# Patient Record
Sex: Female | Born: 1940
Health system: Southern US, Community
[De-identification: ages and names within clinical notes are randomized; demographics above are authoritative.]

## PROBLEM LIST (undated history)

## (undated) DIAGNOSIS — I10 Essential (primary) hypertension: Secondary | ICD-10-CM

## (undated) DIAGNOSIS — Z9289 Personal history of other medical treatment: Secondary | ICD-10-CM

## (undated) DIAGNOSIS — G459 Transient cerebral ischemic attack, unspecified: Secondary | ICD-10-CM

## (undated) DIAGNOSIS — I480 Paroxysmal atrial fibrillation: Principal | ICD-10-CM

## (undated) DIAGNOSIS — M810 Age-related osteoporosis without current pathological fracture: Secondary | ICD-10-CM

## (undated) DIAGNOSIS — I340 Nonrheumatic mitral (valve) insufficiency: Secondary | ICD-10-CM

## (undated) DIAGNOSIS — G4730 Sleep apnea, unspecified: Secondary | ICD-10-CM

## (undated) DIAGNOSIS — R42 Dizziness and giddiness: Secondary | ICD-10-CM

## (undated) DIAGNOSIS — G4700 Insomnia, unspecified: Secondary | ICD-10-CM

## (undated) DIAGNOSIS — E7800 Pure hypercholesterolemia, unspecified: Secondary | ICD-10-CM

## (undated) DIAGNOSIS — J45909 Unspecified asthma, uncomplicated: Secondary | ICD-10-CM

## (undated) HISTORY — DX: Insomnia, unspecified: G47.00

## (undated) HISTORY — DX: Nonrheumatic mitral (valve) insufficiency: I34.0

## (undated) HISTORY — DX: Unspecified asthma, uncomplicated: J45.909

## (undated) HISTORY — DX: Age-related osteoporosis without current pathological fracture: M81.0

## (undated) HISTORY — DX: Dizziness and giddiness: R42

## (undated) HISTORY — PX: ABDOMINAL HYSTERECTOMY: SHX81

## (undated) HISTORY — DX: Transient cerebral ischemic attack, unspecified: G45.9

## (undated) HISTORY — DX: Pure hypercholesterolemia, unspecified: E78.00

## (undated) HISTORY — DX: Essential (primary) hypertension: I10

## (undated) HISTORY — DX: Personal history of other medical treatment: Z92.89

## (undated) HISTORY — PX: DIAGNOSTIC LAPAROSCOPY: SUR761

## (undated) HISTORY — DX: Paroxysmal atrial fibrillation: I48.0

---

## 2002-12-28 ENCOUNTER — Encounter: Payer: Self-pay | Admitting: Emergency Medicine

## 2002-12-28 ENCOUNTER — Emergency Department (HOSPITAL_COMMUNITY): Admission: EM | Admit: 2002-12-28 | Discharge: 2002-12-28 | Payer: Self-pay | Admitting: Emergency Medicine

## 2004-08-31 ENCOUNTER — Emergency Department: Payer: Self-pay | Admitting: Emergency Medicine

## 2004-09-23 ENCOUNTER — Ambulatory Visit: Payer: Self-pay | Admitting: Family Medicine

## 2005-04-06 ENCOUNTER — Ambulatory Visit: Payer: Self-pay | Admitting: Gastroenterology

## 2005-05-11 ENCOUNTER — Emergency Department: Payer: Self-pay | Admitting: Emergency Medicine

## 2005-05-26 ENCOUNTER — Encounter: Payer: Self-pay | Admitting: Family Medicine

## 2005-05-28 ENCOUNTER — Encounter: Payer: Self-pay | Admitting: Family Medicine

## 2005-06-28 ENCOUNTER — Encounter: Payer: Self-pay | Admitting: Family Medicine

## 2005-07-29 ENCOUNTER — Encounter: Payer: Self-pay | Admitting: Family Medicine

## 2005-08-09 ENCOUNTER — Emergency Department: Payer: Self-pay | Admitting: Unknown Physician Specialty

## 2005-11-17 ENCOUNTER — Emergency Department: Payer: Self-pay | Admitting: Emergency Medicine

## 2005-11-17 ENCOUNTER — Other Ambulatory Visit: Payer: Self-pay

## 2005-12-21 ENCOUNTER — Ambulatory Visit: Payer: Self-pay | Admitting: Family Medicine

## 2005-12-28 ENCOUNTER — Ambulatory Visit: Payer: Self-pay | Admitting: Family Medicine

## 2006-08-23 ENCOUNTER — Ambulatory Visit: Payer: Self-pay | Admitting: Family Medicine

## 2007-04-14 ENCOUNTER — Ambulatory Visit: Payer: Self-pay | Admitting: Family Medicine

## 2008-01-12 ENCOUNTER — Observation Stay (HOSPITAL_COMMUNITY): Admission: EM | Admit: 2008-01-12 | Discharge: 2008-01-13 | Payer: Self-pay | Admitting: Emergency Medicine

## 2008-03-14 ENCOUNTER — Ambulatory Visit: Payer: Self-pay | Admitting: Family Medicine

## 2008-11-26 ENCOUNTER — Ambulatory Visit: Payer: Self-pay | Admitting: Family Medicine

## 2009-02-26 ENCOUNTER — Ambulatory Visit: Payer: Self-pay | Admitting: Gastroenterology

## 2009-04-10 ENCOUNTER — Emergency Department (HOSPITAL_COMMUNITY): Admission: EM | Admit: 2009-04-10 | Discharge: 2009-04-10 | Payer: Self-pay | Admitting: Emergency Medicine

## 2009-05-29 ENCOUNTER — Ambulatory Visit: Payer: Self-pay | Admitting: Family Medicine

## 2010-06-03 ENCOUNTER — Ambulatory Visit: Payer: Self-pay | Admitting: Family Medicine

## 2010-07-31 ENCOUNTER — Ambulatory Visit: Payer: Self-pay | Admitting: Family Medicine

## 2010-08-10 ENCOUNTER — Ambulatory Visit: Payer: Self-pay | Admitting: Family Medicine

## 2010-10-01 LAB — CBC
HCT: 41 % (ref 36.0–46.0)
Hemoglobin: 13.9 g/dL (ref 12.0–15.0)
MCHC: 33.8 g/dL (ref 30.0–36.0)
MCV: 92.8 fL (ref 78.0–100.0)
Platelets: 300 10*3/uL (ref 150–400)
RBC: 4.42 MIL/uL (ref 3.87–5.11)
RDW: 12.9 % (ref 11.5–15.5)
WBC: 7.9 10*3/uL (ref 4.0–10.5)

## 2010-10-01 LAB — POCT I-STAT, CHEM 8
BUN: 19 mg/dL (ref 6–23)
Calcium, Ion: 1.21 mmol/L (ref 1.12–1.32)
Chloride: 106 meq/L (ref 96–112)
Creatinine, Ser: 1 mg/dL (ref 0.4–1.2)
Glucose, Bld: 116 mg/dL — ABNORMAL HIGH (ref 70–99)
HCT: 43 % (ref 36.0–46.0)
Hemoglobin: 14.6 g/dL (ref 12.0–15.0)
Potassium: 4.3 meq/L (ref 3.5–5.1)
Sodium: 142 meq/L (ref 135–145)
TCO2: 27 mmol/L (ref 0–100)

## 2010-10-01 LAB — DIFFERENTIAL
Basophils Absolute: 0 10*3/uL (ref 0.0–0.1)
Basophils Relative: 0 % (ref 0–1)
Eosinophils Absolute: 0.2 10*3/uL (ref 0.0–0.7)
Eosinophils Relative: 3 % (ref 0–5)
Lymphocytes Relative: 27 % (ref 12–46)
Lymphs Abs: 2.1 10*3/uL (ref 0.7–4.0)
Monocytes Absolute: 0.6 10*3/uL (ref 0.1–1.0)
Monocytes Relative: 8 % (ref 3–12)
Neutro Abs: 4.9 10*3/uL (ref 1.7–7.7)
Neutrophils Relative %: 63 % (ref 43–77)

## 2010-10-01 LAB — POCT CARDIAC MARKERS
CKMB, poc: 1.5 ng/mL (ref 1.0–8.0)
Myoglobin, poc: 137 ng/mL (ref 12–200)
Troponin i, poc: <0.05 ng/mL (ref 0.00–0.09)

## 2010-11-10 NOTE — H&P (Signed)
NAMEGIANNY, [name] NO.:  [id number]   MEDICAL RECORD NO.:  [id number]          PATIENT TYPE:  INP   LOCATION:  1850                         FACILITY:  [facility]   PHYSICIAN:  Corky Crafts, MDDATE OF BIRTH:  1940-12-08   DATE OF ADMISSION:  01/12/2008  DATE OF DISCHARGE:                              HISTORY & PHYSICAL   REASON FOR ADMISSION:  Chest pain, high blood pressure, and high  cholesterol.   HISTORY OF PRESENT ILLNESS:  The patient is a 70 year old woman with  risk factor for heart disease who was sitting down earlier today and had  episode of substernal chest pressure with shortness of breath.  She then  developed dizziness.  Her symptoms lasted about 5 minutes.  She had been  feeling well other than getting fatigued when she vacuumed.  She does  not do any other strenuous exercise.  Today, she did eat a spicy chicken  nugget just before this episode.  She said she had a stress test several  years ago, which is negative.  She does not have any symptoms recently.  Currently, she is chest pain free.   PAST MEDICAL HISTORY:  1. Hypertension.  2. High cholesterol.  3. Osteoporosis.  4. Hiatal hernia.   PAST SURGICAL HISTORY:  Hysterectomy, bladder and bowel obstruction.   ALLERGIES:  PENICILLIN.   MEDICATIONS:  1. Accupril 40 mg a day.  2. Norvasc.  3. HCTZ, unknown dosage.  4. Aspirin 81 mg a day.  5. Symbicort, unknown dose.   SOCIAL HISTORY:  The patient does not smoke, drink, or use caffeine.  She is retired.  She lives alone.   FAMILY HISTORY:  No early coronary artery disease.   REVIEW OF SYSTEMS:  Significant for left knee pain, which she has been  told that is related to arthritis.  No swelling.  No exertional chest  pain.  No fevers, chills, weight loss.  No focal weakness.  No edema.  All systems are negative.   PHYSICAL EXAM:  GENERAL:  She is awake and alert, in no apparent  distress.  VITAL SIGNS:  Blood pressure 140/70  and pulse 66.  NECK:  No JVD.  No carotid bruits.  CARDIOVASCULAR:  Regular rate and rhythm.  S1 and S2.  LUNGS:  Clear to auscultation bilaterally.  ABDOMEN:  Soft, nontender, and nondistended.  EXTREMITIES:  Shows no edema.  NEURO:  No focal deficits.  SKIN:  No rash.   LAB WORK:  Shows point-of-care troponins are negative x3.  EKG shows  normal sinus rhythm with no significant ST-T wave changes.  Creatinine  1.2.   ASSESSMENT AND PLAN:  A 70 year old with risk factors who had chest  pain.  1. Will rule out for myocardial infarction with enzymesand watch her      on telemetry. Continue aspirin.  2. Since she is pain free, we will not start any IV nitroglycerin at      this time.  We will start if pain persists.  3. Heparin only if her enzymes become positive.  4. She rules out plan for a stress test, possibly  as an outpatient      depending on her symptoms.      Corky Crafts, MD  Electronically Signed     JSV/MEDQ  D:  01/12/2008  T:  01/13/2008  Job:  [id number]

## 2011-03-26 LAB — TROPONIN I

## 2011-03-26 LAB — CBC
HCT: 43.2
Hemoglobin: 14.7
MCHC: 34
MCV: 91.9
Platelets: 309
RBC: 4.7
RDW: 13.1
WBC: 7.9

## 2011-03-26 LAB — DIFFERENTIAL
Basophils Absolute: 0
Basophils Relative: 1
Eosinophils Absolute: 0.2
Eosinophils Relative: 2
Lymphocytes Relative: 29
Lymphs Abs: 2.3
Monocytes Absolute: 0.9
Monocytes Relative: 11
Neutro Abs: 4.5
Neutrophils Relative %: 57

## 2011-03-26 LAB — POCT CARDIAC MARKERS
CKMB, poc: 2.3
CKMB, poc: 2.5
Myoglobin, poc: 139
Myoglobin, poc: 146
Operator id: 234501
Operator id: 272551
Troponin i, poc: <0.05
Troponin i, poc: <0.05

## 2011-03-26 LAB — POCT I-STAT, CHEM 8
BUN: 18
Calcium, Ion: 1.22
Chloride: 105
Creatinine, Ser: 1.1
Glucose, Bld: 84
HCT: 45
Hemoglobin: 15.3 — ABNORMAL HIGH
Potassium: 3.7
Sodium: 140
TCO2: 28

## 2011-03-26 LAB — CK TOTAL AND CKMB (NOT AT ARMC)
CK, MB: 1.7
CK, MB: 2.2
Relative Index: 1.2
Relative Index: 1.3
Total CK: 143
Total CK: 172

## 2011-09-09 ENCOUNTER — Ambulatory Visit: Payer: Self-pay | Admitting: Family Medicine

## 2012-11-14 ENCOUNTER — Ambulatory Visit: Payer: Self-pay | Admitting: Family Medicine

## 2012-12-25 ENCOUNTER — Observation Stay: Payer: Self-pay | Admitting: Internal Medicine

## 2012-12-25 LAB — CBC
HCT: 45.7 %
HGB: 15.8 g/dL
MCH: 30.8 pg
MCHC: 34.6 g/dL
MCV: 89 fL
Platelet: 325 10*3/uL
RBC: 5.13 X10 6/mm 3
RDW: 13.3 %
WBC: 8.9 10*3/uL

## 2012-12-25 LAB — BASIC METABOLIC PANEL WITH GFR
Anion Gap: 6 — ABNORMAL LOW
BUN: 30 mg/dL — ABNORMAL HIGH
Calcium, Total: 9.8 mg/dL
Chloride: 104 mmol/L
Co2: 27 mmol/L
Creatinine: 1.1 mg/dL
EGFR (African American): 59 — ABNORMAL LOW
EGFR (Non-African Amer.): 51 — ABNORMAL LOW
Glucose: 98 mg/dL
Osmolality: 280
Potassium: 3.7 mmol/L
Sodium: 137 mmol/L

## 2013-02-15 ENCOUNTER — Encounter: Payer: Self-pay | Admitting: *Deleted

## 2013-02-23 ENCOUNTER — Ambulatory Visit (INDEPENDENT_AMBULATORY_CARE_PROVIDER_SITE_OTHER): Payer: Medicare Other | Admitting: Cardiovascular Disease

## 2013-02-23 ENCOUNTER — Encounter: Payer: Self-pay | Admitting: Cardiovascular Disease

## 2013-02-23 VITALS — BP 110/80 | HR 52 | Ht 62.0 in | Wt 178.0 lb

## 2013-02-23 DIAGNOSIS — R0609 Other forms of dyspnea: Secondary | ICD-10-CM

## 2013-02-23 DIAGNOSIS — R0600 Dyspnea, unspecified: Secondary | ICD-10-CM

## 2013-02-23 DIAGNOSIS — R001 Bradycardia, unspecified: Secondary | ICD-10-CM

## 2013-02-23 DIAGNOSIS — R9431 Abnormal electrocardiogram [ECG] [EKG]: Secondary | ICD-10-CM | POA: Insufficient documentation

## 2013-02-23 DIAGNOSIS — I498 Other specified cardiac arrhythmias: Secondary | ICD-10-CM

## 2013-02-23 NOTE — Assessment & Plan Note (Signed)
Other than fatigue, she does not appear to be particularly symptomatic from this. I reviewed her recent labs which were unremarkable including normal thyroid function and BNP. I recommend avoiding medications that can cause bradycardia. No indication to treat this unless symptomatic. Rule out underlying ischemic cause. Heart rate response to exercise will be evaluated as well during stress test.

## 2013-02-23 NOTE — Progress Notes (Signed)
HPI  This is a pleasant 72 year old female who was referred by Dr. Thana Ates for evaluation of an abnormal EKG and bradycardia. She has no previous cardiac history. She has prolonged history of hypertension well controlled on medications. She also has history of mild hyperlipidemia. There is no history of tobacco use and a family history of premature coronary artery disease. She was hospitalized in June of this year due to angioedema thought to be related to ACE inhibitor. She was taken off the medication and continued on hydrochlorothiazide and amlodipine. Upon followup, she was noted to be bradycardic with a heart rate of 46 beats per minute. The patient is not on any medication that can cause bradycardia. She denies any chest discomfort. However, she has noticed progressive exertional fatigue with mild dyspnea but no chest discomfort. She denies any dizziness, syncope or presyncope.  Allergies  Allergen Reactions  . Ace Inhibitors   . Penicillins      Current Outpatient Prescriptions on File Prior to Visit  Medication Sig Dispense Refill  . albuterol (PROAIR HFA) 108 (90 BASE) MCG/ACT inhaler Inhale 2 puffs into the lungs every 6 (six) hours as needed for wheezing.      [name] amLODipine (NORVASC) 10 MG tablet Take 10 mg by mouth daily.      [name] aspirin 81 MG tablet Take 81 mg by mouth daily.      . budesonide-formoterol (SYMBICORT) 160-4.5 MCG/ACT inhaler Inhale 2 puffs into the lungs 2 (two) times daily.      . hydrochlorothiazide (HYDRODIURIL) 25 MG tablet Take 25 mg by mouth daily.      . meclizine (ANTIVERT) 25 MG tablet Take 25 mg by mouth every 8 (eight) hours as needed.       No current facility-administered medications on file prior to visit.     Past Medical History  Diagnosis Date  . Hypertension   . Unspecified transient cerebral ischemia   . Acute bronchitis   . Pure hypercholesterolemia   . Osteoporosis, unspecified   . Unspecified asthma(493.90)   . Insomnia,  unspecified   . Chronic kidney disease (CKD), stage III (moderate)      Past Surgical History  Procedure Laterality Date  . Abdominal hysterectomy       Family History  Problem Relation Age of Onset  . COPD Father   . Hypertension Mother   . Hypertension Brother      History   Social History  . Marital Status: Single    Spouse Name: N/A    Number of Children: N/A  . Years of Education: N/A   Occupational History  . Not on file.   Social History Main Topics  . Smoking status: Never Smoker   . Smokeless tobacco: Not on file  . Alcohol Use: No  . Drug Use: No  . Sexual Activity: Not on file   Other Topics Concern  . Not on file   Social History Narrative  . No narrative on file     ROS Constitutional: Negative for fever, chills, diaphoresis, activity change, appetite change . HENT: Negative for hearing loss, nosebleeds, congestion, sore throat, facial swelling, drooling, trouble swallowing, neck pain, voice change, sinus pressure and tinnitus.  Eyes: Negative for photophobia, pain, discharge and visual disturbance.  Respiratory: Negative for apnea, cough, chest tightness and wheezing.  Cardiovascular: Negative for chest pain, palpitations and leg swelling.  Gastrointestinal: Negative for nausea, vomiting, abdominal pain, diarrhea, constipation, blood in stool and abdominal distention.  Genitourinary: Negative for dysuria,  urgency, frequency, hematuria and decreased urine volume.  Musculoskeletal: Negative for myalgias, back pain, joint swelling, arthralgias and gait problem.  Skin: Negative for color change, pallor, rash and wound.  Neurological: Negative for dizziness, tremors, seizures, syncope, speech difficulty, weakness, light-headedness, numbness and headaches.  Psychiatric/Behavioral: Negative for suicidal ideas, hallucinations, behavioral problems and agitation. The patient is not nervous/anxious.   '  PHYSICAL EXAM   BP 110/80  Pulse 52  Ht 5\' 2"   (1.575 m)  Wt 178 lb (80.74 kg)  BMI 32.55 kg/m2 Constitutional: She is oriented to person, place, and time. She appears well-developed and well-nourished. No distress.  HENT: No nasal discharge.  Head: Normocephalic and atraumatic.  Eyes: Pupils are equal and round. Right eye exhibits no discharge. Left eye exhibits no discharge.  Neck: Normal range of motion. Neck supple. No JVD present. No thyromegaly present.  Cardiovascular: Normal rate, regular rhythm, normal heart sounds. Exam reveals no gallop and no friction rub. No murmur heard.  Pulmonary/Chest: Effort normal and breath sounds normal. No stridor. No respiratory distress. She has no wheezes. She has no rales. She exhibits no tenderness.  Abdominal: Soft. Bowel sounds are normal. She exhibits no distension. There is no tenderness. There is no rebound and no guarding.  Musculoskeletal: Normal range of motion. She exhibits no edema and no tenderness.  Neurological: She is alert and oriented to person, place, and time. Coordination normal.  Skin: Skin is warm and dry. No rash noted. She is not diaphoretic. No erythema. No pallor.  Psychiatric: She has a normal mood and affect. Her behavior is normal. Judgment and thought content normal.     GNF:AOZHY  Bradycardia  -Anteroseptal infarct -age undetermined.   ABNORMAL    ASSESSMENT AND PLAN

## 2013-02-23 NOTE — Patient Instructions (Addendum)
Your physician has requested that you have en exercise stress myoview. For further information please visit [email or web address] Please follow instruction sheet, as given.  Follow up as needed.   [facility] MYOVIEW  Your caregiver has ordered a Stress Test with nuclear imaging. The purpose of this test is to evaluate the blood supply to your heart muscle. This procedure is referred to as a "Non-Invasive Stress Test." This is because other than having an IV started in your vein, nothing is inserted or "invades" your body. Cardiac stress tests are done to find areas of poor blood flow to the heart by determining the extent of coronary artery disease (CAD). Some patients exercise on a treadmill, which naturally increases the blood flow to your heart, while others who are  unable to walk on a treadmill due to physical limitations have a pharmacologic/chemical stress agent called Lexiscan . This medicine will mimic walking on a treadmill by temporarily increasing your coronary blood flow.   Please note: these test may take anywhere between 2-4 hours to complete  PLEASE REPORT TO [facility] MEDICAL MALL ENTRANCE  THE VOLUNTEERS AT THE FIRST DESK WILL DIRECT YOU WHERE TO GO  Date of Procedure:__________Wednesday, September 3___________________________  Arrival Time for Procedure:__________7:15____________________  Instructions regarding medication:   PLEASE NOTIFY THE OFFICE AT LEAST 24 HOURS IN ADVANCE IF YOU ARE UNABLE TO KEEP YOUR APPOINTMENT.  [phone] AND  PLEASE NOTIFY NUCLEAR MEDICINE AT [facility] AT LEAST 24 HOURS IN ADVANCE IF YOU ARE UNABLE TO KEEP YOUR APPOINTMENT. [phone]  How to prepare for your Myoview test:  1. Do not eat or drink after midnight 2. No caffeine for 24 hours prior to test 3. No smoking 24 hours prior to test. 4. Your medication may be taken with water.  If your doctor stopped a medication because of this test, do not take that medication. 5. Ladies, please do not wear  dresses.  Skirts or pants are appropriate. Please wear a short sleeve shirt. 6. No perfume, cologne or lotion. 7. Wear comfortable walking shoes. No heels!

## 2013-02-23 NOTE — Assessment & Plan Note (Signed)
The patient's EKG is suggestive of prior anteroseptal infarct with prominent Q waves in V1 and V2 and nonspecific ST changes. Her symptoms include exertional fatigue and mild dyspnea without chest pain. I recommend further evaluation with a treadmill nuclear stress test. Given her abnormal resting EKG, I don't think a treadmill stress test done on would be sufficient.

## 2013-02-28 ENCOUNTER — Ambulatory Visit: Payer: Self-pay | Admitting: Cardiovascular Disease

## 2013-03-01 ENCOUNTER — Other Ambulatory Visit: Payer: Self-pay

## 2013-03-01 DIAGNOSIS — R0600 Dyspnea, unspecified: Secondary | ICD-10-CM

## 2013-03-01 DIAGNOSIS — R9431 Abnormal electrocardiogram [ECG] [EKG]: Secondary | ICD-10-CM

## 2013-03-02 ENCOUNTER — Telehealth: Payer: Self-pay

## 2013-03-02 NOTE — Telephone Encounter (Signed)
Spoke w/ pt.  She is aware of results and will continue exercising.

## 2013-03-02 NOTE — Telephone Encounter (Signed)
Message copied by Marilynne Halsted on Fri Mar 02, 2013 11:32 AM ------      Message from: Lorine Bears A      Created: Fri Mar 02, 2013  9:48 AM       Inform patient that  stress test was normal.       Heart rate response to exercise was normal.       Send a copy to Dr. Thana Ates. ------

## 2013-03-02 NOTE — Telephone Encounter (Signed)
Message copied by Marilynne Halsted on Fri Mar 02, 2013  4:38 PM ------      Message from: Lorine Bears A      Created: Fri Mar 02, 2013  9:48 AM       Inform patient that  stress test was normal.       Heart rate response to exercise was normal.       Send a copy to Dr. Thana Ates. ------

## 2013-05-16 ENCOUNTER — Ambulatory Visit: Payer: Self-pay | Admitting: Family Medicine

## 2013-05-21 ENCOUNTER — Ambulatory Visit: Payer: Self-pay | Admitting: Family Medicine

## 2013-12-18 ENCOUNTER — Ambulatory Visit: Payer: Self-pay | Admitting: Family Medicine

## 2014-08-22 DIAGNOSIS — Z23 Encounter for immunization: Secondary | ICD-10-CM | POA: Diagnosis not present

## 2014-10-11 ENCOUNTER — Other Ambulatory Visit: Payer: Self-pay | Admitting: Family Medicine

## 2014-10-11 DIAGNOSIS — Z1231 Encounter for screening mammogram for malignant neoplasm of breast: Secondary | ICD-10-CM

## 2014-10-18 NOTE — Discharge Summary (Signed)
PATIENT NAME:  [name], DORIAN MR#:  [id number] DATE OF BIRTH:  1941/03/02  DATE OF ADMISSION:  12/25/2012 DATE OF DISCHARGE:  12/26/2012  ADMITTING PHYSICIAN: Gladstone Lighter, MD.  DISCHARGING PHYSICIAN: Dr. Gladstone Lighter, M.D.   PRIMARY CARE PHYSICIAN: Dr. Ashok Norris.  Mead: None.   DISCHARGE DIAGNOSES:  1.  Angioedema likely secondary to quinapril.  2.  Hypertension.   DISCHARGE HOME MEDICATIONS:  1.  Hydrochlorothiazide 25 mg p.o. daily.  2.  Norvasc 10 mg p.o. daily.  3.  Benadryl 25 mg p.o. q.6 hours p.r.n. for itching and allergic reaction.  4.  Prednisone taper.   DISCHARGE DIET: Low-sodium diet.   DISCHARGE ACTIVITY: As tolerated.    FOLLOWUP INSTRUCTIONS: Advised to stop taking quinapril or other ACE inhibitors and advised to follow up with PCP and 5 to 6 days.   LABS AND IMAGING STUDIES PRIOR TO DISCHARGE: WBC 8.9, hemoglobin is 15.8, hematocrit 45.7, platelet count 325. Sodium 137, potassium 3.7, chloride 104, bicarbonate 27, BUN 30, creatinine 1.1, glucose 98 and calcium of 9.8.  BRIEF HOSPITAL COURSE: Ms. [name] is a 74 year old African American female with past medical history significant for hypertension, who had been on quinapril for more than 10 years now. She woke up with severe tongue swelling, which got worse to the point that she could not breathe or  could not swallow and came to the ER.  1.  Angioedema likely secondary to quinapril. She was given Solu-Medrol. Her symptoms started improving. She was monitored to the in CCU. She refused any fresh frozen plasma and since her symptoms were improving it was not give either way. She was on Pepcid, Benadryl p.r.n. and also Solu-Medrol while in the hospital. Her quinapril was stopped and she was advised not to take any ACE inhibitors. Her blood pressure was better controlled on Norvasc and hydrochlorothiazide, so she is being discharged on the 2 and will follow up with her PCP in  the next week Her course has been otherwise uneventful in the hospital. Her swelling improved. She was initially only on a liquid diet and she is able to tolerate a regular diet and ambulate. No stridor. No dysphagia and is being discharged home.   DISCHARGE CONDITION: Stable.   DISCHARGE DISPOSITION: Home.   TIME SPENT ON DISCHARGE: 40 minutes.   ____________________________ Gladstone Lighter, MD rk:aw D: 12/26/2012 15:09:15 ET T: 12/26/2012 15:33:17 ET JOB#: [id number]  cc: Gladstone Lighter, MD, <Dictator> Ashok Norris, MD Gladstone Lighter MD ELECTRONICALLY SIGNED 01/22/2013 13:16

## 2014-10-18 NOTE — H&P (Signed)
PATIENT NAME:  [name], [name] MR#:  [id number] DATE OF BIRTH:  05/25/1941  DATE OF ADMISSION:  12/25/2012  ADMITTING PHYSICIAN:  Gladstone Lighter,  MD   PRIMARY CARE PHYSICIAN:  Ashok Norris, MD  CHIEF COMPLAINT:  Swelling of her throat and tongue.   HISTORY OF PRESENT ILLNESS:  [name] is a 74 year old African American female with a past medical history significant for hypertension who has been taking Norvasc, hydrochlorothiazide and quinapril for several years now comes to the hospital secondary to noticing swelling of her throat and tongue this morning. The patient denies any recent travel, eating any seafood or any outside food yesterday. She felt fine all day yesterday and went to bed. This morning, she woke up and felt her mouth was odd and her tongue was swollen. She went for her 1 mile walk. She came back, tried to drink something and she felt that she could not swallow due to her tongue being extremely swollen. Her lips were very mildly swollen. At the time, she was concerned and comes to the ER. Blood work looks good. Likely she has angioedema related to her ACE inhibitor that she has been on and she is being admitted under observation for the same.   PAST MEDICAL HISTORY:  Hypertension.   PAST SURGICAL HISTORY:  Hysterectomy.   ALLERGIES TO MEDICATIONS:   1.  PENICILLIN.  2.  ACE INHIBITORS FROM TODAY.  CURRENT HOME MEDICATIONS:   1.  Norvasc 10 mg p.o. daily.  2.  HCTZ 25 mg p.o. daily.  3.  Quinapril 40 mg p.o. daily.   SOCIAL HISTORY:  Lives at home by herself. Active at baseline. No history of any smoking or alcohol use.   FAMILY HISTORY:  Dad died from COPD complications and respiratory failure. Unable to be weaned off vent after surgery for his gallbladder and family history of stroke.   REVIEW OF SYSTEMS:  CONSTITUTIONAL: No fatigue, fever or weakness.  EYES: No blurred vision, double vision, glaucoma or cataracts. Uses reading glasses.  ENT: No tinnitus,  ear pain, hearing loss, epistaxis or discharge. Mild dysphagia this morning. No stridor noted.  RESPIRATORY: No cough, wheeze, hemoptysis or COPD.  CARDIOVASCULAR: No chest pain, orthopnea, edema, arrhythmia, palpitations or syncope.  GASTROINTESTINAL: No nausea, vomiting, diarrhea, abdominal pain, hematemesis or melena.  GENITOURINARY: No dysuria, hematuria, renal calculus, frequency or incontinence.  ENDOCRINE: No polyuria, nocturia, thyroid problems, heat or cold intolerance.  HEMATOLOGY: No anemia, easy bruising or bleeding.  SKIN: No acne, rash or lesions.  MUSCULOSKELETAL: No neck, back or shoulder pain, no arthritis or gout.  NEUROLOGIC: No numbness, weakness, CVA, TIA or seizures.  PSYCHOLOGICAL: No anxiety, insomnia, depression.   PHYSICAL EXAMINATION: VITAL SIGNS: Temperature 97.9 degrees Fahrenheit, pulse 92, respirations 18, blood pressure 136/71, pulse oximetry 97% on room air.  GENERAL: Well-built, well-nourished female sitting in bed, not in any acute distress.  HEENT: Normocephalic, atraumatic. Pupils equal, round, reacting to light. Anicteric sclerae. Extraocular movements intact. Oropharynx with still a large tongue, but posterior pharyngeal wall, the soft palate and uvula do not look edematous at this time. Lips also do not look edematous at this time. No erythema, mass or exudates.  NECK: Supple, no thyromegaly, JVD or carotid bruits. No lymphadenopathy. No stridor on exam.  LUNGS: Moving air bilaterally. No wheeze or crackles. No use of accessory muscles for breathing. Clear breath sounds bilaterally.  CARDIOVASCULAR: S1, S2 regular rate and rhythm. No murmurs, rubs or gallops.  ABDOMEN: Soft, nontender, nondistended. No hepatosplenomegaly.  Normal bowel sounds.  EXTREMITIES: No pedal edema. No clubbing or cyanosis, 2+ dorsalis pedis pulses palpable bilaterally.  SKIN: No acne, rash or lesions.  MUSCULOSKELETAL: No joint tenderness or effusion.  NEUROLOGIC: Cranial nerves  intact, 2+ deep tendon reflexes symmetrically both upper and lower extremities present. Strength is 5 out of 5 in all 4 extremities and sensation is intact.  PSYCHOLOGICAL: The patient is awake, alert, oriented x 3.   DIAGNOSTIC DATA:   1.  WBC 8.9, hemoglobin 15.8, hematocrit 45.7, platelet count 325.  2.  Sodium 139, potassium 3.7, chloride 104, bicarbonate 27, BUN 30, creatinine 1.1, glucose 98 and calcium of 9.8.  3.  EKG normal sinus rhythm.   ASSESSMENT AND PLAN:  A 74 year old female with a history of hypertension admitted for angioedema.  1.  Angioedema likely from her Quinapril, although she has been taking it for several years now. start on intravenous Solu-Medrol, Benadryl and Pepcid and observe for 24 hours. This is for histamine-related angioedema. If it is purely ACEI related edema, the above medications might not help and fresh frozen plasma will help because of the ACE enzyme it carries and decreases the bradykinins. However, since her swelling is not progressing and the patient does not want to get any plasma products at this time, we will monitor for now and hold off on transfusing fresh frozen plasma. She is on a clear-liquid diet and advanced to full liquid later today and if her swelling improves, we will change to soft diet tomorrow.  2.  Hypertension. Hold her quinapril. Continue Norvasc and hydrochlorothiazide.  3.  Gastrointestinal and deep vein thrombosis prophylaxis on Protonix and Lovenox.   CODE STATUS:  Full code.   TIME SPENT ON ADMISSION:  50 minutes.    ____________________________ Gladstone Lighter, MD rk:si D: 12/25/2012 15:51:00 ET T: 12/25/2012 16:32:46 ET JOB#: [id number]  cc: Gladstone Lighter, MD, <Dictator> Ashok Norris, MD Gladstone Lighter MD ELECTRONICALLY SIGNED 01/22/2013 13:16

## 2014-10-29 DIAGNOSIS — Z87448 Personal history of other diseases of urinary system: Secondary | ICD-10-CM | POA: Diagnosis not present

## 2014-10-29 DIAGNOSIS — R7309 Other abnormal glucose: Secondary | ICD-10-CM | POA: Diagnosis not present

## 2014-10-29 DIAGNOSIS — E780 Pure hypercholesterolemia: Secondary | ICD-10-CM | POA: Diagnosis not present

## 2014-10-29 DIAGNOSIS — I10 Essential (primary) hypertension: Secondary | ICD-10-CM | POA: Diagnosis not present

## 2014-12-20 ENCOUNTER — Other Ambulatory Visit: Payer: Self-pay | Admitting: Family Medicine

## 2014-12-20 ENCOUNTER — Ambulatory Visit
Admission: RE | Admit: 2014-12-20 | Discharge: 2014-12-20 | Disposition: A | Discharge status: Home / Self Care | Payer: Medicare Other | Source: Ambulatory Visit | Attending: Family Medicine | Admitting: Family Medicine

## 2014-12-20 DIAGNOSIS — Z1231 Encounter for screening mammogram for malignant neoplasm of breast: Secondary | ICD-10-CM

## 2014-12-27 ENCOUNTER — Telehealth: Payer: Self-pay | Admitting: Family Medicine

## 2014-12-27 NOTE — Telephone Encounter (Signed)
Pt called to schedule an appt to discuss her dense breast tissue, however she began as a [name] pt, but has seen Nadine Counts a few times per pt and has a follow up scheduled in September with Sundaram. Has Dr. Nadine Counts accepted [name] as her pt and who should I schedule the exam for the breast tissue with. Please advise. Pt stated that she was fine either way.

## 2014-12-27 NOTE — Telephone Encounter (Signed)
If Mrs [name] would like to switch her PCP from Dr. Rutherford Nail to me please go ahead and schedule her with me and list the PCP on file to Dr. Nadine Counts. Thank you.

## 2014-12-31 NOTE — Telephone Encounter (Signed)
Please call pt. Thank you.

## 2015-01-02 ENCOUNTER — Encounter: Payer: Self-pay | Admitting: Family Medicine

## 2015-01-02 ENCOUNTER — Ambulatory Visit (INDEPENDENT_AMBULATORY_CARE_PROVIDER_SITE_OTHER): Payer: Medicare Other | Admitting: Family Medicine

## 2015-01-02 VITALS — BP 130/70 | HR 96 | Temp 97.8°F | Resp 16 | Ht 61.0 in | Wt 180.4 lb

## 2015-01-02 DIAGNOSIS — R9230 Dense breasts, unspecified: Secondary | ICD-10-CM | POA: Insufficient documentation

## 2015-01-02 DIAGNOSIS — R922 Inconclusive mammogram: Secondary | ICD-10-CM | POA: Diagnosis not present

## 2015-01-02 DIAGNOSIS — K635 Polyp of colon: Secondary | ICD-10-CM | POA: Diagnosis not present

## 2015-01-02 NOTE — Progress Notes (Signed)
Name: [name]   MRN: [id number]    DOB: March 12, 1941   Date:01/02/2015       Progress Note  Subjective  Chief Complaint  Chief Complaint  Patient presents with  . Breast Problem    discuss mammogram findings    HPI  Dense breast  Patient has a history of a mammograms from breast densities. The radiologist states that this may height some underlying breast disease. She has a bit of a concern about possibility of being some underlying wrist lesion. There is a family history of bruits densities but no breast cancer. Approximately 15-20 minutes was spent discussing this with patient.  Colonic polyps  Patient has a history of colonoscopy in 2000 and the right showing poor colonic polyps as well as diverticulosis. She currently has no abdominal pain no weight loss no melena or hematochezia.  Past Medical History  Diagnosis Date  . Hypertension   . Unspecified transient cerebral ischemia   . Acute bronchitis   . Pure hypercholesterolemia   . Osteoporosis, unspecified   . Unspecified asthma(493.90)   . Insomnia, unspecified   . Chronic kidney disease (CKD), stage III (moderate)     History  Substance Use Topics  . Smoking status: Never Smoker   . Smokeless tobacco: Never Used  . Alcohol Use: No     Current outpatient prescriptions:  .  amLODipine (NORVASC) 10 MG tablet, Take 10 mg by mouth daily., Disp: , Rfl:  .  aspirin 81 MG tablet, Take 81 mg by mouth daily., Disp: , Rfl:  .  hydrochlorothiazide (HYDRODIURIL) 25 MG tablet, Take 25 mg by mouth daily., Disp: , Rfl:  .  meclizine (ANTIVERT) 25 MG tablet, Take 25 mg by mouth every 8 (eight) hours as needed., Disp: , Rfl:  .  albuterol (PROAIR HFA) 108 (90 BASE) MCG/ACT inhaler, Inhale 2 puffs into the lungs every 6 (six) hours as needed for wheezing., Disp: , Rfl:  .  budesonide-formoterol (SYMBICORT) 160-4.5 MCG/ACT inhaler, Inhale 2 puffs into the lungs 2 (two) times daily., Disp: , Rfl:   Allergies  Allergen  Reactions  . Ace Inhibitors   . Penicillins Anxiety    Review of Systems  Constitutional: Negative for fever, chills and weight loss.  HENT: Negative for congestion, hearing loss, sore throat and tinnitus.   Eyes: Negative for blurred vision, double vision and redness.  Respiratory: Negative for cough, hemoptysis and shortness of breath.   Cardiovascular: Positive for chest pain (patient complains ofsome occasional sharp discomfort in the breast. There is a history of density of the mammogram. There is no family history of breast cancer.). Negative for palpitations, orthopnea, claudication and leg swelling.  Gastrointestinal: Negative for heartburn, nausea, vomiting, diarrhea, constipation and blood in stool.       History of colonic polyp  Genitourinary: Negative for dysuria, urgency, frequency and hematuria.  Musculoskeletal: Negative for myalgias, back pain, joint pain, falls and neck pain.  Skin: Negative for itching.  Neurological: Negative for dizziness, tingling, tremors, focal weakness, seizures, loss of consciousness, weakness and headaches.  Endo/Heme/Allergies: Does not bruise/bleed easily.  Psychiatric/Behavioral: Negative for depression and substance abuse. The patient is not nervous/anxious and does not have insomnia.      Objective  Filed Vitals:   01/02/15 1501  BP: 130/70  Pulse: 96  Temp: 97.8 F (36.6 C)  TempSrc: Oral  Resp: 16  Height: 5\' 1"  (1.549 m)  Weight: 180 lb 6.4 oz (81.829 kg)  SpO2: 96%  Physical Exam  Constitutional: She is well-developed, well-nourished, and in no distress.  HENT:  Head: Normocephalic.  Eyes: Pupils are equal, round, and reactive to light.  Psychiatric: Mood, memory, affect and judgment normal.      Assessment & Plan  1. Inconclusive mammogram due to dense breasts Approximately 20-30 minutes spent in discussion regarding the mammogram and colonic polyp history  2. Dense breast Reassurance of benign nature  3.  Colonic polyp Scheduled for repeat colonoscopy

## 2015-01-02 NOTE — Patient Instructions (Signed)
*  Healthy as very. We will: R days before the 11th He is In log book to female was seen couple weeks is instructed Obesity Obesity is defined as having too much total body fat and a body mass index (BMI) of 30 or more. BMI is an estimate of body fat and is calculated from your height and weight. Obesity happens when you consume more calories than you can burn by exercising or performing daily physical tasks. Prolonged obesity can cause major illnesses or emergencies, such as:   Stroke.  Heart disease.  Diabetes.  Cancer.  Arthritis.  High blood pressure (hypertension).  High cholesterol.  Sleep apnea.  Erectile dysfunction.  Infertility problems. CAUSES   Regularly eating unhealthy foods.  Physical inactivity.  Certain disorders, such as an underactive thyroid (hypothyroidism), Cushing's syndrome, and polycystic ovarian syndrome.  Certain medicines, such as steroids, some depression medicines, and antipsychotics.  Genetics.  Lack of sleep. DIAGNOSIS  A health care provider can diagnose obesity after calculating your BMI. Obesity will be diagnosed if your BMI is 30 or higher.  There are other methods of measuring obesity levels. Some other methods include measuring your skinfold thickness, your waist circumference, and comparing your hip circumference to your waist circumference. TREATMENT  A healthy treatment program includes some or all of the following:  Long-term dietary changes.  Exercise and physical activity.  Behavioral and lifestyle changes.  Medicine only under the supervision of your health care provider. Medicines may help, but only if they are used with diet and exercise programs. An unhealthy treatment program includes:  Fasting.  Fad diets.  Supplements and drugs. These choices do not succeed in long-term weight control.  HOME CARE INSTRUCTIONS   Exercise and perform physical activity as directed by your health care provider. To increase physical  activity, try the following:  Use stairs instead of elevators.  Park farther away from store entrances.  Garden, bike, or walk instead of watching television or using the computer.  Eat healthy, low-calorie foods and drinks on a regular basis. Eat more fruits and vegetables. Use low-calorie cookbooks or take healthy cooking classes.  Limit fast food, sweets, and processed snack foods.  Eat smaller portions.  Keep a daily journal of everything you eat. There are many free websites to help you with this. It may be helpful to measure your foods so you can determine if you are eating the correct portion sizes.  Avoid drinking alcohol. Drink more water and drinks without calories.  Take vitamins and supplements only as recommended by your health care provider.  Weight-loss support groups, Tax adviser, counselors, and stress reduction education can also be very helpful. SEEK IMMEDIATE MEDICAL CARE IF:  You have chest pain or tightness.  You have trouble breathing or feel short of breath.  You have weakness or leg numbness.  You feel confused or have trouble talking.  You have sudden changes in your vision. MAKE SURE YOU:  Understand these instructions.  Will watch your condition.  Will get help right away if you are not doing well or get worse. Document Released: 07/22/2004 Document Revised: 10/29/2013 Document Reviewed: 07/21/2011 [facility] Patient Information 2015 [place], [place]. This information is not intended to replace advice given to you by your health care provider. Make sure you discuss any questions you have with your health care provider.

## 2015-01-03 NOTE — Telephone Encounter (Signed)
Spoke with the patient and she will let me know which provider she will stay with.

## 2015-01-09 ENCOUNTER — Telehealth: Payer: Self-pay | Admitting: Gastroenterology

## 2015-01-09 ENCOUNTER — Other Ambulatory Visit: Payer: Self-pay

## 2015-01-09 NOTE — Telephone Encounter (Signed)
Gastroenterology Pre-Procedure Review  Request Date: 01-27-2015 Requesting Physician: Dr. Allen Norris  PATIENT REVIEW QUESTIONS: The patient responded to the following health history questions as indicated:    1. Are you having any GI issues? no 2. Do you have a personal history of Polyps? yes (Polyps) 3. Do you have a family history of Colon Cancer or Polyps? yes (DAD) 4. Diabetes Mellitus? no 5. Joint replacements in the past 12 months?no 6. Major health problems in the past 3 months?no 7. Any artificial heart valves, MVP, or defibrillator?no    MEDICATIONS & ALLERGIES:    Patient reports the following regarding taking any anticoagulation/antiplatelet therapy:   Plavix, Coumadin, Eliquis, Xarelto, Lovenox, Pradaxa, Brilinta, or Effient? no Aspirin? yes (Daily 81mg )  Patient confirms/reports the following medications:  Current Outpatient Prescriptions  Medication Sig Dispense Refill   albuterol (PROAIR HFA) 108 (90 BASE) MCG/ACT inhaler Inhale 2 puffs into the lungs every 6 (six) hours as needed for wheezing.     amLODipine (NORVASC) 10 MG tablet Take 10 mg by mouth daily.     aspirin 81 MG tablet Take 81 mg by mouth daily.     budesonide-formoterol (SYMBICORT) 160-4.5 MCG/ACT inhaler Inhale 2 puffs into the lungs 2 (two) times daily.     hydrochlorothiazide (HYDRODIURIL) 25 MG tablet Take 25 mg by mouth daily.     meclizine (ANTIVERT) 25 MG tablet Take 25 mg by mouth every 8 (eight) hours as needed.     No current facility-administered medications for this visit.    Patient confirms/reports the following allergies:  Allergies  Allergen Reactions   Ace Inhibitors    Penicillins Anxiety    No orders of the defined types were placed in this encounter.    AUTHORIZATION INFORMATION Primary Insurance: 1D#: Group #:  Secondary Insurance: 1D#: Group #:  SCHEDULE INFORMATION: Date: 01-27-2015 Time: Location:[facility]

## 2015-01-22 NOTE — Discharge Instructions (Signed)

## 2015-01-23 ENCOUNTER — Other Ambulatory Visit: Payer: Self-pay

## 2015-01-27 ENCOUNTER — Ambulatory Visit: Admission: RE | Admit: 2015-01-27 | Payer: Medicare Other | Source: Ambulatory Visit | Admitting: Gastroenterology

## 2015-01-27 ENCOUNTER — Encounter: Admission: RE | Payer: Self-pay | Source: Ambulatory Visit

## 2015-01-27 SURGERY — COLONOSCOPY WITH PROPOFOL
Anesthesia: Choice

## 2015-02-20 ENCOUNTER — Ambulatory Visit (INDEPENDENT_AMBULATORY_CARE_PROVIDER_SITE_OTHER): Payer: Medicare Other | Admitting: Family Medicine

## 2015-02-20 ENCOUNTER — Encounter: Payer: Self-pay | Admitting: Family Medicine

## 2015-02-20 VITALS — BP 140/86 | HR 71 | Temp 97.9°F | Resp 16 | Ht 61.0 in | Wt 184.3 lb

## 2015-02-20 DIAGNOSIS — I10 Essential (primary) hypertension: Secondary | ICD-10-CM | POA: Diagnosis not present

## 2015-02-20 DIAGNOSIS — R739 Hyperglycemia, unspecified: Secondary | ICD-10-CM

## 2015-02-20 DIAGNOSIS — Z23 Encounter for immunization: Secondary | ICD-10-CM

## 2015-02-20 DIAGNOSIS — J4520 Mild intermittent asthma, uncomplicated: Secondary | ICD-10-CM | POA: Diagnosis not present

## 2015-02-20 DIAGNOSIS — R42 Dizziness and giddiness: Secondary | ICD-10-CM

## 2015-02-20 LAB — POCT GLYCOSYLATED HEMOGLOBIN (HGB A1C): Hemoglobin A1C: 6

## 2015-02-20 LAB — GLUCOSE, POCT (MANUAL RESULT ENTRY): POC Glucose: 102 mg/dL — AB (ref 70–99)

## 2015-02-21 DIAGNOSIS — Z23 Encounter for immunization: Secondary | ICD-10-CM | POA: Insufficient documentation

## 2015-02-21 DIAGNOSIS — J4520 Mild intermittent asthma, uncomplicated: Secondary | ICD-10-CM | POA: Insufficient documentation

## 2015-02-21 DIAGNOSIS — R739 Hyperglycemia, unspecified: Secondary | ICD-10-CM | POA: Insufficient documentation

## 2015-02-21 NOTE — Progress Notes (Signed)
Name: [name]   MRN: [id number]    DOB: 12-Jan-1941   Date:02/21/2015       Progress Note  Subjective  Chief Complaint  Chief Complaint  Patient presents with  . Hypertension  . Hyperlipidemia  . Insomnia    HPI  Hypertension   Patient presents for follow-up of hypertension. It has been present for over 5 years.  Patient states that there is compliance with medical regimen which consists of hydrochlorothiazide 25 mg daily and amlodipine 10 mg daily . There is no end organ disease. Cardiac risk factors include hypertension hyperlipidemia and diabetes.  Exercise regimen consist of occasional walking.  Diet consist of low-fat low-sodium diet in general but not with regularity .  Hyperglycemia  Patient has a history of elevated glucose in the past. Her glucoses been as high as 102 and hemoglobin A1c was 5.8 on 05/28/2014. She has not followed a low carb diet with regularity nor has she exercised with regularity and in fact has gained 4-1/2 pounds over the last 3-4 weeks. Currently she does not complain of any polyuria polydipsia polyphagia. She's never been diagnosed with diabetes.  Asthma  Patient has currently been off of her inhalers even her steroid inhaler. She is not currently having any respiratory difficulty. Specifically there is no cough or wheezing or sputum production dyspnea with exertion.   Obesity  Patient has a history of obesity for many years.  Attempts at weight loss have included diet and exercise and several plans .  Results of this regimen  have been less than consistent.  Patient now voices and interest in weight loss by  diet and exercise as well as nutritional consultation .  Vertigo  Patient has a long-standing history of intermittent vertigo which has been severe enough to have a workup at [facility] ([place] She has been in the ER and had to have IV fluids and be at bedrest for several days on occasion with medications for relief. Currently she has a  rare occasion but is careful about salt intake and takes a diuretic and a large amount of water on a regular basis usually.  Past Medical History  Diagnosis Date  . Hypertension   . Unspecified transient cerebral ischemia   . Acute bronchitis   . Pure hypercholesterolemia   . Osteoporosis, unspecified   . Unspecified asthma(493.90)   . Insomnia, unspecified   . Chronic kidney disease (CKD), stage III (moderate)     Social History  Substance Use Topics  . Smoking status: Never Smoker   . Smokeless tobacco: Never Used  . Alcohol Use: No     Current outpatient prescriptions:  .  albuterol (PROAIR HFA) 108 (90 BASE) MCG/ACT inhaler, Inhale 2 puffs into the lungs every 6 (six) hours as needed for wheezing., Disp: , Rfl:  .  amLODipine (NORVASC) 10 MG tablet, Take 10 mg by mouth daily., Disp: , Rfl:  .  aspirin 81 MG tablet, Take 81 mg by mouth daily., Disp: , Rfl:  .  budesonide-formoterol (SYMBICORT) 160-4.5 MCG/ACT inhaler, Inhale 2 puffs into the lungs 2 (two) times daily., Disp: , Rfl:  .  hydrochlorothiazide (HYDRODIURIL) 25 MG tablet, Take 25 mg by mouth daily., Disp: , Rfl:  .  meclizine (ANTIVERT) 25 MG tablet, Take 25 mg by mouth every 8 (eight) hours as needed., Disp: , Rfl:   Allergies  Allergen Reactions  . Ace Inhibitors   . Penicillins Anxiety    Review of Systems  Constitutional: Negative for fever,  chills and weight loss.  HENT: Negative for congestion, hearing loss, sore throat and tinnitus.   Eyes: Negative for blurred vision, double vision and redness.  Respiratory: Negative for cough, hemoptysis and shortness of breath.   Cardiovascular: Negative for chest pain, palpitations, orthopnea, claudication and leg swelling.  Gastrointestinal: Negative for heartburn, nausea, vomiting, diarrhea, constipation and blood in stool.  Genitourinary: Negative for dysuria, urgency, frequency and hematuria.  Musculoskeletal: Positive for joint pain. Negative for myalgias,  back pain, falls and neck pain.  Skin: Negative for itching.  Neurological: Positive for dizziness. Negative for tingling, tremors, focal weakness, seizures, loss of consciousness, weakness and headaches.  Endo/Heme/Allergies: Does not bruise/bleed easily.  Psychiatric/Behavioral: Negative for depression and substance abuse. The patient is not nervous/anxious and does not have insomnia.      Objective  Filed Vitals:   02/20/15 1220  BP: 140/86  Pulse: 71  Temp: 97.9 F (36.6 C)  Resp: 16  Height: 5\' 1"  (1.549 m)  Weight: 184 lb 5 oz (83.604 kg)  SpO2: 94%     Physical Exam    Assessment & Plan  1. Essential hypertension At goal - Comprehensive Metabolic Panel (CMET) - Cholesterol, Total - TSH  2. Asthma, mild intermittent, uncomplicated Well-controlled  3. Hyperglycemia Referral to nutritionist - POCT Glucose (CBG) - POCT HgB A1C - Ambulatory referral to diabetic education  4. Encounter for immunization Flu vaccine today  5. Need for influenza vaccination  - Flu vaccine HIGH DOSE PF (Fluzone High dose)  6. Dizziness Continue meclizine when necessary

## 2015-02-24 ENCOUNTER — Telehealth: Payer: Self-pay | Admitting: Family Medicine

## 2015-02-24 NOTE — Telephone Encounter (Signed)
Requesting a return call. Would like for someone to check with her insurance to see if it will cover her referral to the nutritionist.

## 2015-02-25 NOTE — Telephone Encounter (Signed)
Spoke with pt and encouraged her to call her ins company back to get more clarification on her coverage.

## 2015-02-28 DIAGNOSIS — I10 Essential (primary) hypertension: Secondary | ICD-10-CM | POA: Diagnosis not present

## 2015-03-01 LAB — COMPREHENSIVE METABOLIC PANEL WITH GFR
ALT: 20 [IU]/L (ref 0–32)
AST: 20 [IU]/L (ref 0–40)
Albumin/Globulin Ratio: 1.2 (ref 1.1–2.5)
Albumin: 4 g/dL (ref 3.5–4.8)
Alkaline Phosphatase: 71 [IU]/L (ref 39–117)
BUN/Creatinine Ratio: 19 (ref 11–26)
BUN: 16 mg/dL (ref 8–27)
Bilirubin Total: 0.4 mg/dL (ref 0.0–1.2)
CO2: 22 mmol/L (ref 18–29)
Calcium: 9.7 mg/dL (ref 8.7–10.3)
Chloride: 104 mmol/L (ref 97–108)
Creatinine, Ser: 0.86 mg/dL (ref 0.57–1.00)
GFR calc Af Amer: 78 mL/min/{1.73_m2}
GFR calc non Af Amer: 68 mL/min/{1.73_m2}
Globulin, Total: 3.3 g/dL (ref 1.5–4.5)
Glucose: 92 mg/dL (ref 65–99)
Potassium: 4.5 mmol/L (ref 3.5–5.2)
Sodium: 140 mmol/L (ref 134–144)
Total Protein: 7.3 g/dL (ref 6.0–8.5)

## 2015-03-01 LAB — TSH: TSH: 1.91 u[IU]/mL (ref 0.450–4.500)

## 2015-03-01 LAB — CHOLESTEROL, TOTAL: Cholesterol, Total: 172 mg/dL (ref 100–199)

## 2015-03-04 ENCOUNTER — Ambulatory Visit: Payer: Medicare Other | Admitting: Anesthesiology

## 2015-03-04 ENCOUNTER — Encounter: Payer: Self-pay | Admitting: *Deleted

## 2015-03-04 ENCOUNTER — Ambulatory Visit
Admission: RE | Admit: 2015-03-04 | Discharge: 2015-03-04 | Disposition: A | Discharge status: Home / Self Care | Payer: Medicare Other | Source: Ambulatory Visit | Attending: Gastroenterology | Admitting: Gastroenterology

## 2015-03-04 ENCOUNTER — Ambulatory Visit: Payer: Self-pay | Admitting: Family Medicine

## 2015-03-04 ENCOUNTER — Encounter
Admission: RE | Disposition: A | Discharge status: Home / Self Care | Payer: Self-pay | Source: Ambulatory Visit | Attending: Gastroenterology

## 2015-03-04 DIAGNOSIS — K5730 Diverticulosis of large intestine without perforation or abscess without bleeding: Secondary | ICD-10-CM | POA: Insufficient documentation

## 2015-03-04 DIAGNOSIS — K641 Second degree hemorrhoids: Secondary | ICD-10-CM | POA: Insufficient documentation

## 2015-03-04 DIAGNOSIS — K635 Polyp of colon: Secondary | ICD-10-CM | POA: Diagnosis not present

## 2015-03-04 DIAGNOSIS — Z880 Allergy status to penicillin: Secondary | ICD-10-CM | POA: Insufficient documentation

## 2015-03-04 DIAGNOSIS — Z888 Allergy status to other drugs, medicaments and biological substances status: Secondary | ICD-10-CM | POA: Diagnosis not present

## 2015-03-04 DIAGNOSIS — Z90710 Acquired absence of both cervix and uterus: Secondary | ICD-10-CM | POA: Diagnosis not present

## 2015-03-04 DIAGNOSIS — J45909 Unspecified asthma, uncomplicated: Secondary | ICD-10-CM | POA: Diagnosis not present

## 2015-03-04 DIAGNOSIS — Z860100 Personal history of colon polyps, unspecified: Secondary | ICD-10-CM | POA: Insufficient documentation

## 2015-03-04 DIAGNOSIS — Z8673 Personal history of transient ischemic attack (TIA), and cerebral infarction without residual deficits: Secondary | ICD-10-CM | POA: Diagnosis not present

## 2015-03-04 DIAGNOSIS — M810 Age-related osteoporosis without current pathological fracture: Secondary | ICD-10-CM | POA: Diagnosis not present

## 2015-03-04 DIAGNOSIS — E780 Pure hypercholesterolemia: Secondary | ICD-10-CM | POA: Diagnosis not present

## 2015-03-04 DIAGNOSIS — N183 Chronic kidney disease, stage 3 (moderate): Secondary | ICD-10-CM | POA: Diagnosis not present

## 2015-03-04 DIAGNOSIS — Z8249 Family history of ischemic heart disease and other diseases of the circulatory system: Secondary | ICD-10-CM | POA: Insufficient documentation

## 2015-03-04 DIAGNOSIS — I129 Hypertensive chronic kidney disease with stage 1 through stage 4 chronic kidney disease, or unspecified chronic kidney disease: Secondary | ICD-10-CM | POA: Diagnosis not present

## 2015-03-04 DIAGNOSIS — D125 Benign neoplasm of sigmoid colon: Secondary | ICD-10-CM | POA: Insufficient documentation

## 2015-03-04 DIAGNOSIS — Z825 Family history of asthma and other chronic lower respiratory diseases: Secondary | ICD-10-CM | POA: Diagnosis not present

## 2015-03-04 DIAGNOSIS — Z79899 Other long term (current) drug therapy: Secondary | ICD-10-CM | POA: Insufficient documentation

## 2015-03-04 DIAGNOSIS — K5790 Diverticulosis of intestine, part unspecified, without perforation or abscess without bleeding: Secondary | ICD-10-CM | POA: Diagnosis not present

## 2015-03-04 DIAGNOSIS — Z7982 Long term (current) use of aspirin: Secondary | ICD-10-CM | POA: Insufficient documentation

## 2015-03-04 HISTORY — PX: COLONOSCOPY WITH PROPOFOL: SHX5780

## 2015-03-04 SURGERY — COLONOSCOPY WITH PROPOFOL
Anesthesia: General

## 2015-03-04 MED ORDER — SODIUM CHLORIDE 0.9 % IV SOLN
INTRAVENOUS | Status: DC
  Administered 2015-03-04: 10:00:00 via INTRAVENOUS

## 2015-03-04 MED ORDER — PROPOFOL INFUSION 10 MG/ML OPTIME
INTRAVENOUS | Status: DC | PRN
  Administered 2015-03-04: 120 ug/kg/min via INTRAVENOUS

## 2015-03-04 MED ORDER — MIDAZOLAM HCL 2 MG/2ML IJ SOLN
INTRAMUSCULAR | Status: DC | PRN
  Administered 2015-03-04: 1 mg via INTRAVENOUS

## 2015-03-04 MED ORDER — GLYCOPYRROLATE 0.2 MG/ML IJ SOLN
INTRAMUSCULAR | Status: DC | PRN
  Administered 2015-03-04: 0.1 mg via INTRAVENOUS

## 2015-03-04 MED ORDER — FENTANYL CITRATE (PF) 100 MCG/2ML IJ SOLN
INTRAMUSCULAR | Status: DC | PRN
  Administered 2015-03-04: 50 ug via INTRAVENOUS

## 2015-03-04 NOTE — Transfer of Care (Signed)
Immediate Anesthesia Transfer of Care Note  Patient: [name]  Procedure(s) Performed: Procedure(s): COLONOSCOPY WITH PROPOFOL (N/A)  Patient Location: PACU  Anesthesia Type:General  Level of Consciousness: awake, alert  and sedated  Airway & Oxygen Therapy: Patient Spontanous Breathing and Patient connected to nasal cannula oxygen  Post-op Assessment: Report given to RN and Post -op Vital signs reviewed and stable  Post vital signs: Reviewed and stable  Last Vitals:  Filed Vitals:   03/04/15 0922  BP: 155/81  Pulse: 50  Temp: 36.6 C  Resp: 12    Complications: No apparent anesthesia complications

## 2015-03-04 NOTE — Anesthesia Preprocedure Evaluation (Addendum)
Anesthesia Evaluation  Patient identified by MRN, date of birth, ID band Patient awake    Reviewed: Allergy & Precautions, H&P , NPO status , Patient's Chart, lab work & pertinent test results  Airway Mallampati: III  TM Distance: >3 FB Neck ROM: limited    Dental  (+) Poor Dentition, Implants, Chipped, Caps   Pulmonary asthma ,  breath sounds clear to auscultation  Pulmonary exam normal       Cardiovascular hypertension, Normal cardiovascular examRhythm:regular Rate:Normal     Neuro/Psych negative neurological ROS  negative psych ROS   GI/Hepatic negative GI ROS, Neg liver ROS,   Endo/Other  negative endocrine ROS  Renal/GU CRFRenal disease  negative genitourinary   Musculoskeletal   Abdominal   Peds  Hematology negative hematology ROS (+)   Anesthesia Other Findings Past Medical History:   Hypertension                                                 Unspecified transient cerebral ischemia                      Acute bronchitis                                             Pure hypercholesterolemia                                    Osteoporosis, unspecified                                    Unspecified asthma(493.90)                                   Insomnia, unspecified                                        Chronic kidney disease (CKD), stage III (moder*              Reproductive/Obstetrics negative OB ROS                            Anesthesia Physical Anesthesia Plan  ASA: III  Anesthesia Plan: General   Post-op Pain Management:    Induction:   Airway Management Planned:   Additional Equipment:   Intra-op Plan:   Post-operative Plan:   Informed Consent: I have reviewed the patients History and Physical, chart, labs and discussed the procedure including the risks, benefits and alternatives for the proposed anesthesia with the patient or authorized representative who has  indicated his/her understanding and acceptance.   Dental Advisory Given  Plan Discussed with: Anesthesiologist, CRNA and Surgeon  Anesthesia Plan Comments:         Anesthesia Quick Evaluation

## 2015-03-04 NOTE — Op Note (Signed)
[facility] Gastroenterology Patient Name: [name] Procedure Date: 03/04/2015 10:32 AM MRN: [id number] Account #: [id number] Date of Birth: 08-29-1940 Admit Type: Outpatient Age: 74 Room: [facility] ENDO ROOM 4 Gender: Female Note Status: Finalized Procedure:         Colonoscopy Indications:       High risk colon cancer surveillance: Personal history of                     colonic polyps Providers:         Lucilla Lame, MD Referring MD:      Ashok Norris, MD (Referring MD) Medicines:         Propofol per Anesthesia Complications:     No immediate complications. Procedure:         Pre-Anesthesia Assessment:                    - Prior to the procedure, a History and Physical was                     performed, and patient medications and allergies were                     reviewed. The patient's tolerance of previous anesthesia                     was also reviewed. The risks and benefits of the procedure                     and the sedation options and risks were discussed with the                     patient. All questions were answered, and informed consent                     was obtained. Prior Anticoagulants: The patient has taken                     no previous anticoagulant or antiplatelet agents. ASA                     Grade Assessment: II - A patient with mild systemic                     disease. After reviewing the risks and benefits, the                     patient was deemed in satisfactory condition to undergo                     the procedure.                    After obtaining informed consent, the colonoscope was                     passed under direct vision. Throughout the procedure, the                     patient's blood pressure, pulse, and oxygen saturations                     were monitored continuously. The Colonoscope was  introduced through the anus and advanced to the the cecum,                     identified by  appendiceal orifice and ileocecal valve. The                     colonoscopy was performed without difficulty. The patient                     tolerated the procedure well. The quality of the bowel                     preparation was excellent. Findings:      The perianal and digital rectal examinations were normal.      A 4 mm polyp was found in the sigmoid colon. The polyp was sessile. The       polyp was removed with a cold biopsy forceps. Resection and retrieval       were complete.      Multiple small-mouthed diverticula were found in the sigmoid colon.      Non-bleeding internal hemorrhoids were found during retroflexion. The       hemorrhoids were Grade II (internal hemorrhoids that prolapse but reduce       spontaneously). Impression:        - One 4 mm polyp in the sigmoid colon. Resected and                     retrieved.                    - Diverticulosis in the sigmoid colon.                    - Non-bleeding internal hemorrhoids. Recommendation:    - Await pathology results.                    - Repeat colonoscopy in 5 years for surveillance. Procedure Code(s): --- Professional ---                    [phone], Colonoscopy, flexible; with biopsy, single or                     multiple Diagnosis Code(s): --- Professional ---                    Z86.010, Personal history of colonic polyps                    D12.5, Benign neoplasm of sigmoid colon CPT copyright 2014 [employer]. All rights reserved. The codes documented in this report are preliminary and upon coder review may  be revised to meet current compliance requirements. Lucilla Lame, MD 03/04/2015 11:00:42 AM This report has been signed electronically. Number of Addenda: 0 Note Initiated On: 03/04/2015 10:32 AM Scope Withdrawal Time: 0 hours 7 minutes 11 seconds  Total Procedure Duration: 0 hours 11 minutes 42 seconds       [facility]

## 2015-03-04 NOTE — Anesthesia Postprocedure Evaluation (Signed)
  Anesthesia Post-op Note  Patient: [name]  Procedure(s) Performed: Procedure(s): COLONOSCOPY WITH PROPOFOL (N/A)  Anesthesia type:General  Patient location: PACU  Post pain: Pain level controlled  Post assessment: Post-op Vital signs reviewed, Patient's Cardiovascular Status Stable, Respiratory Function Stable, Patent Airway and No signs of Nausea or vomiting  Post vital signs: Reviewed and stable  Last Vitals:  Filed Vitals:   03/04/15 1140  BP: 137/77  Pulse: 63  Temp:   Resp: 15    Level of consciousness: awake, alert  and patient cooperative  Complications: No apparent anesthesia complications

## 2015-03-04 NOTE — H&P (Signed)
  [facility]  [address], [place], [place] [address] Phone: [phone] Fax : [phone]  Primary Care Physician:  Ashok Norris, MD Primary Gastroenterologist:  Dr. Allen Norris  Pre-Procedure History & Physical: HPI:  [name] is a 74 y.o. female is here for an colonoscopy.   Past Medical History  Diagnosis Date  . Hypertension   . Unspecified transient cerebral ischemia   . Acute bronchitis   . Pure hypercholesterolemia   . Osteoporosis, unspecified   . Unspecified asthma(493.90)   . Insomnia, unspecified   . Chronic kidney disease (CKD), stage III (moderate)     Past Surgical History  Procedure Laterality Date  . Abdominal hysterectomy      Prior to Admission medications   Medication Sig Start Date End Date Taking? Authorizing Provider  albuterol (PROAIR HFA) 108 (90 BASE) MCG/ACT inhaler Inhale 2 puffs into the lungs every 6 (six) hours as needed for wheezing.   Yes Historical Provider, MD  amLODipine (NORVASC) 10 MG tablet Take 10 mg by mouth daily.   Yes Historical Provider, MD  aspirin 81 MG tablet Take 81 mg by mouth daily.   Yes Historical Provider, MD  budesonide-formoterol (SYMBICORT) 160-4.5 MCG/ACT inhaler Inhale 2 puffs into the lungs 2 (two) times daily.   Yes Historical Provider, MD  hydrochlorothiazide (HYDRODIURIL) 25 MG tablet Take 25 mg by mouth daily.   Yes Historical Provider, MD  meclizine (ANTIVERT) 25 MG tablet Take 25 mg by mouth every 8 (eight) hours as needed.    Historical Provider, MD    Allergies as of 01/23/2015 - Review Complete 01/02/2015  Allergen Reaction Noted  . Ace inhibitors  02/15/2013  . Penicillins Anxiety 02/15/2013    Family History  Problem Relation Age of Onset  . COPD Father   . Hypertension Mother   . Hypertension Brother     Social History   Social History  . Marital Status: Single    Spouse Name: N/A  . Number of Children: N/A  . Years of Education: N/A   Occupational History    . Not on file.   Social History Main Topics  . Smoking status: Never Smoker   . Smokeless tobacco: Never Used  . Alcohol Use: No  . Drug Use: No  . Sexual Activity: Not Currently   Other Topics Concern  . Not on file   Social History Narrative    Review of Systems: See HPI, otherwise negative ROS  Physical Exam: BP 155/81 mmHg  Pulse 50  Temp(Src) 97.9 F (36.6 C) (Tympanic)  Resp 12  SpO2 100% General:   Alert,  pleasant and cooperative in NAD Head:  Normocephalic and atraumatic. Neck:  Supple; no masses or thyromegaly. Lungs:  Clear throughout to auscultation.    Heart:  Regular rate and rhythm. Abdomen:  Soft, nontender and nondistended. Normal bowel sounds, without guarding, and without rebound.   Neurologic:  Alert and  oriented x4;  grossly normal neurologically.  Impression/Plan: [name] is here for an colonoscopy to be performed for history of colon polyps  Risks, benefits, limitations, and alternatives regarding  colonoscopy have been reviewed with the patient.  Questions have been answered.  All parties agreeable.   Ollen Bowl, MD  03/04/2015, 10:31 AM

## 2015-03-05 LAB — SURGICAL PATHOLOGY

## 2015-03-06 ENCOUNTER — Encounter: Payer: Self-pay | Admitting: Gastroenterology

## 2015-06-24 ENCOUNTER — Encounter: Payer: Self-pay | Admitting: Family Medicine

## 2015-06-24 ENCOUNTER — Ambulatory Visit (INDEPENDENT_AMBULATORY_CARE_PROVIDER_SITE_OTHER): Payer: Medicare Other | Admitting: Family Medicine

## 2015-06-24 VITALS — BP 128/80 | HR 67 | Temp 98.2°F | Resp 18 | Ht 61.0 in | Wt 183.4 lb

## 2015-06-24 DIAGNOSIS — I10 Essential (primary) hypertension: Secondary | ICD-10-CM

## 2015-06-24 DIAGNOSIS — R001 Bradycardia, unspecified: Secondary | ICD-10-CM

## 2015-06-24 DIAGNOSIS — R739 Hyperglycemia, unspecified: Secondary | ICD-10-CM

## 2015-06-24 DIAGNOSIS — J4520 Mild intermittent asthma, uncomplicated: Secondary | ICD-10-CM | POA: Diagnosis not present

## 2015-06-24 LAB — POCT GLYCOSYLATED HEMOGLOBIN (HGB A1C): Hemoglobin A1C: 5.9

## 2015-06-24 LAB — GLUCOSE, POCT (MANUAL RESULT ENTRY): POC Glucose: 99 mg/dL (ref 70–99)

## 2015-06-24 NOTE — Progress Notes (Signed)
Name: [name]   MRN: [id number]    DOB: 01/05/1941   Date:06/24/2015       Progress Note  Subjective  Chief Complaint  Chief Complaint  Patient presents with  . Hypertension    pt here for 4 month follow up  . Hyperglycemia    HPI  Hypertension   Patient presents for follow-up of hypertension. It has been present for over 5 years.  Patient states that there is compliance with medical regimen which consists of amlodipine 10 mg daily and Hydrea diarrheal 25 mg daily . There is no end organ disease. Cardiac risk factors include hypertension hyperlipidemia and diabetes.  Exercise regimen consist of some walking .  Diet consist of salt restriction and decreasing her intake of carbs  Hyperglycemia  Over the past 6 months the patient's had a history of elevated hemoglobin A1c of 6.0. She denies any polyuria polydipsia polyphagia. There is a family history of diabetes.   .Hyperlipidemia  Patient has a history of hyperlipidemia for 5 years.  Current medical regimen consist of diet and exercise .  Compliance is fairly given .  Diet and exercise are currently followed pretty regularly .  Risk factors for cardiovascular disease include hyperlipidemia hypertension .   There have been no side effects from the medication.    Asthma  Patient has a history of asthma over 5 years. Is mild and intermittent. She had a significant flare during RECONSTRUCTION in her home few years ago. Currently she is not using her Symbicort or rescue inhaler with any regularity.   Past Medical History  Diagnosis Date  . Hypertension   . Unspecified transient cerebral ischemia   . Acute bronchitis   . Pure hypercholesterolemia   . Osteoporosis, unspecified   . Unspecified asthma(493.90)   . Insomnia, unspecified   . Chronic kidney disease (CKD), stage III (moderate)     Social History  Substance Use Topics  . Smoking status: Never Smoker   . Smokeless tobacco: Never Used  . Alcohol Use: No      Current outpatient prescriptions:  .  amLODipine (NORVASC) 10 MG tablet, Take 10 mg by mouth daily., Disp: , Rfl:  .  aspirin 81 MG tablet, Take 81 mg by mouth daily., Disp: , Rfl:  .  hydrochlorothiazide (HYDRODIURIL) 25 MG tablet, Take 25 mg by mouth daily., Disp: , Rfl:  .  meclizine (ANTIVERT) 25 MG tablet, Take 25 mg by mouth every 8 (eight) hours as needed., Disp: , Rfl:   Allergies  Allergen Reactions  . Ace Inhibitors   . Penicillins Anxiety    Review of Systems  Constitutional: Negative for fever, chills and weight loss.  HENT: Negative for congestion, hearing loss, sore throat and tinnitus.   Eyes: Negative for blurred vision, double vision and redness.  Respiratory: Positive for cough and wheezing. Negative for hemoptysis and shortness of breath.   Cardiovascular: Negative for chest pain, palpitations, orthopnea, claudication and leg swelling.  Gastrointestinal: Negative for heartburn, nausea, vomiting, diarrhea, constipation and blood in stool.  Genitourinary: Negative for dysuria, urgency, frequency and hematuria.  Musculoskeletal: Positive for joint pain. Negative for myalgias, back pain, falls and neck pain.  Skin: Negative for itching.  Neurological: Negative for dizziness, tingling, tremors, focal weakness, seizures, loss of consciousness, weakness and headaches.  Endo/Heme/Allergies: Does not bruise/bleed easily.  Psychiatric/Behavioral: Negative for depression and substance abuse. The patient is not nervous/anxious and does not have insomnia.      Objective  Filed Vitals:  06/24/15 0848  BP: 128/80  Pulse: 67  Temp: 98.2 F (36.8 C)  Resp: 18  Height: 5\' 1"  (1.549 m)  Weight: 183 lb 7 oz (83.207 kg)  SpO2: 95%     Physical Exam  Constitutional: She is oriented to person, place, and time and well-developed, well-nourished, and in no distress.  HENT:  Head: Normocephalic.  Eyes: EOM are normal. Pupils are equal, round, and reactive to light.   Neck: Normal range of motion. No thyromegaly present.  Cardiovascular: Normal rate, regular rhythm and normal heart sounds.   No murmur heard. Pulmonary/Chest: Effort normal and breath sounds normal.  Abdominal: Soft. Bowel sounds are normal.  Musculoskeletal: Normal range of motion. She exhibits no edema.  Neurological: She is alert and oriented to person, place, and time. No cranial nerve deficit. Gait normal.  Skin: Skin is warm and dry. No rash noted.  Psychiatric: Memory and affect normal.      Assessment & Plan   1. Essential hypertension Well-controlled  2. Sinus bradycardia Resolved  3. Asthma, mild intermittent, uncomplicated Unable to get 3 consistent trials but FEV1 was to [facility] was 97% which is good- Spirometry: Pre & Post Eval  4. Hyperglycemia Repeat hemoglobin A1c and encouraged continuation of diet and exercise - POCT HgB A1C - POCT Glucose (CBG)

## 2015-07-10 ENCOUNTER — Telehealth: Payer: Self-pay

## 2015-07-10 NOTE — Telephone Encounter (Signed)
Returned pt's call regarding a bill she received in regards to her colonoscopy from Sept. Left message for pt to return my call to discuss reason for coding.

## 2015-07-11 NOTE — Telephone Encounter (Signed)
Obtained colonoscopy report from [place]. Will give to Dr. Allen Norris for review and decide if he needs to amend the previous charge.

## 2015-08-11 ENCOUNTER — Telehealth: Payer: Self-pay | Admitting: Gastroenterology

## 2015-08-11 NOTE — Telephone Encounter (Signed)
She has a question about her colonoscopy. They put it as a surgery

## 2015-10-28 ENCOUNTER — Ambulatory Visit: Payer: Medicare Other | Admitting: Family Medicine

## 2015-12-02 ENCOUNTER — Other Ambulatory Visit: Payer: Self-pay | Admitting: Family Medicine

## 2015-12-02 DIAGNOSIS — Z1231 Encounter for screening mammogram for malignant neoplasm of breast: Secondary | ICD-10-CM

## 2015-12-25 ENCOUNTER — Ambulatory Visit
Admission: RE | Admit: 2015-12-25 | Discharge: 2015-12-25 | Disposition: A | Discharge status: Home / Self Care | Payer: Medicare HMO | Source: Ambulatory Visit | Attending: Family Medicine | Admitting: Family Medicine

## 2015-12-25 ENCOUNTER — Other Ambulatory Visit: Payer: Self-pay | Admitting: Family Medicine

## 2015-12-25 DIAGNOSIS — Z1231 Encounter for screening mammogram for malignant neoplasm of breast: Secondary | ICD-10-CM | POA: Diagnosis not present

## 2016-01-06 ENCOUNTER — Other Ambulatory Visit: Payer: Self-pay

## 2016-01-06 MED ORDER — HYDROCHLOROTHIAZIDE 25 MG PO TABS: 25.0000 mg | ORAL_TABLET | Freq: Every day | ORAL | Status: DC

## 2016-01-06 MED ORDER — AMLODIPINE BESYLATE 10 MG PO TABS: 10.0000 mg | ORAL_TABLET | Freq: Every day | ORAL | Status: DC

## 2016-01-06 NOTE — Telephone Encounter (Signed)
Pt has an appt for 01/26/2016. First available. Pt needs refill

## 2016-01-06 NOTE — Telephone Encounter (Signed)
Pt needs appt and then can send to Dr. lada for a refill

## 2016-01-06 NOTE — Telephone Encounter (Signed)
Labs Sept 2016 reviewed (creatinine, K+); Rx approved

## 2016-01-26 ENCOUNTER — Ambulatory Visit (INDEPENDENT_AMBULATORY_CARE_PROVIDER_SITE_OTHER): Payer: Medicare HMO | Admitting: Family Medicine

## 2016-01-26 ENCOUNTER — Encounter: Payer: Self-pay | Admitting: Family Medicine

## 2016-01-26 ENCOUNTER — Telehealth: Payer: Self-pay | Admitting: Family Medicine

## 2016-01-26 VITALS — BP 136/82 | HR 59 | Temp 97.9°F | Resp 14 | Wt 184.0 lb

## 2016-01-26 DIAGNOSIS — I10 Essential (primary) hypertension: Secondary | ICD-10-CM | POA: Diagnosis not present

## 2016-01-26 DIAGNOSIS — Z5181 Encounter for therapeutic drug level monitoring: Secondary | ICD-10-CM | POA: Insufficient documentation

## 2016-01-26 DIAGNOSIS — J4520 Mild intermittent asthma, uncomplicated: Secondary | ICD-10-CM

## 2016-01-26 DIAGNOSIS — R739 Hyperglycemia, unspecified: Secondary | ICD-10-CM | POA: Diagnosis not present

## 2016-01-26 DIAGNOSIS — R001 Bradycardia, unspecified: Secondary | ICD-10-CM

## 2016-01-26 LAB — COMPLETE METABOLIC PANEL WITHOUT GFR
ALT: 21 U/L (ref 6–29)
AST: 17 U/L (ref 10–35)
Albumin: 3.9 g/dL (ref 3.6–5.1)
Alkaline Phosphatase: 59 U/L (ref 33–130)
BUN: 17 mg/dL (ref 7–25)
CO2: 26 mmol/L (ref 20–31)
Calcium: 9.1 mg/dL (ref 8.6–10.4)
Chloride: 106 mmol/L (ref 98–110)
Creat: 0.92 mg/dL (ref 0.60–0.93)
GFR, Est African American: 72 mL/min
GFR, Est Non African American: 62 mL/min
Glucose, Bld: 101 mg/dL — ABNORMAL HIGH (ref 65–99)
Potassium: 4.9 mmol/L (ref 3.5–5.3)
Sodium: 139 mmol/L (ref 135–146)
Total Bilirubin: 0.4 mg/dL (ref 0.2–1.2)
Total Protein: 6.9 g/dL (ref 6.1–8.1)

## 2016-01-26 LAB — LIPID PANEL
Cholesterol: 159 mg/dL (ref 125–200)
HDL: 34 mg/dL — ABNORMAL LOW
LDL Cholesterol: 103 mg/dL
Total CHOL/HDL Ratio: 4.7 ratio
Triglycerides: 108 mg/dL
VLDL: 22 mg/dL

## 2016-01-26 LAB — TSH: TSH: 1.72 m[IU]/L

## 2016-01-26 LAB — HEMOGLOBIN A1C
Hgb A1c MFr Bld: 6.3 % — ABNORMAL HIGH
Mean Plasma Glucose: 134 mg/dL

## 2016-01-26 MED ORDER — MECLIZINE HCL 25 MG PO TABS: 25.0000 mg | ORAL_TABLET | Freq: Three times a day (TID) | ORAL | 0 refills | Status: DC | PRN

## 2016-01-26 MED ORDER — ALBUTEROL SULFATE HFA 108 (90 BASE) MCG/ACT IN AERS: 2.0000 | INHALATION_SPRAY | RESPIRATORY_TRACT | 0 refills | Status: DC | PRN

## 2016-01-26 NOTE — Assessment & Plan Note (Signed)
Check glucose and A1c; avoid whites 

## 2016-01-26 NOTE — Assessment & Plan Note (Signed)
Try DASH guidelines; continue medicines; check Cr and electrolytes on meds

## 2016-01-26 NOTE — Telephone Encounter (Signed)
rx sent

## 2016-01-26 NOTE — Patient Instructions (Addendum)
Your goal blood pressure is less than 140 mmHg on top. Try to follow the DASH guidelines (DASH stands for Dietary Approaches to Stop Hypertension) Try to limit the sodium in your diet.  Ideally, consume less than 1.5 grams (less than 1,500mg ) per day. Do not add salt when cooking or at the table.  Check the sodium amount on labels when shopping, and choose items lower in sodium when given a choice. Avoid or limit foods that already contain a lot of sodium. Eat a diet rich in fruits and vegetables and whole grains.  Try to limit saturated fats in your diet (bologna, hot dogs, barbeque, cheeseburgers, hamburgers, steak, bacon, sausage, cheese, etc.) and get more fresh fruits, vegetables, and whole grains  We'll get labs today If you have not heard anything from my staff in a week about any orders/referrals/studies from today, please contact us here to follow-up [phone]  DASH Eating Plan DASH stands for "Dietary Approaches to Stop Hypertension." The DASH eating plan is a healthy eating plan that has been shown to reduce high blood pressure (hypertension). Additional health benefits may include reducing the risk of type 2 diabetes mellitus, heart disease, and stroke. The DASH eating plan may also help with weight loss. WHAT DO I NEED TO KNOW ABOUT THE DASH EATING PLAN? For the DASH eating plan, you will follow these general guidelines:  Choose foods with a percent daily value for sodium of less than 5% (as listed on the food label).  Use salt-free seasonings or herbs instead of table salt or sea salt.  Check with your health care provider or pharmacist before using salt substitutes.  Eat lower-sodium products, often labeled as "lower sodium" or "no salt added."  Eat fresh foods.  Eat more vegetables, fruits, and low-fat dairy products.  Choose whole grains. Look for the word "whole" as the first word in the ingredient list.  Choose fish and skinless chicken or [place] more often than  red meat. Limit fish, poultry, and meat to 6 oz (170 g) each day.  Limit sweets, desserts, sugars, and sugary drinks.  Choose heart-healthy fats.  Limit cheese to 1 oz (28 g) per day.  Eat more home-cooked food and less restaurant, buffet, and fast food.  Limit fried foods.  Cook foods using methods other than frying.  Limit canned vegetables. If you do use them, rinse them well to decrease the sodium.  When eating at a restaurant, ask that your food be prepared with less salt, or no salt if possible. WHAT FOODS CAN I EAT? Seek help from a dietitian for individual calorie needs. Grains Whole grain or whole wheat bread. Brown rice. Whole grain or whole wheat pasta. Quinoa, bulgur, and whole grain cereals. Low-sodium cereals. Corn or whole wheat flour tortillas. Whole grain cornbread. Whole grain crackers. Low-sodium crackers. Vegetables Fresh or frozen vegetables (raw, steamed, roasted, or grilled). Low-sodium or reduced-sodium tomato and vegetable juices. Low-sodium or reduced-sodium tomato sauce and paste. Low-sodium or reduced-sodium canned vegetables.  Fruits All fresh, canned (in natural juice), or frozen fruits. Meat and Other Protein Products Ground beef (85% or leaner), grass-fed beef, or beef trimmed of fat. Skinless chicken or [place]. Ground chicken or [place]. Pork trimmed of fat. All fish and seafood. Eggs. Dried beans, peas, or lentils. Unsalted nuts and seeds. Unsalted canned beans. Dairy Low-fat dairy products, such as skim or 1% milk, 2% or reduced-fat cheeses, low-fat ricotta or cottage cheese, or plain low-fat yogurt. Low-sodium or reduced-sodium cheeses. Fats and Oils Tub  margarines without trans fats. Light or reduced-fat mayonnaise and salad dressings (reduced sodium). Avocado. Safflower, olive, or canola oils. Natural peanut or almond butter. Other Unsalted popcorn and pretzels. The items listed above may not be a complete list of recommended foods or beverages.  Contact your dietitian for more options. WHAT FOODS ARE NOT RECOMMENDED? Grains White bread. White pasta. White rice. Refined cornbread. Bagels and croissants. Crackers that contain trans fat. Vegetables Creamed or fried vegetables. Vegetables in a cheese sauce. Regular canned vegetables. Regular canned tomato sauce and paste. Regular tomato and vegetable juices. Fruits Dried fruits. Canned fruit in light or heavy syrup. Fruit juice. Meat and Other Protein Products Fatty cuts of meat. Ribs, chicken wings, bacon, sausage, bologna, salami, chitterlings, fatback, hot dogs, bratwurst, and packaged luncheon meats. Salted nuts and seeds. Canned beans with salt. Dairy Whole or 2% milk, cream, half-and-half, and cream cheese. Whole-fat or sweetened yogurt. Full-fat cheeses or blue cheese. Nondairy creamers and whipped toppings. Processed cheese, cheese spreads, or cheese curds. Condiments Onion and garlic salt, seasoned salt, table salt, and sea salt. Canned and packaged gravies. Worcestershire sauce. Tartar sauce. Barbecue sauce. Teriyaki sauce. Soy sauce, including reduced sodium. Steak sauce. Fish sauce. Oyster sauce. Cocktail sauce. Horseradish. Ketchup and mustard. Meat flavorings and tenderizers. Bouillon cubes. Hot sauce. Tabasco sauce. Marinades. Taco seasonings. Relishes. Fats and Oils Butter, stick margarine, lard, shortening, ghee, and bacon fat. Coconut, palm kernel, or palm oils. Regular salad dressings. Other Pickles and olives. Salted popcorn and pretzels. The items listed above may not be a complete list of foods and beverages to avoid. Contact your dietitian for more information. WHERE CAN I FIND MORE INFORMATION? National Heart, Lung, and Blood Institute: travelstabloid.com   This information is not intended to replace advice given to you by your health care provider. Make sure you discuss any questions you have with your health care provider.    Document Released: 06/03/2011 Document Revised: 07/05/2014 Document Reviewed: 04/18/2013 Elsevier Interactive Patient Education [employer].

## 2016-01-26 NOTE — Assessment & Plan Note (Signed)
Check TSH 

## 2016-01-26 NOTE — Assessment & Plan Note (Signed)
Not bothering patient at all; rescue inhaler to have just if needed

## 2016-01-26 NOTE — Progress Notes (Signed)
BP 136/82   Pulse (!) 59   Temp 97.9 F (36.6 C) (Oral)   Resp 14   Wt 184 lb (83.5 kg)   SpO2 96%   BMI 34.77 kg/m    Subjective:    [name], female    DOB: [birth year], 75 y.o.   MRN: [id number]  HPI: [name] is a 75 y.o. female  Chief Complaint  Patient presents with  . Medication Refill   Hypertension; she takes two medicines for BP; had angioedema with ACE, no more -prils she was told; she has cut back on salt; explained no increased risk for other health problems as long as we (the medical community) don't give her ACE-inhibitors; no decongestants  She has "prediabetes"; last A1c reviewed Lab Results  Component Value Date   HGBA1C 5.9 06/24/2015   She has a dx of asthma, but says she isn't bothered by it and has not used inhaler for a long time  Depression screen [facility] 2/9 01/26/2016 06/24/2015 02/20/2015  Decreased Interest 0 0 0  Down, Depressed, Hopeless 0 0 0  PHQ - 2 Score 0 0 0    No flowsheet data found.  Relevant past medical, surgical, family and social history reviewed Past Medical History:  Diagnosis Date  . Acute bronchitis   . Chronic kidney disease (CKD), stage III (moderate)   . Hypertension   . Insomnia, unspecified   . Osteoporosis, unspecified   . Pure hypercholesterolemia   . Unspecified asthma(493.90)   . Unspecified transient cerebral ischemia    Past Surgical History:  Procedure Laterality Date  . ABDOMINAL HYSTERECTOMY    . COLONOSCOPY WITH PROPOFOL N/A 03/04/2015   Procedure: COLONOSCOPY WITH PROPOFOL;  Surgeon: Lucilla Lame, MD;  Location: [facility] ENDOSCOPY;  Service: Endoscopy;  Laterality: N/A;   Family History  Problem Relation Age of Onset  . COPD Father   . Hypertension Mother   . Hypertension Brother   . Cancer Brother   MD notes: brother had cancer on the outside of the pancreas, deceased  Social History  Substance Use Topics  . Smoking status: Never Smoker  . Smokeless tobacco: Never Used    . Alcohol use No   Interim medical history since last visit reviewed. Allergies and medications reviewed  Review of Systems  HENT: Negative for mouth sores.   Gastrointestinal: Negative for blood in stool.  Genitourinary: Negative for hematuria.   Per HPI unless specifically indicated above     Objective:    BP 136/82   Pulse (!) 59   Temp 97.9 F (36.6 C) (Oral)   Resp 14   Wt 184 lb (83.5 kg)   SpO2 96%   BMI 34.77 kg/m   Wt Readings from Last 3 Encounters:  01/26/16 184 lb (83.5 kg)  06/24/15 183 lb 7 oz (83.2 kg)  02/20/15 184 lb 5 oz (83.6 kg)    Physical Exam  Constitutional: She appears well-developed and well-nourished. No distress.  HENT:  Head: Normocephalic and atraumatic.  Eyes: EOM are normal. No scleral icterus.  Neck: No thyromegaly present.  Cardiovascular: Regular rhythm.  Bradycardia present.   No murmur heard. Pulmonary/Chest: Effort normal and breath sounds normal. No respiratory distress. She has no wheezes.  Abdominal: Soft. Bowel sounds are normal. She exhibits no distension.  Musculoskeletal: Normal range of motion. She exhibits no edema.  Neurological: She is alert. She exhibits normal muscle tone.  Skin: Skin is warm and dry. She is not diaphoretic. No pallor.  Psychiatric: She has a normal mood and affect. Her behavior is normal. Judgment and thought content normal.   Results for orders placed or performed in visit on 06/24/15  POCT HgB A1C  Result Value Ref Range   Hemoglobin A1C 5.9   POCT Glucose (CBG)  Result Value Ref Range   POC Glucose 99 70 - 99 mg/dl      Assessment & Plan:   Problem List Items Addressed This Visit      Cardiovascular and Mediastinum   Sinus bradycardia    Check TSH      Relevant Orders   TSH   Essential hypertension - Primary    Try DASH guidelines; continue medicines; check Cr and electrolytes on meds        Respiratory   Asthma, mild intermittent    Not bothering patient at all; rescue  inhaler to have just if needed        Other   Medication monitoring encounter   Relevant Orders   COMPLETE METABOLIC PANEL WITH GFR   Hyperglycemia    Check glucose and A1c; avoid "whites"      Relevant Orders   Hemoglobin A1c   Lipid panel    Other Visit Diagnoses   None.     Follow up plan: Return in about 6 months (around 07/28/2016) for fasting labs and visit with Dr. Rutherford Nail if back, me if not.  An after-visit summary was printed and given to the patient at [place].  Please see the patient instructions which may contain other information and recommendations beyond what is mentioned above in the assessment and plan.  No orders of the defined types were placed in this encounter.   Orders Placed This Encounter  Procedures  . COMPLETE METABOLIC PANEL WITH GFR  . Hemoglobin A1c  . Lipid panel  . TSH

## 2016-01-27 DIAGNOSIS — I480 Paroxysmal atrial fibrillation: Secondary | ICD-10-CM

## 2016-01-27 HISTORY — DX: Paroxysmal atrial fibrillation: I48.0

## 2016-02-15 ENCOUNTER — Emergency Department: Payer: Medicare HMO

## 2016-02-15 ENCOUNTER — Inpatient Hospital Stay
Admission: EM | Admit: 2016-02-15 | Discharge: 2016-02-16 | DRG: 310 | Disposition: A | Discharge status: Home / Self Care | Payer: Medicare HMO | Attending: Internal Medicine | Admitting: Internal Medicine

## 2016-02-15 DIAGNOSIS — R001 Bradycardia, unspecified: Secondary | ICD-10-CM

## 2016-02-15 DIAGNOSIS — Z79899 Other long term (current) drug therapy: Secondary | ICD-10-CM

## 2016-02-15 DIAGNOSIS — I4891 Unspecified atrial fibrillation: Principal | ICD-10-CM | POA: Diagnosis present

## 2016-02-15 DIAGNOSIS — Z7982 Long term (current) use of aspirin: Secondary | ICD-10-CM

## 2016-02-15 DIAGNOSIS — Z8673 Personal history of transient ischemic attack (TIA), and cerebral infarction without residual deficits: Secondary | ICD-10-CM | POA: Diagnosis not present

## 2016-02-15 DIAGNOSIS — I10 Essential (primary) hypertension: Secondary | ICD-10-CM | POA: Diagnosis present

## 2016-02-15 DIAGNOSIS — Z8249 Family history of ischemic heart disease and other diseases of the circulatory system: Secondary | ICD-10-CM

## 2016-02-15 DIAGNOSIS — I129 Hypertensive chronic kidney disease with stage 1 through stage 4 chronic kidney disease, or unspecified chronic kidney disease: Secondary | ICD-10-CM | POA: Diagnosis present

## 2016-02-15 DIAGNOSIS — Z7901 Long term (current) use of anticoagulants: Secondary | ICD-10-CM

## 2016-02-15 DIAGNOSIS — G4700 Insomnia, unspecified: Secondary | ICD-10-CM | POA: Diagnosis present

## 2016-02-15 DIAGNOSIS — Z825 Family history of asthma and other chronic lower respiratory diseases: Secondary | ICD-10-CM | POA: Diagnosis not present

## 2016-02-15 DIAGNOSIS — J45909 Unspecified asthma, uncomplicated: Secondary | ICD-10-CM | POA: Diagnosis present

## 2016-02-15 DIAGNOSIS — M810 Age-related osteoporosis without current pathological fracture: Secondary | ICD-10-CM | POA: Diagnosis present

## 2016-02-15 DIAGNOSIS — I959 Hypotension, unspecified: Secondary | ICD-10-CM | POA: Diagnosis present

## 2016-02-15 DIAGNOSIS — N183 Chronic kidney disease, stage 3 (moderate): Secondary | ICD-10-CM | POA: Diagnosis present

## 2016-02-15 DIAGNOSIS — Z90710 Acquired absence of both cervix and uterus: Secondary | ICD-10-CM

## 2016-02-15 DIAGNOSIS — R42 Dizziness and giddiness: Secondary | ICD-10-CM | POA: Diagnosis present

## 2016-02-15 DIAGNOSIS — Z809 Family history of malignant neoplasm, unspecified: Secondary | ICD-10-CM | POA: Diagnosis not present

## 2016-02-15 DIAGNOSIS — H8149 Vertigo of central origin, unspecified ear: Secondary | ICD-10-CM | POA: Diagnosis not present

## 2016-02-15 DIAGNOSIS — Z880 Allergy status to penicillin: Secondary | ICD-10-CM | POA: Diagnosis not present

## 2016-02-15 DIAGNOSIS — Z888 Allergy status to other drugs, medicaments and biological substances status: Secondary | ICD-10-CM

## 2016-02-15 DIAGNOSIS — R079 Chest pain, unspecified: Secondary | ICD-10-CM

## 2016-02-15 LAB — CBC WITH DIFFERENTIAL/PLATELET
Basophils Absolute: 0 10*3/uL (ref 0–0.1)
Basophils Relative: 0 %
Eosinophils Absolute: 0 10*3/uL (ref 0–0.7)
Eosinophils Relative: 0 %
HCT: 44.6 % (ref 35.0–47.0)
Hemoglobin: 15.4 g/dL (ref 12.0–16.0)
Lymphocytes Relative: 12 %
Lymphs Abs: 1.2 10*3/uL (ref 1.0–3.6)
MCH: 31.5 pg (ref 26.0–34.0)
MCHC: 34.6 g/dL (ref 32.0–36.0)
MCV: 91 fL (ref 80.0–100.0)
Monocytes Absolute: 0.3 10*3/uL (ref 0.2–0.9)
Monocytes Relative: 3 %
Neutro Abs: 8.5 10*3/uL — ABNORMAL HIGH (ref 1.4–6.5)
Neutrophils Relative %: 85 %
Platelets: 313 10*3/uL (ref 150–440)
RBC: 4.9 MIL/uL (ref 3.80–5.20)
RDW: 13.2 % (ref 11.5–14.5)
WBC: 10 10*3/uL (ref 3.6–11.0)

## 2016-02-15 LAB — COMPREHENSIVE METABOLIC PANEL WITH GFR
ALT: 27 U/L (ref 14–54)
AST: 26 U/L (ref 15–41)
Albumin: 4.1 g/dL (ref 3.5–5.0)
Alkaline Phosphatase: 64 U/L (ref 38–126)
Anion gap: 6 (ref 5–15)
BUN: 24 mg/dL — ABNORMAL HIGH (ref 6–20)
CO2: 28 mmol/L (ref 22–32)
Calcium: 9.7 mg/dL (ref 8.9–10.3)
Chloride: 106 mmol/L (ref 101–111)
Creatinine, Ser: 0.98 mg/dL (ref 0.44–1.00)
GFR calc Af Amer: >60 mL/min
GFR calc non Af Amer: 56 mL/min — ABNORMAL LOW
Glucose, Bld: 177 mg/dL — ABNORMAL HIGH (ref 65–99)
Potassium: 4.3 mmol/L (ref 3.5–5.1)
Sodium: 140 mmol/L (ref 135–145)
Total Bilirubin: 0.6 mg/dL (ref 0.3–1.2)
Total Protein: 8.1 g/dL (ref 6.5–8.1)

## 2016-02-15 LAB — TROPONIN I
Troponin I: <0.03 ng/mL
Troponin I: <0.03 ng/mL

## 2016-02-15 LAB — BRAIN NATRIURETIC PEPTIDE: B Natriuretic Peptide: 90 pg/mL (ref 0.0–100.0)

## 2016-02-15 MED ORDER — ONDANSETRON HCL 4 MG/2ML IJ SOLN
INTRAMUSCULAR | Status: AC
  Administered 2016-02-15: 4 mg via INTRAVENOUS
  Filled 2016-02-15: qty 2

## 2016-02-15 MED ORDER — ACETAMINOPHEN 650 MG RE SUPP: 650.0000 mg | Freq: Four times a day (QID) | RECTAL | Status: DC | PRN

## 2016-02-15 MED ORDER — BISACODYL 10 MG RE SUPP: 10.0000 mg | Freq: Every day | RECTAL | Status: DC | PRN

## 2016-02-15 MED ORDER — ASPIRIN 81 MG PO CHEW
324.0000 mg | CHEWABLE_TABLET | Freq: Once | ORAL | Status: AC
  Administered 2016-02-15: 324 mg via ORAL

## 2016-02-15 MED ORDER — DILTIAZEM HCL ER COATED BEADS 180 MG PO CP24
180.0000 mg | ORAL_CAPSULE | Freq: Every day | ORAL | Status: DC
  Filled 2016-02-15: qty 1

## 2016-02-15 MED ORDER — HYDROCODONE-ACETAMINOPHEN 5-325 MG PO TABS: 1.0000 | ORAL_TABLET | ORAL | Status: DC | PRN

## 2016-02-15 MED ORDER — MECLIZINE HCL 25 MG PO TABS: 25.0000 mg | ORAL_TABLET | Freq: Three times a day (TID) | ORAL | Status: DC | PRN

## 2016-02-15 MED ORDER — ONDANSETRON HCL 4 MG PO TABS: 4.0000 mg | ORAL_TABLET | Freq: Four times a day (QID) | ORAL | Status: DC | PRN

## 2016-02-15 MED ORDER — ONDANSETRON HCL 4 MG/2ML IJ SOLN
4.0000 mg | Freq: Once | INTRAMUSCULAR | Status: AC
  Administered 2016-02-15: 4 mg via INTRAVENOUS

## 2016-02-15 MED ORDER — SODIUM CHLORIDE 0.9 % IV SOLN
INTRAVENOUS | Status: DC
  Administered 2016-02-15 – 2016-02-16 (×2): via INTRAVENOUS

## 2016-02-15 MED ORDER — DIGOXIN 0.25 MG/ML IJ SOLN
0.1250 mg | Freq: Once | INTRAMUSCULAR | Status: AC
  Administered 2016-02-15: 0.125 mg via INTRAVENOUS
  Filled 2016-02-15: qty 0.5

## 2016-02-15 MED ORDER — ASPIRIN 81 MG PO CHEW
CHEWABLE_TABLET | ORAL | Status: AC
  Administered 2016-02-15: 324 mg via ORAL
  Filled 2016-02-15: qty 4

## 2016-02-15 MED ORDER — DILTIAZEM HCL 25 MG/5ML IV SOLN
10.0000 mg | Freq: Once | INTRAVENOUS | Status: AC
  Administered 2016-02-15: 10 mg via INTRAVENOUS
  Filled 2016-02-15: qty 5

## 2016-02-15 MED ORDER — DILTIAZEM HCL 60 MG PO TABS
60.0000 mg | ORAL_TABLET | Freq: Four times a day (QID) | ORAL | Status: DC
  Administered 2016-02-15 – 2016-02-16 (×3): 60 mg via ORAL
  Filled 2016-02-15 (×3): qty 1

## 2016-02-15 MED ORDER — PANTOPRAZOLE SODIUM 40 MG PO TBEC
40.0000 mg | DELAYED_RELEASE_TABLET | Freq: Every day | ORAL | Status: DC
  Administered 2016-02-15 – 2016-02-16 (×2): 40 mg via ORAL
  Filled 2016-02-15 (×2): qty 1

## 2016-02-15 MED ORDER — LORAZEPAM 2 MG/ML IJ SOLN
0.5000 mg | Freq: Once | INTRAMUSCULAR | Status: AC
  Administered 2016-02-15: 0.5 mg via INTRAVENOUS
  Filled 2016-02-15: qty 1

## 2016-02-15 MED ORDER — HEPARIN SODIUM (PORCINE) 5000 UNIT/ML IJ SOLN: 5000.0000 [IU] | Freq: Three times a day (TID) | INTRAMUSCULAR | Status: DC

## 2016-02-15 MED ORDER — SODIUM CHLORIDE 0.9% FLUSH
3.0000 mL | Freq: Two times a day (BID) | INTRAVENOUS | Status: DC
  Administered 2016-02-15: 3 mL via INTRAVENOUS

## 2016-02-15 MED ORDER — SODIUM CHLORIDE 0.9 % IV BOLUS (SEPSIS)
500.0000 mL | Freq: Once | INTRAVENOUS | Status: AC
  Administered 2016-02-15: 500 mL via INTRAVENOUS

## 2016-02-15 MED ORDER — APIXABAN 5 MG PO TABS
5.0000 mg | ORAL_TABLET | Freq: Two times a day (BID) | ORAL | Status: DC
  Administered 2016-02-15 – 2016-02-16 (×2): 5 mg via ORAL
  Filled 2016-02-15 (×2): qty 1

## 2016-02-15 MED ORDER — MAGNESIUM SULFATE 2 GM/50ML IV SOLN
2.0000 g | Freq: Once | INTRAVENOUS | Status: AC
  Administered 2016-02-15: 2 g via INTRAVENOUS
  Filled 2016-02-15: qty 50

## 2016-02-15 MED ORDER — ACETAMINOPHEN 325 MG PO TABS: 650.0000 mg | ORAL_TABLET | Freq: Four times a day (QID) | ORAL | Status: DC | PRN

## 2016-02-15 MED ORDER — ALBUTEROL SULFATE (2.5 MG/3ML) 0.083% IN NEBU: 3.0000 mL | INHALATION_SOLUTION | RESPIRATORY_TRACT | Status: DC | PRN

## 2016-02-15 MED ORDER — METOPROLOL TARTRATE 25 MG PO TABS
25.0000 mg | ORAL_TABLET | Freq: Two times a day (BID) | ORAL | Status: DC
  Filled 2016-02-15: qty 1

## 2016-02-15 MED ORDER — ONDANSETRON HCL 4 MG/2ML IJ SOLN: 4.0000 mg | Freq: Four times a day (QID) | INTRAMUSCULAR | Status: DC | PRN

## 2016-02-15 MED ORDER — DOCUSATE SODIUM 100 MG PO CAPS
100.0000 mg | ORAL_CAPSULE | Freq: Two times a day (BID) | ORAL | Status: DC
  Administered 2016-02-16: 100 mg via ORAL
  Filled 2016-02-15 (×2): qty 1

## 2016-02-15 MED ORDER — ASPIRIN EC 81 MG PO TBEC
81.0000 mg | DELAYED_RELEASE_TABLET | Freq: Every day | ORAL | Status: DC
  Administered 2016-02-15: 81 mg via ORAL
  Filled 2016-02-15: qty 1

## 2016-02-15 NOTE — ED Notes (Signed)
Informed RN bed ready  1510

## 2016-02-15 NOTE — ED Provider Notes (Signed)
[facility] Emergency Department Provider Note ____________________________________________   I have reviewed the triage vital signs and the triage nursing note.  HISTORY  Chief Complaint Dizziness   Historian Patient  HPI [name] is a 75 y.o. female history with a complaint of dizziness that occurred this morning when she woke up and is associated with nausea and consistent with prior episode of vertigo with the most  episode was over a year ago. She woke up around 7 AM, and took a meclizine, but she states that it had artery gone too far and she still very symptomatic.  Denies chest pain until she came here in the emergency department and reported some chest pressure. No shortness of breath. No reported palpitations, but found have elevated any irregular heartbeat here.  When she vomits she states that something comes out the other end, but is unsure diarrhea.   Past Medical History:  Diagnosis Date  . Acute bronchitis   . Chronic kidney disease (CKD), stage III (moderate)   . Hypertension   . Insomnia, unspecified   . Osteoporosis, unspecified   . Pure hypercholesterolemia   . Unspecified asthma(493.90)   . Unspecified transient cerebral ischemia     Patient Active Problem List   Diagnosis Date Noted  . Medication monitoring encounter 01/26/2016  . Hx of colonic polyps   . Benign neoplasm of sigmoid colon   . Asthma, mild intermittent 02/21/2015  . Hyperglycemia 02/21/2015  . Encounter for immunization 02/21/2015  . Need for influenza vaccination 02/21/2015  . Essential hypertension 02/20/2015  . Inconclusive mammogram due to dense breasts 01/02/2015  . Dense breast 01/02/2015  . Colonic polyp 01/02/2015  . Abnormal EKG 02/23/2013  . Sinus bradycardia 02/23/2013    Past Surgical History:  Procedure Laterality Date  . ABDOMINAL HYSTERECTOMY    . COLONOSCOPY WITH PROPOFOL N/A 03/04/2015   Procedure: COLONOSCOPY WITH PROPOFOL;   Surgeon: Lucilla Lame, MD;  Location: [facility] ENDOSCOPY;  Service: Endoscopy;  Laterality: N/A;    Prior to Admission medications   Medication Sig Start Date End Date Taking? Authorizing Provider  albuterol (PROVENTIL HFA;VENTOLIN HFA) 108 (90 Base) MCG/ACT inhaler Inhale 2 puffs into the lungs every 4 (four) hours as needed for wheezing or shortness of breath. 01/26/16   Arnetha Courser, MD  amLODipine (NORVASC) 10 MG tablet Take 1 tablet (10 mg total) by mouth daily. 01/06/16   Arnetha Courser, MD  aspirin 81 MG tablet Take 81 mg by mouth daily.    Historical Provider, MD  hydrochlorothiazide (HYDRODIURIL) 25 MG tablet Take 1 tablet (25 mg total) by mouth daily. 01/06/16   Arnetha Courser, MD  meclizine (ANTIVERT) 25 MG tablet Take 1 tablet (25 mg total) by mouth every 8 (eight) hours as needed. 01/26/16   Arnetha Courser, MD    Allergies  Allergen Reactions  . Ace Inhibitors   . Penicillins Anxiety    Family History  Problem Relation Age of Onset  . COPD Father   . Hypertension Mother   . Hypertension Brother   . Cancer Brother     Social History Social History  Substance Use Topics  . Smoking status: Never Smoker  . Smokeless tobacco: Never Used  . Alcohol use No    Review of Systems  Constitutional: Negative for fever. Eyes: Negative for visual changes. ENT: Negative for sore throat. Cardiovascular: Negative for chest pain. Respiratory: Negative for shortness of breath. Gastrointestinal: Negative for abdominal pain. Genitourinary: Negative for dysuria. Musculoskeletal:  Negative for back pain. Skin: Negative for rash. Neurological: Negative for headache. 10 point Review of Systems otherwise negative ____________________________________________   PHYSICAL EXAM:  VITAL SIGNS: ED Triage Vitals  Enc Vitals Group     BP 02/15/16 1113 (!) 136/108     Pulse Rate 02/15/16 1113 75     Resp --      Temp 02/15/16 1113 97.5 F (36.4 C)     Temp Source 02/15/16 1113 Oral      SpO2 02/15/16 1113 99 %     Weight 02/15/16 1122 184 lb (83.5 kg)     Height 02/15/16 1122 5\' 3"  (1.6 m)     Head Circumference --      Peak Flow --      Pain Score --      Pain Loc --      Pain Edu? --      Excl. in [place]? --      Constitutional: Alert and oriented. Well appearing and in no distress.  Lying on her side. HEENT   Head: Normocephalic and atraumatic.      Eyes: Conjunctivae are normal. PERRL. Normal extraocular movements.      Ears:         Nose: No congestion/rhinnorhea.   Mouth/Throat: Mucous membranes are moist.   Neck: No stridor. Cardiovascular/Chest: Normal rate, regular rhythm.  No murmurs, rubs, or gallops. Respiratory: Normal respiratory effort without tachypnea nor retractions. Breath sounds are clear and equal bilaterally. No wheezes/rales/rhonchi. Gastrointestinal: Soft. No distention, no guarding, no rebound. Nontender.    Genitourinary/rectal:Deferred Musculoskeletal: Nontender with normal range of motion in all extremities. No joint effusions.  No lower extremity tenderness.  No edema. Neurologic:  Cranial nerves II through X intact.Normal speech and language. 5 out of 5 strength in 4 extremities.  No gross or focal neurologic deficits are appreciated. Skin:  Skin is warm, dry and intact. No rash noted. Psychiatric: Mood and affect are normal. Speech and behavior are normal. Patient exhibits appropriate insight and judgment.  ____________________________________________   EKG I, Lisa Roca, MD, the attending physician have personally viewed and interpreted all ECGs.  131 bpm, undetermined rhythm, but suspect A. Fib with rapid ventricular response given theirregularly irregular tachycardia. Nonspecific intraventricular conduction delay. Nonspecific T-wave with minimal ST segment depression inferiorly ____________________________________________  LABS (pertinent positives/negatives)  Labs Reviewed  COMPREHENSIVE METABOLIC PANEL - Abnormal;  Notable for the following:       Result Value   Glucose, Bld 177 (*)    BUN 24 (*)    GFR calc non Af Amer 56 (*)    All other components within normal limits  CBC WITH DIFFERENTIAL/PLATELET - Abnormal; Notable for the following:    Neutro Abs 8.5 (*)    All other components within normal limits  BRAIN NATRIURETIC PEPTIDE  TROPONIN I    ____________________________________________  RADIOLOGY All Xrays were viewed by me. Imaging interpreted by Radiologist.  Chest x-ray:  Mild cardiomegaly without acute disease  CT head without contrast: Normal head CT __________________________________________  PROCEDURES  Procedure(s) performed: None  Critical Care performed: CRITICAL CARE Performed by: Lisa Roca   Total critical care time: 30 minutes  Critical care time was exclusive of separately billable procedures and treating other patients.  Critical care was necessary to treat or prevent imminent or life-threatening deterioration.  Critical care was time spent personally by me on the following activities: development of treatment plan with patient and/or surrogate as well as nursing, discussions with consultants, evaluation of  patient's response to treatment, examination of patient, obtaining history from patient or surrogate, ordering and performing treatments and interventions, ordering and review of laboratory studies, ordering and review of radiographic studies, pulse oximetry and re-evaluation of patient's condition.   ____________________________________________   ED COURSE / ASSESSMENT AND PLAN  Pertinent labs & imaging results that were available during my care of the patient were reviewed by me and considered in my medical decision making (see chart for details).   Ms. Biddix has a history of prior vertigo causind presents here with a similar episode to prior episodes. However, meclizine did not alleviate her symptoms, so she is here for evaluation. Upon arrival  here she was found to have irregularly irregular tachycardic hea atrial fibrillation. She also complained of some chest discomfort/pressure.  In terms of the dizziness, does in many ways sounds like vertigo, patient will be given a small dose of Ativan as meclizine was tried earlier without success.  However given the fact that she now has irregularly irregular rhythm, raising the potential possibility for stroke, I will check a noncontrast head CT. Ultimately she does not have any additional neurologic findings or deficits or complaints making me highly suspicious for neurologic abnormality.  In terms of the irregularly irregular and tachycardic rhythm, patient will begiven diltiazem. She will need hospital admission with new onset atrial fibrillation and chest discomfort. She was given aspirin for this.  CT head negative. Nausea/dizziness improved after dose of Ativan.    CONSULTATIONS:  I spoke with Dr. Doy Hutching, hospitalist for admission.  Patient / Family / Caregiver informed of clinical course, medical decision-making process, and agree with plan.   I discussed return precautions, follow-up instructions, and discharged instructions with patient and/or family.   ___________________________________________   FINAL CLINICAL IMPRESSION(S) / ED DIAGNOSES   Final diagnoses:  New onset atrial fibrillation ([place])  Atrial fibrillation with rapid ventricular response ([place])  Dizziness              Note: This dictation was prepared with Dragon dictation. Any transcriptional errors that result from this process are unintentional    Lisa Roca, MD 02/15/16 1347

## 2016-02-15 NOTE — Consult Note (Signed)
CARDIOLOGY CONSULT NOTE       [name] MRN: [id number] DOB/AGE: [birth year] 75 y.o.  Admit date: 02/15/2016 Referring Physician:  Doy Hutching Primary Physician: Ashok Norris, MD Primary Cardiologist:  Fletcher Anon Reason for Consultation:  Afib  Principal Problem:   Atrial fibrillation with RVR [facility]) Active Problems:   Chest pain   Hypotension   Dizziness   HPI:  75 y.o. admitted with vertigo and found to have rapid afib. Seen by Dr Fletcher Anon in 2014 for bradycardia asymptomatic in 50's so patient would appear to have SSS.  She has had long standing vertigo and describes seeing multiple specialist in past including at [facility].  She felt fine last night but awoke with dizziness, poor balance nausea and vomiting .  She tried to take meclizine but it would not stay down. No focal neurologic symptoms no headache or history  Migraines. She denies SSCP, palpitations syncope or dyspnea. Had transient hypotension in ER with bolus of iv cardizem. Has had angioedema in past with ACE inhibitors. Currently feels much better but still has not taken anything PO today. Telemetry currently afib rate 100-110 No contraindications to anticoagulation.  This patients CHA2DS2-VASc Score and unadjusted Ischemic Stroke Rate (% per year) is equal to 3.2 % stroke rate/year from a score of 3  Above score calculated as 1 point each if present [CHF, HTN, DM, Vascular=MI/PAD/Aortic Plaque, Age if 65-74, or Female] Above score calculated as 2 points each if present [Age > 75, or Stroke/TIA/TE]    ROS All other systems reviewed and negative except as noted above  Past Medical History:  Diagnosis Date  . Acute bronchitis   . Chronic kidney disease (CKD), stage III (moderate)   . Hypertension   . Insomnia, unspecified   . Osteoporosis, unspecified   . Pure hypercholesterolemia   . Unspecified asthma(493.90)   . Unspecified transient cerebral ischemia     Family History  Problem Relation Age of Onset    . COPD Father   . Hypertension Mother   . Hypertension Brother   . Cancer Brother     Social History   Social History  . Marital status: Single    Spouse name: N/A  . Number of children: N/A  . Years of education: N/A   Occupational History  . Not on file.   Social History Main Topics  . Smoking status: Never Smoker  . Smokeless tobacco: Never Used  . Alcohol use No  . Drug use: No  . Sexual activity: Not Currently   Other Topics Concern  . Not on file   Social History Narrative  . No narrative on file    Past Surgical History:  Procedure Laterality Date  . ABDOMINAL HYSTERECTOMY    . COLONOSCOPY WITH PROPOFOL N/A 03/04/2015   Procedure: COLONOSCOPY WITH PROPOFOL;  Surgeon: Lucilla Lame, MD;  Location: [facility] ENDOSCOPY;  Service: Endoscopy;  Laterality: N/A;     . apixaban  5 mg Oral BID  . aspirin EC  81 mg Oral QHS  . digoxin  0.125 mg Intravenous Once  . diltiazem  180 mg Oral Daily  . docusate sodium  100 mg Oral BID  . magnesium sulfate 1 - 4 g bolus IVPB  2 g Intravenous Once  . metoprolol tartrate  25 mg Oral BID  . pantoprazole  40 mg Oral Daily  . sodium chloride flush  3 mL Intravenous Q12H   . sodium chloride      Physical Exam: Blood pressure 122/72, pulse [name])  109, temperature 97.6 F (36.4 C), temperature source Oral, resp. rate 20, height 5\' 3"  (1.6 m), weight 180 lb 11.2 oz (82 kg), SpO2 95 %.    Affect appropriate Healthy:  appears stated age 75: normal Neck supple with no adenopathy JVP normal no bruits no thyromegaly Lungs clear with no wheezing and good diaphragmatic motion Heart:  S1/S2 no murmur, no rub, gallop or click PMI normal Abdomen: benighn, BS positve, no tenderness, no AAA no bruit.  No HSM or HJR Distal pulses intact with no bruits No edema Neuro non-focal Skin warm and dry No muscular weakness   Labs:   Lab Results  Component Value Date   WBC 10.0 02/15/2016   HGB 15.4 02/15/2016   HCT 44.6 02/15/2016   MCV  91.0 02/15/2016   PLT 313 02/15/2016    Recent Labs Lab 02/15/16 1146  NA 140  K 4.3  CL 106  CO2 28  BUN 24*  CREATININE 0.98  CALCIUM 9.7  PROT 8.1  BILITOT 0.6  ALKPHOS 64  ALT 27  AST 26  GLUCOSE 177*   Lab Results  Component Value Date   CKTOTAL 143 01/13/2008   CKMB 1.7 01/13/2008   TROPONINI <0.03 02/15/2016    Lab Results  Component Value Date   CHOL 159 01/26/2016   CHOL 172 02/28/2015   Lab Results  Component Value Date   HDL 34 (L) 01/26/2016   Lab Results  Component Value Date   LDLCALC 103 01/26/2016   Lab Results  Component Value Date   TRIG 108 01/26/2016   Lab Results  Component Value Date   CHOLHDL 4.7 01/26/2016   No results found for: LDLDIRECT    Radiology: Ct Head Wo Contrast  Result Date: 02/15/2016 CLINICAL DATA:  Dizziness.  Vertigo. EXAM: CT HEAD WITHOUT CONTRAST TECHNIQUE: Contiguous axial images were obtained from the base of the skull through the vertex without intravenous contrast. COMPARISON:  Brain MR 05/21/2013.  CT 11/17/2005. FINDINGS: Brain: No mass lesion, hemorrhage, hydrocephalus, acute infarct, intra-axial, or extra-axial fluid collection. Vascular: No hyperdense vessel or unexpected calcification. Skull: Normal Sinuses/Orbits: Normal orbits and globes. Clear paranasal sinuses and mastoid air cells. Other: None IMPRESSION: Normal head CT. Electronically Signed   By: Abigail Miyamoto M.D.   On: 02/15/2016 13:20   Dg Chest Port 1 View  Result Date: 02/15/2016 CLINICAL DATA:  Vertigo, non smoker, denies heart/lung disease EXAM: PORTABLE CHEST 1 VIEW COMPARISON:  04/10/2009 FINDINGS: Midline trachea. Mild cardiomegaly. No pleural effusion or pneumothorax. Clear lungs. IMPRESSION: Mild cardiomegaly, without acute disease. Electronically Signed   By: Abigail Miyamoto M.D.   On: 02/15/2016 12:49    EKG:  afib nonspecific ST changes   ASSESSMENT AND PLAN:  Afib:  Currently written for cardizem and lopressor orally for rate control  with history of bradycardia would use one agent in short acting manner to start. Will write for cardizem 60 mg q6.  Can likely tolerate iv cardizem if needed with No bolus and low dose drip such as 5mg /hr especially now since given iv bolus and will likely be able to eat dinner Agree with initiation of eliquis and echo in am.    HTN:  Well controlled.  Continue current medications and low sodium Dash type diet.  DC norvasc and diuretic for now  Vertigo:  Continue meclizine and PRN zofran. Would benefit from outpatient vestibular PT/OT   Signed: [name] 02/15/2016, 4:20 PM

## 2016-02-15 NOTE — H&P (Signed)
History and Physical    ISMAEL WNEK [id number] DOB: 1940-09-15 DOA: 02/15/2016  Referring physician: Dr. Reita Cliche PCP: Ashok Norris, MD  Specialists: none  Chief Complaint: dizziness  HPI: SYNIAH DIENES is a 75 y.o. female has a past medical history significant for HTN and CKD who presents to ER with dizziness. In ER, pt found to be in rapid a-fib. Also c/o chest pain and SOB. Some orthopnea. No fever. No cardiac hx. She is now admitted. She is hypotensive after Cardizem bolus in ER. She remains in A-fib  Review of Systems: The patient denies anorexia, fever, weight loss,, vision loss, decreased hearing, hoarseness,  syncope, peripheral edema, balance deficits, hemoptysis, abdominal pain, melena, hematochezia, severe indigestion/heartburn, hematuria, incontinence, genital sores, muscle weakness, suspicious skin lesions, transient blindness, difficulty walking, depression, unusual weight change, abnormal bleeding, enlarged lymph nodes, angioedema, and breast masses.   Past Medical History:  Diagnosis Date  . Acute bronchitis   . Chronic kidney disease (CKD), stage III (moderate)   . Hypertension   . Insomnia, unspecified   . Osteoporosis, unspecified   . Pure hypercholesterolemia   . Unspecified asthma(493.90)   . Unspecified transient cerebral ischemia    Past Surgical History:  Procedure Laterality Date  . ABDOMINAL HYSTERECTOMY    . COLONOSCOPY WITH PROPOFOL N/A 03/04/2015   Procedure: COLONOSCOPY WITH PROPOFOL;  Surgeon: Lucilla Lame, MD;  Location: [facility] ENDOSCOPY;  Service: Endoscopy;  Laterality: N/A;   Social History:  reports that she has never smoked. She has never used smokeless tobacco. She reports that she does not drink alcohol or use drugs.  Allergies  Allergen Reactions  . Ace Inhibitors Swelling    Patient states her tongue swell.  [name] Penicillins Anxiety    Has patient had a PCN reaction causing immediate rash, facial/tongue/throat swelling, SOB or  lightheadedness with hypotension: No Has patient had a PCN reaction causing severe rash involving mucus membranes or skin necrosis: No Has patient had a PCN reaction that required hospitalization No Has patient had a PCN reaction occurring within the last 10 years: No If all of the above answers are "NO", then may proceed with Cephalosporin use.    Family History  Problem Relation Age of Onset  . COPD Father   . Hypertension Mother   . Hypertension Brother   . Cancer Brother     Prior to Admission medications   Medication Sig Start Date End Date Taking? Authorizing Provider  albuterol (PROVENTIL HFA;VENTOLIN HFA) 108 (90 Base) MCG/ACT inhaler Inhale 2 puffs into the lungs every 4 (four) hours as needed for wheezing or shortness of breath. 01/26/16  Yes Arnetha Courser, MD  amLODipine (NORVASC) 10 MG tablet Take 1 tablet (10 mg total) by mouth daily. Patient taking differently: Take 10 mg by mouth at bedtime.  01/06/16  Yes Arnetha Courser, MD  aspirin EC 81 MG tablet Take 81 mg by mouth at bedtime.   Yes Historical Provider, MD  hydrochlorothiazide (HYDRODIURIL) 25 MG tablet Take 1 tablet (25 mg total) by mouth daily. Patient taking differently: Take 25 mg by mouth at bedtime.  01/06/16  Yes Arnetha Courser, MD  meclizine (ANTIVERT) 25 MG tablet Take 1 tablet (25 mg total) by mouth every 8 (eight) hours as needed. 01/26/16  Yes Arnetha Courser, MD   Physical Exam: Vitals:   02/15/16 1122 02/15/16 1130 02/15/16 1200 02/15/16 1230  BP:  104/75 110/75 117/65  Pulse:  (!) 121 60 63  Resp:  (!) 23  13 14  Temp:      TempSrc:      SpO2:  96% 96% 98%  Weight: 83.5 kg (184 lb)     Height: 5\' 3"  (1.6 m)        General:  No apparent distress, WDWN, [place]/AT  Eyes: PERRL, EOMI, no scleral icterus, conjunctiva clear  ENT: moist oropharynx without exudate, TM's benign, dentition good  Neck: supple, no lymphadenopathy. No bruits or thyromegaly  Cardiovascular: irregularly irregular without MRG;  2+ peripheral pulses, no JVD, no peripheral edema  Respiratory: CTA biL, good air movement without wheezing, rhonchi or crackled. Respiratory effort normal  Abdomen: soft, non tender to palpation, positive bowel sounds, no guarding, no rebound  Skin: no rashes or lesions  Musculoskeletal: normal bulk and tone, no joint swelling  Psychiatric: normal mood and affect, A&OX3  Neurologic: CN 2-12 grossly intact, Motor strength 5/5 in all 4 groups with symmetric DTR's and non-focal sensory exam  Labs on Admission:  Basic Metabolic Panel:  Recent Labs Lab 02/15/16 1146  NA 140  K 4.3  CL 106  CO2 28  GLUCOSE 177*  BUN 24*  CREATININE 0.98  CALCIUM 9.7   Liver Function Tests:  Recent Labs Lab 02/15/16 1146  AST 26  ALT 27  ALKPHOS 64  BILITOT 0.6  PROT 8.1  ALBUMIN 4.1   No results for input(s): LIPASE, AMYLASE in the last 168 hours. No results for input(s): AMMONIA in the last 168 hours. CBC:  Recent Labs Lab 02/15/16 1146  WBC 10.0  NEUTROABS 8.5*  HGB 15.4  HCT 44.6  MCV 91.0  PLT 313   Cardiac Enzymes:  Recent Labs Lab 02/15/16 1146  TROPONINI <0.03    BNP (last 3 results)  Recent Labs  02/15/16 1146  BNP 90.0    ProBNP (last 3 results) No results for input(s): PROBNP in the last 8760 hours.  CBG: No results for input(s): GLUCAP in the last 168 hours.  Radiological Exams on Admission: Ct Head Wo Contrast  Result Date: 02/15/2016 CLINICAL DATA:  Dizziness.  Vertigo. EXAM: CT HEAD WITHOUT CONTRAST TECHNIQUE: Contiguous axial images were obtained from the base of the skull through the vertex without intravenous contrast. COMPARISON:  Brain MR 05/21/2013.  CT 11/17/2005. FINDINGS: Brain: No mass lesion, hemorrhage, hydrocephalus, acute infarct, intra-axial, or extra-axial fluid collection. Vascular: No hyperdense vessel or unexpected calcification. Skull: Normal Sinuses/Orbits: Normal orbits and globes. Clear paranasal sinuses and mastoid air  cells. Other: None IMPRESSION: Normal head CT. Electronically Signed   By: Abigail Miyamoto M.D.   On: 02/15/2016 13:20   Dg Chest Port 1 View  Result Date: 02/15/2016 CLINICAL DATA:  Vertigo, non smoker, denies heart/lung disease EXAM: PORTABLE CHEST 1 VIEW COMPARISON:  04/10/2009 FINDINGS: Midline trachea. Mild cardiomegaly. No pleural effusion or pneumothorax. Clear lungs. IMPRESSION: Mild cardiomegaly, without acute disease. Electronically Signed   By: Abigail Miyamoto M.D.   On: 02/15/2016 12:49    EKG: Independently reviewed.  Assessment/Plan Principal Problem:   Atrial fibrillation with RVR (HCC) Active Problems:   Chest pain   Hypotension   Dizziness   Will admit to tele with IV fluids and po lopressor and Eliquis. Check TSH. Follow enzymes. Check echo. Consult Cardiology. Will give 1 time dose of Digoxin and empiric IV magnesium. Repeat labs in AM.  Diet: clear liquids Fluids: NS@100  DVT Prophylaxis: Eliquis  Code Status: FULL  Family Communication: yes  Disposition Plan: home  Time spent: 50 min

## 2016-02-15 NOTE — Progress Notes (Signed)
Anticoag Monitoring: Patient is a 75yo female admitted for a-fib. Orders for Apixaban 5mg  bid and Heparin 5000 units SQ q8h. Will discontinue Heparin per policy.  Paulina Fusi, PharmD, BCPS 02/15/2016 3:52 PM

## 2016-02-15 NOTE — ED Triage Notes (Signed)
Pt to ED from home via [facility] c/o dizziness. Per EMS pt has hx of vertigo and has been dizzy since last evening with NVD. Pt alert and oriented, denies pain, and in no acute distress at this time.

## 2016-02-16 ENCOUNTER — Inpatient Hospital Stay (HOSPITAL_COMMUNITY)
Admit: 2016-02-16 | Discharge: 2016-02-16 | Disposition: A | Discharge status: Home / Self Care | Payer: Medicare HMO | Attending: Internal Medicine | Admitting: Internal Medicine

## 2016-02-16 ENCOUNTER — Telehealth: Payer: Self-pay

## 2016-02-16 DIAGNOSIS — I4891 Unspecified atrial fibrillation: Secondary | ICD-10-CM

## 2016-02-16 DIAGNOSIS — R001 Bradycardia, unspecified: Secondary | ICD-10-CM

## 2016-02-16 LAB — COMPREHENSIVE METABOLIC PANEL WITH GFR
ALT: 21 U/L (ref 14–54)
AST: 21 U/L (ref 15–41)
Albumin: 3.1 g/dL — ABNORMAL LOW (ref 3.5–5.0)
Alkaline Phosphatase: 49 U/L (ref 38–126)
Anion gap: 5 (ref 5–15)
BUN: 14 mg/dL (ref 6–20)
CO2: 24 mmol/L (ref 22–32)
Calcium: 8.4 mg/dL — ABNORMAL LOW (ref 8.9–10.3)
Chloride: 111 mmol/L (ref 101–111)
Creatinine, Ser: 0.87 mg/dL (ref 0.44–1.00)
GFR calc Af Amer: >60 mL/min
GFR calc non Af Amer: >60 mL/min
Glucose, Bld: 106 mg/dL — ABNORMAL HIGH (ref 65–99)
Potassium: 3.7 mmol/L (ref 3.5–5.1)
Sodium: 140 mmol/L (ref 135–145)
Total Bilirubin: 0.5 mg/dL (ref 0.3–1.2)
Total Protein: 6.3 g/dL — ABNORMAL LOW (ref 6.5–8.1)

## 2016-02-16 LAB — CBC
HCT: 39.4 % (ref 35.0–47.0)
Hemoglobin: 13.7 g/dL (ref 12.0–16.0)
MCH: 31.4 pg (ref 26.0–34.0)
MCHC: 34.7 g/dL (ref 32.0–36.0)
MCV: 90.7 fL (ref 80.0–100.0)
Platelets: 288 10*3/uL (ref 150–440)
RBC: 4.35 MIL/uL (ref 3.80–5.20)
RDW: 13.7 % (ref 11.5–14.5)
WBC: 8.3 10*3/uL (ref 3.6–11.0)

## 2016-02-16 LAB — THYROID PANEL WITH TSH
Free Thyroxine Index: 1.3 (ref 1.2–4.9)
T3 Uptake Ratio: 22 % — ABNORMAL LOW (ref 24–39)
T4, Total: 5.7 ug/dL (ref 4.5–12.0)
TSH: 1.19 u[IU]/mL (ref 0.450–4.500)

## 2016-02-16 LAB — ECHOCARDIOGRAM COMPLETE
Height: 63 in
Weight: 2942.4 [oz_av]

## 2016-02-16 LAB — TROPONIN I
Troponin I: <0.03 ng/mL
Troponin I: <0.03 ng/mL

## 2016-02-16 MED ORDER — DILTIAZEM HCL ER COATED BEADS 180 MG PO CP24: 180.0000 mg | ORAL_CAPSULE | Freq: Every day | ORAL | Status: DC

## 2016-02-16 MED ORDER — DILTIAZEM HCL ER COATED BEADS 180 MG PO CP24: 180.0000 mg | ORAL_CAPSULE | Freq: Every day | ORAL | 0 refills | Status: DC

## 2016-02-16 MED ORDER — APIXABAN 5 MG PO TABS: 5.0000 mg | ORAL_TABLET | Freq: Two times a day (BID) | ORAL | 0 refills | Status: DC

## 2016-02-16 MED ORDER — DILTIAZEM HCL 30 MG PO TABS: 30.0000 mg | ORAL_TABLET | Freq: Four times a day (QID) | ORAL | 3 refills | Status: AC | PRN

## 2016-02-16 MED ORDER — HYDROCORTISONE 1 % EX CREA
TOPICAL_CREAM | Freq: Three times a day (TID) | CUTANEOUS | Status: DC | PRN
  Administered 2016-02-16: 1 "application " via TOPICAL
  Filled 2016-02-16: qty 28

## 2016-02-16 NOTE — Progress Notes (Signed)
*  PRELIMINARY RESULTS* Echocardiogram 2D Echocardiogram has been performed.  [name] 02/16/2016, 8:46 AM

## 2016-02-16 NOTE — Telephone Encounter (Signed)
-----   Message from Blain Pais sent at 02/16/2016  2:43 PM EDT ----- Regarding: tcm/ph 9/13 10:30 Christell Faith, PA-C

## 2016-02-16 NOTE — Progress Notes (Signed)
Patient Name: [name] Date of Encounter: 02/16/2016  Hospital Problem List     Principal Problem:   Atrial fibrillation with RVR [facility]) Active Problems:   Essential hypertension   Chest pain   Hypotension   Dizziness   Bradycardia    Patient Summary     75 y/o ? with a h/o HTN, vertigo, and asymptomatic bradycardia, who was admitted 8/20 with dizziness/recurrent vertigo, and found to be in AF with RVR.  Subjective   Rate now controlled on PO dilt.  Freq pauses of up to 2.56 seconds - asymptomatic.  Overall feels better.  Vertiginous Ss have resolved.  No chest pain.  Inpatient Medications    . apixaban  5 mg Oral BID  . aspirin EC  81 mg Oral QHS  . diltiazem  60 mg Oral Q6H  . docusate sodium  100 mg Oral BID  . pantoprazole  40 mg Oral Daily  . sodium chloride flush  3 mL Intravenous Q12H    Vital Signs    Vitals:   02/16/16 0019 02/16/16 0019 02/16/16 0021 02/16/16 0516  BP:  105/69 119/78 102/66  Pulse: 92 (!) 47 (!) 102 (!) 117  Resp:    18  Temp:    98.2 F (36.8 C)  TempSrc:    Oral  SpO2:  97% 95% 95%  Weight:    183 lb 14.4 oz (83.4 kg)  Height:        Intake/Output Summary (Last 24 hours) at 02/16/16 0906 Last data filed at 02/16/16 0700  Gross per 24 hour  Intake          1196.67 ml  Output                0 ml  Net          1196.67 ml   Filed Weights   02/15/16 1122 02/15/16 1534 02/16/16 0516  Weight: 184 lb (83.5 kg) 180 lb 11.2 oz (82 kg) 183 lb 14.4 oz (83.4 kg)    Physical Exam    GEN: Well nourished, well developed, in no acute distress.  HEENT: normal.  Neck: Supple, no JVD, carotid bruits, or masses. Cardiac: IR, IR - rate-controlled, no murmurs, rubs, or gallops. No clubbing, cyanosis, edema.  Radials/DP/PT 2+ and equal bilaterally.  Respiratory:  Respirations regular and unlabored, clear to auscultation bilaterally. GI: Soft, nontender, nondistended, BS + x 4. MS: no deformity or atrophy. Skin: warm and dry, no  rash. Neuro:  Strength and sensation are intact. Psych: Normal affect.  Labs    CBC  Recent Labs  02/15/16 1146 02/16/16 0525  WBC 10.0 8.3  NEUTROABS 8.5*  --   HGB 15.4 13.7  HCT 44.6 39.4  MCV 91.0 90.7  PLT 313 [id number]   Basic Metabolic Panel  Recent Labs  02/15/16 1146 02/16/16 0525  NA 140 140  K 4.3 3.7  CL 106 111  CO2 28 24  GLUCOSE 177* 106*  BUN 24* 14  CREATININE 0.98 0.87  CALCIUM 9.7 8.4*   Liver Function Tests  Recent Labs  02/15/16 1146 02/16/16 0525  AST 26 21  ALT 27 21  ALKPHOS 64 49  BILITOT 0.6 0.5  PROT 8.1 6.3*  ALBUMIN 4.1 3.1*   Cardiac Enzymes  Recent Labs  02/15/16 1747 02/16/16 0008 02/16/16 0525  TROPONINI <0.03 <0.03 <0.03   Thyroid Function Tests  Recent Labs  02/15/16 1146  TSH 1.190  T4TOTAL 5.7    Telemetry  Afib, currently 70's to 80's.  Frequent pauses - up to 2.56 seconds.  Radiology    Ct Head Wo Contrast  Result Date: 02/15/2016 CLINICAL DATA:  Dizziness.  Vertigo. EXAM: CT HEAD WITHOUT CONTRAST TECHNIQUE: Contiguous axial images were obtained from the base of the skull through the vertex without intravenous contrast. COMPARISON:  Brain MR 05/21/2013.  CT 11/17/2005. FINDINGS: Brain: No mass lesion, hemorrhage, hydrocephalus, acute infarct, intra-axial, or extra-axial fluid collection. Vascular: No hyperdense vessel or unexpected calcification. Skull: Normal Sinuses/Orbits: Normal orbits and globes. Clear paranasal sinuses and mastoid air cells. Other: None IMPRESSION: Normal head CT. Electronically Signed   By: Abigail Miyamoto M.D.   On: 02/15/2016 13:20   Dg Chest Port 1 View  Result Date: 02/15/2016 CLINICAL DATA:  Vertigo, non smoker, denies heart/lung disease EXAM: PORTABLE CHEST 1 VIEW COMPARISON:  04/10/2009 FINDINGS: Midline trachea. Mild cardiomegaly. No pleural effusion or pneumothorax. Clear lungs. IMPRESSION: Mild cardiomegaly, without acute disease. Electronically Signed   By: Abigail Miyamoto M.D.    On: 02/15/2016 12:49    Assessment & Plan    1.  AFib RVR:  Pt presented on 8/20 with vertiginous Ss and was found to be in AF with rvr.  Maintained on PO dilt overnight with good rate-control.  Currently asymptomatic.  She has had pauses up to 2.56 seconds on tele (also asymptomatic).  Echo performed - read pending.  Provided that echo looks ok, plan to ambulate and d/c today if HRs stable.  I will consolidate dilt to 180 mg daily (slightly less than current 60 q6).  CHA2DS2VASc = 3.  Eliquis initiated.  Plan three weeks of therapeutic anticoagulation followed by outpt DCCV if she does not convert before hand.  I do have some concern for tachybrady syndrome as baseline bradycardia when previously in sinus may limit our ability to use AVN blocking agents, thus requiring PPM @ sometime in the future.  F/u in office w/in next few wks.  2.  Essential Hypertension/Hypotension:  H/o HTN on HCTZ & amlodipine @ home.  Developed hypotension following dilt bolus in ED.  BP's have since been stable on PO dilt.  Would not use HCTZ or amlodipine @ d/c.  3.  Asymptomatic Bradycardia:  See #1.  ? Tachy-brady.  4.  Vertigo:  Acute on chronic.  Feels better now.  Per IM.  Signed, Murray Hodgkins NP

## 2016-02-16 NOTE — Progress Notes (Signed)
Notified by central telemetry of patient having pauses, 2.6 seconds was the longest noted. Notified Ignacia Bayley NP with cardiology and he will follow up.

## 2016-02-16 NOTE — Telephone Encounter (Signed)
Attempted to contact pt regarding discharge from [facility] on 02/16/16. Left message asking pt to call back regarding discharge instructions and/or medications. Advised pt of appt w/ Christell Faith, PA on 03/10/16 at 10:30 w/ [facility]. Asked pt to call back if unable to keep this appt.

## 2016-02-16 NOTE — Discharge Instructions (Addendum)
°  DIET:  Cardiac diet  DISCHARGE CONDITION:  Stable  ACTIVITY:  Activity as tolerated  OXYGEN:  Home Oxygen: No.   Oxygen Delivery: room air  DISCHARGE LOCATION:  home   If you experience worsening of your admission symptoms, develop shortness of breath, life threatening emergency, suicidal or homicidal thoughts you must seek medical attention immediately by calling 911 or calling your MD immediately  if symptoms less severe.  You Must read complete instructions/literature along with all the possible adverse reactions/side effects for all the Medicines you take and that have been prescribed to you. Take any new Medicines after you have completely understood and accpet all the possible adverse reactions/side effects.   Please note  You were cared for by a hospitalist during your hospital stay. If you have any questions about your discharge medications or the care you received while you were in the hospital after you are discharged, you can call the unit and asked to speak with the hospitalist on call if the hospitalist that took care of you is not available. Once you are discharged, your primary care physician will handle any further medical issues. Please note that NO REFILLS for any discharge medications will be authorized once you are discharged, as it is imperative that you return to your primary care physician (or establish a relationship with a primary care physician if you do not have one) for your aftercare needs so that they can reassess your need for medications and monitor your lab values.   Take cardizem if you notice your Heart rate is >110/min

## 2016-02-16 NOTE — Progress Notes (Signed)
    S:  Pt converted to sinus rhythm with rates initially in the 30's - jxnl and sinus brady.  Currently in the low to mid 40's.  Asymptomatic.  Last received 60 of dilt @ 6am. O:   Vitals:   02/16/16 0516 02/16/16 0916  BP: 102/66 114/63  Pulse: (!) 117 (!) 128  Resp: 18 16  Temp: 98.2 F (36.8 C)     A/P: 1.  PAF w/ h/o sinus bradycardia:  Pt converted to sinus.  No significant or symptomatic post-termination pauses, though rates are currently trending in the low to mid 40's after initially running in the 30's.  Difficult situation and as previously noted, we have concern for tachybrady syndrome.  I had previously ordered dilt cd 180 daily, however I have now d/c'd this.  As suspected, we will not be able to use around the clock AVN blocking agents.  Ambulate to assess for chronotropic competence.  I discussed with Dr. Fletcher Anon.  I will order short acting dilt 30 mg prn tachycardia for pt to use @ home.  Cont anticoagulation.  OK for d/c if stable with ambulation.  Murray Hodgkins, NP

## 2016-02-16 NOTE — Care Management Important Message (Signed)
Important Message  Patient Details  Name: CARSHENA SHAIKH MRN: [id number] Date of Birth: [birth year]   Medicare Important Message Given:  Yes    Jolly Mango, RN 02/16/2016, 9:23 AM

## 2016-02-16 NOTE — Progress Notes (Signed)
Heart rate decreased to 35 currently sinus brady patient resting in bed. No c/o pain will continue to monitor closely

## 2016-02-16 NOTE — Progress Notes (Signed)
Patient ambulated and heart rate reached 50. Patient had no complaints of nausea or dizziness. Ignacia Bayley notified.

## 2016-02-16 NOTE — Care Management (Signed)
Eliquis coupon given.  Met with patient at bedside. She is independent, active and drives. Lives at home alone. Uses no DME. Current with PCP. No needs anticipated.

## 2016-02-16 NOTE — Progress Notes (Addendum)
Patient's heart rate in the 30's, rhythm sinus brady, junctional at times. Ignacia Bayley notified and said to see if patient could tolerate ambulation. Will ambulate patient, continue to monitor and will follow up with cardiology.

## 2016-02-16 NOTE — Progress Notes (Signed)
Heart rate continues to fluctuate, A-fib on the monitor. No complaints of dizziness at rest or with exertion. Orthostatics are stable. Patient is resting without any complications. Will continue to monitor and assess. Will continue to educate patient about disease process.

## 2016-02-16 NOTE — Progress Notes (Signed)
Discharge instructions reviewed with patient and patient verbalized understanding. Hard copy prescription given to patient. IV and tele removed. No distress at this time. Son is at bedside and will be transporting patient home.

## 2016-02-17 ENCOUNTER — Encounter: Payer: Self-pay | Admitting: Family Medicine

## 2016-02-17 DIAGNOSIS — E786 Lipoprotein deficiency: Secondary | ICD-10-CM | POA: Insufficient documentation

## 2016-02-17 NOTE — Discharge Summary (Signed)
[place] [facility] [place] NAME: [name]    MR#:  [id number]  DATE OF BIRTH:  1941/01/09  DATE OF ADMISSION:  02/15/2016 ADMITTING PHYSICIAN: Idelle Crouch, MD  DATE OF DISCHARGE: 02/16/2016  3:40 PM  PRIMARY CARE PHYSICIAN: Ashok Norris, MD   ADMISSION DIAGNOSIS:  Dizziness [R42] New onset atrial fibrillation ([place]) [I48.91] Atrial fibrillation with rapid ventricular response ([employer]) [I48.91]  DISCHARGE DIAGNOSIS:  Principal Problem:   Atrial fibrillation with RVR (HCC) Active Problems:   Essential hypertension   Chest pain   Hypotension   Dizziness   Bradycardia   SECONDARY DIAGNOSIS:   Past Medical History:  Diagnosis Date  . Acute bronchitis   . Chronic kidney disease (CKD), stage III (moderate)   . Hypertension   . Insomnia, unspecified   . Osteoporosis, unspecified   . Pure hypercholesterolemia   . Unspecified asthma(493.90)   . Unspecified transient cerebral ischemia      ADMITTING HISTORY  HPI: [name] is a 75 y.o. female has a past medical history significant for HTN and CKD who presents to ER with dizziness. In ER, pt found to be in rapid a-fib. Also c/o chest pain and SOB. Some orthopnea. No fever. No cardiac hx. She is now admitted. She is hypotensive after Cardizem bolus in ER. She remains in A-fib  Review of Systems: The patient denies anorexia, fever, weight loss,, vision loss, decreased hearing, hoarseness,  syncope, peripheral edema, balance deficits, hemoptysis, abdominal pain, melena, hematochezia, severe indigestion/heartburn, hematuria, incontinence, genital sores, muscle weakness, suspicious skin lesions, transient blindness, difficulty walking, depression, unusual weight change, abnormal bleeding, enlarged lymph nodes, angioedema, and breast masses.   HOSPITAL COURSE:   * Atrial fibrillation with RVR Patient presented with heartrate in the 120s. She did have history of sick  sinus syndrome in the past. Started on oral Cardizem with which she converted to normal sinus rhythm but started having bradycardia in the 40s to 50s. Asymptomatic. Discussed with cardiology and Cardizem long-acting was stopped. Patient will be discharged home on Eliquis and short-acting Cardizem to be used as needed for tachycardia.  She will follow up with cardiology in 4 weeks for cardioversion.  Stable for discharge home.  CONSULTS OBTAINED:  Treatment Team:  Josue Hector, MD  DRUG ALLERGIES:   Allergies  Allergen Reactions  . Ace Inhibitors Swelling    Patient states her tongue swell.  [name] Penicillins Anxiety    Has patient had a PCN reaction causing immediate rash, facial/tongue/throat swelling, SOB or lightheadedness with hypotension: No Has patient had a PCN reaction causing severe rash involving mucus membranes or skin necrosis: No Has patient had a PCN reaction that required hospitalization No Has patient had a PCN reaction occurring within the last 10 years: No If all of the above answers are "NO", then may proceed with Cephalosporin use.    DISCHARGE MEDICATIONS:   Discharge Medication List as of 02/16/2016  3:04 PM    START taking these medications   Details  apixaban (ELIQUIS) 5 MG TABS tablet Take 1 tablet (5 mg total) by mouth 2 (two) times daily., Starting Mon 02/16/2016, Print    diltiazem (CARDIZEM) 30 MG tablet Take 1 tablet (30 mg total) by mouth every 6 (six) hours as needed (for tachycardia/recurrent Afib.)., Starting Mon 02/16/2016, Normal      CONTINUE these medications which have NOT CHANGED   Details  albuterol (PROVENTIL HFA;VENTOLIN HFA) 108 (90 Base) MCG/ACT inhaler Inhale 2 puffs  into the lungs every 4 (four) hours as needed for wheezing or shortness of breath., Starting Mon 01/26/2016, Normal    aspirin EC 81 MG tablet Take 81 mg by mouth at bedtime., Historical Med    hydrochlorothiazide (HYDRODIURIL) 25 MG tablet Take 1 tablet (25 mg total) by  mouth daily., Starting Tue 01/06/2016, Normal    meclizine (ANTIVERT) 25 MG tablet Take 1 tablet (25 mg total) by mouth every 8 (eight) hours as needed., Starting Mon 01/26/2016, Normal      STOP taking these medications     amLODipine (NORVASC) 10 MG tablet      diltiazem (CARDIZEM CD) 180 MG 24 hr capsule         Today   VITAL SIGNS:  Blood pressure (!) 109/53, pulse (!) 42, temperature 97.6 F (36.4 C), temperature source Oral, resp. rate 18, height 5\' 3"  (1.6 m), weight 83.4 kg (183 lb 14.4 oz), SpO2 94 %.  I/O:  No intake or output data in the 24 hours ending 02/17/16 1414  PHYSICAL EXAMINATION:  Physical Exam  GENERAL:  75 y.o.-year-old patient lying in the bed with no acute distress.  LUNGS: Normal breath sounds bilaterally, no wheezing, rales,rhonchi or crepitation. No use of accessory muscles of respiration.  CARDIOVASCULAR: S1, S2 normal. No murmurs, rubs, or gallops.  ABDOMEN: Soft, non-tender, non-distended. Bowel sounds present. No organomegaly or mass.  NEUROLOGIC: Moves all 4 extremities. PSYCHIATRIC: The patient is alert and oriented x 3.  SKIN: No obvious rash, lesion, or ulcer.   DATA REVIEW:   CBC  Recent Labs Lab 02/16/16 0525  WBC 8.3  HGB 13.7  HCT 39.4  PLT 288    Chemistries   Recent Labs Lab 02/16/16 0525  NA 140  K 3.7  CL 111  CO2 24  GLUCOSE 106*  BUN 14  CREATININE 0.87  CALCIUM 8.4*  AST 21  ALT 21  ALKPHOS 49  BILITOT 0.5    Cardiac Enzymes  Recent Labs Lab 02/16/16 0525  TROPONINI <0.03    Microbiology Results  No results found for this or any previous visit.  RADIOLOGY:  No results found.  Follow up with PCP in 1 week.  Management plans discussed with the patient, family and they are in agreement.  CODE STATUS:  Code Status History    Date Active Date Inactive Code Status Order ID Comments User Context   02/15/2016  3:35 PM 02/16/2016  8:05 AM Full Code [id number]  Idelle Crouch, MD Inpatient       TOTAL TIME TAKING CARE OF THIS PATIENT ON DAY OF DISCHARGE: more than 30 minutes.   Hillary Bow R M.D on 02/17/2016 at 2:14 PM  Between 7am to 6pm - Pager - [phone]  After 6pm go to [email or web address] - password [email or web address] [facility]  Office  [phone]  CC: Primary care physician; Ashok Norris, MD  Note: This dictation was prepared with Dragon dictation along with smaller phrase technology. Any transcriptional errors that result from this process are unintentional.

## 2016-02-23 ENCOUNTER — Ambulatory Visit (INDEPENDENT_AMBULATORY_CARE_PROVIDER_SITE_OTHER): Payer: Medicare HMO | Admitting: Cardiovascular Disease

## 2016-02-23 ENCOUNTER — Telehealth: Payer: Self-pay | Admitting: Cardiovascular Disease

## 2016-02-23 ENCOUNTER — Encounter: Payer: Self-pay | Admitting: Cardiovascular Disease

## 2016-02-23 VITALS — BP 142/82 | HR 75 | Ht 62.5 in | Wt 179.1 lb

## 2016-02-23 DIAGNOSIS — I10 Essential (primary) hypertension: Secondary | ICD-10-CM

## 2016-02-23 DIAGNOSIS — I480 Paroxysmal atrial fibrillation: Secondary | ICD-10-CM | POA: Diagnosis not present

## 2016-02-23 MED ORDER — APIXABAN 5 MG PO TABS: 5.0000 mg | ORAL_TABLET | Freq: Two times a day (BID) | ORAL | 5 refills | Status: DC

## 2016-02-23 MED ORDER — AMLODIPINE BESYLATE 5 MG PO TABS: 5.0000 mg | ORAL_TABLET | Freq: Every day | ORAL | 5 refills | Status: DC

## 2016-02-23 NOTE — Progress Notes (Signed)
Cardiology Office Note   Date:  02/23/2016   ID:  Camay, Finamore 1941-03-01, MRN [id number]  PCP:  Ashok Norris, MD  Cardiologist:   Kathlyn Sacramento, MD   No chief complaint on file.     History of Present Illness: [name] is a 75 y.o. female who presents for post hospital follow-up. She was seen by me in 2014 for asymptomatic bradycardia. She has prolonged history of vertigo. She presented recently to [facility] with dizziness, poor balance, diarrhea, nausea and vomiting. She was found to be in A. fib with RVR and was treated with diltiazem. She converted to sinus rhythm the following day. Due to baseline bradycardia, she was not discharged on any rate controlling medication especially that she converted to sinus rhythm. echocardiogram showed normal LV systolic function, mild mitral regurgitation, no evidence of pulmonary hypertension and normal atrial size. Atrial fibrillation was felt to be vagally mediated. Since hospital discharge, she has not had any recurrent chest pain, shortness of breath or palpitations. She reports that she gets these "vertigo attacks" 2-3 times per year.   Past Medical History:  Diagnosis Date  . Acute bronchitis   . Chronic kidney disease (CKD), stage III (moderate)   . Hypertension   . Insomnia, unspecified   . Osteoporosis, unspecified   . Paroxysmal atrial fibrillation ([place]) 01/2016  . Pure hypercholesterolemia   . Unspecified asthma(493.90)   . Unspecified transient cerebral ischemia     Past Surgical History:  Procedure Laterality Date  . ABDOMINAL HYSTERECTOMY    . COLONOSCOPY WITH PROPOFOL N/A 03/04/2015   Procedure: COLONOSCOPY WITH PROPOFOL;  Surgeon: Lucilla Lame, MD;  Location: [facility] ENDOSCOPY;  Service: Endoscopy;  Laterality: N/A;     Current Outpatient Prescriptions  Medication Sig Dispense Refill  . apixaban (ELIQUIS) 5 MG TABS tablet Take 1 tablet (5 mg total) by mouth 2 (two) times daily. 60 tablet 0  .  amLODipine (NORVASC) 5 MG tablet Take 1 tablet (5 mg total) by mouth daily. 30 tablet 5  . diltiazem (CARDIZEM) 30 MG tablet Take 1 tablet (30 mg total) by mouth every 6 (six) hours as needed (for tachycardia/recurrent Afib.). 30 tablet 3  . hydrochlorothiazide (HYDRODIURIL) 25 MG tablet Take 1 tablet (25 mg total) by mouth daily. (Patient taking differently: Take 25 mg by mouth at bedtime. ) 30 tablet 1  . meclizine (ANTIVERT) 25 MG tablet Take 1 tablet (25 mg total) by mouth every 8 (eight) hours as needed. 30 tablet 0   No current facility-administered medications for this visit.     Allergies:   Ace inhibitors and Penicillins    Social History:  The patient  reports that she has never smoked. She has never used smokeless tobacco. She reports that she does not drink alcohol or use drugs.   Family History:  The patient's family history includes COPD in her father; Cancer in her brother; Hypertension in her brother and mother.    ROS:  Please see the history of present illness.   Otherwise, review of systems are positive for none.   All other systems are reviewed and negative.    PHYSICAL EXAM: VS:  BP (!) 142/82   Pulse 75   Ht 5' 2.5" (1.588 m)   Wt 179 lb 1.9 oz (81.2 kg)   SpO2 98%   BMI 32.24 kg/m  , BMI Body mass index is 32.24 kg/m. GEN: Well nourished, well developed, in no acute distress  HEENT: normal  Neck: no  JVD, carotid bruits, or masses Cardiac: RRR; no murmurs, rubs, or gallops,no edema  Respiratory:  clear to auscultation bilaterally, normal work of breathing GI: soft, nontender, nondistended, + BS MS: no deformity or atrophy  Skin: warm and dry, no rash Neuro:  Strength and sensation are intact Psych: euthymic mood, full affect   EKG:  EKG is not ordered today.   Recent Labs: 02/15/2016: B Natriuretic Peptide 90.0; TSH 1.190 02/16/2016: ALT 21; BUN 14; Creatinine, Ser 0.87; Hemoglobin 13.7; Platelets 288; Potassium 3.7; Sodium 140    Lipid Panel      Component Value Date/Time   CHOL 159 01/26/2016 0929   CHOL 172 02/28/2015 1131   TRIG 108 01/26/2016 0929   HDL 34 (L) 01/26/2016 0929   CHOLHDL 4.7 01/26/2016 0929   VLDL 22 01/26/2016 0929   LDLCALC 103 01/26/2016 0929      Wt Readings from Last 3 Encounters:  02/23/16 179 lb 1.9 oz (81.2 kg)  02/16/16 183 lb 14.4 oz (83.4 kg)  01/26/16 184 lb (83.5 kg)        PAD Screen 02/23/2016  Previous PAD dx? No  Previous surgical procedure? No  Pain with walking? No  Feet/toe relief with dangling? No  Painful, non-healing ulcers? No  Extremities discolored? No      ASSESSMENT AND PLAN:  1.   Paroxysmal atrial fibrillation: It appears that her episode of atrial fibrillation was vagally mediated in the setting of severe vertigo and severe GI symptoms. Nonetheless, she is at high risk for recurrent atrial fibrillation. CHADS VASc score is 3. Thus I recommend continuing anticoagulation with Eliquis. Aspirin was discontinued today.   she has diltiazem short-acting to be used as needed for breakthrough episodes. If atrial fibrillation becomes more frequent, an antiarrhythmic medication can be considered especially with her baseline bradycardia.    2. Essential hypertension: Blood pressure is mildly elevated. Amlodipine was resumed. She is also on hydrochlorothiazide.  3. Vertigo: Possible benign positional vertigo. She reports 2-3 episodes per year which are usually very severe. I recommend ENT referral for definitive treatment of this.    Disposition:   FU with me in 6 months  Signed,  Kathlyn Sacramento, MD  02/23/2016 1:15 PM    [facility]

## 2016-02-23 NOTE — Telephone Encounter (Signed)
Pt calling stating she's heard that if your on a blood thinner there are some things you can and can't eat Would like call back on this

## 2016-02-23 NOTE — Patient Instructions (Signed)
Medication Instructions:  Your physician has recommended you make the following change in your medication:  STOP taking aspirin START taking amlodipine 5mg  once daily   Labwork: none  Testing/Procedures: none  Follow-Up: Your physician wants you to follow-up in: six months with Dr. Fletcher Anon.  You will receive a reminder letter in the mail two months in advance. If you don't receive a letter, please call our office to schedule the follow-up appointment.   Any Other Special Instructions Will Be Listed Below (If Applicable).     If you need a refill on your cardiac medications before your next appointment, please call your pharmacy.

## 2016-02-24 ENCOUNTER — Ambulatory Visit (INDEPENDENT_AMBULATORY_CARE_PROVIDER_SITE_OTHER): Payer: Medicare HMO | Admitting: Family Medicine

## 2016-02-24 ENCOUNTER — Encounter: Payer: Self-pay | Admitting: Family Medicine

## 2016-02-24 VITALS — BP 110/74 | HR 68 | Temp 98.6°F | Wt 179.0 lb

## 2016-02-24 DIAGNOSIS — Z23 Encounter for immunization: Secondary | ICD-10-CM | POA: Diagnosis not present

## 2016-02-24 DIAGNOSIS — R42 Dizziness and giddiness: Secondary | ICD-10-CM | POA: Diagnosis not present

## 2016-02-24 DIAGNOSIS — E8809 Other disorders of plasma-protein metabolism, not elsewhere classified: Secondary | ICD-10-CM | POA: Diagnosis not present

## 2016-02-24 DIAGNOSIS — I480 Paroxysmal atrial fibrillation: Secondary | ICD-10-CM | POA: Insufficient documentation

## 2016-02-24 NOTE — Progress Notes (Signed)
BP 110/74   Pulse 68   Temp 98.6 F (37 C) (Oral)   Wt 179 lb (81.2 kg)   SpO2 94%   BMI 32.22 kg/m    Subjective:    [name], female    DOB: [birth year], 75 y.o.   MRN: [id number]  HPI: [name] is a 75 y.o. female  Chief Complaint  Patient presents with  . Hospitalization Follow-up    vertigo   Patient is here to get referral for exercises for dizziness Patient was vomiting and hospitalized, and the vomiting caused the a-fib she says She had dizziness, nausea, poor balance; she is fine now She saw her cardiologist who suggested exercises/therapy for vertigo; Dr. Tyrell Antonio note mentions that the a-fib was thought to be vagally mediated She has been to Dr. Pryor Ochoa and then went to a specialist; she says he told her she had fluid being gathered back there, then sat up and turned head left to right and down but not back She needs some kind of therapy she says No problems since leaving the hospital Echo done at the hospital Troponins negative x 3  Relevant past medical, surgical, family and social history reviewed Past Medical History:  Diagnosis Date  . Chronic kidney disease (CKD), stage III (moderate)   . Hypertension   . Insomnia, unspecified   . Osteoporosis, unspecified   . Paroxysmal atrial fibrillation ([place]) 01/2016  . Pure hypercholesterolemia   . Unspecified asthma(493.90)   . Unspecified transient cerebral ischemia    Past Surgical History:  Procedure Laterality Date  . ABDOMINAL HYSTERECTOMY    . COLONOSCOPY WITH PROPOFOL N/A 03/04/2015   Procedure: COLONOSCOPY WITH PROPOFOL;  Surgeon: Lucilla Lame, MD;  Location: [facility] ENDOSCOPY;  Service: Endoscopy;  Laterality: N/A;   Family History  Problem Relation Age of Onset  . COPD Father   . Hypertension Mother   . Hypertension Brother   . Cancer Brother    Social History  Substance Use Topics  . Smoking status: Never Smoker  . Smokeless tobacco: Never Used  . Alcohol use No    Interim medical history since last visit reviewed. Allergies and medications reviewed  Review of Systems Per HPI unless specifically indicated above     Objective:    BP 110/74   Pulse 68   Temp 98.6 F (37 C) (Oral)   Wt 179 lb (81.2 kg)   SpO2 94%   BMI 32.22 kg/m   Wt Readings from Last 3 Encounters:  02/24/16 179 lb (81.2 kg)  02/23/16 179 lb 1.9 oz (81.2 kg)  02/16/16 183 lb 14.4 oz (83.4 kg)    Physical Exam  Constitutional: She appears well-developed and well-nourished.  HENT:  Mouth/Throat: Mucous membranes are normal.  Eyes: EOM are normal. No scleral icterus.  Cardiovascular: Normal rate and regular rhythm.   Pulmonary/Chest: Effort normal and breath sounds normal.  Neurological:  No nystagmus; no tics or tremor  Skin: She is not diaphoretic. No pallor.  Psychiatric: She has a normal mood and affect. Her behavior is normal.   Results for orders placed or performed during the hospital encounter of 02/15/16  Comprehensive metabolic panel  Result Value Ref Range   Sodium 140 135 - 145 mmol/L   Potassium 4.3 3.5 - 5.1 mmol/L   Chloride 106 101 - 111 mmol/L   CO2 28 22 - 32 mmol/L   Glucose, Bld 177 (H) 65 - 99 mg/dL   BUN 24 (H) 6 - 20 mg/dL  Creatinine, Ser 0.98 0.44 - 1.00 mg/dL   Calcium 9.7 8.9 - 10.3 mg/dL   Total Protein 8.1 6.5 - 8.1 g/dL   Albumin 4.1 3.5 - 5.0 g/dL   AST 26 15 - 41 U/L   ALT 27 14 - 54 U/L   Alkaline Phosphatase 64 38 - 126 U/L   Total Bilirubin 0.6 0.3 - 1.2 mg/dL   GFR calc non Af Amer 56 (L) >60 mL/min   GFR calc Af Amer >60 >60 mL/min   Anion gap 6 5 - 15  Brain natriuretic peptide  Result Value Ref Range   B Natriuretic Peptide 90.0 0.0 - 100.0 pg/mL  Troponin I  Result Value Ref Range   Troponin I <0.03 <0.03 ng/mL  CBC with Differential  Result Value Ref Range   WBC 10.0 3.6 - 11.0 K/uL   RBC 4.90 3.80 - 5.20 MIL/uL   Hemoglobin 15.4 12.0 - 16.0 g/dL   HCT 44.6 35.0 - 47.0 %   MCV 91.0 80.0 - 100.0 fL   MCH  31.5 26.0 - 34.0 pg   MCHC 34.6 32.0 - 36.0 g/dL   RDW 13.2 11.5 - 14.5 %   Platelets 313 150 - 440 K/uL   Neutrophils Relative % 85 %   Neutro Abs 8.5 (H) 1.4 - 6.5 K/uL   Lymphocytes Relative 12 %   Lymphs Abs 1.2 1.0 - 3.6 K/uL   Monocytes Relative 3 %   Monocytes Absolute 0.3 0.2 - 0.9 K/uL   Eosinophils Relative 0 %   Eosinophils Absolute 0.0 0 - 0.7 K/uL   Basophils Relative 0 %   Basophils Absolute 0.0 0 - 0.1 K/uL  Thyroid Panel With TSH  Result Value Ref Range   TSH 1.190 0.450 - 4.500 uIU/mL   T4, Total 5.7 4.5 - 12.0 ug/dL   T3 Uptake Ratio 22 (L) 24 - 39 %   Free Thyroxine Index 1.3 1.2 - 4.9  Troponin I  Result Value Ref Range   Troponin I <0.03 <0.03 ng/mL  Troponin I  Result Value Ref Range   Troponin I <0.03 <0.03 ng/mL  CBC  Result Value Ref Range   WBC 8.3 3.6 - 11.0 K/uL   RBC 4.35 3.80 - 5.20 MIL/uL   Hemoglobin 13.7 12.0 - 16.0 g/dL   HCT 39.4 35.0 - 47.0 %   MCV 90.7 80.0 - 100.0 fL   MCH 31.4 26.0 - 34.0 pg   MCHC 34.7 32.0 - 36.0 g/dL   RDW 13.7 11.5 - 14.5 %   Platelets 288 150 - 440 K/uL  Comprehensive metabolic panel  Result Value Ref Range   Sodium 140 135 - 145 mmol/L   Potassium 3.7 3.5 - 5.1 mmol/L   Chloride 111 101 - 111 mmol/L   CO2 24 22 - 32 mmol/L   Glucose, Bld 106 (H) 65 - 99 mg/dL   BUN 14 6 - 20 mg/dL   Creatinine, Ser 0.87 0.44 - 1.00 mg/dL   Calcium 8.4 (L) 8.9 - 10.3 mg/dL   Total Protein 6.3 (L) 6.5 - 8.1 g/dL   Albumin 3.1 (L) 3.5 - 5.0 g/dL   AST 21 15 - 41 U/L   ALT 21 14 - 54 U/L   Alkaline Phosphatase 49 38 - 126 U/L   Total Bilirubin 0.5 0.3 - 1.2 mg/dL   GFR calc non Af Amer >60 >60 mL/min   GFR calc Af Amer >60 >60 mL/min   Anion gap 5 5 - 15  Troponin I  Result Value Ref Range   Troponin I <0.03 <0.03 ng/mL  ECHOCARDIOGRAM COMPLETE  Result Value Ref Range   Weight 2,942.4 oz   Height 63 in   BP 102/66 mmHg      Assessment & Plan:   Problem List Items Addressed This Visit      Cardiovascular and  Mediastinum   Paroxysmal atrial fibrillation (HCC)     Other   Vertigo - Primary    Refer to ENT      Relevant Orders   Ambulatory referral to ENT   Hypoalbuminemia    Noted in hospital labs; encouraged adequate protein intake       Other Visit Diagnoses    Needs flu shot       Relevant Orders   Flu vaccine HIGH DOSE PF (Fluzone High dose)      Follow up plan: No Follow-up on file.  An after-visit summary was printed and given to the patient at [facility].  Please see the patient instructions which may contain other information and recommendations beyond what is mentioned above in the assessment and plan.  No orders of the defined types were placed in this encounter.   Orders Placed This Encounter  Procedures  . Flu vaccine HIGH DOSE PF (Fluzone High dose)  . Ambulatory referral to ENT

## 2016-02-24 NOTE — Assessment & Plan Note (Signed)
Noted in hospital labs; encouraged adequate protein intake

## 2016-02-24 NOTE — Telephone Encounter (Signed)
Reviewed eliquis information/education w/pt who verbalized understanding.

## 2016-02-24 NOTE — Assessment & Plan Note (Signed)
Refer to ENT

## 2016-02-24 NOTE — Patient Instructions (Signed)
Try to increase your protein intake We'll refer you to the [facility] doctor

## 2016-02-26 ENCOUNTER — Telehealth: Payer: Self-pay | Admitting: Cardiovascular Disease

## 2016-02-26 NOTE — Telephone Encounter (Signed)
Pt taking Eliquis 5mg  BID. Calls stating she can not afford the medication. Forward to MD to review and advise regarding an alternative.

## 2016-02-26 NOTE — Telephone Encounter (Signed)
Pt would like a different rx than Eliquis, states it is too expensive. Please call.

## 2016-02-27 ENCOUNTER — Other Ambulatory Visit: Payer: Self-pay

## 2016-02-27 NOTE — Telephone Encounter (Signed)
Left detailed message on pt's home VM.

## 2016-02-27 NOTE — Telephone Encounter (Signed)
Alternatives include Xarelto or Pradaxa. She should check with her insurance to see if they are cheaper.  Otherwise, Warfarin is cheap but requires checking INRs.

## 2016-03-02 ENCOUNTER — Telehealth: Payer: Self-pay | Admitting: Cardiovascular Disease

## 2016-03-02 NOTE — Telephone Encounter (Signed)
Pt states Pradaxa is less expensive than Xarelto, asks if it is ok she can take Pradaxa instead of Xarelto. Please advise.

## 2016-03-02 NOTE — Telephone Encounter (Signed)
Patient prefers Pradaxa d/t cost instead of Eliquis.  Forward to MD to advise.

## 2016-03-03 NOTE — Telephone Encounter (Signed)
Informed pt Dr. Fletcher Anon is agreeable to changing eliquis to pradaxa 150mg  BID. She verbalized understanding that she is not to take both medications. Pt will stop eliquis Sept 20 when her prescription runs out then begin pradaxa. Prescription sent to [facility] and samples left at front desk for pt pick up. She had no further questions at this time.

## 2016-03-03 NOTE — Telephone Encounter (Signed)
Switch to Pradaxa 150 mg bid.

## 2016-03-03 NOTE — Telephone Encounter (Signed)
Left message on machine for patient to contact the office.   

## 2016-03-09 ENCOUNTER — Telehealth: Payer: Self-pay | Admitting: Cardiovascular Disease

## 2016-03-09 ENCOUNTER — Other Ambulatory Visit: Payer: Self-pay

## 2016-03-09 MED ORDER — DABIGATRAN ETEXILATE MESYLATE 150 MG PO CAPS: 150.0000 mg | ORAL_CAPSULE | Freq: Two times a day (BID) | ORAL | 1 refills | Status: DC

## 2016-03-09 NOTE — Telephone Encounter (Signed)
Can you see telephone notes regarding the change from Eliquis to Pradaxa?  I do not see any documentation in med list of the change.  If you can correct the updated med list and I will send in the correct medication.

## 2016-03-09 NOTE — Telephone Encounter (Signed)
Left detailed message on pt home VM that Pradaxa prescription sent to [facility].  Prescription submitted.

## 2016-03-09 NOTE — Telephone Encounter (Signed)
°*  STAT* If patient is at the pharmacy, call can be transferred to refill team.   1. Which medications need to be refilled? (please list name of each medication and dose if known)  Pradaxa   2. Which pharmacy/location (including street and city if local pharmacy) is medication to be sent to? [facility] in [place]  3. Do they need a 30 day or 90 day supply?  90 day

## 2016-03-10 ENCOUNTER — Encounter: Payer: Medicare Other | Admitting: Physician Assistant

## 2016-03-29 ENCOUNTER — Other Ambulatory Visit: Payer: Self-pay | Admitting: Family Medicine

## 2016-04-02 ENCOUNTER — Telehealth: Payer: Self-pay | Admitting: Cardiovascular Disease

## 2016-04-02 ENCOUNTER — Other Ambulatory Visit: Payer: Self-pay

## 2016-04-02 MED ORDER — DABIGATRAN ETEXILATE MESYLATE 75 MG PO CAPS: 150.0000 mg | ORAL_CAPSULE | Freq: Two times a day (BID) | ORAL | 3 refills | Status: DC

## 2016-04-02 NOTE — Telephone Encounter (Addendum)
Pt reports paying over $200 for Pradaxa and would like to change to another more affordable medication. She is currently taking as directed with no missed doses. Printed GoodRx coupon and pt is agreeable to have it filled monthly at [facility] [employer]. Coupon placed in the mail. Pt is appreciative with no further questions at this time. New prescription submitted.

## 2016-04-02 NOTE — Telephone Encounter (Signed)
Pt states Pradaxa is too expensive, and requests a generic or something different. Please call and advise.

## 2016-04-06 ENCOUNTER — Other Ambulatory Visit: Payer: Self-pay

## 2016-04-06 ENCOUNTER — Telehealth: Payer: Self-pay | Admitting: Cardiovascular Disease

## 2016-04-06 MED ORDER — RIVAROXABAN 20 MG PO TABS: 20.0000 mg | ORAL_TABLET | Freq: Every day | ORAL | 3 refills | Status: DC

## 2016-04-06 NOTE — Telephone Encounter (Signed)
Pt can not afford Pradaxa or Eliquis and would like alternate medication. Pradaxa coupon still does not make med affordable. Pt has 21 day Pradaxa supply.  Per verbal from Dr. Fletcher Anon, change to xarelto 20mg  qd.  Xarelto prescription submitted to [facility]. Pt notified and aware and will call [facility] to determine out-of-pocket cost.

## 2016-04-07 NOTE — Telephone Encounter (Signed)
Per [employer], xarelto $47/month. Left detailed message on pt home VM requesting call back.

## 2016-04-08 NOTE — Telephone Encounter (Signed)
Pt states $47/month for xarelto is affordable. She has 14-day supply Pradaxa supply left and would like to finish these before starting xarelto. She understands that she is not to take both and will pick up Xarelto in two weeks. Free 30-day supply coupon mailed to home address.

## 2016-04-20 ENCOUNTER — Other Ambulatory Visit: Payer: Self-pay | Admitting: Family Medicine

## 2016-04-22 NOTE — Telephone Encounter (Signed)
Dr. Fletcher Anon is prescribing 5 mg daily; 10 mg is not appropriate; DENIED

## 2016-07-13 ENCOUNTER — Encounter: Payer: Self-pay | Admitting: Physician Assistant

## 2016-07-13 ENCOUNTER — Other Ambulatory Visit
Admission: RE | Admit: 2016-07-13 | Discharge: 2016-07-13 | Disposition: A | Discharge status: Home / Self Care | Payer: Medicare Other | Source: Ambulatory Visit | Attending: Physician Assistant | Admitting: Physician Assistant

## 2016-07-13 ENCOUNTER — Ambulatory Visit (INDEPENDENT_AMBULATORY_CARE_PROVIDER_SITE_OTHER): Payer: Medicare Other | Admitting: Physician Assistant

## 2016-07-13 ENCOUNTER — Other Ambulatory Visit: Payer: Self-pay

## 2016-07-13 VITALS — BP 118/82 | HR 82 | Ht 63.0 in | Wt 180.2 lb

## 2016-07-13 DIAGNOSIS — K625 Hemorrhage of anus and rectum: Secondary | ICD-10-CM

## 2016-07-13 DIAGNOSIS — I10 Essential (primary) hypertension: Secondary | ICD-10-CM | POA: Diagnosis not present

## 2016-07-13 DIAGNOSIS — Z79899 Other long term (current) drug therapy: Secondary | ICD-10-CM

## 2016-07-13 DIAGNOSIS — I480 Paroxysmal atrial fibrillation: Secondary | ICD-10-CM | POA: Insufficient documentation

## 2016-07-13 DIAGNOSIS — R42 Dizziness and giddiness: Secondary | ICD-10-CM

## 2016-07-13 DIAGNOSIS — E876 Hypokalemia: Secondary | ICD-10-CM

## 2016-07-13 LAB — CBC WITH DIFFERENTIAL/PLATELET
Basophils Absolute: 0.1 10*3/uL (ref 0–0.1)
Basophils Relative: 1 %
Eosinophils Absolute: 0.1 10*3/uL (ref 0–0.7)
Eosinophils Relative: 1 %
HCT: 45.9 % (ref 35.0–47.0)
Hemoglobin: 15.6 g/dL (ref 12.0–16.0)
Lymphocytes Relative: 22 %
Lymphs Abs: 2.1 10*3/uL (ref 1.0–3.6)
MCH: 30.5 pg (ref 26.0–34.0)
MCHC: 34 g/dL (ref 32.0–36.0)
MCV: 89.7 fL (ref 80.0–100.0)
Monocytes Absolute: 0.7 10*3/uL (ref 0.2–0.9)
Monocytes Relative: 8 %
Neutro Abs: 6.6 10*3/uL — ABNORMAL HIGH (ref 1.4–6.5)
Neutrophils Relative %: 68 %
Platelets: 387 10*3/uL (ref 150–440)
RBC: 5.11 MIL/uL (ref 3.80–5.20)
RDW: 13.4 % (ref 11.5–14.5)
WBC: 9.6 10*3/uL (ref 3.6–11.0)

## 2016-07-13 LAB — BASIC METABOLIC PANEL WITH GFR
Anion gap: 8 (ref 5–15)
BUN: 20 mg/dL (ref 6–20)
CO2: 27 mmol/L (ref 22–32)
Calcium: 9.8 mg/dL (ref 8.9–10.3)
Chloride: 102 mmol/L (ref 101–111)
Creatinine, Ser: 1.03 mg/dL — ABNORMAL HIGH (ref 0.44–1.00)
GFR calc Af Amer: >60 mL/min
GFR calc non Af Amer: 53 mL/min — ABNORMAL LOW
Glucose, Bld: 102 mg/dL — ABNORMAL HIGH (ref 65–99)
Potassium: 3.2 mmol/L — ABNORMAL LOW (ref 3.5–5.1)
Sodium: 137 mmol/L (ref 135–145)

## 2016-07-13 MED ORDER — POTASSIUM CHLORIDE ER 10 MEQ PO TBCR: 10.0000 meq | EXTENDED_RELEASE_TABLET | Freq: Every day | ORAL | 3 refills | Status: DC

## 2016-07-13 NOTE — Patient Instructions (Signed)
Medication Instructions:  Please continue your current medications  Labwork: Please proceed to the [facility] for labs: BMET CBC  Testing/Procedures: None  Follow-Up: Please follow up w/ your PCP regarding blood in your stool  Your physician wants you to follow-up in: 3 months w/ Dr. Zannie Cove will receive a reminder letter in the mail two months in advance.  If you don't receive a letter, please call our office to schedule the follow-up appointment.  If you need a refill on your cardiac medications before your next appointment, please call your pharmacy.

## 2016-07-13 NOTE — Progress Notes (Signed)
Cardiology Office Note Date:  07/13/2016  Patient ID:  [name] 06/16/1941, MRN [id number] PCP:  Ashok Norris, MD  Cardiologist:  Dr. Fletcher Anon, MD    Chief Complaint: Follow up Afib  History of Present Illness: [name] is a 76 y.o. female with history of recently diagnosed Afib in 01/2016, asymptomatic bradycardia, prolonged history of vertigo with episodes occuring 2-3 times yearly, HLD, and asthma who presents for routine follow up of her Afib.   She was recently admitted in 01/2016 to [facility] for dizziness, poor balance, diarrhea, nausea, and vomiting. She was noted to be in new onset Afib with RVR. Treated with diltiazem and converted to sinus rhythm. Due to bradycardia, she was not dischaged on rate controlling medications, especially since she converted to sinus rhythm. Echo that admission showed normal LV systolic function, mild mitral regurgitation, without evidence of pulmonary hypertension, and normal atrial size. Labs showed a K+ of 3.7, SCr 0.87, albumin 3.1, unremarkable cbc, normal tsh, and negative troponin x 3. Her Afib was felt to be vagally mediated. She was last seen in the office by Dr. Fletcher Anon on 02/23/16, maintaining sinus rhythm, without chest pain, SOB, or palpitations. She was continued on full-dose anticoagulation with Eliquis in place of ASA given a CHADS2VASc of 3 (HTN, age x 1, sex category). Unfortunately, she could not afford Eliquis and was changed to Pradaxa in September, then subsequently changed to [place] in October given cheaper medication. She was continued on prn short-acting diltiazem given history of bradycardia.   She comes in doing well today. Has not had any further episodes of palpitations. No further dizziness, nausea, vomiting, or diarrhea. Tolerating Xarelto. She did note one episode of bright red blood mixed in with her stool about 2 weeks prior. She has not noticed any further blood from her rectum. Denies melena. Up to date with  colonoscopy within the past 5 years. One prior polyp removed - details unknown. Denies decrease in stool caliber. No constipation. She is uncertain if she has hemorrhoids. No hematemesis or blood in her urine. Has not needed to take any prn diltiazem. No further episodes of vertigo. Weight stable. No SOB or chest pain.    Past Medical History:  Diagnosis Date  . Hypertension   . Insomnia, unspecified   . Mitral regurgitation    a. echo 01/2016: nl LV sys fxn, mild MR, w/o pulm htn, nl atrial size  . Osteoporosis, unspecified   . Paroxysmal atrial fibrillation ([place]) 01/2016   a. diagnosed 01/2016; b. has been on eliquis, pradaxa, and xarelto at varying times 2/2 cost of each medication; c. CHADS2VASc --> 3 (HTN, age x 66, female)  . Pure hypercholesterolemia   . Unspecified asthma(493.90)   . Unspecified transient cerebral ischemia   . Vertigo     Past Surgical History:  Procedure Laterality Date  . ABDOMINAL HYSTERECTOMY    . COLONOSCOPY WITH PROPOFOL N/A 03/04/2015   Procedure: COLONOSCOPY WITH PROPOFOL;  Surgeon: Lucilla Lame, MD;  Location: [facility] ENDOSCOPY;  Service: Endoscopy;  Laterality: N/A;    Current Outpatient Prescriptions  Medication Sig Dispense Refill  . amLODipine (NORVASC) 5 MG tablet Take 1 tablet (5 mg total) by mouth daily. 30 tablet 5  . diltiazem (CARDIZEM) 30 MG tablet Take 1 tablet (30 mg total) by mouth every 6 (six) hours as needed (for tachycardia/recurrent Afib.). 30 tablet 3  . hydrochlorothiazide (HYDRODIURIL) 25 MG tablet TAKE 1 TABLET(25 MG) BY MOUTH DAILY 30 tablet 3  . meclizine (  ANTIVERT) 25 MG tablet Take 1 tablet (25 mg total) by mouth every 8 (eight) hours as needed. 30 tablet 0  . rivaroxaban (XARELTO) 20 MG TABS tablet Take 1 tablet (20 mg total) by mouth daily with supper. 30 tablet 3   No current facility-administered medications for this visit.     Allergies:   Ace inhibitors and Penicillins   Social History:  The patient  reports that she has  never smoked. She has never used smokeless tobacco. She reports that she does not drink alcohol or use drugs.   Family History:  The patient's family history includes COPD in her father; Cancer in her brother; Hypertension in her brother and mother.  ROS:   Review of Systems  Constitutional: Negative for chills, diaphoresis, fever, malaise/fatigue and weight loss.  HENT: Negative for congestion.   Eyes: Negative for discharge and redness.  Respiratory: Negative for cough, hemoptysis, sputum production, shortness of breath and wheezing.   Cardiovascular: Negative for chest pain, palpitations, orthopnea, claudication, leg swelling and PND.  Gastrointestinal: Positive for blood in stool. Negative for abdominal pain, constipation, diarrhea, heartburn, melena, nausea and vomiting.  Genitourinary: Negative for hematuria.  Musculoskeletal: Negative for falls and myalgias.  Skin: Negative for rash.  Neurological: Negative for dizziness, tingling, tremors, sensory change, speech change, focal weakness, loss of consciousness and weakness.  Endo/Heme/Allergies: Does not bruise/bleed easily.  Psychiatric/Behavioral: Negative for substance abuse. The patient is not nervous/anxious.   All other systems reviewed and are negative.    PHYSICAL EXAM:  VS:  BP 118/82 (BP Location: Left Arm, Patient Position: Sitting, Cuff Size: Normal)   Pulse 82   Ht 5\' 3"  (1.6 m)   Wt 180 lb 4 oz (81.8 kg)   BMI 31.93 kg/m  BMI: Body mass index is 31.93 kg/m.  Physical Exam  Constitutional: She is oriented to person, place, and time. She appears well-developed and well-nourished.  HENT:  Head: Normocephalic and atraumatic.  Eyes: Right eye exhibits no discharge. Left eye exhibits no discharge.  Neck: Normal range of motion. No JVD present.  Cardiovascular: Normal rate, regular rhythm, S1 normal, S2 normal and normal heart sounds.  Exam reveals no distant heart sounds, no friction rub, no midsystolic click and no  opening snap.   No murmur heard. Pulmonary/Chest: Effort normal and breath sounds normal. No respiratory distress. She has no decreased breath sounds. She has no wheezes. She has no rales. She exhibits no tenderness.  Abdominal: Soft. She exhibits no distension. There is no tenderness.  Musculoskeletal: She exhibits no edema.  Neurological: She is alert and oriented to person, place, and time.  Skin: Skin is warm and dry. No cyanosis. Nails show no clubbing.  Psychiatric: She has a normal mood and affect. Her speech is normal and behavior is normal. Judgment and thought content normal.     EKG:  Was ordered and interpreted by me today. Shows NSR, 82 bpm, QTc 479 msec, poor R wave progression, no acute st/t changes   Recent Labs: 02/15/2016: B Natriuretic Peptide 90.0; TSH 1.190 02/16/2016: ALT 21; BUN 14; Creatinine, Ser 0.87; Hemoglobin 13.7; Platelets 288; Potassium 3.7; Sodium 140  01/26/2016: Cholesterol 159; HDL 34; LDL Cholesterol 103; Total CHOL/HDL Ratio 4.7; Triglycerides 108; VLDL 22   CrCl cannot be calculated (Patient's most recent lab result is older than the maximum 21 days allowed.).   Wt Readings from Last 3 Encounters:  07/13/16 180 lb 4 oz (81.8 kg)  02/24/16 179 lb (81.2 kg)  02/23/16 179  lb 1.9 oz (81.2 kg)     Other studies reviewed: Additional studies/records reviewed today include: summarized above  ASSESSMENT AND PLAN:  1. PAF: Currently in sinus rhythm with heart rate in the 80's bpm. Has not needed any prn diltiazem since she was last seen in the clinic in August. Continue prn diltiazem. CHADS2VASc at least 3 (HTN, age x 1, sex category). Continue Xarelto 20 mg q dinner. Check bmet to assess CrCl.    2. Medication management: Has been on multiple DOACs since starting full-dose anticoagulation in 01/2016 per HPI. Discussed not duplicating therapy. As above.   3. BRBPR: Single episode of BRB mixed with stool 2 weeks ago. Colonoscopy as above per patient report.  Non since. Check cbc. Advised patient to follow up with pcp.   4. HTN: Well controlled. Continue current medications.   5. Vertigo: Stable, no recent episodes. PCP referred to ENT 01/2016, she cancelled that appointment.   Disposition: F/u with Dr. Fletcher Anon, MD in 3 months.   Current medicines are reviewed at length with the patient today. The patient did not have any concerns regarding medicines.  Melvern Banker PA-C 07/13/2016 2:22 PM     [address] [place], [place] [address] [phone]

## 2016-07-20 ENCOUNTER — Other Ambulatory Visit: Payer: Self-pay | Admitting: Family Medicine

## 2016-07-28 ENCOUNTER — Ambulatory Visit: Payer: Medicare HMO | Admitting: Family Medicine

## 2016-07-30 ENCOUNTER — Other Ambulatory Visit: Payer: Medicare Other

## 2016-08-02 ENCOUNTER — Other Ambulatory Visit (INDEPENDENT_AMBULATORY_CARE_PROVIDER_SITE_OTHER): Payer: Medicare Other

## 2016-08-02 DIAGNOSIS — E876 Hypokalemia: Secondary | ICD-10-CM | POA: Diagnosis not present

## 2016-08-03 ENCOUNTER — Other Ambulatory Visit: Payer: Self-pay

## 2016-08-03 ENCOUNTER — Telehealth: Payer: Self-pay | Admitting: Cardiovascular Disease

## 2016-08-03 ENCOUNTER — Other Ambulatory Visit: Payer: Medicare Other

## 2016-08-03 ENCOUNTER — Telehealth: Payer: Self-pay | Admitting: Physician Assistant

## 2016-08-03 DIAGNOSIS — I10 Essential (primary) hypertension: Secondary | ICD-10-CM

## 2016-08-03 LAB — BASIC METABOLIC PANEL WITH GFR
BUN/Creatinine Ratio: 19 (ref 12–28)
BUN: 24 mg/dL (ref 8–27)
CO2: 25 mmol/L (ref 18–29)
Calcium: 10 mg/dL (ref 8.7–10.3)
Chloride: 100 mmol/L (ref 96–106)
Creatinine, Ser: 1.24 mg/dL — ABNORMAL HIGH (ref 0.57–1.00)
GFR calc Af Amer: 50 mL/min/{1.73_m2} — ABNORMAL LOW
GFR calc non Af Amer: 43 mL/min/{1.73_m2} — ABNORMAL LOW
Glucose: 136 mg/dL — ABNORMAL HIGH (ref 65–99)
Potassium: 3.7 mmol/L (ref 3.5–5.2)
Sodium: 144 mmol/L (ref 134–144)

## 2016-08-03 NOTE — Telephone Encounter (Signed)
See phone note

## 2016-08-03 NOTE — Telephone Encounter (Signed)
Reviewed results and recommendations w/pt who verbalized understanding.  She will have labs next week at [facility] outpatient lab. Order placed.  "Please call the patient. Renal function trending upwards. Hold HCTZ for the next 3 days. If needed for BP can take her prn diltiazem. Recheck bmet 1 week. CrCl 52 mL/min, ok to continue current dose of Xarelto."

## 2016-08-03 NOTE — Telephone Encounter (Signed)
Pt is returning your call regading her lab results

## 2016-08-11 ENCOUNTER — Other Ambulatory Visit
Admission: RE | Admit: 2016-08-11 | Discharge: 2016-08-11 | Disposition: A | Discharge status: Home / Self Care | Payer: Medicare Other | Source: Ambulatory Visit | Attending: Physician Assistant | Admitting: Physician Assistant

## 2016-08-11 DIAGNOSIS — I10 Essential (primary) hypertension: Secondary | ICD-10-CM | POA: Insufficient documentation

## 2016-08-11 LAB — BASIC METABOLIC PANEL WITH GFR
Anion gap: 8 (ref 5–15)
BUN: 23 mg/dL — ABNORMAL HIGH (ref 6–20)
CO2: 25 mmol/L (ref 22–32)
Calcium: 9.4 mg/dL (ref 8.9–10.3)
Chloride: 103 mmol/L (ref 101–111)
Creatinine, Ser: 1.1 mg/dL — ABNORMAL HIGH (ref 0.44–1.00)
GFR calc Af Amer: 56 mL/min — ABNORMAL LOW
GFR calc non Af Amer: 49 mL/min — ABNORMAL LOW
Glucose, Bld: 103 mg/dL — ABNORMAL HIGH (ref 65–99)
Potassium: 3.7 mmol/L (ref 3.5–5.1)
Sodium: 136 mmol/L (ref 135–145)

## 2016-08-17 ENCOUNTER — Encounter: Payer: Self-pay | Admitting: Family Medicine

## 2016-08-17 ENCOUNTER — Other Ambulatory Visit: Payer: Self-pay | Admitting: Cardiovascular Disease

## 2016-08-17 ENCOUNTER — Ambulatory Visit (INDEPENDENT_AMBULATORY_CARE_PROVIDER_SITE_OTHER): Payer: Medicare Other | Admitting: Family Medicine

## 2016-08-17 VITALS — BP 112/84 | HR 75 | Temp 97.6°F | Resp 16 | Ht 63.0 in | Wt 178.9 lb

## 2016-08-17 DIAGNOSIS — R739 Hyperglycemia, unspecified: Secondary | ICD-10-CM

## 2016-08-17 DIAGNOSIS — E786 Lipoprotein deficiency: Secondary | ICD-10-CM | POA: Diagnosis not present

## 2016-08-17 DIAGNOSIS — K921 Melena: Secondary | ICD-10-CM

## 2016-08-17 DIAGNOSIS — J4520 Mild intermittent asthma, uncomplicated: Secondary | ICD-10-CM

## 2016-08-17 DIAGNOSIS — I480 Paroxysmal atrial fibrillation: Secondary | ICD-10-CM

## 2016-08-17 DIAGNOSIS — R42 Dizziness and giddiness: Secondary | ICD-10-CM

## 2016-08-17 DIAGNOSIS — I10 Essential (primary) hypertension: Secondary | ICD-10-CM

## 2016-08-17 MED ORDER — MONTELUKAST SODIUM 10 MG PO TABS: 10.0000 mg | ORAL_TABLET | Freq: Every day | ORAL | 3 refills | Status: DC

## 2016-08-17 MED ORDER — FLUTICASONE PROPIONATE 50 MCG/ACT NA SUSP: 2.0000 | Freq: Every day | NASAL | 6 refills | Status: DC

## 2016-08-17 MED ORDER — HYDROCHLOROTHIAZIDE 12.5 MG PO TABS: 12.5000 mg | ORAL_TABLET | Freq: Every day | ORAL | 5 refills | Status: DC

## 2016-08-17 NOTE — Addendum Note (Signed)
Addended by: Inda Coke on: 08/17/2016 12:38 PM   Modules accepted: Orders

## 2016-08-17 NOTE — Progress Notes (Signed)
Name: [name]   MRN: [id number]    DOB: [birth year]   Date:08/17/2016       Progress Note  Subjective  Chief Complaint  Chief Complaint  Patient presents with  . Medication Refill    6 month F/U  . Hypertension    Just seen Dr. Fletcher Anon recently, and they checked her kidney functions which came back good.  . Asthma    Well controlled takes inhaler very sparingly     HPI  HTN: she was on HCTZ 25 mg but potassium was low, bp is towards of low end of normal, she states she has mild orthostatic changes when she gets up fast. No chest pain or palpitation  Vertigo: she has vertigo with head movement, motion sickness, and when she has an episode she needs to go to [facility] by EMS because of severe nausea, vomiting and diarrhea. She has seen ENT but no recent visits, advised her to go back.   Paroxysmal Afib: sees Dr. Fletcher Anon, symptoms started August 2018, she is now on Xarelto and prn Cardizem, cardiac enzymes negative. No chest pain or palpitation   Asthma: she states she was given Symbicort in the past, she has intermittent wheezing - a couple of times a month, but she has a cough, sometimes wet about twice a week. Currently not on medication, no SOB with activity  Dyslipidemia: low HDL, discussed ways to improve levels.   Hyperglycemia/obesity: she is obese and has hyperglycemia, once at [facility] glucose of 136 - not sure if fasting, she denies polyphagia, polyuria or polydipsia.   Patient Active Problem List   Diagnosis Date Noted  . Paroxysmal atrial fibrillation ([place]) 02/24/2016  . Vertigo 02/24/2016  . Low HDL (under 40) 02/17/2016  . Dizziness 02/15/2016  . Hx of colonic polyps   . Benign neoplasm of sigmoid colon   . Asthma, mild intermittent 02/21/2015  . Hyperglycemia 02/21/2015  . Essential hypertension 02/20/2015  . Inconclusive mammogram due to dense breasts 01/02/2015  . Dense breast 01/02/2015  . Abnormal EKG 02/23/2013    Past Surgical History:  Procedure Laterality  Date  . ABDOMINAL HYSTERECTOMY    . COLONOSCOPY WITH PROPOFOL N/A 03/04/2015   Procedure: COLONOSCOPY WITH PROPOFOL;  Surgeon: Lucilla Lame, MD;  Location: [facility] ENDOSCOPY;  Service: Endoscopy;  Laterality: N/A;    Family History  Problem Relation Age of Onset  . COPD Father   . Hypertension Mother   . Hypertension Brother   . Cancer Brother     Social History   Social History  . Marital status: Single    Spouse name: N/A  . Number of children: N/A  . Years of education: N/A   Occupational History  . Not on file.   Social History Main Topics  . Smoking status: Never Smoker  . Smokeless tobacco: Never Used  . Alcohol use No  . Drug use: No  . Sexual activity: Not Currently   Other Topics Concern  . Not on file   Social History Narrative  . No narrative on file     Current Outpatient Prescriptions:  .  diltiazem (CARDIZEM) 30 MG tablet, Take 1 tablet (30 mg total) by mouth every 6 (six) hours as needed (for tachycardia/recurrent Afib.)., Disp: 30 tablet, Rfl: 3 .  hydrochlorothiazide (HYDRODIURIL) 12.5 MG tablet, Take 1 tablet (12.5 mg total) by mouth daily., Disp: 30 tablet, Rfl: 5 .  meclizine (ANTIVERT) 25 MG tablet, Take 1 tablet (25 mg total) by mouth every 8 (eight) hours  as needed., Disp: 30 tablet, Rfl: 0 .  potassium chloride (K-DUR) 10 MEQ tablet, Take 1 tablet (10 mEq total) by mouth daily., Disp: 30 tablet, Rfl: 3 .  XARELTO 20 MG TABS tablet, TAKE 1 TABLET BY MOUTH DAILY WITH SUPPER., Disp: 30 tablet, Rfl: 1 .  amLODipine (NORVASC) 5 MG tablet, Take 1 tablet (5 mg total) by mouth daily., Disp: 30 tablet, Rfl: 5  Allergies  Allergen Reactions  . Ace Inhibitors Swelling    Patient states her tongue swell.  [name] Penicillins Anxiety    Has patient had a PCN reaction causing immediate rash, facial/tongue/throat swelling, SOB or lightheadedness with hypotension: No Has patient had a PCN reaction causing severe rash involving mucus membranes or skin necrosis:  No Has patient had a PCN reaction that required hospitalization No Has patient had a PCN reaction occurring within the last 10 years: No If all of the above answers are "NO", then may proceed with Cephalosporin use.     ROS  Constitutional: Negative for fever or weight change.  Respiratory: Positive for cough but shortness of breath.   Cardiovascular: Negative for chest pain or palpitations.  Gastrointestinal: Negative for abdominal pain, no bowel changes.  Musculoskeletal: Negative for gait problem or joint swelling.  Skin: Negative for rash.  Neurological: Positive  For intermittent  dizziness and occasional dull  headache.  No other specific complaints in a complete review of systems (except as listed in HPI above).  Objective  Vitals:   08/17/16 1121  BP: 112/84  Pulse: 75  Resp: 16  Temp: 97.6 F (36.4 C)  TempSrc: Oral  SpO2: 97%  Weight: 178 lb 14.4 oz (81.1 kg)  Height: '5\' 3"'$  (1.6 m)    Body mass index is 31.69 kg/m.  Physical Exam  Constitutional: Patient appears well-developed and well-nourished. Obese No distress.  HEENT: head atraumatic, normocephalic, pupils equal and reactive to light,  neck supple, throat within normal limits Cardiovascular: Normal rate, regular rhythm and normal heart sounds.  No murmur heard. No BLE edema. Pulmonary/Chest: Effort normal and breath sounds normal. No respiratory distress. Abdominal: Soft.  There is no tenderness. Psychiatric: Patient has a normal mood and affect. behavior is normal. Judgment and thought content normal.   Recent Results (from the past 2160 hour(s))  Basic Metabolic Panel (BMET)     Status: Abnormal   Collection Time: 07/13/16  2:28 PM  Result Value Ref Range   Sodium 137 135 - 145 mmol/L   Potassium 3.2 (L) 3.5 - 5.1 mmol/L   Chloride 102 101 - 111 mmol/L   CO2 27 22 - 32 mmol/L   Glucose, Bld 102 (H) 65 - 99 mg/dL   BUN 20 6 - 20 mg/dL   Creatinine, Ser 1.03 (H) 0.44 - 1.00 mg/dL   Calcium 9.8  8.9 - 10.3 mg/dL   GFR calc non Af Amer 53 (L) >60 mL/min   GFR calc Af Amer >60 >60 mL/min    Comment: (NOTE) The eGFR has been calculated using the CKD EPI equation. This calculation has not been validated in all clinical situations. eGFR's persistently <60 mL/min signify possible Chronic Kidney Disease.    Anion gap 8 5 - 15  CBC with Differential/Platelet     Status: Abnormal   Collection Time: 07/13/16  2:28 PM  Result Value Ref Range   WBC 9.6 3.6 - 11.0 K/uL   RBC 5.11 3.80 - 5.20 MIL/uL   Hemoglobin 15.6 12.0 - 16.0 g/dL   HCT 45.9 35.0 -  47.0 %   MCV 89.7 80.0 - 100.0 fL   MCH 30.5 26.0 - 34.0 pg   MCHC 34.0 32.0 - 36.0 g/dL   RDW 13.4 11.5 - 14.5 %   Platelets 387 150 - 440 K/uL   Neutrophils Relative % 68 %   Neutro Abs 6.6 (H) 1.4 - 6.5 K/uL   Lymphocytes Relative 22 %   Lymphs Abs 2.1 1.0 - 3.6 K/uL   Monocytes Relative 8 %   Monocytes Absolute 0.7 0.2 - 0.9 K/uL   Eosinophils Relative 1 %   Eosinophils Absolute 0.1 0 - 0.7 K/uL   Basophils Relative 1 %   Basophils Absolute 0.1 0 - 0.1 K/uL  Basic Metabolic Panel (BMET)     Status: Abnormal   Collection Time: 08/02/16  8:30 AM  Result Value Ref Range   Glucose 136 (H) 65 - 99 mg/dL   BUN 24 8 - 27 mg/dL   Creatinine, Ser 1.24 (H) 0.57 - 1.00 mg/dL   GFR calc non Af Amer 43 (L) >59 mL/min/1.73   GFR calc Af Amer 50 (L) >59 mL/min/1.73   BUN/Creatinine Ratio 19 12 - 28   Sodium 144 134 - 144 mmol/L   Potassium 3.7 3.5 - 5.2 mmol/L   Chloride 100 96 - 106 mmol/L   CO2 25 18 - 29 mmol/L   Calcium 10.0 8.7 - 10.3 mg/dL  Basic metabolic panel     Status: Abnormal   Collection Time: 08/11/16  4:17 PM  Result Value Ref Range   Sodium 136 135 - 145 mmol/L   Potassium 3.7 3.5 - 5.1 mmol/L   Chloride 103 101 - 111 mmol/L   CO2 25 22 - 32 mmol/L   Glucose, Bld 103 (H) 65 - 99 mg/dL   BUN 23 (H) 6 - 20 mg/dL   Creatinine, Ser 1.10 (H) 0.44 - 1.00 mg/dL   Calcium 9.4 8.9 - 10.3 mg/dL   GFR calc non Af Amer 49  (L) >60 mL/min   GFR calc Af Amer 56 (L) >60 mL/min    Comment: (NOTE) The eGFR has been calculated using the CKD EPI equation. This calculation has not been validated in all clinical situations. eGFR's persistently <60 mL/min signify possible Chronic Kidney Disease.    Anion gap 8 5 - 15      PHQ2/9: Depression screen [facility] 2/9 08/17/2016 01/26/2016 06/24/2015 02/20/2015  Decreased Interest 0 0 0 0  Down, Depressed, Hopeless 0 0 0 0  PHQ - 2 Score 0 0 0 0     Fall Risk: Fall Risk  08/17/2016 01/26/2016 06/24/2015 02/20/2015 01/02/2015  Falls in the past year? No Yes No No No      Functional Status Survey: Is the patient deaf or have difficulty hearing?: No Does the patient have difficulty seeing, even when wearing glasses/contacts?: No Does the patient have difficulty concentrating, remembering, or making decisions?: No Does the patient have difficulty walking or climbing stairs?: No Does the patient have difficulty dressing or bathing?: No Does the patient have difficulty doing errands alone such as visiting a doctor's office or shopping?: No    Assessment & Plan  1. Paroxysmal atrial fibrillation (HCC)  On Xarelto and Cardizem prn  2. Low HDL (under 40)  Recheck yearly   3. Essential hypertension  We will decrease dose, recent episode of hypokalemia and bp is towards low end of normal  - hydrochlorothiazide (HYDRODIURIL) 12.5 MG tablet; Take 1 tablet (12.5 mg total) by mouth daily.  Dispense: 30 tablet; Refill: 5  4. Mild intermittent asthma without complication  -spirometry   5. Hyperglycemia  - Insulin, fasting - Hemoglobin A1c  6. Vertigo  Discussed importance of regular follow up with ENT  7. Hematochezia  - POC Hemoccult Bld/Stl (3-Cd Home Screen); Future She saw it on stools since started on Xarelto, we will recheck it and if positive refer to gi

## 2016-08-17 NOTE — Addendum Note (Signed)
Addended by: Inda Coke on: 08/17/2016 12:26 PM   Modules accepted: Orders

## 2016-08-18 DIAGNOSIS — H25013 Cortical age-related cataract, bilateral: Secondary | ICD-10-CM | POA: Diagnosis not present

## 2016-08-18 DIAGNOSIS — H18413 Arcus senilis, bilateral: Secondary | ICD-10-CM | POA: Diagnosis not present

## 2016-08-18 DIAGNOSIS — I10 Essential (primary) hypertension: Secondary | ICD-10-CM | POA: Diagnosis not present

## 2016-08-18 LAB — HEMOGLOBIN A1C
Hgb A1c MFr Bld: 6.4 % — ABNORMAL HIGH
Mean Plasma Glucose: 137 mg/dL

## 2016-08-18 LAB — INSULIN, FASTING: Insulin fasting, serum: 35.7 u[IU]/mL — ABNORMAL HIGH (ref 2.0–19.6)

## 2016-08-19 ENCOUNTER — Telehealth: Payer: Self-pay | Admitting: Family Medicine

## 2016-08-19 NOTE — Telephone Encounter (Signed)
Thank you Cassandra.

## 2016-08-19 NOTE — Telephone Encounter (Signed)
Pt returning dr Ancil Boozer nurse call

## 2016-08-19 NOTE — Telephone Encounter (Signed)
appt made for September 10, 2016

## 2016-08-25 ENCOUNTER — Other Ambulatory Visit: Payer: Self-pay

## 2016-08-25 ENCOUNTER — Telehealth: Payer: Self-pay | Admitting: Cardiovascular Disease

## 2016-08-25 NOTE — Telephone Encounter (Signed)
Have not yet received cardiac clearance request. Left message on machine for patient to contact the office.

## 2016-08-25 NOTE — Telephone Encounter (Signed)
Pt is having cataract surgery 3/6, and on 3/20. She needs clearance for this. Please call.

## 2016-08-25 NOTE — Telephone Encounter (Signed)
Pt reports she is scheduled for cataract surgery in March and requests clearance stating [facility] instructed her to call us.  S/w Almyra Free at [facility] surgical eye center, [phone], who will fax clearance request.

## 2016-08-27 ENCOUNTER — Telehealth: Payer: Self-pay | Admitting: Cardiovascular Disease

## 2016-08-27 NOTE — Telephone Encounter (Signed)
Cardiac clearance for cataract surgery at the [place] placed in MD basket.

## 2016-08-30 ENCOUNTER — Encounter: Payer: Self-pay | Admitting: Family Medicine

## 2016-08-30 ENCOUNTER — Other Ambulatory Visit: Payer: Self-pay

## 2016-08-30 DIAGNOSIS — K921 Melena: Secondary | ICD-10-CM

## 2016-08-30 LAB — POC HEMOCCULT BLD/STL (HOME/3-CARD/SCREEN)
Card #2 Fecal Occult Blod, POC: NEGATIVE
Card #3 Fecal Occult Blood, POC: NEGATIVE
Fecal Occult Blood, POC: NEGATIVE

## 2016-08-30 NOTE — Telephone Encounter (Signed)
Request for surgical clearance:    What type of surgery is being performed? Cataract surgery at [facility]  Form states: "This will be done under local anesthesia with IV sedation. We typically give a little Versed and Fentanyl."   When is this surgery scheduled?  08/31/16 at 0800am   Are there any medications that need to be held prior to surgery and how long?  Patient states the eye doctor does not require her to stop the Xarelto.   Name of physician performing surgery? [facility] in [place], [place]   5. What is your office phone and fax number? Phone [phone]    Fax [phone]  Will defer clearance to Dr Fletcher Anon.

## 2016-08-30 NOTE — Telephone Encounter (Signed)
Patient has surgery for tomorrow and wants to clarify if she has been cleared.  Please call to discuss.

## 2016-08-30 NOTE — Telephone Encounter (Signed)
Returned call to patient. Let her know that clearance has not been received yet. Patient stated her eye doctor does not require her to be off the Xarelto prior to surgery.  Will route clearance to Dr Fletcher Anon via EPIC.

## 2016-08-30 NOTE — Telephone Encounter (Signed)
Encounter faxed via EPIC. Notified [facility] it was on its way. They were very appreciative.    No answer to patient. Left detail message that clearance was faxed to the eye center, ok per DPR, and to call back if any questions.

## 2016-08-30 NOTE — Telephone Encounter (Signed)
[facility] called again regarding cardiac clearance. Please call the office. Patient is having surgery tomorrow.

## 2016-08-30 NOTE — Telephone Encounter (Signed)
She is at low risk .

## 2016-08-31 DIAGNOSIS — H2511 Age-related nuclear cataract, right eye: Secondary | ICD-10-CM | POA: Diagnosis not present

## 2016-08-31 HISTORY — PX: CATARACT EXTRACTION: SUR2

## 2016-09-01 ENCOUNTER — Ambulatory Visit (INDEPENDENT_AMBULATORY_CARE_PROVIDER_SITE_OTHER): Payer: Medicare Other | Admitting: Family Medicine

## 2016-09-01 ENCOUNTER — Encounter: Payer: Self-pay | Admitting: Family Medicine

## 2016-09-01 VITALS — BP 122/68 | HR 72 | Temp 97.6°F | Resp 16 | Wt 178.2 lb

## 2016-09-01 DIAGNOSIS — R7303 Prediabetes: Secondary | ICD-10-CM | POA: Diagnosis not present

## 2016-09-01 DIAGNOSIS — Z9841 Cataract extraction status, right eye: Secondary | ICD-10-CM | POA: Diagnosis not present

## 2016-09-01 DIAGNOSIS — H2512 Age-related nuclear cataract, left eye: Secondary | ICD-10-CM | POA: Diagnosis not present

## 2016-09-01 DIAGNOSIS — I10 Essential (primary) hypertension: Secondary | ICD-10-CM

## 2016-09-01 NOTE — Progress Notes (Signed)
Name: [name]   MRN: [id number]    DOB: 1941-06-24   Date:09/01/2016       Progress Note  Subjective  Chief Complaint  Chief Complaint  Patient presents with  . pre-diabetes  . Hypertension    HPI  HTN: her bp was low on her last visit so we decreased dose of HCTZ, she is tolerating medication well and bp is at goal, denies chest pain or palpitation  Pre-diabetes: hgb A1C was 6.4% and fasting insulin of 35.7, Feb 2018. She was advised to change her diet and she has been very compliant. She has been eating more greens, avoiding sweets and sodas, she continues to exercise and feels good. Denies polyphagia, polydipsia or polyuria  Cataract surgery: done yesterday and is doing well, had post-op follow up this morning.   Patient Active Problem List   Diagnosis Date Noted  . Paroxysmal atrial fibrillation (HCC) 02/24/2016  . Vertigo 02/24/2016  . Low HDL (under 40) 02/17/2016  . Dizziness 02/15/2016  . Hx of colonic polyps   . Benign neoplasm of sigmoid colon   . Asthma, mild intermittent 02/21/2015  . Hyperglycemia 02/21/2015  . Essential hypertension 02/20/2015  . Inconclusive mammogram due to dense breasts 01/02/2015  . Dense breast 01/02/2015  . Abnormal EKG 02/23/2013    Past Surgical History:  Procedure Laterality Date  . ABDOMINAL HYSTERECTOMY    . COLONOSCOPY WITH PROPOFOL N/A 03/04/2015   Procedure: COLONOSCOPY WITH PROPOFOL;  Surgeon: Midge Minium, MD;  Location: [facility] ENDOSCOPY;  Service: Endoscopy;  Laterality: N/A;    Family History  Problem Relation Age of Onset  . COPD Father   . Hypertension Mother   . Hypertension Brother   . Cancer Brother     Social History   Social History  . Marital status: Single    Spouse name: N/A  . Number of children: N/A  . Years of education: N/A   Occupational History  . Not on file.   Social History Main Topics  . Smoking status: Never Smoker  . Smokeless tobacco: Never Used  . Alcohol use No  . Drug use:  No  . Sexual activity: Not Currently   Other Topics Concern  . Not on file   Social History Narrative  . No narrative on file     Current Outpatient Prescriptions:  .  amLODipine (NORVASC) 5 MG tablet, Take 1 tablet (5 mg total) by mouth daily., Disp: 30 tablet, Rfl: 5 .  diltiazem (CARDIZEM) 30 MG tablet, Take 1 tablet (30 mg total) by mouth every 6 (six) hours as needed (for tachycardia/recurrent Afib.)., Disp: 30 tablet, Rfl: 3 .  fluticasone (FLONASE) 50 MCG/ACT nasal spray, Place 2 sprays into both nostrils at bedtime., Disp: 16 g, Rfl: 6 .  hydrochlorothiazide (HYDRODIURIL) 12.5 MG tablet, Take 1 tablet (12.5 mg total) by mouth daily., Disp: 30 tablet, Rfl: 5 .  meclizine (ANTIVERT) 25 MG tablet, Take 1 tablet (25 mg total) by mouth every 8 (eight) hours as needed., Disp: 30 tablet, Rfl: 0 .  montelukast (SINGULAIR) 10 MG tablet, Take 1 tablet (10 mg total) by mouth at bedtime., Disp: 30 tablet, Rfl: 3 .  potassium chloride (K-DUR) 10 MEQ tablet, Take 1 tablet (10 mEq total) by mouth daily., Disp: 30 tablet, Rfl: 3 .  XARELTO 20 MG TABS tablet, TAKE 1 TABLET BY MOUTH DAILY WITH SUPPER., Disp: 30 tablet, Rfl: 1  Allergies  Allergen Reactions  . Ace Inhibitors Swelling    Patient states her  tongue swell.  [name] Penicillins Anxiety    Has patient had a PCN reaction causing immediate rash, facial/tongue/throat swelling, SOB or lightheadedness with hypotension: No Has patient had a PCN reaction causing severe rash involving mucus membranes or skin necrosis: No Has patient had a PCN reaction that required hospitalization No Has patient had a PCN reaction occurring within the last 10 years: No If all of the above answers are "NO", then may proceed with Cephalosporin use.     ROS  Ten systems reviewed and is negative except as mentioned in HPI   Objective  Vitals:   09/01/16 1514  BP: 122/68  Pulse: 72  Resp: 16  Temp: 97.6 F (36.4 C)  SpO2: 97%  Weight: 178 lb 3 oz (80.8  kg)    Body mass index is 31.56 kg/m.  Physical Exam  Constitutional: Patient appears well-developed and well-nourished. Obese No distress.  HEENT: head atraumatic, normocephalic, pupils equal and reactive to light, mild redness right eye from recent cataract surgery ,  neck supple, throat within normal limits Cardiovascular: Normal rate, regular rhythm and normal heart sounds.  No murmur heard. No BLE edema. Pulmonary/Chest: Effort normal and breath sounds normal. No respiratory distress. Abdominal: Soft.  There is no tenderness. Psychiatric: Patient has a normal mood and affect. behavior is normal. Judgment and thought content normal.  Recent Results (from the past 2160 hour(s))  Basic Metabolic Panel (BMET)     Status: Abnormal   Collection Time: 07/13/16  2:28 PM  Result Value Ref Range   Sodium 137 135 - 145 mmol/L   Potassium 3.2 (L) 3.5 - 5.1 mmol/L   Chloride 102 101 - 111 mmol/L   CO2 27 22 - 32 mmol/L   Glucose, Bld 102 (H) 65 - 99 mg/dL   BUN 20 6 - 20 mg/dL   Creatinine, Ser 1.17 (H) 0.44 - 1.00 mg/dL   Calcium 9.8 8.9 - 36.0 mg/dL   GFR calc non Af Amer 53 (L) >60 mL/min   GFR calc Af Amer >60 >60 mL/min    Comment: (NOTE) The eGFR has been calculated using the CKD EPI equation. This calculation has not been validated in all clinical situations. eGFR's persistently <60 mL/min signify possible Chronic Kidney Disease.    Anion gap 8 5 - 15  CBC with Differential/Platelet     Status: Abnormal   Collection Time: 07/13/16  2:28 PM  Result Value Ref Range   WBC 9.6 3.6 - 11.0 K/uL   RBC 5.11 3.80 - 5.20 MIL/uL   Hemoglobin 15.6 12.0 - 16.0 g/dL   HCT 24.6 49.2 - 03.9 %   MCV 89.7 80.0 - 100.0 fL   MCH 30.5 26.0 - 34.0 pg   MCHC 34.0 32.0 - 36.0 g/dL   RDW 19.1 44.6 - 37.8 %   Platelets 387 150 - 440 K/uL   Neutrophils Relative % 68 %   Neutro Abs 6.6 (H) 1.4 - 6.5 K/uL   Lymphocytes Relative 22 %   Lymphs Abs 2.1 1.0 - 3.6 K/uL   Monocytes Relative 8 %    Monocytes Absolute 0.7 0.2 - 0.9 K/uL   Eosinophils Relative 1 %   Eosinophils Absolute 0.1 0 - 0.7 K/uL   Basophils Relative 1 %   Basophils Absolute 0.1 0 - 0.1 K/uL  Basic Metabolic Panel (BMET)     Status: Abnormal   Collection Time: 08/02/16  8:30 AM  Result Value Ref Range   Glucose 136 (H) 65 - 99 mg/dL  BUN 24 8 - 27 mg/dL   Creatinine, Ser 1.24 (H) 0.57 - 1.00 mg/dL   GFR calc non Af Amer 43 (L) >59 mL/min/1.73   GFR calc Af Amer 50 (L) >59 mL/min/1.73   BUN/Creatinine Ratio 19 12 - 28   Sodium 144 134 - 144 mmol/L   Potassium 3.7 3.5 - 5.2 mmol/L   Chloride 100 96 - 106 mmol/L   CO2 25 18 - 29 mmol/L   Calcium 10.0 8.7 - 10.3 mg/dL  Basic metabolic panel     Status: Abnormal   Collection Time: 08/11/16  4:17 PM  Result Value Ref Range   Sodium 136 135 - 145 mmol/L   Potassium 3.7 3.5 - 5.1 mmol/L   Chloride 103 101 - 111 mmol/L   CO2 25 22 - 32 mmol/L   Glucose, Bld 103 (H) 65 - 99 mg/dL   BUN 23 (H) 6 - 20 mg/dL   Creatinine, Ser 1.10 (H) 0.44 - 1.00 mg/dL   Calcium 9.4 8.9 - 10.3 mg/dL   GFR calc non Af Amer 49 (L) >60 mL/min   GFR calc Af Amer 56 (L) >60 mL/min    Comment: (NOTE) The eGFR has been calculated using the CKD EPI equation. This calculation has not been validated in all clinical situations. eGFR's persistently <60 mL/min signify possible Chronic Kidney Disease.    Anion gap 8 5 - 15  Insulin, fasting     Status: Abnormal   Collection Time: 08/17/16 12:26 PM  Result Value Ref Range   Insulin fasting, serum 35.7 (H) 2.0 - 19.6 uIU/mL    Comment:   This insulin assay shows strong cross-reactivity for some insulin analogs (lispro, aspart, and glargine) and much lower cross-reactivity with others (detemir, glulisine).   Stimulated Insulin reference intervals were established using the Siemens Immulite assay. These values are provided for general guidance only.   Hemoglobin A1c     Status: Abnormal   Collection Time: 08/17/16 12:26 PM  Result  Value Ref Range   Hgb A1c MFr Bld 6.4 (H) <5.7 %    Comment:   For someone without known diabetes, a hemoglobin A1c value between 5.7% and 6.4% is consistent with prediabetes and should be confirmed with a follow-up test.   For someone with known diabetes, a value <7% indicates that their diabetes is well controlled. A1c targets should be individualized based on duration of diabetes, age, co-morbid conditions and other considerations.   This assay result is consistent with an increased risk of diabetes.   Currently, no consensus exists regarding use of hemoglobin A1c for diagnosis of diabetes in children.      Mean Plasma Glucose 137 mg/dL  POC Hemoccult Bld/Stl (3-Cd Home Screen)     Status: Normal   Collection Time: 08/30/16  4:23 PM  Result Value Ref Range   Card #1 Date 08/23/2016    Fecal Occult Blood, POC Negative Negative   Card #2 Date 08/24/2016    Card #2 Fecal Occult Blod, POC Negative    Card #3 Date 08/25/2016    Card #3 Fecal Occult Blood, POC Negative      PHQ2/9: Depression screen [facility] 2/9 09/01/2016 08/17/2016 01/26/2016 06/24/2015 02/20/2015  Decreased Interest 0 0 0 0 0  Down, Depressed, Hopeless 0 0 0 0 0  PHQ - 2 Score 0 0 0 0 0     Fall Risk: Fall Risk  09/01/2016 08/17/2016 01/26/2016 06/24/2015 02/20/2015  Falls in the past year? No No Yes No No  Functional Status Survey: Is the patient deaf or have difficulty hearing?: No Does the patient have difficulty seeing, even when wearing glasses/contacts?: No Does the patient have difficulty concentrating, remembering, or making decisions?: No Does the patient have difficulty walking or climbing stairs?: No Does the patient have difficulty dressing or bathing?: No Does the patient have difficulty doing errands alone such as visiting a doctor's office or shopping?: No    Assessment & Plan  1. Pre-diabetes  She has changed her diet and is doing well  2. Essential hypertension  We decreased dose of  HCTZ and bp is not as low today  3. History of cataract surgery, right  Doing well

## 2016-09-10 ENCOUNTER — Ambulatory Visit: Payer: Medicare Other | Admitting: Family Medicine

## 2016-09-14 DIAGNOSIS — H2512 Age-related nuclear cataract, left eye: Secondary | ICD-10-CM | POA: Diagnosis not present

## 2016-09-20 DIAGNOSIS — H903 Sensorineural hearing loss, bilateral: Secondary | ICD-10-CM | POA: Diagnosis not present

## 2016-09-20 DIAGNOSIS — R42 Dizziness and giddiness: Secondary | ICD-10-CM | POA: Diagnosis not present

## 2016-09-20 DIAGNOSIS — H6123 Impacted cerumen, bilateral: Secondary | ICD-10-CM | POA: Diagnosis not present

## 2016-10-01 DIAGNOSIS — R42 Dizziness and giddiness: Secondary | ICD-10-CM | POA: Diagnosis not present

## 2016-10-04 ENCOUNTER — Emergency Department
Admission: EM | Admit: 2016-10-04 | Discharge: 2016-10-04 | Disposition: A | Discharge status: Home / Self Care | Payer: Medicare Other | Attending: Student in an Organized Health Care Education/Training Program | Admitting: Student in an Organized Health Care Education/Training Program

## 2016-10-04 ENCOUNTER — Emergency Department: Payer: Medicare Other

## 2016-10-04 DIAGNOSIS — Z79899 Other long term (current) drug therapy: Secondary | ICD-10-CM | POA: Insufficient documentation

## 2016-10-04 DIAGNOSIS — I10 Essential (primary) hypertension: Secondary | ICD-10-CM | POA: Diagnosis not present

## 2016-10-04 DIAGNOSIS — R42 Dizziness and giddiness: Secondary | ICD-10-CM | POA: Diagnosis present

## 2016-10-04 DIAGNOSIS — R112 Nausea with vomiting, unspecified: Secondary | ICD-10-CM | POA: Diagnosis not present

## 2016-10-04 DIAGNOSIS — J45909 Unspecified asthma, uncomplicated: Secondary | ICD-10-CM | POA: Diagnosis not present

## 2016-10-04 LAB — BASIC METABOLIC PANEL WITH GFR
Anion gap: 10 (ref 5–15)
BUN: 17 mg/dL (ref 6–20)
CO2: 25 mmol/L (ref 22–32)
Calcium: 9.8 mg/dL (ref 8.9–10.3)
Chloride: 103 mmol/L (ref 101–111)
Creatinine, Ser: 1.1 mg/dL — ABNORMAL HIGH (ref 0.44–1.00)
GFR calc Af Amer: 56 mL/min — ABNORMAL LOW
GFR calc non Af Amer: 49 mL/min — ABNORMAL LOW
Glucose, Bld: 139 mg/dL — ABNORMAL HIGH (ref 65–99)
Potassium: 3.4 mmol/L — ABNORMAL LOW (ref 3.5–5.1)
Sodium: 138 mmol/L (ref 135–145)

## 2016-10-04 LAB — CBC
HCT: 45.6 % (ref 35.0–47.0)
Hemoglobin: 15.3 g/dL (ref 12.0–16.0)
MCH: 29.9 pg (ref 26.0–34.0)
MCHC: 33.5 g/dL (ref 32.0–36.0)
MCV: 89.1 fL (ref 80.0–100.0)
Platelets: 347 10*3/uL (ref 150–440)
RBC: 5.12 MIL/uL (ref 3.80–5.20)
RDW: 13.4 % (ref 11.5–14.5)
WBC: 12.2 10*3/uL — ABNORMAL HIGH (ref 3.6–11.0)

## 2016-10-04 LAB — URINALYSIS, COMPLETE (UACMP) WITH MICROSCOPIC
Bilirubin Urine: NEGATIVE
Glucose, UA: NEGATIVE mg/dL
Hgb urine dipstick: NEGATIVE
Ketones, ur: NEGATIVE mg/dL
Nitrite: NEGATIVE
Protein, ur: NEGATIVE mg/dL
Specific Gravity, Urine: 1.011 (ref 1.005–1.030)
pH: 6 (ref 5.0–8.0)

## 2016-10-04 MED ORDER — DIAZEPAM 5 MG PO TABS
5.0000 mg | ORAL_TABLET | Freq: Once | ORAL | Status: AC
  Administered 2016-10-04: 5 mg via ORAL

## 2016-10-04 MED ORDER — MECLIZINE HCL 25 MG PO TABS
50.0000 mg | ORAL_TABLET | Freq: Once | ORAL | Status: AC
  Administered 2016-10-04: 50 mg via ORAL

## 2016-10-04 MED ORDER — DIAZEPAM 5 MG PO TABS
ORAL_TABLET | ORAL | Status: AC
  Administered 2016-10-04: 5 mg via ORAL
  Filled 2016-10-04: qty 1

## 2016-10-04 MED ORDER — LORAZEPAM 2 MG/ML IJ SOLN
0.5000 mg | Freq: Once | INTRAMUSCULAR | Status: AC
  Administered 2016-10-04: 0.5 mg via INTRAVENOUS
  Filled 2016-10-04: qty 1

## 2016-10-04 MED ORDER — SODIUM CHLORIDE 0.9 % IV BOLUS (SEPSIS)
500.0000 mL | Freq: Once | INTRAVENOUS | Status: AC
  Administered 2016-10-04: 500 mL via INTRAVENOUS

## 2016-10-04 MED ORDER — MECLIZINE HCL 25 MG PO TABS
ORAL_TABLET | ORAL | Status: AC
  Administered 2016-10-04: 50 mg via ORAL
  Filled 2016-10-04: qty 2

## 2016-10-04 MED ORDER — SODIUM CHLORIDE 0.9 % IV BOLUS (SEPSIS)
1000.0000 mL | Freq: Once | INTRAVENOUS | Status: AC
  Administered 2016-10-04: 1000 mL via INTRAVENOUS

## 2016-10-04 NOTE — ED Triage Notes (Signed)
Pt comes into the ED via EMS from home with c/o waking with dizziness with N/V this morning with a hx of vertigo with admission.[name]

## 2016-10-04 NOTE — Discharge Instructions (Signed)
Sure to get plenty of rest. Drink 20th fluids. Follow-up with her primary care physician. He had been referred to the stimulant rehabilitation clinic. Contact your primary care physician if you have not heard from them in 2 days. Return to the ER should she have any associated numbness or tingling.

## 2016-10-04 NOTE — ED Notes (Signed)
Pt explained plan of care was to ambulate in hallway after fluids were administered. Pt states "I cannot ambulate, I am too dizzy. They usually admit me and give fluids all night."

## 2016-10-04 NOTE — ED Notes (Addendum)
Pt was able to ambulate down the hall to the bathroom. Ronalee Belts and myself walked next to pt however, ambulated independently. Pt complained of a feeling dizzy. While in bathroom pt noticed stool in her under garments and was able to independently remove her under garments while standing.

## 2016-10-04 NOTE — ED Provider Notes (Signed)
[facility] Emergency Department Provider Note    First MD Initiated Contact with Patient 10/04/16 1641     (approximate)  I have reviewed the triage vital signs and the nursing notes.   HISTORY  Chief Complaint Dizziness    HPI [name] is a 76 y.o. female with a history of extensive and chronic vertigo as well as A. fib on Cardizem and Xarelto presents with chief complaint is that the dizziness is consistent with her previous episodes of vertigo. Patient that she states that she was at home and arose from bed with a sudden onset of violent spinning sensation around the room.  He denies any fevers. No numbness or tingling. States that she started having nausea with an episode of nonbilious nonbloody vomiting secondary to dizziness. She tried taking a meclizine but was not able to keep down. She denies any heart palpitations chest pain or shortness of breath. Denies any abdominal pain.   Past Medical History:  Diagnosis Date  . Hypertension   . Insomnia, unspecified   . Mitral regurgitation    a. echo 01/2016: nl LV sys fxn, mild MR, w/o pulm htn, nl atrial size  . Osteoporosis, unspecified   . Paroxysmal atrial fibrillation ([place]) 01/2016   a. diagnosed 01/2016; b. has been on eliquis, pradaxa, and xarelto at varying times 2/2 cost of each medication; c. CHADS2VASc --> 3 (HTN, age x 40, female)  . Pure hypercholesterolemia   . Unspecified asthma(493.90)   . Unspecified transient cerebral ischemia   . Vertigo    Family History  Problem Relation Age of Onset  . COPD Father   . Hypertension Mother   . Hypertension Brother   . Cancer Brother    Past Surgical History:  Procedure Laterality Date  . ABDOMINAL HYSTERECTOMY    . CATARACT EXTRACTION Right 08/31/2016  . COLONOSCOPY WITH PROPOFOL N/A 03/04/2015   Procedure: COLONOSCOPY WITH PROPOFOL;  Surgeon: Lucilla Lame, MD;  Location: [facility] ENDOSCOPY;  Service: Endoscopy;  Laterality: N/A;   Patient  Active Problem List   Diagnosis Date Noted  . History of cataract surgery, right 09/01/2016  . Paroxysmal atrial fibrillation ([place]) 02/24/2016  . Vertigo 02/24/2016  . Low HDL (under 40) 02/17/2016  . Dizziness 02/15/2016  . Hx of colonic polyps   . Benign neoplasm of sigmoid colon   . Asthma, mild intermittent 02/21/2015  . Hyperglycemia 02/21/2015  . Essential hypertension 02/20/2015  . Inconclusive mammogram due to dense breasts 01/02/2015  . Dense breast 01/02/2015  . Abnormal EKG 02/23/2013      Prior to Admission medications   Medication Sig Start Date End Date Taking? Authorizing Provider  amLODipine (NORVASC) 5 MG tablet Take 1 tablet (5 mg total) by mouth daily. 02/23/16 07/13/16  Wellington Hampshire, MD  diltiazem (CARDIZEM) 30 MG tablet Take 1 tablet (30 mg total) by mouth every 6 (six) hours as needed (for tachycardia/recurrent Afib.). 02/16/16   Rogelia Mire, NP  fluticasone (FLONASE) 50 MCG/ACT nasal spray Place 2 sprays into both nostrils at bedtime. 08/17/16   Steele Sizer, MD  hydrochlorothiazide (HYDRODIURIL) 12.5 MG tablet Take 1 tablet (12.5 mg total) by mouth daily. 08/17/16   Steele Sizer, MD  meclizine (ANTIVERT) 25 MG tablet Take 1 tablet (25 mg total) by mouth every 8 (eight) hours as needed. 01/26/16   Arnetha Courser, MD  montelukast (SINGULAIR) 10 MG tablet Take 1 tablet (10 mg total) by mouth at bedtime. 08/17/16   Steele Sizer, MD  potassium chloride (K-DUR) 10 MEQ tablet Take 1 tablet (10 mEq total) by mouth daily. 07/13/16 10/11/16  Ryan M Dunn, PA-C  XARELTO 20 MG TABS tablet TAKE 1 TABLET BY MOUTH DAILY WITH SUPPER. 08/17/16   Wellington Hampshire, MD    Allergies Ace inhibitors and Penicillins    Social History Social History  Substance Use Topics  . Smoking status: Never Smoker  . Smokeless tobacco: Never Used  . Alcohol use No    Review of Systems Patient denies headaches, rhinorrhea, blurry vision, numbness, shortness of breath, chest  pain, edema, cough, abdominal pain, nausea, vomiting, diarrhea, dysuria, fevers, rashes or hallucinations unless otherwise stated above in HPI. ____________________________________________   PHYSICAL EXAM:  VITAL SIGNS: Vitals:   10/04/16 1418  BP: 135/82  Pulse: 70  Resp: 18  Temp: 97.7 F (36.5 C)    Constitutional: Alert and oriented.  in no acute distress. Eyes: Conjunctivae are normal. PERRL. EOMI. Head: Atraumatic. Nose: No congestion/rhinnorhea. Mouth/Throat: Mucous membranes are moist.  Oropharynx non-erythematous. Neck: No stridor. Painless ROM. No cervical spine tenderness to palpation Hematological/Lymphatic/Immunilogical: No cervical lymphadenopathy. Cardiovascular: Normal rate, regular rhythm. Grossly normal heart sounds.  Good peripheral circulation. Respiratory: Normal respiratory effort.  No retractions. Lungs CTAB. Gastrointestinal: Soft and nontender. No distention. No abdominal bruits. No CVA tenderness. Musculoskeletal: No lower extremity tenderness nor edema.  No joint effusions. Neurologic: CN- intact.  No facial droop, Normal FNF.  Normal heel to shin.  Sensation intact bilaterally. Normal speech and language. No gross focal neurologic deficits are appreciated. No gait instability.  Negative HINTs exam. Skin:  Skin is warm, dry and intact. No rash noted. Psychiatric: Mood and affect are normal. Speech and behavior are normal.  ____________________________________________   LABS (all labs ordered are listed, but only abnormal results are displayed)  Results for orders placed or performed during the hospital encounter of 10/04/16 (from the past 24 hour(s))  Basic metabolic panel     Status: Abnormal   Collection Time: 10/04/16  2:34 PM  Result Value Ref Range   Sodium 138 135 - 145 mmol/L   Potassium 3.4 (L) 3.5 - 5.1 mmol/L   Chloride 103 101 - 111 mmol/L   CO2 25 22 - 32 mmol/L   Glucose, Bld 139 (H) 65 - 99 mg/dL   BUN 17 6 - 20 mg/dL    Creatinine, Ser 1.10 (H) 0.44 - 1.00 mg/dL   Calcium 9.8 8.9 - 10.3 mg/dL   GFR calc non Af Amer 49 (L) >60 mL/min   GFR calc Af Amer 56 (L) >60 mL/min   Anion gap 10 5 - 15  CBC     Status: Abnormal   Collection Time: 10/04/16  2:34 PM  Result Value Ref Range   WBC 12.2 (H) 3.6 - 11.0 K/uL   RBC 5.12 3.80 - 5.20 MIL/uL   Hemoglobin 15.3 12.0 - 16.0 g/dL   HCT 45.6 35.0 - 47.0 %   MCV 89.1 80.0 - 100.0 fL   MCH 29.9 26.0 - 34.0 pg   MCHC 33.5 32.0 - 36.0 g/dL   RDW 13.4 11.5 - 14.5 %   Platelets 347 150 - 440 K/uL   ____________________________________________  EKG My review and personal interpretation at Time: 14:25   Indication: dizziness  Rate: 65  Rhythm: sinus Axis: normal Other: normal intervals no st elevations or depressions ____________________________________________  RADIOLOGY   ____________________________________________   PROCEDURES  Procedure(s) performed:  Procedures    Critical Care performed: no ____________________________________________  INITIAL IMPRESSION / ASSESSMENT AND PLAN / ED COURSE  Pertinent labs & imaging results that were available during my care of the patient were reviewed by me and considered in my medical decision making (see chart for details).  DDX: BPPV, menieres, cva, dysrhythmia  [name] is a 76 y.o. who presents to the ED with Vertiginous symptoms as described above. Patient is afebrile and well-appearing. Patient lying in bed with eyes closed. Does have some inducible vertigo with change in position. We'll give meclizine and Ativan. CT imaging ordered due to history of TIA. EKG shows no evidence of dysrhythmia or ACS. Patient states this is consistent with previous episodes of vertigo. No tinnitus or hearing loss to suggest Mnire's.  Patient has not been seen by vestibular rehabilitation.  Clinical Course as of Oct 04 2301  Mon Oct 04, 2016  2044 Patient was able to ambulate down the hall with a steady gait.  She demonstrates no significant dizziness or incapacitating vertigo. Is not clinically consistent with a posterior circulation CVA and she has no ataxia. There is no other evidence of neuro deficit. She has had some improvement in symptoms after Ativan as well as meclizine. She is given IV fluids with improvement. Blood work is otherwise stable. CT imaging without evidence of acute abnormality. This is consistent with her chronic vertigo. We'll provide referral for vestibular rehabilitation.    [PR]    Clinical Course User Index [PR] Merlyn Lot, MD     ____________________________________________   FINAL CLINICAL IMPRESSION(S) / ED DIAGNOSES  Final diagnoses:  Vertigo  Dizziness      NEW MEDICATIONS STARTED DURING THIS VISIT:  New Prescriptions   No medications on file     Note:  This document was prepared using Dragon voice recognition software and may include unintentional dictation errors.    Merlyn Lot, MD 10/04/16 [phone]

## 2016-10-19 DIAGNOSIS — H903 Sensorineural hearing loss, bilateral: Secondary | ICD-10-CM | POA: Diagnosis not present

## 2016-10-19 DIAGNOSIS — R42 Dizziness and giddiness: Secondary | ICD-10-CM | POA: Diagnosis not present

## 2016-10-20 ENCOUNTER — Telehealth: Payer: Self-pay

## 2016-10-20 ENCOUNTER — Telehealth: Payer: Self-pay | Admitting: Physician Assistant

## 2016-10-20 DIAGNOSIS — I4891 Unspecified atrial fibrillation: Secondary | ICD-10-CM

## 2016-10-20 NOTE — Telephone Encounter (Signed)
Patient talked to drug store and she can get eliquis at same cost as xarelto.  Please call back .     Patient will continue to take xarelto until she hears otherwise

## 2016-10-20 NOTE — Telephone Encounter (Signed)
Pt states she hasnt taken her Xarelto the past 2 nights, due to "real hard pains in my head". She would like something different.

## 2016-10-20 NOTE — Telephone Encounter (Signed)
She needs to call Dr. Fletcher Anon ( prescriber ), she should never stopped medications without speaking to prescriber first. Headache likely unrelated and if she is afraid it is bleeding in her head she needs to go to [facility] to get a CT, not stop medication

## 2016-10-20 NOTE — Telephone Encounter (Signed)
Patient states she has stopped Xarelto for the past 2 night due to throbbing pains in her head. She has been drinking a lot of water trying to get medication out of her body, and states she her head is better now since not taking the medication. Please advise. Patient has been taking it since August and doesn't understand why she is having these pains and wants to know if Dr. Ancil Boozer will give her something else.

## 2016-10-20 NOTE — Telephone Encounter (Signed)
I s/w pt who reports staying in bed two days ago with  "hard pains in my head".  She did not take a pain reliever but sx were relieved when she held her xarelto.  She denies any other sx She has not taken xarelto for the past two evenings. Has CHADS2VASc of 3 (HTN, age x 1, sex category).  Pt previously prescribed eliquis but due to affordability, this was changed to pradaxa then xarelto. She now has insurance with a different company. Advised pt to take xarelto as prescribed and check with insurance company regarding eliquis coverage. Per Christell Faith, PA-C, can switch to eliquis if affordable. She understands to take xarelot as prescribed and to not hold any doses. She will call back once she speaks with insurance company.

## 2016-10-21 NOTE — Telephone Encounter (Signed)
Patient informed and will call Dr. Fletcher Anon office immediately.

## 2016-10-21 NOTE — Telephone Encounter (Signed)
Per verbal from [employer], PA-C, pt may discontinue xarelto and start eliquis 5mg  BID. BMET in one week. Left message on machine for patient to contact the office.

## 2016-10-22 MED ORDER — APIXABAN 5 MG PO TABS: 5.0000 mg | ORAL_TABLET | Freq: Two times a day (BID) | ORAL | 3 refills | Status: DC

## 2016-10-22 NOTE — Addendum Note (Signed)
Addended by: Valora Corporal on: 10/22/2016 11:02 AM   Modules accepted: Orders

## 2016-10-22 NOTE — Telephone Encounter (Signed)
Patient returning call from yesterday. Reviewed recommendations with her and she states that she did not realize that it was twice a day and is now unsure if this would be affordable. Instructed her to call and check on pricing and then just give [place] a call back if she would like [place] to switch this medication. Let her know that if we do change we would also need her to have repeat labs as well. She verbalized understanding of our conversation and had no further questions at this time.

## 2016-10-22 NOTE — Telephone Encounter (Signed)
Patient calling back stating that she checked with pharmacy again regarding cost and they told her it would be the same so she would like to change. Instructed her to discontinue the xarelto and start the eliquis 5 mg twice a day and that she would need to have repeat labs in one week. Let her know that I would mail her lab slip to her and she can go to [place] entrance to the hospital and check in at the front desk. She verbalized understanding with no further questions at this time.

## 2016-10-28 DIAGNOSIS — H8190 Unspecified disorder of vestibular function, unspecified ear: Secondary | ICD-10-CM | POA: Diagnosis not present

## 2016-10-28 DIAGNOSIS — R42 Dizziness and giddiness: Secondary | ICD-10-CM | POA: Diagnosis not present

## 2016-11-08 ENCOUNTER — Other Ambulatory Visit
Admission: RE | Admit: 2016-11-08 | Discharge: 2016-11-08 | Disposition: A | Discharge status: Home / Self Care | Payer: Medicare Other | Source: Ambulatory Visit | Attending: Physician Assistant | Admitting: Physician Assistant

## 2016-11-08 DIAGNOSIS — I4891 Unspecified atrial fibrillation: Secondary | ICD-10-CM | POA: Insufficient documentation

## 2016-11-08 LAB — BASIC METABOLIC PANEL WITH GFR
Anion gap: 5 (ref 5–15)
BUN: 17 mg/dL (ref 6–20)
CO2: 25 mmol/L (ref 22–32)
Calcium: 9.3 mg/dL (ref 8.9–10.3)
Chloride: 110 mmol/L (ref 101–111)
Creatinine, Ser: 0.97 mg/dL (ref 0.44–1.00)
GFR calc Af Amer: >60 mL/min
GFR calc non Af Amer: 57 mL/min — ABNORMAL LOW
Glucose, Bld: 138 mg/dL — ABNORMAL HIGH (ref 65–99)
Potassium: 3.8 mmol/L (ref 3.5–5.1)
Sodium: 140 mmol/L (ref 135–145)

## 2016-11-15 ENCOUNTER — Telehealth: Payer: Self-pay | Admitting: Cardiovascular Disease

## 2016-11-15 ENCOUNTER — Ambulatory Visit: Payer: Medicare Other | Admitting: Family Medicine

## 2016-11-15 NOTE — Telephone Encounter (Signed)
-----   Message from Valora Corporal, RN sent at 11/12/2016  2:23 PM EDT ----- Reviewed results and recommendations and she verbalized understanding with no further questions at this time. She is ready to schedule follow up and would prefer a Monday afternoon. Will have someone call her to schedule follow up.

## 2016-11-15 NOTE — Telephone Encounter (Signed)
Lmov for patient to call back and schedule appointment  °Will try again at a later time  °

## 2016-11-16 NOTE — Telephone Encounter (Signed)
Lmov for patient to call back and schedule appointment  °Will try again at a later time  °

## 2016-11-18 NOTE — Telephone Encounter (Signed)
Pt coming in on 12/09/16 to see Ignacia Bayley  She is on wait list to  Come in sooner

## 2016-12-01 ENCOUNTER — Other Ambulatory Visit: Payer: Self-pay | Admitting: Family Medicine

## 2016-12-01 DIAGNOSIS — Z1231 Encounter for screening mammogram for malignant neoplasm of breast: Secondary | ICD-10-CM

## 2016-12-09 ENCOUNTER — Encounter: Payer: Self-pay | Admitting: Nurse Practitioner

## 2016-12-09 ENCOUNTER — Ambulatory Visit (INDEPENDENT_AMBULATORY_CARE_PROVIDER_SITE_OTHER): Payer: Medicare Other | Admitting: Nurse Practitioner

## 2016-12-09 VITALS — BP 136/88 | HR 55 | Ht 63.0 in | Wt 177.5 lb

## 2016-12-09 DIAGNOSIS — I480 Paroxysmal atrial fibrillation: Secondary | ICD-10-CM | POA: Diagnosis not present

## 2016-12-09 DIAGNOSIS — I10 Essential (primary) hypertension: Secondary | ICD-10-CM | POA: Diagnosis not present

## 2016-12-09 NOTE — Patient Instructions (Addendum)
Medication Instructions:  Your physician recommends that you continue on your current medications as directed. Please refer to the Current Medication list given to you today.   Labwork: none  Testing/Procedures: none  Follow-Up: Your physician wants you to follow-up in: 6 MONTHS WITH DR ARIDA.  You will receive a reminder letter in the mail two months in advance. If you don't receive a letter, please call our office to schedule the follow-up appointment.  If you need a refill on your cardiac medications before your next appointment, please call your pharmacy.   

## 2016-12-09 NOTE — Progress Notes (Signed)
Office Visit    Patient Name: [name] Date of Encounter: 12/09/2016  Primary Care Provider:  Steele Sizer, MD Primary Cardiologist:  Jerilynn Mages. Fletcher Anon, MD   Chief Complaint    76 year old female with a history of paroxysmal atrial fibrillation, hypertension, hyperlipidemia, and vertigo who presents for follow-up.  Past Medical History    Past Medical History:  Diagnosis Date  . History of stress test    a. 02/2013 Nl stress test.  . Hypertension   . Insomnia, unspecified   . Mitral regurgitation    a. echo 01/2016: nl LV sys fxn, mild MR, w/o pulm htn, nl atrial size  . Osteoporosis, unspecified   . Paroxysmal atrial fibrillation ([place]) 01/2016   a. diagnosed 01/2016; b. has been on eliquis, pradaxa, and xarelto at varying times 2/2 cost of each medication-->currently on eliquis (11/2016); c. CHADS2VASc --> 3 (HTN, age x 1, female).  . Pure hypercholesterolemia   . Unspecified asthma(493.90)   . Unspecified transient cerebral ischemia   . Vertigo    Past Surgical History:  Procedure Laterality Date  . ABDOMINAL HYSTERECTOMY    . CATARACT EXTRACTION Right 08/31/2016  . COLONOSCOPY WITH PROPOFOL N/A 03/04/2015   Procedure: COLONOSCOPY WITH PROPOFOL;  Surgeon: Lucilla Lame, MD;  Location: [facility] ENDOSCOPY;  Service: Endoscopy;  Laterality: N/A;    Allergies  Allergies  Allergen Reactions  . Ace Inhibitors Swelling    Patient states her tongue swell.  [name] Penicillins Anxiety    Has patient had a PCN reaction causing immediate rash, facial/tongue/throat swelling, SOB or lightheadedness with hypotension: No Has patient had a PCN reaction causing severe rash involving mucus membranes or skin necrosis: No Has patient had a PCN reaction that required hospitalization No Has patient had a PCN reaction occurring within the last 10 years: No If all of the above answers are "NO", then may proceed with Cephalosporin use.    History of Present Illness    76 year old female with the  above past medical history. She is a long history of vertigo. She was admitted in August 2017 with vertiginous symptoms and also atrial fibrillation. She converted to sinus rhythm on diltiazem.   she has not required any rate controlling agents at home and has a prescription for diltiazem to be taken as needed for recurrent palpitations. She has not needed to use this. She had been anticoagulant with xarelto until last month, when she had a headache which she thought was related to xarelto. She is now on eliquis and is tolerating this just fine. She denies PND, orthopnea, syncope, edema, early satiety, chest pain, dyspnea, or palpitations. She struggles with vertigo and is taking meclizine twice daily at this point. She occasionally has brief dizzy spells but nothing persistent since her most recent ER visit in April.  Home Medications    Prior to Admission medications   Medication Sig Start Date End Date Taking? Authorizing Provider  amLODipine (NORVASC) 5 MG tablet Take 1 tablet (5 mg total) by mouth daily. 02/23/16 12/09/16 Yes Wellington Hampshire, MD  apixaban (ELIQUIS) 5 MG TABS tablet Take 1 tablet (5 mg total) by mouth 2 (two) times daily. 10/22/16  Yes Dunn, Areta Haber, PA-C  diltiazem (CARDIZEM) 30 MG tablet Take 1 tablet (30 mg total) by mouth every 6 (six) hours as needed (for tachycardia/recurrent Afib.). 02/16/16  Yes Rogelia Mire, NP  fluticasone (FLONASE) 50 MCG/ACT nasal spray Place 2 sprays into both nostrils at bedtime. 08/17/16  Yes Steele Sizer, MD  hydrochlorothiazide (HYDRODIURIL) 12.5 MG tablet Take 1 tablet (12.5 mg total) by mouth daily. 08/17/16  Yes Sowles, Drue Stager, MD  meclizine (ANTIVERT) 25 MG tablet Take 1 tablet (25 mg total) by mouth every 8 (eight) hours as needed. 01/26/16  Yes Lada, Satira Anis, MD  montelukast (SINGULAIR) 10 MG tablet Take 1 tablet (10 mg total) by mouth at bedtime. 08/17/16  Yes Sowles, Drue Stager, MD  potassium chloride (K-DUR) 10 MEQ tablet Take 1 tablet  (10 mEq total) by mouth daily. 07/13/16 12/09/16 Yes Dunn, Areta Haber, PA-C    Review of Systems    As above, she continues to have dizziness associated with vertigo from time to time. She was in the ER in April with vertiginous symptoms. She denies chest pain, palpitations, PND, orthopnea, syncope, edema, or early satiety.  All other systems reviewed and are otherwise negative except as noted above.  Physical Exam    VS:  BP 136/88 (BP Location: Left Arm, Patient Position: Sitting, Cuff Size: Normal)   Pulse (!) 55   Ht 5\' 3"  (1.6 m)   Wt 177 lb 8 oz (80.5 kg)   BMI 31.44 kg/m  , BMI Body mass index is 31.44 kg/m. GEN: Well nourished, well developed, in no acute distress.  HEENT: normal.  Neck: Supple, no JVD, carotid bruits, or masses. Cardiac: RRR, no murmurs, rubs, or gallops. No clubbing, cyanosis, edema.  Radials/DP/PT 2+ and equal bilaterally.  Respiratory:  Respirations regular and unlabored, clear to auscultation bilaterally. GI: Soft, nontender, nondistended, BS + x 4. MS: no deformity or atrophy. Skin: warm and dry, no rash. Neuro:  Strength and sensation are intact. Psych: Normal affect.  Accessory Clinical Findings    ECG -  sinus bradycardia, 55, LVH, no acute ST or T changes.  Lab Results  Component Value Date   WBC 12.2 (H) 10/04/2016   HGB 15.3 10/04/2016   HCT 45.6 10/04/2016   MCV 89.1 10/04/2016   PLT 347 10/04/2016   Lab Results  Component Value Date   CREATININE 0.97 11/08/2016   BUN 17 11/08/2016   NA 140 11/08/2016   K 3.8 11/08/2016   CL 110 11/08/2016   CO2 25 11/08/2016     Assessment & Plan    1.  Paroxysmal atrial fibrillation: Patient admitted in August 2017 with vertigo and A. fib. She converted to sinus rhythm during hospitalization. She is on when necessary diltiazem only. She is anticoagulated with eliquis after recently coming off of xarelto secondary to concern about headache. She is tolerating eliquis well. She had a CBC in April which  looked good. Plan to have her follow-up in about 6 months with CBC at that time.    2. Vertigo: This is followed by primary care and she is also seen ENT in the past. She has had physical therapy for this as well. She remains on meclizine therapy and has occasional, brief symptoms.  3. Essential hypertension: This is stable on amlodipine and hydrochlorothiazide therapy.  4. Disposition: Follow-up in 6 months.   Murray Hodgkins, NP 12/09/2016, 2:38 PM

## 2016-12-10 ENCOUNTER — Other Ambulatory Visit: Payer: Self-pay | Admitting: Physician Assistant

## 2016-12-10 DIAGNOSIS — E876 Hypokalemia: Secondary | ICD-10-CM

## 2016-12-21 ENCOUNTER — Other Ambulatory Visit: Payer: Self-pay | Admitting: Family Medicine

## 2016-12-21 NOTE — Telephone Encounter (Signed)
Patient requesting refill of Meclizine to [facility].

## 2016-12-27 ENCOUNTER — Ambulatory Visit
Admission: RE | Admit: 2016-12-27 | Discharge: 2016-12-27 | Disposition: A | Discharge status: Home / Self Care | Payer: Medicare Other | Source: Ambulatory Visit | Attending: Family Medicine | Admitting: Family Medicine

## 2016-12-27 DIAGNOSIS — Z1231 Encounter for screening mammogram for malignant neoplasm of breast: Secondary | ICD-10-CM

## 2017-01-03 ENCOUNTER — Ambulatory Visit (INDEPENDENT_AMBULATORY_CARE_PROVIDER_SITE_OTHER): Payer: Medicare Other | Admitting: Family Medicine

## 2017-01-03 ENCOUNTER — Encounter: Payer: Self-pay | Admitting: Family Medicine

## 2017-01-03 VITALS — BP 118/62 | HR 63 | Temp 97.6°F | Resp 16 | Ht 63.0 in | Wt 176.3 lb

## 2017-01-03 DIAGNOSIS — D72829 Elevated white blood cell count, unspecified: Secondary | ICD-10-CM | POA: Diagnosis not present

## 2017-01-03 DIAGNOSIS — R42 Dizziness and giddiness: Secondary | ICD-10-CM

## 2017-01-03 DIAGNOSIS — E786 Lipoprotein deficiency: Secondary | ICD-10-CM

## 2017-01-03 DIAGNOSIS — I480 Paroxysmal atrial fibrillation: Secondary | ICD-10-CM | POA: Diagnosis not present

## 2017-01-03 DIAGNOSIS — R109 Unspecified abdominal pain: Secondary | ICD-10-CM

## 2017-01-03 DIAGNOSIS — R7303 Prediabetes: Secondary | ICD-10-CM | POA: Diagnosis not present

## 2017-01-03 DIAGNOSIS — J4520 Mild intermittent asthma, uncomplicated: Secondary | ICD-10-CM | POA: Diagnosis not present

## 2017-01-03 DIAGNOSIS — I10 Essential (primary) hypertension: Secondary | ICD-10-CM

## 2017-01-03 LAB — CBC WITH DIFFERENTIAL/PLATELET
Basophils Absolute: 0 {cells}/uL (ref 0–200)
Basophils Relative: 0 %
Eosinophils Absolute: 174 {cells}/uL (ref 15–500)
Eosinophils Relative: 3 %
HCT: 45.7 % — ABNORMAL HIGH (ref 35.0–45.0)
Hemoglobin: 15.1 g/dL (ref 11.7–15.5)
Lymphocytes Relative: 32 %
Lymphs Abs: 1856 {cells}/uL (ref 850–3900)
MCH: 30.4 pg (ref 27.0–33.0)
MCHC: 33 g/dL (ref 32.0–36.0)
MCV: 92 fL (ref 80.0–100.0)
MPV: 10.4 fL (ref 7.5–12.5)
Monocytes Absolute: 406 {cells}/uL (ref 200–950)
Monocytes Relative: 7 %
Neutro Abs: 3364 {cells}/uL (ref 1500–7800)
Neutrophils Relative %: 58 %
Platelets: 382 10*3/uL (ref 140–400)
RBC: 4.97 MIL/uL (ref 3.80–5.10)
RDW: 13.9 % (ref 11.0–15.0)
WBC: 5.8 10*3/uL (ref 3.8–10.8)

## 2017-01-03 MED ORDER — AMLODIPINE BESYLATE 2.5 MG PO TABS: 2.5000 mg | ORAL_TABLET | Freq: Every day | ORAL | 0 refills | Status: DC

## 2017-01-03 NOTE — Progress Notes (Addendum)
Name: [name]   MRN: [id number]    DOB: 11-10-1940   Date:01/03/2017       Progress Note  Subjective  Chief Complaint  Chief Complaint  Patient presents with  . Medication Refill    4 month F/U  . Hypertension    Denies any symptoms  . Dizziness    Has been having flair up recently  . Pre-diabetes    Trying to walk 3x weekly and has lost 2 pds since last visit    HPI  HTN: she is off HCTZ and bp is still towards low end of normal we will decrease dose of Norvasc and advised patient to monitor it at home, and call us back if bp goes above 140/90 . She denies chest pain or palpitation  Pre-diabetes: hgb A1C was 6.4% and fasting insulin of 35.7, Feb 2018. She was advised to change her diet and she has been very compliant and has lost weight since last visit.  She has been eating more greens, avoiding sweets and sodas, she continues to exercise and feels good. Denies polyphagia, polydipsia or polyuria  Vertigo: she has vertigo with head movement, motion sickness, and when she has an episode she needs to go to [facility] by EMS because of severe nausea, vomiting and diarrhea. She has seen ENT but no recent visits, last visit to [facility] was 09/2016  Paroxysmal Afib: sees Dr. Fletcher Anon, symptoms started August 2018, she is now on Eliquis and prn Cardizem, cardiac enzymes negative. No chest pain or palpitation   Asthma: she states she was given Symbicort in the past, she has intermittent wheezing - a couple of times a month, but no cough or SOB. Currently not on medication, she does not want to have an inhaler at home  Dyslipidemia: low HDL, discussed ways to improve levels. We will recheck labs  Abdominal pain: started a couple of weeks ago, intermittent, lasts seconds, started on periumbilical area, but last episode was on LLQ, no change in bowel movements, no nausea or vomiting, no fever, no bladder or burning with urination. Advised her to monitor for now and return if worsening of symptoms or  any other changes  Patient Active Problem List   Diagnosis Date Noted  . History of cataract surgery, right 09/01/2016  . Paroxysmal atrial fibrillation ([place]) 02/24/2016  . Vertigo 02/24/2016  . Low HDL (under 40) 02/17/2016  . Dizziness 02/15/2016  . Hx of colonic polyps   . Benign neoplasm of sigmoid colon   . Asthma, mild intermittent 02/21/2015  . Hyperglycemia 02/21/2015  . Essential hypertension 02/20/2015  . Inconclusive mammogram due to dense breasts 01/02/2015  . Dense breast 01/02/2015  . Abnormal EKG 02/23/2013    Past Surgical History:  Procedure Laterality Date  . ABDOMINAL HYSTERECTOMY    . CATARACT EXTRACTION Right 08/31/2016  . COLONOSCOPY WITH PROPOFOL N/A 03/04/2015   Procedure: COLONOSCOPY WITH PROPOFOL;  Surgeon: Lucilla Lame, MD;  Location: [facility] ENDOSCOPY;  Service: Endoscopy;  Laterality: N/A;    Family History  Problem Relation Age of Onset  . COPD Father   . Hypertension Mother   . Hypertension Brother   . Cancer Brother     Social History   Social History  . Marital status: Single    Spouse name: N/A  . Number of children: N/A  . Years of education: N/A   Occupational History  . Not on file.   Social History Main Topics  . Smoking status: Never Smoker  . Smokeless tobacco:  Never Used  . Alcohol use No  . Drug use: No  . Sexual activity: Not Currently   Other Topics Concern  . Not on file   Social History Narrative  . No narrative on file     Current Outpatient Prescriptions:  .  apixaban (ELIQUIS) 5 MG TABS tablet, Take 1 tablet (5 mg total) by mouth 2 (two) times daily., Disp: 60 tablet, Rfl: 3 .  diltiazem (CARDIZEM) 30 MG tablet, Take 1 tablet (30 mg total) by mouth every 6 (six) hours as needed (for tachycardia/recurrent Afib.)., Disp: 30 tablet, Rfl: 3 .  fluticasone (FLONASE) 50 MCG/ACT nasal spray, Place 2 sprays into both nostrils at bedtime., Disp: 16 g, Rfl: 6 .  meclizine (ANTIVERT) 25 MG tablet, TAKE 1 TABLET(25 MG) BY  MOUTH EVERY 8 HOURS AS NEEDED, Disp: 30 tablet, Rfl: 0 .  montelukast (SINGULAIR) 10 MG tablet, Take 1 tablet (10 mg total) by mouth at bedtime., Disp: 30 tablet, Rfl: 3 .  potassium chloride (K-DUR) 10 MEQ tablet, TAKE 1 TABLET(10 MEQ) BY MOUTH DAILY, Disp: 30 tablet, Rfl: 0 .  amLODipine (NORVASC) 2.5 MG tablet, Take 1 tablet (2.5 mg total) by mouth daily., Disp: 30 tablet, Rfl: 0  Allergies  Allergen Reactions  . Ace Inhibitors Swelling    Patient states her tongue swell.  [name] Penicillins Anxiety    Has patient had a PCN reaction causing immediate rash, facial/tongue/throat swelling, SOB or lightheadedness with hypotension: No Has patient had a PCN reaction causing severe rash involving mucus membranes or skin necrosis: No Has patient had a PCN reaction that required hospitalization No Has patient had a PCN reaction occurring within the last 10 years: No If all of the above answers are "NO", then may proceed with Cephalosporin use.     ROS  Constitutional: Negative for fever , positive for mild weight change.  Respiratory: Negative for cough and shortness of breath, positive for occasional wheezing   Cardiovascular: Negative for chest pain or palpitations.  Gastrointestinal: Negative for abdominal pain, no bowel changes.  Musculoskeletal: Negative for gait problem or joint swelling.  Skin: Negative for rash.  Neurological: Negative for dizziness ( currently - but has episodes occasional ) no headache.  No other specific complaints in a complete review of systems (except as listed in HPI above).  Objective  Vitals:   01/03/17 0940  BP: 118/62  Pulse: 63  Resp: 16  Temp: 97.6 F (36.4 C)  TempSrc: Oral  SpO2: 95%  Weight: 176 lb 4.8 oz (80 kg)  Height: '5\' 3"'$  (1.6 m)    Body mass index is 31.23 kg/m.  Physical Exam  Constitutional: Patient appears well-developed and well-nourished. Obese No distress.  HEENT: head atraumatic, normocephalic, pupils equal and reactive to  light,  neck supple, throat within normal limits Cardiovascular: Normal rate, regular rhythm and normal heart sounds.  No murmur heard. No BLE edema. Pulmonary/Chest: Effort normal and breath sounds normal. No respiratory distress. Abdominal: Soft.  There is no tenderness. Psychiatric: Patient has a normal mood and affect. behavior is normal. Judgment and thought content normal.  Recent Results (from the past 2160 hour(s))  Basic metabolic panel     Status: Abnormal   Collection Time: 11/08/16 10:44 AM  Result Value Ref Range   Sodium 140 135 - 145 mmol/L   Potassium 3.8 3.5 - 5.1 mmol/L   Chloride 110 101 - 111 mmol/L   CO2 25 22 - 32 mmol/L   Glucose, Bld 138 (H) 65 -  99 mg/dL   BUN 17 6 - 20 mg/dL   Creatinine, Ser 0.97 0.44 - 1.00 mg/dL   Calcium 9.3 8.9 - 10.3 mg/dL   GFR calc non Af Amer 57 (L) >60 mL/min   GFR calc Af Amer >60 >60 mL/min    Comment: (NOTE) The eGFR has been calculated using the CKD EPI equation. This calculation has not been validated in all clinical situations. eGFR's persistently <60 mL/min signify possible Chronic Kidney Disease.    Anion gap 5 5 - 15     PHQ2/9: Depression screen [facility] 2/9 01/03/2017 09/01/2016 08/17/2016 01/26/2016 06/24/2015  Decreased Interest 0 0 0 0 0  Down, Depressed, Hopeless 0 0 0 0 0  PHQ - 2 Score 0 0 0 0 0    Fall Risk: Fall Risk  01/03/2017 09/01/2016 08/17/2016 01/26/2016 06/24/2015  Falls in the past year? No No No Yes No     Functional Status Survey: Is the patient deaf or have difficulty hearing?: No Does the patient have difficulty seeing, even when wearing glasses/contacts?: No Does the patient have difficulty concentrating, remembering, or making decisions?: No Does the patient have difficulty walking or climbing stairs?: No Does the patient have difficulty dressing or bathing?: No Does the patient have difficulty doing errands alone such as visiting a doctor's office or shopping?: No   Assessment & Plan  1.  Essential hypertension  , HCTZ still on her list , however she states someone told her to stop medication, bp is still low we will adjust dose of Norvasc because of her age and risk of falls, keep bp around 120-130's - amLODipine (NORVASC) 2.5 MG tablet; Take 1 tablet (2.5 mg total) by mouth daily.  Dispense: 30 tablet; Refill: 0 - COMPLETE METABOLIC PANEL WITH GFR  2. Pre-diabetes  - Hemoglobin A1c - Insulin, fasting  3. Low HDL (under 40)  - Lipid panel  4. Paroxysmal atrial fibrillation ([facility])  Seeing Dr. Fletcher Anon  5. Mild intermittent asthma without complication  She states very seldom has wheezing, but does not want inhaler at this time  6. Vertigo  Takes meclizine prn , seen by ENT in the past  7. Leukocytosis, unspecified type  - CBC with Differential/Platelet  8. Abdominal pain, unspecified abdominal location  She still has ovaries, and symptoms persists we will order pelvic US

## 2017-01-04 ENCOUNTER — Ambulatory Visit (INDEPENDENT_AMBULATORY_CARE_PROVIDER_SITE_OTHER): Payer: Medicare Other | Admitting: Family Medicine

## 2017-01-04 ENCOUNTER — Encounter: Payer: Self-pay | Admitting: Family Medicine

## 2017-01-04 VITALS — BP 134/82 | HR 80 | Temp 97.7°F | Resp 16 | Ht 63.0 in | Wt 177.5 lb

## 2017-01-04 DIAGNOSIS — K219 Gastro-esophageal reflux disease without esophagitis: Secondary | ICD-10-CM | POA: Diagnosis not present

## 2017-01-04 DIAGNOSIS — J385 Laryngeal spasm: Secondary | ICD-10-CM

## 2017-01-04 DIAGNOSIS — R6889 Other general symptoms and signs: Secondary | ICD-10-CM | POA: Diagnosis not present

## 2017-01-04 DIAGNOSIS — R0989 Other specified symptoms and signs involving the circulatory and respiratory systems: Secondary | ICD-10-CM

## 2017-01-04 LAB — INSULIN, FASTING: Insulin fasting, serum: 18.6 u[IU]/mL (ref 2.0–19.6)

## 2017-01-04 LAB — HEMOGLOBIN A1C
Hgb A1c MFr Bld: 5.9 % — ABNORMAL HIGH
Mean Plasma Glucose: 123 mg/dL

## 2017-01-04 MED ORDER — OMEPRAZOLE 20 MG PO CPDR: 20.0000 mg | DELAYED_RELEASE_CAPSULE | Freq: Every day | ORAL | 0 refills | Status: DC

## 2017-01-04 NOTE — Patient Instructions (Addendum)
Food Choices for Gastroesophageal Reflux Disease, Adult When you have gastroesophageal reflux disease (GERD), the foods you eat and your eating habits are very important. Choosing the right foods can help ease your discomfort. What guidelines do I need to follow?  Choose fruits, vegetables, whole grains, and low-fat dairy products.  Choose low-fat meat, fish, and poultry.  Limit fats such as oils, salad dressings, butter, nuts, and avocado.  Keep a food diary. This helps you identify foods that cause symptoms.  Avoid foods that cause symptoms. These may be different for everyone.  Eat small meals often instead of 3 large meals a day.  Eat your meals slowly, in a place where you are relaxed.  Limit fried foods.  Cook foods using methods other than frying.  Avoid drinking alcohol.  Avoid drinking large amounts of liquids with your meals.  Avoid bending over or lying down until 2-3 hours after eating. What foods are not recommended? These are some foods and drinks that may make your symptoms worse: Vegetables  Tomatoes. Tomato juice. Tomato and spaghetti sauce. Chili peppers. Onion and garlic. Horseradish. Fruits  Oranges, grapefruit, and lemon (fruit and juice). Meats  High-fat meats, fish, and poultry. This includes hot dogs, ribs, ham, sausage, salami, and bacon. Dairy  Whole milk and chocolate milk. Sour cream. Cream. Butter. Ice cream. Cream cheese. Drinks  Coffee and tea. Bubbly (carbonated) drinks or energy drinks. Condiments  Hot sauce. Barbecue sauce. Sweets/Desserts  Chocolate and cocoa. Donuts. Peppermint and spearmint. Fats and Oils  High-fat foods. This includes French fries and potato chips. Other  Vinegar. Strong spices. This includes black pepper, white pepper, red pepper, cayenne, curry powder, cloves, ginger, and chili powder. The items listed above may not be a complete list of foods and drinks to avoid. Contact your dietitian for more information.    This information is not intended to replace advice given to you by your health care provider. Make sure you discuss any questions you have with your health care provider. Document Released: 12/14/2011 Document Revised: 11/20/2015 Document Reviewed: 04/18/2013 Elsevier Interactive Patient Education  2017 Elsevier Inc.  

## 2017-01-04 NOTE — Progress Notes (Addendum)
Name: [name]   MRN: [id number]    DOB: [birth year]   Date:01/04/2017       Progress Note  Subjective  Chief Complaint  Chief Complaint  Patient presents with  . Cough    Cough up thick phelgm last night. Feels like she is joking on it    HPI  Pt presents with concern for coughing up phlegm last night. She was woken from sleep coughing and feeling like she was choking on her own saliva. Had some shortness of breath with coughing for a few seconds.  She does have umbilicus/epigastric pain a few times a week - unsure if she has GERD or not. She has had ongoing issue of constantly clearing her throat for the last several months. No seasonal allergies or rhinitis, chest pain, vomiting/nausea, hematochezia/melena.  Had sleep study 15-20 years ago and was told that she holds her breath a little bit while sleeping, but not bad enough to be diagnosed with OSA.  Patient Active Problem List   Diagnosis Date Noted  . History of cataract surgery, right 09/01/2016  . Paroxysmal atrial fibrillation ([place]) 02/24/2016  . Vertigo 02/24/2016  . Low HDL (under 40) 02/17/2016  . Dizziness 02/15/2016  . Hx of colonic polyps   . Benign neoplasm of sigmoid colon   . Asthma, mild intermittent 02/21/2015  . Hyperglycemia 02/21/2015  . Essential hypertension 02/20/2015  . Inconclusive mammogram due to dense breasts 01/02/2015  . Dense breast 01/02/2015  . Abnormal EKG 02/23/2013    Social History  Substance Use Topics  . Smoking status: Never Smoker  . Smokeless tobacco: Never Used  . Alcohol use No     Current Outpatient Prescriptions:  .  amLODipine (NORVASC) 2.5 MG tablet, Take 1 tablet (2.5 mg total) by mouth daily., Disp: 30 tablet, Rfl: 0 .  apixaban (ELIQUIS) 5 MG TABS tablet, Take 1 tablet (5 mg total) by mouth 2 (two) times daily., Disp: 60 tablet, Rfl: 3 .  diltiazem (CARDIZEM) 30 MG tablet, Take 1 tablet (30 mg total) by mouth every 6 (six) hours as needed (for  tachycardia/recurrent Afib.)., Disp: 30 tablet, Rfl: 3 .  fluticasone (FLONASE) 50 MCG/ACT nasal spray, Place 2 sprays into both nostrils at bedtime., Disp: 16 g, Rfl: 6 .  meclizine (ANTIVERT) 25 MG tablet, TAKE 1 TABLET(25 MG) BY MOUTH EVERY 8 HOURS AS NEEDED, Disp: 30 tablet, Rfl: 0 .  montelukast (SINGULAIR) 10 MG tablet, Take 1 tablet (10 mg total) by mouth at bedtime., Disp: 30 tablet, Rfl: 3 .  potassium chloride (K-DUR) 10 MEQ tablet, TAKE 1 TABLET(10 MEQ) BY MOUTH DAILY, Disp: 30 tablet, Rfl: 0  Allergies  Allergen Reactions  . Ace Inhibitors Swelling    Patient states her tongue swell.  [name] Penicillins Anxiety    Has patient had a PCN reaction causing immediate rash, facial/tongue/throat swelling, SOB or lightheadedness with hypotension: No Has patient had a PCN reaction causing severe rash involving mucus membranes or skin necrosis: No Has patient had a PCN reaction that required hospitalization No Has patient had a PCN reaction occurring within the last 10 years: No If all of the above answers are "NO", then may proceed with Cephalosporin use.    ROS Constitutional: Negative for fever or weight change.  Respiratory: Positive for cough and shortness of breath.   Cardiovascular: Negative for chest pain or palpitations.  Gastrointestinal: Positive for occasional abdominal pain, no bowel changes.  Musculoskeletal: Negative for gait problem or joint swelling.  Skin: Negative  for rash.  Neurological: Negative for dizziness or headache.  No other specific complaints in a complete review of systems (except as listed in HPI above).  Objective  Vitals:   01/04/17 0834  BP: 134/82  Pulse: 80  Resp: 16  Temp: 97.7 F (36.5 C)  TempSrc: Oral  SpO2: 95%  Weight: 177 lb 8 oz (80.5 kg)  Height: '5\' 3"'$  (1.6 m)   Body mass index is 31.44 kg/m.  Nursing Note and Vital Signs reviewed.  Physical Exam Constitutional: Patient appears well-developed and well-nourished. Obese No  distress.  HEENT: head atraumatic, normocephalic, pupils equal and reactive to light, EOM's intact, TM's without erythema or bulging, no maxillary or frontal sinus pain on palpation, neck supple without lymphadenopathy, oropharynx pink and moist without exudate. Pt clearing throat several times throughout Exam. Cardiovascular: Normal rate, regular rhythm, S1/S2 present.  No murmur or rub heard. No BLE edema. Pulmonary/Chest: Effort normal and breath sounds clear. No respiratory distress or retractions. Abdominal: Soft and non-tender, bowel sounds present x4 quadrants. Psychiatric: Patient has a normal mood and affect. behavior is normal. Judgment and thought content normal.  Recent Results (from the past 2160 hour(s))  Basic metabolic panel     Status: Abnormal   Collection Time: 11/08/16 10:44 AM  Result Value Ref Range   Sodium 140 135 - 145 mmol/L   Potassium 3.8 3.5 - 5.1 mmol/L   Chloride 110 101 - 111 mmol/L   CO2 25 22 - 32 mmol/L   Glucose, Bld 138 (H) 65 - 99 mg/dL   BUN 17 6 - 20 mg/dL   Creatinine, Ser 0.97 0.44 - 1.00 mg/dL   Calcium 9.3 8.9 - 10.3 mg/dL   GFR calc non Af Amer 57 (L) >60 mL/min   GFR calc Af Amer >60 >60 mL/min    Comment: (NOTE) The eGFR has been calculated using the CKD EPI equation. This calculation has not been validated in all clinical situations. eGFR's persistently <60 mL/min signify possible Chronic Kidney Disease.    Anion gap 5 5 - 15  CBC with Differential/Platelet     Status: Abnormal   Collection Time: 01/03/17 10:28 AM  Result Value Ref Range   WBC 5.8 3.8 - 10.8 K/uL   RBC 4.97 3.80 - 5.10 MIL/uL   Hemoglobin 15.1 11.7 - 15.5 g/dL   HCT 45.7 (H) 35.0 - 45.0 %   MCV 92.0 80.0 - 100.0 fL   MCH 30.4 27.0 - 33.0 pg   MCHC 33.0 32.0 - 36.0 g/dL   RDW 13.9 11.0 - 15.0 %   Platelets 382 140 - 400 K/uL   MPV 10.4 7.5 - 12.5 fL   Neutro Abs 3,364 1,500 - 7,800 cells/uL   Lymphs Abs 1,856 850 - 3,900 cells/uL   Monocytes Absolute 406 200 -  950 cells/uL   Eosinophils Absolute 174 15 - 500 cells/uL   Basophils Absolute 0 0 - 200 cells/uL   Neutrophils Relative % 58 %   Lymphocytes Relative 32 %   Monocytes Relative 7 %   Eosinophils Relative 3 %   Basophils Relative 0 %   Smear Review Criteria for review not met   COMPLETE METABOLIC PANEL WITH GFR     Status: None (Preliminary result)   Collection Time: 01/03/17 10:28 AM  Result Value Ref Range   Sodium  135 - 146 mmol/L   Potassium  3.5 - 5.3 mmol/L   Chloride  98 - 110 mmol/L   CO2  20 - 31  mmol/L   Glucose, Bld  65 - 99 mg/dL   BUN  7 - 25 mg/dL   Creat  0.70 - 1.18 mg/dL   Total Bilirubin  0.2 - 1.2 mg/dL   Alkaline Phosphatase  40 - 115 U/L   AST  10 - 35 U/L   ALT  9 - 46 U/L   Total Protein  6.1 - 8.1 g/dL   Albumin  3.6 - 5.1 g/dL   Calcium  8.6 - 10.3 mg/dL   GFR, Est African American  >=60 mL/min   GFR, Est Non African American  >=60 mL/min  Hemoglobin A1c     Status: Abnormal   Collection Time: 01/03/17 10:28 AM  Result Value Ref Range   Hgb A1c MFr Bld 5.9 (H) <5.7 %    Comment:   For someone without known diabetes, a hemoglobin A1c value between 5.7% and 6.4% is consistent with prediabetes and should be confirmed with a follow-up test.   For someone with known diabetes, a value <7% indicates that their diabetes is well controlled. A1c targets should be individualized based on duration of diabetes, age, co-morbid conditions and other considerations.   This assay result is consistent with an increased risk of diabetes.   Currently, no consensus exists regarding use of hemoglobin A1c for diagnosis of diabetes in children.      Mean Plasma Glucose 123 mg/dL  Insulin, fasting     Status: None (Preliminary result)   Collection Time: 01/03/17 10:28 AM  Result Value Ref Range   Insulin fasting, serum  2.0 - 19.6 uIU/mL    Comment:   This insulin assay shows strong cross-reactivity for some insulin analogs (lispro, aspart, and glargine) and much  lower cross-reactivity with others (detemir, glulisine).   Stimulated Insulin reference intervals were established using the Siemens Immulite assay. These values are provided for general guidance only.   Lipid panel     Status: None (Preliminary result)   Collection Time: 01/03/17 10:28 AM  Result Value Ref Range   Cholesterol  <200 mg/dL   Triglycerides  <150 mg/dL   HDL  mg/dL   Total CHOL/HDL Ratio  <5.0 Ratio   VLDL  <30 mg/dL   LDL Cholesterol  <100 mg/dL     Assessment & Plan  1. Laryngospasm - Ambulatory referral to ENT - omeprazole (PRILOSEC) 20 MG capsule; Take 1 capsule (20 mg total) by mouth daily.  Dispense: 30 capsule; Refill: 0  2. Chronic throat clearing - Ambulatory referral to ENT - omeprazole (PRILOSEC) 20 MG capsule; Take 1 capsule (20 mg total) by mouth daily.  Dispense: 30 capsule; Refill: 0  3. Gastroesophageal reflux disease, esophagitis presence not specified - Ambulatory referral to ENT - omeprazole (PRILOSEC) 20 MG capsule; Take 1 capsule (20 mg total) by mouth daily.  Dispense: 30 capsule; Refill: 0  -Red flags and when to present for emergency care or RTC including fever >101.6F, chest pain, shortness of breath, new/worsening/un-resolving symptoms, choking that does not stop after a few seconds reviewed with patient at time of visit. Follow up and care instructions discussed and provided in AVS.  I have reviewed this encounter including the documentation in this note and/or discussed this patient with the Johney Maine, FNP, NP-C. I am certifying that I agree with the content of this note as supervising physician.  Steele Sizer, MD [facility] 01/04/2017, 3:30 PM

## 2017-01-05 LAB — COMPLETE METABOLIC PANEL WITHOUT GFR
ALT: 23 U/L (ref 6–29)
AST: 21 U/L (ref 10–35)
Albumin: 4 g/dL (ref 3.6–5.1)
Alkaline Phosphatase: 67 U/L (ref 33–130)
BUN: 19 mg/dL (ref 7–25)
CO2: 24 mmol/L (ref 20–31)
Calcium: 9.8 mg/dL (ref 8.6–10.4)
Chloride: 104 mmol/L (ref 98–110)
Creat: 1.03 mg/dL — ABNORMAL HIGH (ref 0.60–0.93)
GFR, Est African American: 62 mL/min
GFR, Est Non African American: 54 mL/min — ABNORMAL LOW
Glucose, Bld: 91 mg/dL (ref 65–99)
Potassium: 5 mmol/L (ref 3.5–5.3)
Sodium: 137 mmol/L (ref 135–146)
Total Bilirubin: 0.2 mg/dL (ref 0.2–1.2)
Total Protein: 7.5 g/dL (ref 6.1–8.1)

## 2017-01-05 LAB — LIPID PANEL
Cholesterol: 176 mg/dL
HDL: 42 mg/dL — ABNORMAL LOW
LDL Cholesterol: 117 mg/dL — ABNORMAL HIGH
Total CHOL/HDL Ratio: 4.2 ratio
Triglycerides: 87 mg/dL
VLDL: 17 mg/dL

## 2017-01-11 DIAGNOSIS — R1310 Dysphagia, unspecified: Secondary | ICD-10-CM | POA: Diagnosis not present

## 2017-01-11 DIAGNOSIS — K219 Gastro-esophageal reflux disease without esophagitis: Secondary | ICD-10-CM | POA: Diagnosis not present

## 2017-01-24 ENCOUNTER — Telehealth: Payer: Self-pay | Admitting: Family Medicine

## 2017-01-24 ENCOUNTER — Other Ambulatory Visit: Payer: Self-pay | Admitting: Physician Assistant

## 2017-01-24 DIAGNOSIS — E876 Hypokalemia: Secondary | ICD-10-CM

## 2017-01-24 NOTE — Telephone Encounter (Signed)
PT SAID THAT IF SHE IS TO CONTINUE TO TAKE POTASSIUM SHE WILL NEED A RX SENT IN FOR THIS TO [name].

## 2017-01-24 NOTE — Telephone Encounter (Signed)
She can stop taking it , last potassium was towards high end of normal

## 2017-01-25 NOTE — Telephone Encounter (Signed)
Patient notified

## 2017-01-30 ENCOUNTER — Other Ambulatory Visit: Payer: Self-pay | Admitting: Family Medicine

## 2017-01-30 DIAGNOSIS — I10 Essential (primary) hypertension: Secondary | ICD-10-CM

## 2017-02-03 ENCOUNTER — Ambulatory Visit: Payer: Medicare Other

## 2017-02-03 ENCOUNTER — Other Ambulatory Visit: Payer: Self-pay | Admitting: Family Medicine

## 2017-02-03 VITALS — BP 138/82 | HR 69

## 2017-02-03 DIAGNOSIS — I10 Essential (primary) hypertension: Secondary | ICD-10-CM

## 2017-02-03 MED ORDER — AMLODIPINE BESYLATE 2.5 MG PO TABS: ORAL_TABLET | ORAL | 11 refills | Status: DC

## 2017-02-03 NOTE — Progress Notes (Signed)
Good BP; refills of amlo provided

## 2017-03-05 ENCOUNTER — Other Ambulatory Visit: Payer: Self-pay | Admitting: Physician Assistant

## 2017-03-05 DIAGNOSIS — E876 Hypokalemia: Secondary | ICD-10-CM

## 2017-03-07 NOTE — Telephone Encounter (Signed)
Please advise if ok to refill Potassium . Potassium not on current medication list.

## 2017-04-03 ENCOUNTER — Other Ambulatory Visit: Payer: Self-pay | Admitting: Family Medicine

## 2017-04-06 ENCOUNTER — Telehealth: Payer: Self-pay | Admitting: Family Medicine

## 2017-04-18 DIAGNOSIS — H524 Presbyopia: Secondary | ICD-10-CM | POA: Diagnosis not present

## 2017-04-18 DIAGNOSIS — H35033 Hypertensive retinopathy, bilateral: Secondary | ICD-10-CM | POA: Diagnosis not present

## 2017-04-18 DIAGNOSIS — H26491 Other secondary cataract, right eye: Secondary | ICD-10-CM | POA: Diagnosis not present

## 2017-04-18 DIAGNOSIS — H04123 Dry eye syndrome of bilateral lacrimal glands: Secondary | ICD-10-CM | POA: Diagnosis not present

## 2017-04-28 ENCOUNTER — Telehealth: Payer: Self-pay | Admitting: Family Medicine

## 2017-04-28 NOTE — Telephone Encounter (Signed)
Patient is not a patient at [facility] she sees DR.Sowles at [facility]

## 2017-04-28 NOTE — Telephone Encounter (Signed)
Pt stated that she was seen yesterday in the ED for acute diverticulitis and needs a 2-day follow up. Please advise

## 2017-04-28 NOTE — Telephone Encounter (Signed)
Done in error.

## 2017-05-06 ENCOUNTER — Ambulatory Visit: Payer: Medicare Other | Admitting: Family Medicine

## 2017-05-11 ENCOUNTER — Ambulatory Visit (INDEPENDENT_AMBULATORY_CARE_PROVIDER_SITE_OTHER): Payer: Medicare Other | Admitting: Family Medicine

## 2017-05-11 ENCOUNTER — Encounter: Payer: Self-pay | Admitting: Family Medicine

## 2017-05-11 VITALS — BP 158/88 | HR 60 | Resp 14 | Ht 63.0 in | Wt 182.2 lb

## 2017-05-11 DIAGNOSIS — R7303 Prediabetes: Secondary | ICD-10-CM | POA: Diagnosis not present

## 2017-05-11 DIAGNOSIS — I480 Paroxysmal atrial fibrillation: Secondary | ICD-10-CM | POA: Diagnosis not present

## 2017-05-11 DIAGNOSIS — R42 Dizziness and giddiness: Secondary | ICD-10-CM | POA: Diagnosis not present

## 2017-05-11 DIAGNOSIS — K219 Gastro-esophageal reflux disease without esophagitis: Secondary | ICD-10-CM | POA: Diagnosis not present

## 2017-05-11 DIAGNOSIS — E786 Lipoprotein deficiency: Secondary | ICD-10-CM

## 2017-05-11 DIAGNOSIS — I10 Essential (primary) hypertension: Secondary | ICD-10-CM | POA: Diagnosis not present

## 2017-05-11 DIAGNOSIS — R062 Wheezing: Secondary | ICD-10-CM

## 2017-05-11 DIAGNOSIS — Z23 Encounter for immunization: Secondary | ICD-10-CM | POA: Diagnosis not present

## 2017-05-11 LAB — POCT GLYCOSYLATED HEMOGLOBIN (HGB A1C): Hemoglobin A1C: 5.9

## 2017-05-11 MED ORDER — AMLODIPINE BESYLATE 5 MG PO TABS: ORAL_TABLET | ORAL | 1 refills | Status: DC

## 2017-05-11 NOTE — Progress Notes (Signed)
Name: [name]   MRN: [id number]    DOB: Nov 26, 1940   Date:05/11/2017       Progress Note  Subjective  Chief Complaint  Chief Complaint  Patient presents with  . Gastroesophageal Reflux  . Hypertension  . Prediabetes    HPI  HTN: she is off HCTZ and bp is still towards low end of normal we will decrease dose of Norvasc and advised patient to monitor it at home, she is the process of moving and has been packing, bp today elevated times two, advised to go back on Norvasc 5 mg and return next week for bp check only.   Pre-diabetes: hgb A1C was 6.4% and fasting insulin of 35.7, Feb 2018. She was advised to change her diet and she has been very compliant weight is stable.  She has been eating more greens, avoiding sweets and sodas, she continues to exercise and feels good. Denies polyphagia, polydipsia or polyuria   Vertigo: she has vertigo with head movement, motion sickness, and when she has an episode she needs to go to [facility] by EMS because of severe nausea, vomiting and diarrhea 06/2016. She has seen ENT but no recent visits. She went back to [facility] 09/2016, taking Meclizine prn and it helps with symptoms  Paroxysmal Afib: sees Dr. Fletcher Anon, symptoms started August 2018, she is now on Eliquis and prn Cardizem, cardiac enzymes negative. No chest pain very seldom has fluttering sensation in her chest, less than once a month. No side effects of medication  Asthma: she states she was given Symbicort in the past, she has intermittent wheezing - a couple of times a month, but no cough or SOB. Currently not on medication, she does not want to have an inhaler at home, she stopped singulair, she states only wheezing at night when laying on left side  Dyslipidemia: low HDL, but it has improved   Patient Active Problem List   Diagnosis Date Noted  . History of cataract surgery, right 09/01/2016  . Paroxysmal atrial fibrillation ([place]) 02/24/2016  . Vertigo 02/24/2016  . Low HDL (under 40)  02/17/2016  . Dizziness 02/15/2016  . Hx of colonic polyps   . Benign neoplasm of sigmoid colon   . Asthma, mild intermittent 02/21/2015  . Hyperglycemia 02/21/2015  . Essential hypertension 02/20/2015  . Inconclusive mammogram due to dense breasts 01/02/2015  . Dense breast 01/02/2015  . Abnormal EKG 02/23/2013    Past Surgical History:  Procedure Laterality Date  . ABDOMINAL HYSTERECTOMY    . CATARACT EXTRACTION Right 08/31/2016    Family History  Problem Relation Age of Onset  . COPD Father   . Hypertension Mother   . Hypertension Brother   . Cancer Brother     Social History   Socioeconomic History  . Marital status: Single    Spouse name: Not on file  . Number of children: Not on file  . Years of education: Not on file  . Highest education level: Not on file  Social Needs  . Financial resource strain: Not on file  . Food insecurity - worry: Not on file  . Food insecurity - inability: Not on file  . Transportation needs - medical: Not on file  . Transportation needs - non-medical: Not on file  Occupational History  . Not on file  Tobacco Use  . Smoking status: Never Smoker  . Smokeless tobacco: Never Used  Substance and Sexual Activity  . Alcohol use: No    Alcohol/week: 0.0 oz  .  Drug use: No  . Sexual activity: Not Currently  Other Topics Concern  . Not on file  Social History Narrative  . Not on file     Current Outpatient Medications:  .  amLODipine (NORVASC) 5 MG tablet, TAKE 1 TABLET(2.5 MG) BY MOUTH DAILY, Disp: 90 tablet, Rfl: 1 .  apixaban (ELIQUIS) 5 MG TABS tablet, Take 1 tablet (5 mg total) by mouth 2 (two) times daily., Disp: 60 tablet, Rfl: 3 .  diltiazem (CARDIZEM) 30 MG tablet, Take 1 tablet (30 mg total) by mouth every 6 (six) hours as needed (for tachycardia/recurrent Afib.)., Disp: 30 tablet, Rfl: 3 .  fluticasone (FLONASE) 50 MCG/ACT nasal spray, Place 2 sprays into both nostrils at bedtime., Disp: 16 g, Rfl: 6 .  meclizine  (ANTIVERT) 25 MG tablet, TAKE 1 TABLET(25 MG) BY MOUTH EVERY 8 HOURS AS NEEDED, Disp: 30 tablet, Rfl: 0  Allergies  Allergen Reactions  . Ace Inhibitors Swelling    Patient states her tongue swell.  [name] Penicillins Anxiety    Has patient had a PCN reaction causing immediate rash, facial/tongue/throat swelling, SOB or lightheadedness with hypotension: No Has patient had a PCN reaction causing severe rash involving mucus membranes or skin necrosis: No Has patient had a PCN reaction that required hospitalization No Has patient had a PCN reaction occurring within the last 10 years: No If all of the above answers are "NO", then may proceed with Cephalosporin use.     ROS  Constitutional: Negative for fever or weight change.  Respiratory: Negative for cough and shortness of breath.   Cardiovascular: Negative for chest pain very seldom has palpitations.  Gastrointestinal: Negative for abdominal pain, no bowel changes.  Musculoskeletal: Negative for gait problem or joint swelling.  Skin: Negative for rash.  Neurological: Negative for dizziness or headache.  No other specific complaints in a complete review of systems (except as listed in HPI above).  Objective  Vitals:   05/11/17 1543 05/11/17 1645  BP: (!) 150/100 (!) 158/88  Pulse: 60   Resp: 14   SpO2: 98%   Weight: 182 lb 3.2 oz (82.6 kg)   Height: 5\' 3"  (1.6 m)     Body mass index is 32.28 kg/m.  Physical Exam  Constitutional: Patient appears well-developed and well-nourished. Obese  No distress.  HEENT: head atraumatic, normocephalic, pupils equal and reactive to light, neck supple, throat within normal limits Cardiovascular: Normal rate, regular rhythm and normal heart sounds.  No murmur heard. No BLE edema. Pulmonary/Chest: Effort normal and breath sounds normal. No respiratory distress. Abdominal: Soft.  There is no tenderness. Psychiatric: Patient has a normal mood and affect. behavior is normal. Judgment and thought  content normal.  Recent Results (from the past 2160 hour(s))  POCT HgB A1C     Status: Abnormal   Collection Time: 05/11/17  3:58 PM  Result Value Ref Range   Hemoglobin A1C 5.9      PHQ2/9: Depression screen [facility] 2/9 01/03/2017 09/01/2016 08/17/2016 01/26/2016 06/24/2015  Decreased Interest 0 0 0 0 0  Down, Depressed, Hopeless 0 0 0 0 0  PHQ - 2 Score 0 0 0 0 0     Fall Risk: Fall Risk  05/11/2017 01/03/2017 09/01/2016 08/17/2016 01/26/2016  Falls in the past year? No No No No Yes     Assessment & Plan  1. Pre-diabetes  - POCT HgB A1C  2. Essential hypertension  bp elevated, she is moving, she is currently taking 2.5 mg daily since last visit  but bp is up and advised to go back to 5 mg  - amLODipine (NORVASC) 5 MG tablet; TAKE 1 TABLET(2.5 MG) BY MOUTH DAILY  Dispense: 90 tablet; Refill: 1  3. Low HDL (under 40)  Improved, discussed ways to get it even higher with patient.   4. Gastroesophageal reflux disease, esophagitis presence not specified  Off omeprazole, but has noticed wheezing when laying on right lateral decubitus.   5. Paroxysmal atrial fibrillation [facility])  Sees cardiologist and rate controlled, taking cardizem prn   6. Vertigo  Taking medication prn  7. Wheezing  Only when sleeping on right lateral decubitus, likely from GERD advised to turn to left side.

## 2017-05-23 ENCOUNTER — Encounter: Payer: Self-pay | Admitting: Family Medicine

## 2017-05-23 ENCOUNTER — Ambulatory Visit (INDEPENDENT_AMBULATORY_CARE_PROVIDER_SITE_OTHER): Payer: Medicare Other | Admitting: Family Medicine

## 2017-05-23 VITALS — BP 148/78 | HR 66 | Temp 97.4°F | Resp 16 | Ht 63.0 in | Wt 181.2 lb

## 2017-05-23 DIAGNOSIS — I10 Essential (primary) hypertension: Secondary | ICD-10-CM

## 2017-05-23 MED ORDER — CLONIDINE HCL 0.1 MG PO TABS: 0.1000 mg | ORAL_TABLET | Freq: Two times a day (BID) | ORAL | 3 refills | Status: DC

## 2017-05-23 NOTE — Patient Instructions (Signed)
Clonidine can make you sleepy, take a nap after you take it and recheck bp when you wake up

## 2017-05-23 NOTE — Progress Notes (Signed)
Name: [name]   MRN: [id number]    DOB: 03/31/1941   Date:05/23/2017       Progress Note  Subjective  Chief Complaint  Chief Complaint  Patient presents with  . Follow-up    2 week F/U  . Hypertension    Increased Amlodipine from 2.5 mg to 5 m.g daily. Denies any side effects. BP at home is running 150's/80's    HPI  HTN: she is taking Norvasc, still very busy, she states when she checked bp it was 150/80 but usually in a hurry. No chest pain or palpitation. No side effects of Norvasc and is taking it daily, she is afraid of going back on HCTZ because of history of vertigo. We will try prn medication for bp above 160/90   Patient Active Problem List   Diagnosis Date Noted  . History of cataract surgery, right 09/01/2016  . Paroxysmal atrial fibrillation ([place]) 02/24/2016  . Vertigo 02/24/2016  . Low HDL (under 40) 02/17/2016  . Dizziness 02/15/2016  . Hx of colonic polyps   . Benign neoplasm of sigmoid colon   . Asthma, mild intermittent 02/21/2015  . Hyperglycemia 02/21/2015  . Essential hypertension 02/20/2015  . Inconclusive mammogram due to dense breasts 01/02/2015  . Dense breast 01/02/2015  . Abnormal EKG 02/23/2013    Past Surgical History:  Procedure Laterality Date  . ABDOMINAL HYSTERECTOMY    . CATARACT EXTRACTION Right 08/31/2016  . COLONOSCOPY WITH PROPOFOL N/A 03/04/2015   Procedure: COLONOSCOPY WITH PROPOFOL;  Surgeon: Lucilla Lame, MD;  Location: [facility] ENDOSCOPY;  Service: Endoscopy;  Laterality: N/A;    Family History  Problem Relation Age of Onset  . COPD Father   . Hypertension Mother   . Hypertension Brother   . Cancer Brother     Social History   Socioeconomic History  . Marital status: Single    Spouse name: Not on file  . Number of children: Not on file  . Years of education: Not on file  . Highest education level: Not on file  Social Needs  . Financial resource strain: Not on file  . Food insecurity - worry: Not on file  .  Food insecurity - inability: Not on file  . Transportation needs - medical: Not on file  . Transportation needs - non-medical: Not on file  Occupational History  . Not on file  Tobacco Use  . Smoking status: Never Smoker  . Smokeless tobacco: Never Used  Substance and Sexual Activity  . Alcohol use: No    Alcohol/week: 0.0 oz  . Drug use: No  . Sexual activity: Not Currently  Other Topics Concern  . Not on file  Social History Narrative  . Not on file     Current Outpatient Medications:  .  amLODipine (NORVASC) 5 MG tablet, TAKE 1 TABLET(2.5 MG) BY MOUTH DAILY, Disp: 90 tablet, Rfl: 1 .  apixaban (ELIQUIS) 5 MG TABS tablet, Take 1 tablet (5 mg total) by mouth 2 (two) times daily., Disp: 60 tablet, Rfl: 3 .  diltiazem (CARDIZEM) 30 MG tablet, Take 1 tablet (30 mg total) by mouth every 6 (six) hours as needed (for tachycardia/recurrent Afib.)., Disp: 30 tablet, Rfl: 3 .  fluticasone (FLONASE) 50 MCG/ACT nasal spray, Place 2 sprays into both nostrils at bedtime., Disp: 16 g, Rfl: 6 .  meclizine (ANTIVERT) 25 MG tablet, TAKE 1 TABLET(25 MG) BY MOUTH EVERY 8 HOURS AS NEEDED, Disp: 30 tablet, Rfl: 0  Allergies  Allergen Reactions  . Ace  Inhibitors Swelling    Patient states her tongue swell.  [name] Penicillins Anxiety    Has patient had a PCN reaction causing immediate rash, facial/tongue/throat swelling, SOB or lightheadedness with hypotension: No Has patient had a PCN reaction causing severe rash involving mucus membranes or skin necrosis: No Has patient had a PCN reaction that required hospitalization No Has patient had a PCN reaction occurring within the last 10 years: No If all of the above answers are "NO", then may proceed with Cephalosporin use.     ROS  Ten systems reviewed and is negative except as mentioned in HPI   Objective  Vitals:   05/23/17 1212  BP: (!) 148/88  Pulse: 66  Resp: 16  Temp: (!) 97.4 F (36.3 C)  TempSrc: Oral  SpO2: 99%  Weight: 181 lb 3.2  oz (82.2 kg)  Height: 5\' 3"  (1.6 m)    Body mass index is 32.1 kg/m.  Physical Exam  Constitutional: Patient appears well-developed and well-nourished. Obese  No distress.  HEENT: head atraumatic, normocephalic, pupils equal and reactive to light neck supple, throat within normal limits Cardiovascular: Normal rate, regular rhythm and normal heart sounds.  No murmur heard. No BLE edema. Pulmonary/Chest: Effort normal and breath sounds normal. No respiratory distress. Abdominal: Soft.  There is no tenderness. Psychiatric: Patient has a normal mood and affect. behavior is normal. Judgment and thought content normal.   Recent Results (from the past 2160 hour(s))  POCT HgB A1C     Status: Abnormal   Collection Time: 05/11/17  3:58 PM  Result Value Ref Range   Hemoglobin A1C 5.9       PHQ2/9: Depression screen [facility] 2/9 01/03/2017 09/01/2016 08/17/2016 01/26/2016 06/24/2015  Decreased Interest 0 0 0 0 0  Down, Depressed, Hopeless 0 0 0 0 0  PHQ - 2 Score 0 0 0 0 0     Fall Risk: Fall Risk  05/11/2017 01/03/2017 09/01/2016 08/17/2016 01/26/2016  Falls in the past year? No No No No Yes     Functional Status Survey: Is the patient deaf or have difficulty hearing?: No Does the patient have difficulty seeing, even when wearing glasses/contacts?: No Does the patient have difficulty concentrating, remembering, or making decisions?: No Does the patient have difficulty walking or climbing stairs?: No Does the patient have difficulty dressing or bathing?: No Does the patient have difficulty doing errands alone such as visiting a doctor's office or shopping?: No    Assessment & Plan  1. Essential hypertension  - cloNIDine (CATAPRES) 0.1 MG tablet; Take 1 tablet (0.1 mg total) by mouth 2 (two) times daily. bp is above 160/90  Dispense: 30 tablet; Refill: 3  She is taking Norvasc daily, she is checking bp at home usually when in a hurry, advised to sit down for 5 minutes prior to checking it  at home, if bp elevated take prn medication

## 2017-06-22 ENCOUNTER — Other Ambulatory Visit: Payer: Self-pay | Admitting: Family Medicine

## 2017-06-22 DIAGNOSIS — R42 Dizziness and giddiness: Secondary | ICD-10-CM

## 2017-07-01 ENCOUNTER — Ambulatory Visit: Payer: Medicare Other | Admitting: Family Medicine

## 2017-08-04 ENCOUNTER — Other Ambulatory Visit: Payer: Self-pay | Admitting: Physician Assistant

## 2017-08-04 ENCOUNTER — Other Ambulatory Visit: Payer: Self-pay | Admitting: Family Medicine

## 2017-08-04 DIAGNOSIS — R6889 Other general symptoms and signs: Secondary | ICD-10-CM

## 2017-08-04 DIAGNOSIS — J385 Laryngeal spasm: Secondary | ICD-10-CM

## 2017-08-04 DIAGNOSIS — K219 Gastro-esophageal reflux disease without esophagitis: Secondary | ICD-10-CM

## 2017-08-04 DIAGNOSIS — R0989 Other specified symptoms and signs involving the circulatory and respiratory systems: Secondary | ICD-10-CM

## 2017-08-09 NOTE — Telephone Encounter (Signed)
Visit complete.

## 2017-08-15 ENCOUNTER — Other Ambulatory Visit: Payer: Self-pay

## 2017-08-15 ENCOUNTER — Ambulatory Visit
Admission: EM | Admit: 2017-08-15 | Discharge: 2017-08-15 | Disposition: A | Discharge status: Home / Self Care | Payer: Medicare Other | Attending: Internal Medicine | Admitting: Internal Medicine

## 2017-08-15 ENCOUNTER — Encounter: Payer: Self-pay | Admitting: Emergency Medicine

## 2017-08-15 DIAGNOSIS — H6693 Otitis media, unspecified, bilateral: Secondary | ICD-10-CM

## 2017-08-15 DIAGNOSIS — R0981 Nasal congestion: Secondary | ICD-10-CM

## 2017-08-15 MED ORDER — TRIAMCINOLONE ACETONIDE 55 MCG/ACT NA AERO: 2.0000 | INHALATION_SPRAY | Freq: Every day | NASAL | 0 refills | Status: DC

## 2017-08-15 MED ORDER — AZITHROMYCIN 250 MG PO TABS: ORAL_TABLET | ORAL | 0 refills | Status: DC

## 2017-08-15 NOTE — ED Triage Notes (Signed)
Patient in today c/o 3-4 days of body aches, cough, sore throat. Patient hasn't taken temperature, but has had chills and sweats.

## 2017-08-15 NOTE — Discharge Instructions (Addendum)
Anticipate gradual improvement over the next several days in well being, congestion, achiness.  Cough may take a couple weeks to resolve.  Prescriptions for zithromax (antibiotic) and for triamcinolone nasal spray (for severe congestion) were sent to the pharmacy.  Push fluids and rest.  Recheck or followup with your primary care provider for new fever >100.5, increasing phlegm production/nasal discharge, or if not starting to improve in a few days.

## 2017-08-17 NOTE — ED Provider Notes (Signed)
[facility]    CSN: [id number] Arrival date & time: 08/15/17  1111     History   Chief Complaint Chief Complaint  Patient presents with  . Generalized Body Aches    HPI [name] is a 77 y.o. female. presents with 3d hx achiness, cough, headache.  Sore throat, runny/congested nose. Some loose stools.  Hx HTN, a fib/on blood thinner, vertigo (with vomiting/diarrhea in past).      HPI  Past Medical History:  Diagnosis Date  . History of stress test    a. 02/2013 Nl stress test.  . Hypertension   . Insomnia, unspecified   . Mitral regurgitation    a. echo 01/2016: nl LV sys fxn, mild MR, w/o pulm htn, nl atrial size  . Osteoporosis, unspecified   . Paroxysmal atrial fibrillation ([place]) 01/2016   a. diagnosed 01/2016; b. has been on eliquis, pradaxa, and xarelto at varying times 2/2 cost of each medication-->currently on eliquis (11/2016); c. CHADS2VASc --> 3 (HTN, age x 1, female).  . Pure hypercholesterolemia   . Unspecified asthma(493.90)   . Unspecified transient cerebral ischemia   . Vertigo     Patient Active Problem List   Diagnosis Date Noted  . History of cataract surgery, right 09/01/2016  . Paroxysmal atrial fibrillation ([place]) 02/24/2016  . Vertigo 02/24/2016  . Low HDL (under 40) 02/17/2016  . Dizziness 02/15/2016  . Hx of colonic polyps   . Benign neoplasm of sigmoid colon   . Asthma, mild intermittent 02/21/2015  . Hyperglycemia 02/21/2015  . Essential hypertension 02/20/2015  . Inconclusive mammogram due to dense breasts 01/02/2015  . Dense breast 01/02/2015  . Abnormal EKG 02/23/2013    Past Surgical History:  Procedure Laterality Date  . ABDOMINAL HYSTERECTOMY    . CATARACT EXTRACTION Right 08/31/2016  . COLONOSCOPY WITH PROPOFOL N/A 03/04/2015   Procedure: COLONOSCOPY WITH PROPOFOL;  Surgeon: Lucilla Lame, MD;  Location: [facility] ENDOSCOPY;  Service: Endoscopy;  Laterality: N/A;     Home Medications    Prior to Admission  medications   Medication Sig Start Date End Date Taking? Authorizing Provider  amLODipine (NORVASC) 5 MG tablet TAKE 1 TABLET(2.5 MG) BY MOUTH DAILY 05/11/17  Yes Sowles, Drue Stager, MD  ELIQUIS 5 MG TABS tablet TAKE 1 TABLET(5 MG) BY MOUTH TWICE DAILY 08/04/17  Yes Dunn, Areta Haber, PA-C  meclizine (ANTIVERT) 25 MG tablet TAKE 1 TABLET(25 MG) BY MOUTH EVERY 8 HOURS AS NEEDED 06/22/17  Yes Hubbard Hartshorn, FNP  azithromycin (ZITHROMAX Z-PAK) 250 MG tablet 2 tabs on 1st day, then 1 tab qday x 4 more days. 08/15/17   Wynona Luna, MD  cloNIDine (CATAPRES) 0.1 MG tablet Take 1 tablet (0.1 mg total) by mouth 2 (two) times daily. bp is above 160/90 05/23/17   Steele Sizer, MD  diltiazem (CARDIZEM) 30 MG tablet Take 1 tablet (30 mg total) by mouth every 6 (six) hours as needed (for tachycardia/recurrent Afib.). 02/16/16   Theora Gianotti, NP  triamcinolone (NASACORT) 55 MCG/ACT AERO nasal inhaler Place 2 sprays into the nose daily. 08/15/17   Wynona Luna, MD    Family History Family History  Problem Relation Age of Onset  . COPD Father   . Hypertension Mother   . Hypertension Brother   . Cancer Brother     Social History Social History   Tobacco Use  . Smoking status: Never Smoker  . Smokeless tobacco: Never Used  Substance Use Topics  . Alcohol use: No  Alcohol/week: 0.0 oz  . Drug use: No     Allergies   Ace inhibitors and Penicillins   Review of Systems Review of Systems  All other systems reviewed and are negative.    Physical Exam Triage Vital Signs ED Triage Vitals  Enc Vitals Group     BP 08/15/17 1222 (!) 144/82     Pulse Rate 08/15/17 1222 66     Resp 08/15/17 1222 16     Temp 08/15/17 1222 98.4 F (36.9 C)     Temp Source 08/15/17 1222 Oral     SpO2 08/15/17 1222 98 %     Weight 08/15/17 1221 178 lb (80.7 kg)     Height 08/15/17 1221 5\' 3"  (1.6 m)     Pain Score 08/15/17 1221 7     Pain Loc --    Updated Vital Signs BP (!) 144/82 (BP  Location: Right Arm)   Pulse 66   Temp 98.4 F (36.9 C) (Oral)   Resp 16   Ht 5\' 3"  (1.6 m)   Wt 178 lb (80.7 kg)   SpO2 98%   BMI 31.53 kg/m   Physical Exam  Constitutional: She is oriented to person, place, and time. No distress.  HENT:  Head: Atraumatic.  B TMs dull, with erythma Moderately severe nasal congestion Throat is injected  Eyes:  Conjugate gaze observed, no eye redness/discharge  Neck: Neck supple.  Cardiovascular: Normal rate and regular rhythm.  Pulmonary/Chest: No respiratory distress. She has no wheezes. She has no rales.  Lungs clear, symmetric breath sounds   Abdominal: She exhibits no distension.  Musculoskeletal: Normal range of motion.  Neurological: She is alert and oriented to person, place, and time.  Skin: Skin is warm and dry.  Nursing note and vitals reviewed.    UC Treatments / Results   Procedures Procedures (including critical care time) None today  Final Clinical Impressions(s) / UC Diagnoses   Final diagnoses:  Acute bilateral otitis media  Sinus congestion   Anticipate gradual improvement over the next several days in well being, congestion, achiness.  Cough may take a couple weeks to resolve.  Prescriptions for zithromax (antibiotic) and for triamcinolone nasal spray (for severe congestion) were sent to the pharmacy.  Push fluids and rest.  Recheck or followup with your primary care provider for new fever >100.5, increasing phlegm production/nasal discharge, or if not starting to improve in a few days.     ED Discharge Orders        Ordered    azithromycin (ZITHROMAX Z-PAK) 250 MG tablet     08/15/17 1235    triamcinolone (NASACORT) 55 MCG/ACT AERO nasal inhaler  Daily     08/15/17 1235        Wynona Luna, MD 08/18/17 [phone]

## 2017-09-27 ENCOUNTER — Other Ambulatory Visit: Payer: Self-pay | Admitting: Family Medicine

## 2017-09-27 DIAGNOSIS — I10 Essential (primary) hypertension: Secondary | ICD-10-CM

## 2017-09-27 NOTE — Telephone Encounter (Signed)
Refill request for Hypertension medication:  Amlodipine 5 mg  Last office visit pertaining to hypertension: 05/23/2017  BP Readings from Last 3 Encounters:  08/15/17 (!) 144/82  05/23/17 (!) 148/78  05/11/17 (!) 158/88     Lab Results  Component Value Date   CREATININE 1.03 (H) 01/03/2017   BUN 19 01/03/2017   NA 137 01/03/2017   K 5.0 01/03/2017   CL 104 01/03/2017   CO2 24 01/03/2017    Follow-ups on file. 10/17/2017

## 2017-10-17 ENCOUNTER — Encounter: Payer: Self-pay | Admitting: Family Medicine

## 2017-10-17 ENCOUNTER — Ambulatory Visit (INDEPENDENT_AMBULATORY_CARE_PROVIDER_SITE_OTHER): Payer: Medicare Other | Admitting: Family Medicine

## 2017-10-17 VITALS — BP 140/90 | HR 55 | Resp 16 | Ht 63.0 in | Wt 181.1 lb

## 2017-10-17 DIAGNOSIS — R7303 Prediabetes: Secondary | ICD-10-CM

## 2017-10-17 DIAGNOSIS — Z23 Encounter for immunization: Secondary | ICD-10-CM | POA: Diagnosis not present

## 2017-10-17 DIAGNOSIS — M5412 Radiculopathy, cervical region: Secondary | ICD-10-CM | POA: Diagnosis not present

## 2017-10-17 DIAGNOSIS — E785 Hyperlipidemia, unspecified: Secondary | ICD-10-CM

## 2017-10-17 DIAGNOSIS — I10 Essential (primary) hypertension: Secondary | ICD-10-CM | POA: Diagnosis not present

## 2017-10-17 DIAGNOSIS — I480 Paroxysmal atrial fibrillation: Secondary | ICD-10-CM

## 2017-10-17 DIAGNOSIS — K219 Gastro-esophageal reflux disease without esophagitis: Secondary | ICD-10-CM

## 2017-10-17 DIAGNOSIS — Z1211 Encounter for screening for malignant neoplasm of colon: Secondary | ICD-10-CM | POA: Diagnosis not present

## 2017-10-17 DIAGNOSIS — J4520 Mild intermittent asthma, uncomplicated: Secondary | ICD-10-CM | POA: Diagnosis not present

## 2017-10-17 DIAGNOSIS — R739 Hyperglycemia, unspecified: Secondary | ICD-10-CM

## 2017-10-17 LAB — COMPLETE METABOLIC PANEL WITHOUT GFR
AG Ratio: 1.2 (calc) (ref 1.0–2.5)
ALT: 18 U/L (ref 6–29)
AST: 18 U/L (ref 10–35)
Albumin: 4.3 g/dL (ref 3.6–5.1)
Alkaline phosphatase (APISO): 72 U/L (ref 33–130)
BUN: 17 mg/dL (ref 7–25)
CO2: 29 mmol/L (ref 20–32)
Calcium: 9.6 mg/dL (ref 8.6–10.4)
Chloride: 105 mmol/L (ref 98–110)
Creat: 0.93 mg/dL (ref 0.60–0.93)
GFR, Est African American: 70 mL/min/{1.73_m2}
GFR, Est Non African American: 61 mL/min/{1.73_m2}
Globulin: 3.6 g/dL (ref 1.9–3.7)
Glucose, Bld: 92 mg/dL (ref 65–99)
Potassium: 4.6 mmol/L (ref 3.5–5.3)
Sodium: 140 mmol/L (ref 135–146)
Total Bilirubin: 0.4 mg/dL (ref 0.2–1.2)
Total Protein: 7.9 g/dL (ref 6.1–8.1)

## 2017-10-17 LAB — CBC WITH DIFFERENTIAL/PLATELET
Basophils Absolute: 39 {cells}/uL (ref 0–200)
Basophils Relative: 0.6 %
Eosinophils Absolute: 150 {cells}/uL (ref 15–500)
Eosinophils Relative: 2.3 %
HCT: 41.8 % (ref 35.0–45.0)
Hemoglobin: 14.4 g/dL (ref 11.7–15.5)
Lymphs Abs: 1788 {cells}/uL (ref 850–3900)
MCH: 30.6 pg (ref 27.0–33.0)
MCHC: 34.4 g/dL (ref 32.0–36.0)
MCV: 88.7 fL (ref 80.0–100.0)
MPV: 11 fL (ref 7.5–12.5)
Monocytes Relative: 7.7 %
Neutro Abs: 4024 {cells}/uL (ref 1500–7800)
Neutrophils Relative %: 61.9 %
Platelets: 367 10*3/uL (ref 140–400)
RBC: 4.71 Million/uL (ref 3.80–5.10)
RDW: 12.5 % (ref 11.0–15.0)
Total Lymphocyte: 27.5 %
WBC mixed population: 501 {cells}/uL (ref 200–950)
WBC: 6.5 10*3/uL (ref 3.8–10.8)

## 2017-10-17 LAB — LIPID PANEL
Cholesterol: 192 mg/dL
HDL: 40 mg/dL — ABNORMAL LOW
LDL Cholesterol (Calc): 134 mg/dL — ABNORMAL HIGH
Non-HDL Cholesterol (Calc): 152 mg/dL — ABNORMAL HIGH
Total CHOL/HDL Ratio: 4.8 (calc)
Triglycerides: 82 mg/dL

## 2017-10-17 MED ORDER — TIZANIDINE HCL 2 MG PO TABS: 2.0000 mg | ORAL_TABLET | Freq: Every day | ORAL | 0 refills | Status: DC

## 2017-10-17 MED ORDER — PREDNISONE 10 MG (48) PO TBPK: ORAL_TABLET | ORAL | 0 refills | Status: DC

## 2017-10-17 NOTE — Progress Notes (Signed)
Name: [name]   MRN: [id number]    DOB: October 16, 1940   Date:10/17/2017       Progress Note  Subjective  Chief Complaint  Chief Complaint  Patient presents with  . Hypertension  . Prediabetes  . Shoulder Pain    soreness in right shoulder possibly from arthritis.    HPI  HTN:she is now on norvasc, clonidine prn, and denies chest pain or recent episodes of palpitation. Feeling well  Morbid obesity: based on co-morbidities, she states she does not eat many sweets, but eats large portions and likes to eat, she will try to have better portion control and also start walking daily 30 minutes   Pre-diabetes: hgb A1C was 6.4% and fasting insulin of 35.7, Feb 2018., today hgbA1C is down to 5.8%. Denies polyphagia, polydipsia or polyuria   Vertigo: she has vertigo with head movement, motion sickness, and when she has an episode she needs to go to [facility] by EMS because of severe nausea, vomiting and diarrhea 06/2016. She has seen ENT but no recent visits. She went back to [facility] 09/2016, taking Meclizine prn and it helps with symptoms  Paroxysmal Afib: sees Dr. Fletcher Anon, symptoms started August 2017, she is onEliquisand prn Cardizem. No chest pain very seldom has fluttering sensation in her chest very seldom now. No side effects of medication  Asthma: she states she was given Symbicort in the past, she has intermittent wheezing, that is present when she is on right lateral decubitus ( she has a history of GERD, and likely the trigger ), she has been off medication, discussed rescue inhaler, but she states she does not want one at this time  Neck pain: she noticed that she has upper shoulder, neck pain , going on for the past 2-3 months,worse at night, at times has a painful sensation inside the right 5th finger. No weakness, but has pain with rom of right shoulder and the arm feels sore. She states neck massage seems to help.   Dyslipidemia: low HDL, but it has improved, LDl was elevated, we  will recheck labs   Patient Active Problem List   Diagnosis Date Noted  . History of cataract surgery, right 09/01/2016  . Paroxysmal atrial fibrillation ([place]) 02/24/2016  . Vertigo 02/24/2016  . Low HDL (under 40) 02/17/2016  . Dizziness 02/15/2016  . Hx of colonic polyps   . Benign neoplasm of sigmoid colon   . Asthma, mild intermittent 02/21/2015  . Hyperglycemia 02/21/2015  . Essential hypertension 02/20/2015  . Inconclusive mammogram due to dense breasts 01/02/2015  . Dense breast 01/02/2015  . Abnormal EKG 02/23/2013    Past Surgical History:  Procedure Laterality Date  . ABDOMINAL HYSTERECTOMY    . CATARACT EXTRACTION Right 08/31/2016  . COLONOSCOPY WITH PROPOFOL N/A 03/04/2015   Procedure: COLONOSCOPY WITH PROPOFOL;  Surgeon: Lucilla Lame, MD;  Location: [facility] ENDOSCOPY;  Service: Endoscopy;  Laterality: N/A;    Family History  Problem Relation Age of Onset  . COPD Father   . Hypertension Mother   . Hypertension Brother   . Cancer Brother     Social History   Socioeconomic History  . Marital status: Single    Spouse name: Not on file  . Number of children: Not on file  . Years of education: Not on file  . Highest education level: Not on file  Occupational History  . Not on file  Social Needs  . Financial resource strain: Not on file  . Food insecurity:  Worry: Not on file    Inability: Not on file  . Transportation needs:    Medical: Not on file    Non-medical: Not on file  Tobacco Use  . Smoking status: Never Smoker  . Smokeless tobacco: Never Used  Substance and Sexual Activity  . Alcohol use: No    Alcohol/week: 0.0 oz  . Drug use: No  . Sexual activity: Not Currently  Lifestyle  . Physical activity:    Days per week: Not on file    Minutes per session: Not on file  . Stress: Not on file  Relationships  . Social connections:    Talks on phone: Not on file    Gets together: Not on file    Attends religious service: Not on file    Active  member of club or organization: Not on file    Attends meetings of clubs or organizations: Not on file    Relationship status: Not on file  . Intimate partner violence:    Fear of current or ex partner: Not on file    Emotionally abused: Not on file    Physically abused: Not on file    Forced sexual activity: Not on file  Other Topics Concern  . Not on file  Social History Narrative  . Not on file     Current Outpatient Medications:  .  amLODipine (NORVASC) 5 MG tablet, TAKE 1 TABLET BY MOUTH EVERY DAY, Disp: 90 tablet, Rfl: 1 .  cloNIDine (CATAPRES) 0.1 MG tablet, Take 1 tablet (0.1 mg total) by mouth 2 (two) times daily. bp is above 160/90, Disp: 30 tablet, Rfl: 3 .  diltiazem (CARDIZEM) 30 MG tablet, Take 1 tablet (30 mg total) by mouth every 6 (six) hours as needed (for tachycardia/recurrent Afib.)., Disp: 30 tablet, Rfl: 3 .  ELIQUIS 5 MG TABS tablet, TAKE 1 TABLET(5 MG) BY MOUTH TWICE DAILY, Disp: 60 tablet, Rfl: 3 .  meclizine (ANTIVERT) 25 MG tablet, TAKE 1 TABLET(25 MG) BY MOUTH EVERY 8 HOURS AS NEEDED, Disp: 30 tablet, Rfl: 0 .  triamcinolone (NASACORT) 55 MCG/ACT AERO nasal inhaler, Place 2 sprays into the nose daily., Disp: 1 Inhaler, Rfl: 0 .  predniSONE (STERAPRED UNI-PAK 48 TAB) 10 MG (48) TBPK tablet, Take as directed, Disp: 48 tablet, Rfl: 0 .  tiZANidine (ZANAFLEX) 2 MG tablet, Take 1 tablet (2 mg total) by mouth at bedtime., Disp: 30 tablet, Rfl: 0  Allergies  Allergen Reactions  . Ace Inhibitors Swelling    Patient states her tongue swell.  [name] Penicillins Anxiety    Has patient had a PCN reaction causing immediate rash, facial/tongue/throat swelling, SOB or lightheadedness with hypotension: No Has patient had a PCN reaction causing severe rash involving mucus membranes or skin necrosis: No Has patient had a PCN reaction that required hospitalization No Has patient had a PCN reaction occurring within the last 10 years: No If all of the above answers are "NO", then  may proceed with Cephalosporin use.     ROS  Constitutional: Negative for fever or weight change.  Respiratory: Negative for cough and shortness of breath.   Cardiovascular: Negative for chest pain or palpitations.  Gastrointestinal: Negative for abdominal pain, no bowel changes.  Musculoskeletal: Negative for gait problem or joint swelling.  Skin: Negative for rash.  Neurological: Negative for dizziness or headache.  No other specific complaints in a complete review of systems (except as listed in HPI above).  Objective  Vitals:   10/17/17 0748  BP:  140/90  Pulse: (!) 55  Resp: 16  SpO2: 98%  Weight: 181 lb 1.6 oz (82.1 kg)  Height: 5\' 3"  (1.6 m)    Body mass index is 32.08 kg/m.  Physical Exam  Constitutional: Patient appears well-developed and well-nourished. Obese No distress.  HEENT: head atraumatic, normocephalic, pupils equal and reactive to light,  neck supple, throat within normal limits Cardiovascular: Normal rate, regular rhythm and normal heart sounds.  No murmur heard. No BLE edema. Pulmonary/Chest: Effort normal and breath sounds normal. No respiratory distress. Abdominal: Soft.  There is no tenderness. Psychiatric: Patient has a normal mood and affect. behavior is normal. Judgment and thought content normal.  PHQ2/9: Depression screen [facility] 2/9 01/03/2017 09/01/2016 08/17/2016 01/26/2016 06/24/2015  Decreased Interest 0 0 0 0 0  Down, Depressed, Hopeless 0 0 0 0 0  PHQ - 2 Score 0 0 0 0 0    Fall Risk: Fall Risk  10/17/2017 05/11/2017 01/03/2017 09/01/2016 08/17/2016  Falls in the past year? No No No No No     Functional Status Survey: Is the patient deaf or have difficulty hearing?: No Does the patient have difficulty seeing, even when wearing glasses/contacts?: No Does the patient have difficulty concentrating, remembering, or making decisions?: No Does the patient have difficulty walking or climbing stairs?: No Does the patient have difficulty dressing or  bathing?: No Does the patient have difficulty doing errands alone such as visiting a doctor's office or shopping?: No   Assessment & Plan  1. Paroxysmal atrial fibrillation (HCC)  - tiZANidine (ZANAFLEX) 2 MG tablet; Take 1 tablet (2 mg total) by mouth at bedtime.  Dispense: 30 tablet; Refill: 0  2. Essential hypertension  - COMPLETE METABOLIC PANEL WITH GFR - CBC with Differential/Platelet  3. Pre-diabetes  hgbA1C is stable  4. Gastroesophageal reflux disease, esophagitis presence not specified  Take prednisone with food, avoid nsaid's while taking prednisone  5. Hyperglycemia  Discussed low sugar diet  6. Mild intermittent asthma without complication  Doing well at this time  7. Dyslipidemia (high LDL; low HDL)  - Lipid panel  8. Morbid obesity ([place])  She has a BMI over 30 with more than 2 co-morbidities  9. Screening for colon cancer  - Cologuard  10. Need for vaccination for pneumococcus  - Pneumococcal conjugate vaccine 13-valent IM  11. Cervical radicular pain  Discussed side effects of medication, to take it with food and not before bedtime.  - predniSONE (STERAPRED UNI-PAK 48 TAB) 10 MG (48) TBPK tablet; Take as directed  Dispense: 48 tablet; Refill: 0

## 2017-10-18 ENCOUNTER — Other Ambulatory Visit: Payer: Self-pay

## 2017-11-08 DIAGNOSIS — Z1211 Encounter for screening for malignant neoplasm of colon: Secondary | ICD-10-CM | POA: Diagnosis not present

## 2017-11-08 DIAGNOSIS — Z1212 Encounter for screening for malignant neoplasm of rectum: Secondary | ICD-10-CM | POA: Diagnosis not present

## 2017-11-21 LAB — COLOGUARD: Cologuard: NEGATIVE

## 2017-11-23 ENCOUNTER — Encounter: Payer: Self-pay | Admitting: Family Medicine

## 2017-11-23 ENCOUNTER — Telehealth: Payer: Self-pay

## 2017-11-23 NOTE — Telephone Encounter (Signed)
Patient called.  Unable to reach patient. If patient calls back please inform her of her most recent cologuard results which was negative.

## 2017-12-06 ENCOUNTER — Other Ambulatory Visit: Payer: Self-pay | Admitting: Family Medicine

## 2017-12-06 DIAGNOSIS — Z1231 Encounter for screening mammogram for malignant neoplasm of breast: Secondary | ICD-10-CM

## 2017-12-26 ENCOUNTER — Encounter: Payer: Self-pay | Admitting: Nurse Practitioner

## 2017-12-26 ENCOUNTER — Ambulatory Visit (INDEPENDENT_AMBULATORY_CARE_PROVIDER_SITE_OTHER): Payer: Medicare Other | Admitting: Nurse Practitioner

## 2017-12-26 VITALS — BP 130/82 | HR 66 | Temp 97.8°F | Resp 16 | Ht 63.0 in | Wt 181.5 lb

## 2017-12-26 DIAGNOSIS — M25511 Pain in right shoulder: Secondary | ICD-10-CM

## 2017-12-26 MED ORDER — TIZANIDINE HCL 2 MG PO TABS: 2.0000 mg | ORAL_TABLET | Freq: Every day | ORAL | 0 refills | Status: DC

## 2017-12-26 NOTE — Progress Notes (Addendum)
Name: [name]   MRN: [id number]    DOB: 09-29-1940   Date:12/26/2017       Progress Note  Subjective  Chief Complaint  Chief Complaint  Patient presents with  . Shoulder Pain    right shoulder pain x months. She has been evaluated and treated with Prednisone before. Pain returned shortly after completing Prednisone tapert. She describes the pain as constant soreness. She has a limited ROM and she is not taking anything for pain.    HPI  Pt c/o right shoulder pain ongoing for about 6 months now. Was seen by PCP for same on 4/22 had been going on for 2-3 months at that time. Patient placed on prednisone taper that made it better but states pain has come back. She has a constant soreness and limited range of motion. States pain starts in neck and has been shooting and occasionally causes tingling in fingers- states unchanged from the last 6 months in feeling but happening more often.   No weakness.   Patient Active Problem List   Diagnosis Date Noted  . History of cataract surgery, right 09/01/2016  . Paroxysmal atrial fibrillation ([place]) 02/24/2016  . Vertigo 02/24/2016  . Low HDL (under 40) 02/17/2016  . Dizziness 02/15/2016  . Hx of colonic polyps   . Benign neoplasm of sigmoid colon   . Asthma, mild intermittent 02/21/2015  . Hyperglycemia 02/21/2015  . Essential hypertension 02/20/2015  . Inconclusive mammogram due to dense breasts 01/02/2015  . Dense breast 01/02/2015  . Abnormal EKG 02/23/2013    Past Medical History:  Diagnosis Date  . History of stress test    a. 02/2013 Nl stress test.  . Hypertension   . Insomnia, unspecified   . Mitral regurgitation    a. echo 01/2016: nl LV sys fxn, mild MR, w/o pulm htn, nl atrial size  . Osteoporosis, unspecified   . Paroxysmal atrial fibrillation ([place]) 01/2016   a. diagnosed 01/2016; b. has been on eliquis, pradaxa, and xarelto at varying times 2/2 cost of each medication-->currently on eliquis (11/2016); c. CHADS2VASc -->  3 (HTN, age x 1, female).  . Pure hypercholesterolemia   . Unspecified asthma(493.90)   . Unspecified transient cerebral ischemia   . Vertigo     Past Surgical History:  Procedure Laterality Date  . ABDOMINAL HYSTERECTOMY    . CATARACT EXTRACTION Right 08/31/2016  . COLONOSCOPY WITH PROPOFOL N/A 03/04/2015   Procedure: COLONOSCOPY WITH PROPOFOL;  Surgeon: Lucilla Lame, MD;  Location: [facility] ENDOSCOPY;  Service: Endoscopy;  Laterality: N/A;    Social History   Tobacco Use  . Smoking status: Never Smoker  . Smokeless tobacco: Never Used  Substance Use Topics  . Alcohol use: No    Alcohol/week: 0.0 oz     Current Outpatient Medications:  .  amLODipine (NORVASC) 5 MG tablet, TAKE 1 TABLET BY MOUTH EVERY DAY, Disp: 90 tablet, Rfl: 1 .  cloNIDine (CATAPRES) 0.1 MG tablet, Take 1 tablet (0.1 mg total) by mouth 2 (two) times daily. bp is above 160/90, Disp: 30 tablet, Rfl: 3 .  diltiazem (CARDIZEM) 30 MG tablet, Take 1 tablet (30 mg total) by mouth every 6 (six) hours as needed (for tachycardia/recurrent Afib.)., Disp: 30 tablet, Rfl: 3 .  ELIQUIS 5 MG TABS tablet, TAKE 1 TABLET(5 MG) BY MOUTH TWICE DAILY, Disp: 60 tablet, Rfl: 3 .  meclizine (ANTIVERT) 25 MG tablet, TAKE 1 TABLET(25 MG) BY MOUTH EVERY 8 HOURS AS NEEDED, Disp: 30 tablet, Rfl: 0 .  tiZANidine (ZANAFLEX) 2 MG tablet, Take 1 tablet (2 mg total) by mouth at bedtime., Disp: 30 tablet, Rfl: 0 .  triamcinolone (NASACORT) 55 MCG/ACT AERO nasal inhaler, Place 2 sprays into the nose daily., Disp: 1 Inhaler, Rfl: 0 .  predniSONE (STERAPRED UNI-PAK 48 TAB) 10 MG (48) TBPK tablet, Take as directed (Patient not taking: Reported on 12/26/2017), Disp: 48 tablet, Rfl: 0  Allergies  Allergen Reactions  . Ace Inhibitors Swelling    Patient states her tongue swell.  [name] Penicillins Anxiety    Has patient had a PCN reaction causing immediate rash, facial/tongue/throat swelling, SOB or lightheadedness with hypotension: No Has patient had a PCN  reaction causing severe rash involving mucus membranes or skin necrosis: No Has patient had a PCN reaction that required hospitalization No Has patient had a PCN reaction occurring within the last 10 years: No If all of the above answers are "NO", then may proceed with Cephalosporin use.    ROS    No other specific complaints in a complete review of systems (except as listed in HPI above).  Objective  Vitals:   12/26/17 1019  BP: 130/82  Pulse: 66  Resp: 16  Temp: 97.8 F (36.6 C)  TempSrc: Oral  SpO2: 96%  Weight: 181 lb 8 oz (82.3 kg)  Height: 5\' 3"  (1.6 m)     Body mass index is 32.15 kg/m.  Nursing Note and Vital Signs reviewed.  Physical Exam  Skin:        Constitutional: Patient appears well-developed and well-nourished. Obese  No distress.  Cardiovascular: Normal rate, regular rhythm, S1/S2 present.  No murmur or rub heard.  Pulmonary/Chest: Effort normal and breath sounds clear. No respiratory distress or retractions. MSK: no cervical tenderness or step-off; right shoulder- no obvious deformity, redness, bruising. Limited ROM due to pain,  Psychiatric: Patient has a normal mood and affect. behavior is normal. Judgment and thought content normal.  No results found for this or any previous visit (from the past 72 hour(s)).  Assessment & Plan  1. Right shoulder pain, unspecified chronicity  - Ice 20 minutes at a time, 4 times a day - Acetaminophen ( no more than 3,000mg  a day) for pain  -tizanidine (muscle relaxer) for tightness take it 1 hour before stretching - Heat pack for 20 minutes to shoulder before light stretching exercises below  - tiZANidine (ZANAFLEX) 2 MG tablet; Take 1 tablet (2 mg total) by mouth at bedtime.  Dispense: 30 tablet; Refill: 0 - Ambulatory referral to Orthopedic Surgery - Discussed cervical and shoulder x-rays- pt will follow-up with ortho  Follow up and care instructions discussed and provided in  AVS.  -------------------------------------------- I have reviewed this encounter including the documentation in this note and/or discussed this patient with the provider, Suezanne Cheshire DNP AGNP-C. I am certifying that I agree with the content of this note as supervising physician. Enid Derry, [facility] 12/28/2017, 2:11 PM

## 2017-12-26 NOTE — Patient Instructions (Addendum)
-   Ice 20 minutes at a time, 4 times a day - Acetaminophen ( no more than 3,000mg  a day) for pain  -tizanidine (muscle relaxer) for tightness take it 1 hour before stretching - Heat pack for 20 minutes to shoulder before light stretching exercises below   Shoulder Exercises:   The shoulder joint is the most complex joint in the human body. It is tasked with giving you both the mobility to move your arm 360 degrees, as well as the stability that allows all the bones, muscles, tendons, and ligaments that make up the shoulder to work together. With all of parts that make up the shoulder, it's common to experience pain or loss of mobility due to injury. If you are experiencing shoulder pain, try these exercises to help relieve your pain. Remember to always see a doctor if you experience shoulder pain that is not relieved by several days of rest, ice, massage, and elevation. 1. Arm-across-Chest Stretch - Hold your right hand out in front of you, keeping it near your waist. Reach your left hand behind your elbow, pulling your right arm to the left and across your chest. If you feel pain in your shoulder, lower your arm until the pain subsides. The goal is to be able to pull your right arm across your chest without feeling any pain. Hold for 30-60 seconds then relax and repeat with your left arm. Repeat 3-5 times. 2. Neck Release - Sit up straight then slowly bring your chin toward your chest until you feel the stretch in the back of your neck. Try leaning your head to the left to stretch your right shoulder or leaning your head to the right to stretch your left shoulder. Hold the stretches up to one minute in each direction, breathing deeply as you concentrate on relaxing. Repeat 3-5 times. To progress the stretch, elevate your arm as you pull it across your chest until it is the height of your shoulder. 3. Chest Expansion - Put an exercise band, rope, strap, or even a tie behind your back and grasp it with  both hands. While holding the strap, draw your shoulder blades toward each other and gently lift your chin toward the ceiling. Breathe deeply for 10 to 15 seconds and release. Repeat 3-5 times. To progress the stretch, move your hands closer together on the strap. 4. Seated Twist - Sit straight up in a chair with your knees together. Twist your torso to the right, placing your left hand on the outside of your right thigh. Relax your shoulders as you look towards your right, gentling pushing on your right thigh. Breathe deeply for 10 to 15 seconds and release. Repeat with your left side. Repeat both sides 3-5 times. 5. The 90, 90 Shoulder Stretch - Stand in a doorway, holding your arms up so your elbow is at a 90 degree angle and your arm forms a 90 degree angle to your body at the shoulder. Place each hand on one of the sides of the door frame, placing one foot forward as you stand up straight aligning your neck with your spine. Lean forward as you brace yourself against the door frame. Hold the stretch for 20-30 seconds. Repeat 2-3 times

## 2017-12-28 ENCOUNTER — Ambulatory Visit
Admission: RE | Admit: 2017-12-28 | Discharge: 2017-12-28 | Disposition: A | Discharge status: Home / Self Care | Payer: Medicare Other | Source: Ambulatory Visit | Attending: Family Medicine | Admitting: Family Medicine

## 2017-12-28 DIAGNOSIS — Z1231 Encounter for screening mammogram for malignant neoplasm of breast: Secondary | ICD-10-CM

## 2018-01-02 DIAGNOSIS — M7541 Impingement syndrome of right shoulder: Secondary | ICD-10-CM | POA: Diagnosis not present

## 2018-01-02 DIAGNOSIS — R531 Weakness: Secondary | ICD-10-CM | POA: Diagnosis not present

## 2018-01-02 DIAGNOSIS — M7540 Impingement syndrome of unspecified shoulder: Secondary | ICD-10-CM | POA: Insufficient documentation

## 2018-01-22 ENCOUNTER — Ambulatory Visit (INDEPENDENT_AMBULATORY_CARE_PROVIDER_SITE_OTHER)
Admission: EM | Admit: 2018-01-22 | Discharge: 2018-01-22 | Disposition: A | Discharge status: Other Acute Inpatient Hospital | Payer: Medicare Other | Source: Home / Self Care | Attending: Family Medicine | Admitting: Family Medicine

## 2018-01-22 ENCOUNTER — Observation Stay: Payer: Medicare Other

## 2018-01-22 ENCOUNTER — Encounter: Payer: Self-pay | Admitting: *Deleted

## 2018-01-22 ENCOUNTER — Emergency Department: Payer: Medicare Other

## 2018-01-22 ENCOUNTER — Observation Stay
Admission: EM | Admit: 2018-01-22 | Discharge: 2018-01-23 | Disposition: A | Discharge status: Home / Self Care | Payer: Medicare Other | Attending: Internal Medicine | Admitting: Internal Medicine

## 2018-01-22 ENCOUNTER — Other Ambulatory Visit: Payer: Self-pay

## 2018-01-22 ENCOUNTER — Encounter: Payer: Self-pay | Admitting: Emergency Medicine

## 2018-01-22 DIAGNOSIS — G459 Transient cerebral ischemic attack, unspecified: Secondary | ICD-10-CM

## 2018-01-22 DIAGNOSIS — Z79899 Other long term (current) drug therapy: Secondary | ICD-10-CM | POA: Diagnosis not present

## 2018-01-22 DIAGNOSIS — R040 Epistaxis: Secondary | ICD-10-CM | POA: Diagnosis not present

## 2018-01-22 DIAGNOSIS — R531 Weakness: Secondary | ICD-10-CM | POA: Diagnosis not present

## 2018-01-22 DIAGNOSIS — H538 Other visual disturbances: Secondary | ICD-10-CM | POA: Insufficient documentation

## 2018-01-22 DIAGNOSIS — Z7951 Long term (current) use of inhaled steroids: Secondary | ICD-10-CM | POA: Diagnosis not present

## 2018-01-22 DIAGNOSIS — E785 Hyperlipidemia, unspecified: Secondary | ICD-10-CM | POA: Insufficient documentation

## 2018-01-22 DIAGNOSIS — R202 Paresthesia of skin: Secondary | ICD-10-CM | POA: Diagnosis not present

## 2018-01-22 DIAGNOSIS — I480 Paroxysmal atrial fibrillation: Secondary | ICD-10-CM

## 2018-01-22 DIAGNOSIS — Z8673 Personal history of transient ischemic attack (TIA), and cerebral infarction without residual deficits: Secondary | ICD-10-CM | POA: Insufficient documentation

## 2018-01-22 DIAGNOSIS — I340 Nonrheumatic mitral (valve) insufficiency: Secondary | ICD-10-CM | POA: Insufficient documentation

## 2018-01-22 DIAGNOSIS — I671 Cerebral aneurysm, nonruptured: Secondary | ICD-10-CM | POA: Diagnosis not present

## 2018-01-22 DIAGNOSIS — R51 Headache: Secondary | ICD-10-CM | POA: Diagnosis not present

## 2018-01-22 DIAGNOSIS — Z7901 Long term (current) use of anticoagulants: Secondary | ICD-10-CM | POA: Insufficient documentation

## 2018-01-22 DIAGNOSIS — I639 Cerebral infarction, unspecified: Secondary | ICD-10-CM | POA: Diagnosis not present

## 2018-01-22 DIAGNOSIS — M6281 Muscle weakness (generalized): Secondary | ICD-10-CM | POA: Diagnosis not present

## 2018-01-22 DIAGNOSIS — J4520 Mild intermittent asthma, uncomplicated: Secondary | ICD-10-CM | POA: Insufficient documentation

## 2018-01-22 DIAGNOSIS — Z880 Allergy status to penicillin: Secondary | ICD-10-CM | POA: Insufficient documentation

## 2018-01-22 DIAGNOSIS — R29898 Other symptoms and signs involving the musculoskeletal system: Secondary | ICD-10-CM | POA: Insufficient documentation

## 2018-01-22 DIAGNOSIS — I10 Essential (primary) hypertension: Secondary | ICD-10-CM

## 2018-01-22 DIAGNOSIS — Z7982 Long term (current) use of aspirin: Secondary | ICD-10-CM | POA: Insufficient documentation

## 2018-01-22 LAB — BASIC METABOLIC PANEL WITH GFR
Anion gap: 7 (ref 5–15)
BUN: 23 mg/dL (ref 8–23)
CO2: 26 mmol/L (ref 22–32)
Calcium: 9.4 mg/dL (ref 8.9–10.3)
Chloride: 108 mmol/L (ref 98–111)
Creatinine, Ser: 0.98 mg/dL (ref 0.44–1.00)
GFR calc Af Amer: >60 mL/min
GFR calc non Af Amer: 55 mL/min — ABNORMAL LOW
Glucose, Bld: 103 mg/dL — ABNORMAL HIGH (ref 70–99)
Potassium: 4 mmol/L (ref 3.5–5.1)
Sodium: 141 mmol/L (ref 135–145)

## 2018-01-22 LAB — PROTIME-INR
INR: 0.98
Prothrombin Time: 12.9 s (ref 11.4–15.2)

## 2018-01-22 LAB — URINALYSIS, COMPLETE (UACMP) WITH MICROSCOPIC
Bacteria, UA: NONE SEEN
Bilirubin Urine: NEGATIVE
Glucose, UA: NEGATIVE mg/dL
Hgb urine dipstick: NEGATIVE
Ketones, ur: NEGATIVE mg/dL
Nitrite: NEGATIVE
Protein, ur: NEGATIVE mg/dL
Specific Gravity, Urine: 1.011 (ref 1.005–1.030)
pH: 5 (ref 5.0–8.0)

## 2018-01-22 LAB — HEMOGLOBIN A1C
Hgb A1c MFr Bld: 6.1 % — ABNORMAL HIGH (ref 4.8–5.6)
Mean Plasma Glucose: 128.37 mg/dL

## 2018-01-22 LAB — LIPID PANEL
Cholesterol: 153 mg/dL (ref 0–200)
HDL: 40 mg/dL — ABNORMAL LOW
LDL Cholesterol: 106 mg/dL — ABNORMAL HIGH (ref 0–99)
Total CHOL/HDL Ratio: 3.8 ratio
Triglycerides: 36 mg/dL
VLDL: 7 mg/dL (ref 0–40)

## 2018-01-22 LAB — CBC
HCT: 40.6 % (ref 35.0–47.0)
Hemoglobin: 14 g/dL (ref 12.0–16.0)
MCH: 31.7 pg (ref 26.0–34.0)
MCHC: 34.5 g/dL (ref 32.0–36.0)
MCV: 92.1 fL (ref 80.0–100.0)
Platelets: 266 10*3/uL (ref 150–440)
RBC: 4.41 MIL/uL (ref 3.80–5.20)
RDW: 13.7 % (ref 11.5–14.5)
WBC: 7.5 10*3/uL (ref 3.6–11.0)

## 2018-01-22 LAB — APTT: aPTT: 28 s (ref 24–36)

## 2018-01-22 MED ORDER — TRIAMCINOLONE ACETONIDE 55 MCG/ACT NA AERO
2.0000 | INHALATION_SPRAY | Freq: Every day | NASAL | Status: DC
  Filled 2018-01-22: qty 21.6

## 2018-01-22 MED ORDER — ONDANSETRON HCL 4 MG PO TABS: 4.0000 mg | ORAL_TABLET | Freq: Four times a day (QID) | ORAL | Status: DC | PRN

## 2018-01-22 MED ORDER — ATORVASTATIN CALCIUM 20 MG PO TABS: 40.0000 mg | ORAL_TABLET | Freq: Every day | ORAL | Status: DC

## 2018-01-22 MED ORDER — ASPIRIN 81 MG PO CHEW
324.0000 mg | CHEWABLE_TABLET | Freq: Once | ORAL | Status: AC
  Administered 2018-01-22: 324 mg via ORAL
  Filled 2018-01-22: qty 4

## 2018-01-22 MED ORDER — DILTIAZEM HCL 60 MG PO TABS
30.0000 mg | ORAL_TABLET | Freq: Four times a day (QID) | ORAL | Status: DC | PRN
  Filled 2018-01-22: qty 0.5

## 2018-01-22 MED ORDER — ONDANSETRON HCL 4 MG/2ML IJ SOLN: 4.0000 mg | Freq: Four times a day (QID) | INTRAMUSCULAR | Status: DC | PRN

## 2018-01-22 MED ORDER — MECLIZINE HCL 25 MG PO TABS
25.0000 mg | ORAL_TABLET | Freq: Three times a day (TID) | ORAL | Status: DC | PRN
  Filled 2018-01-22: qty 1

## 2018-01-22 MED ORDER — ASPIRIN 81 MG PO CHEW
81.0000 mg | CHEWABLE_TABLET | Freq: Every day | ORAL | Status: DC
  Administered 2018-01-23: 81 mg via ORAL
  Filled 2018-01-22: qty 1

## 2018-01-22 MED ORDER — AMLODIPINE BESYLATE 5 MG PO TABS
5.0000 mg | ORAL_TABLET | Freq: Every day | ORAL | Status: DC
  Administered 2018-01-22 – 2018-01-23 (×2): 5 mg via ORAL
  Filled 2018-01-22 (×2): qty 1

## 2018-01-22 MED ORDER — CLONIDINE HCL 0.1 MG PO TABS
0.1000 mg | ORAL_TABLET | Freq: Two times a day (BID) | ORAL | Status: DC
  Filled 2018-01-22: qty 1

## 2018-01-22 MED ORDER — ACETAMINOPHEN 325 MG PO TABS: 650.0000 mg | ORAL_TABLET | Freq: Four times a day (QID) | ORAL | Status: DC | PRN

## 2018-01-22 MED ORDER — TIZANIDINE HCL 2 MG PO TABS
2.0000 mg | ORAL_TABLET | Freq: Every day | ORAL | Status: DC
  Filled 2018-01-22 (×2): qty 1

## 2018-01-22 MED ORDER — APIXABAN 5 MG PO TABS
5.0000 mg | ORAL_TABLET | Freq: Two times a day (BID) | ORAL | Status: DC
  Administered 2018-01-22 – 2018-01-23 (×2): 5 mg via ORAL
  Filled 2018-01-22 (×2): qty 1

## 2018-01-22 MED ORDER — ACETAMINOPHEN 650 MG RE SUPP: 650.0000 mg | Freq: Four times a day (QID) | RECTAL | Status: DC | PRN

## 2018-01-22 NOTE — ED Triage Notes (Signed)
Per EMS report, patient came from [facility] with c/o left side weakness that has been present for 3 days and a nose bleed x2 days. Patient's nose bleed resolved before coming to [facility]. Patient states left-sided weakness became more pronounced over the last two days.

## 2018-01-22 NOTE — H&P (Signed)
[facility] NAME: [name]    MR#:  [id number]  DATE OF BIRTH:  [birth year]  DATE OF ADMISSION:  01/22/2018  PRIMARY CARE PHYSICIAN: Steele Sizer, MD   REQUESTING/REFERRING PHYSICIAN: Dr. Delman Kitten  CHIEF COMPLAINT:   Chief Complaint  Patient presents with  . Weakness    HISTORY OF PRESENT ILLNESS:  [name]  is a 77 y.o. female with a known history of paroxysmal atrial fibrillation on eliquis, hypertension, mitral regurgitation presents to hospital secondary to epistaxis and also left-sided weakness. Symptoms started two days ago when patient had an episode of epistaxis from her right nare. Stopped spontaneously. She also felt weakness in her left leg especially when she was walking. No unsteadiness, no ataxia or falls. Since then she has been feeling generalized weakness. Denies any fevers or chills. No nausea vomiting or chest pain. No palpitations. She has been taking her eliquis for paroxysmal atrial fibrillation. Yesterday she noticed that her left hand was numb in the lateral three fingers started cramping up. She also experienced paresthesias in the hand. It improved within 5 to 10 minutes. This morning she started having epistaxis again and so presented to the emergency room. No further bleeding noted here. She did have slight giveaway weakness on the left upper and lower extremities. CT of the head is negative. Being admitted for further evaluation.  PAST MEDICAL HISTORY:   Past Medical History:  Diagnosis Date  . History of stress test    a. 02/2013 Nl stress test.  . Hypertension   . Insomnia, unspecified   . Mitral regurgitation    a. echo 01/2016: nl LV sys fxn, mild MR, w/o pulm htn, nl atrial size  . Osteoporosis, unspecified   . Paroxysmal atrial fibrillation ([place]) 01/2016   a. diagnosed 01/2016; b. has been on eliquis, pradaxa, and xarelto at varying times 2/2 cost of each medication-->currently  on eliquis (11/2016); c. CHADS2VASc --> 3 (HTN, age x 1, female).  . Pure hypercholesterolemia   . Unspecified asthma(493.90)   . Unspecified transient cerebral ischemia   . Vertigo     PAST SURGICAL HISTORY:   Past Surgical History:  Procedure Laterality Date  . ABDOMINAL HYSTERECTOMY    . CATARACT EXTRACTION Right 08/31/2016  . COLONOSCOPY WITH PROPOFOL N/A 03/04/2015   Procedure: COLONOSCOPY WITH PROPOFOL;  Surgeon: Lucilla Lame, MD;  Location: [facility] ENDOSCOPY;  Service: Endoscopy;  Laterality: N/A;    SOCIAL HISTORY:   Social History   Tobacco Use  . Smoking status: Never Smoker  . Smokeless tobacco: Never Used  Substance Use Topics  . Alcohol use: No    Alcohol/week: 0.0 oz    FAMILY HISTORY:   Family History  Problem Relation Age of Onset  . COPD Father   . Hypertension Mother   . Aneurysm Mother   . Hypertension Brother   . Cancer Brother     DRUG ALLERGIES:   Allergies  Allergen Reactions  . Ace Inhibitors Swelling    Patient states her tongue swell.  [name] Penicillins Anxiety    Has patient had a PCN reaction causing immediate rash, facial/tongue/throat swelling, SOB or lightheadedness with hypotension: No Has patient had a PCN reaction causing severe rash involving mucus membranes or skin necrosis: No Has patient had a PCN reaction that required hospitalization No Has patient had a PCN reaction occurring within the last 10 years: No If all of the above answers are "NO", then may proceed with  Cephalosporin use.    REVIEW OF SYSTEMS:   Review of Systems  Constitutional: Positive for malaise/fatigue. Negative for chills, fever and weight loss.  HENT: Negative for ear discharge, ear pain, hearing loss, nosebleeds and tinnitus.   Eyes: Negative for blurred vision, double vision and photophobia.  Respiratory: Negative for cough, hemoptysis, shortness of breath and wheezing.   Cardiovascular: Negative for chest pain, palpitations, orthopnea and leg swelling.    Gastrointestinal: Negative for abdominal pain, constipation, diarrhea, heartburn, melena, nausea and vomiting.  Genitourinary: Negative for dysuria, frequency, hematuria and urgency.  Musculoskeletal: Negative for back pain, myalgias and neck pain.  Skin: Negative for rash.  Neurological: Positive for focal weakness, weakness and headaches. Negative for dizziness, tingling, tremors, sensory change and speech change.  Endo/Heme/Allergies: Does not bruise/bleed easily.  Psychiatric/Behavioral: Negative for depression.    MEDICATIONS AT HOME:   Prior to Admission medications   Medication Sig Start Date End Date Taking? Authorizing Provider  amLODipine (NORVASC) 5 MG tablet TAKE 1 TABLET BY MOUTH EVERY DAY 09/27/17   Steele Sizer, MD  cloNIDine (CATAPRES) 0.1 MG tablet Take 1 tablet (0.1 mg total) by mouth 2 (two) times daily. bp is above 160/90 05/23/17   Steele Sizer, MD  diltiazem (CARDIZEM) 30 MG tablet Take 1 tablet (30 mg total) by mouth every 6 (six) hours as needed (for tachycardia/recurrent Afib.). 02/16/16   Theora Gianotti, NP  ELIQUIS 5 MG TABS tablet TAKE 1 TABLET(5 MG) BY MOUTH TWICE DAILY 08/04/17   Rise Mu, PA-C  meclizine (ANTIVERT) 25 MG tablet TAKE 1 TABLET(25 MG) BY MOUTH EVERY 8 HOURS AS NEEDED 06/22/17   Hubbard Hartshorn, FNP  tiZANidine (ZANAFLEX) 2 MG tablet Take 1 tablet (2 mg total) by mouth at bedtime. 12/26/17   Poulose, Bethel Born, NP  triamcinolone (NASACORT) 55 MCG/ACT AERO nasal inhaler Place 2 sprays into the nose daily. 08/15/17   Wynona Luna, MD      VITAL SIGNS:  Blood pressure (!) 151/70, pulse (!) 58, temperature 97.8 F (36.6 C), resp. rate 14, height 5\' 3"  (1.6 m), weight 81.6 kg (180 lb), SpO2 99 %.  PHYSICAL EXAMINATION:   Physical Exam  GENERAL:  77 y.o.-year-old patient lying in the bed with no acute distress.  EYES: Pupils equal, round, reactive to light and accommodation. No scleral icterus. Extraocular muscles intact.   HEENT: Head atraumatic, normocephalic. Oropharynx and nasopharynx clear.  NECK:  Supple, no jugular venous distention. No thyroid enlargement, no tenderness.  LUNGS: Normal breath sounds bilaterally, no wheezing, rales,rhonchi or crepitation. No use of accessory muscles of respiration.  CARDIOVASCULAR: S1, S2 normal. No murmurs, rubs, or gallops.  ABDOMEN: Soft, nontender, nondistended. Bowel sounds present. No organomegaly or mass.  EXTREMITIES: No pedal edema, cyanosis, or clubbing.NEUROLOGIC: Cranial nerves II through XII are intact. No nystagmus, no facial deficits  Muscle strength 5/5 in all extremities but slight give away weakness on left leg. Sensation intact. Gait not checked.  PSYCHIATRIC: The patient is alert and oriented x 3.  SKIN: No obvious rash, lesion, or ulcer.   LABORATORY PANEL:   CBC Recent Labs  Lab 01/22/18 1154  WBC 7.5  HGB 14.0  HCT 40.6  PLT 266   ------------------------------------------------------------------------------------------------------------------  Chemistries  Recent Labs  Lab 01/22/18 1154  NA 141  K 4.0  CL 108  CO2 26  GLUCOSE 103*  BUN 23  CREATININE 0.98  CALCIUM 9.4   ------------------------------------------------------------------------------------------------------------------  Cardiac Enzymes No results for input(s): TROPONINI in the last  168 hours. ------------------------------------------------------------------------------------------------------------------  RADIOLOGY:  Ct Head Wo Contrast  Result Date: 01/22/2018 CLINICAL DATA:  77 year old female with acute headache and LEFT-sided weakness for 1 day. EXAM: CT HEAD WITHOUT CONTRAST TECHNIQUE: Contiguous axial images were obtained from the base of the skull through the vertex without intravenous contrast. COMPARISON:  10/04/2016 and prior CTs FINDINGS: Brain: No evidence of acute infarction, hemorrhage, hydrocephalus, extra-axial collection or mass lesion/mass  effect. Mild chronic small-vessel white matter ischemic changes again noted. Vascular: Mild carotid atherosclerotic calcifications noted. Skull: Normal. Negative for fracture or focal lesion. Sinuses/Orbits: No acute finding. Other: None. IMPRESSION: 1. No evidence of acute intracranial abnormality 2. Mild chronic small-vessel white matter ischemic changes. Electronically Signed   By: Margarette Canada M.D.   On: 01/22/2018 13:30    EKG:   Orders placed or performed during the hospital encounter of 01/22/18  . ED EKG  . ED EKG  . EKG 12-Lead  . EKG 12-Lead    IMPRESSION AND PLAN:   [name]  is a 77 y.o. female with a known history of paroxysmal atrial fibrillation on eliquis, hypertension, mitral regurgitation presents to hospital secondary to epistaxis and also left-sided weakness.  1. Left-sided weakness-on and off weakness in the last 48 hours, nor a candidate for TPA. Symptoms have almost resolved. -Admit, MRI of the brain and MRA. Neuro- checks and neurology consult -carotid Doppler and echocardiogram ordered -already on eliquis for a fib. EKG with sinus rhythm. -Started on low-dose aspirin, added statin. Check lipid panel -PT/OT and speech consults. -If MRI is negative, symptoms persisting-consider MRI C-spine -check urine analysis  2. Paroxysmal atrial fibrillation-rate controlled -continue eliquis. On Cardizem PRN  3. Hypertension-continue Norvasc, clonidine  4. DVT prophylaxis-already on eliquis   All the records are reviewed and case discussed with ED provider. Management plans discussed with the patient, family and they are in agreement.  CODE STATUS: Full Code  TOTAL TIME TAKING CARE OF THIS PATIENT: 50 minutes.    Gladstone Lighter M.D on 01/22/2018 at 2:30 PM  Between 7am to 6pm - Pager - [phone]  After 6pm go to [email or web address] - password [email or web address] [facility]  Office  [phone]  CC: Primary care physician; Steele Sizer,  MD

## 2018-01-22 NOTE — ED Provider Notes (Signed)
[facility]    CSN: [id number] Arrival date & time: 01/22/18  7408     History   Chief Complaint Chief Complaint  Patient presents with  . Numbness  . Weakness  . Epistaxis    HPI [name] is a 77 y.o. female.   77 yo female with a h/o paroxysmal atrial fibrillation, mitral regurgitation, prior TIA, currently on Eliquis, presents with a c/o left arm numbness/tingling  and left leg weakness since yesterday but worse today. Also states she noticed some blurred vision yesterday and sensation of "my lip feels swollen". Denies any chest pains or shortness of breath.   The history is provided by the patient.  Weakness  Primary symptoms include focal weakness.  Epistaxis    Past Medical History:  Diagnosis Date  . History of stress test    a. 02/2013 Nl stress test.  . Hypertension   . Insomnia, unspecified   . Mitral regurgitation    a. echo 01/2016: nl LV sys fxn, mild MR, w/o pulm htn, nl atrial size  . Osteoporosis, unspecified   . Paroxysmal atrial fibrillation ([place]) 01/2016   a. diagnosed 01/2016; b. has been on eliquis, pradaxa, and xarelto at varying times 2/2 cost of each medication-->currently on eliquis (11/2016); c. CHADS2VASc --> 3 (HTN, age x 1, female).  . Pure hypercholesterolemia   . Unspecified asthma(493.90)   . Unspecified transient cerebral ischemia   . Vertigo     Patient Active Problem List   Diagnosis Date Noted  . History of cataract surgery, right 09/01/2016  . Paroxysmal atrial fibrillation ([place]) 02/24/2016  . Vertigo 02/24/2016  . Low HDL (under 40) 02/17/2016  . Dizziness 02/15/2016  . Hx of colonic polyps   . Benign neoplasm of sigmoid colon   . Asthma, mild intermittent 02/21/2015  . Hyperglycemia 02/21/2015  . Essential hypertension 02/20/2015  . Inconclusive mammogram due to dense breasts 01/02/2015  . Dense breast 01/02/2015  . Abnormal EKG 02/23/2013    Past Surgical History:  Procedure Laterality Date  .  ABDOMINAL HYSTERECTOMY    . CATARACT EXTRACTION Right 08/31/2016  . COLONOSCOPY WITH PROPOFOL N/A 03/04/2015   Procedure: COLONOSCOPY WITH PROPOFOL;  Surgeon: Lucilla Lame, MD;  Location: [facility] ENDOSCOPY;  Service: Endoscopy;  Laterality: N/A;    OB History   None      Home Medications    Prior to Admission medications   Medication Sig Start Date End Date Taking? Authorizing Provider  amLODipine (NORVASC) 5 MG tablet TAKE 1 TABLET BY MOUTH EVERY DAY 09/27/17  Yes Sowles, Drue Stager, MD  cloNIDine (CATAPRES) 0.1 MG tablet Take 1 tablet (0.1 mg total) by mouth 2 (two) times daily. bp is above 160/90 05/23/17  Yes Sowles, Drue Stager, MD  diltiazem (CARDIZEM) 30 MG tablet Take 1 tablet (30 mg total) by mouth every 6 (six) hours as needed (for tachycardia/recurrent Afib.). 02/16/16  Yes Theora Gianotti, NP  ELIQUIS 5 MG TABS tablet TAKE 1 TABLET(5 MG) BY MOUTH TWICE DAILY 08/04/17  Yes Dunn, Areta Haber, PA-C  meclizine (ANTIVERT) 25 MG tablet TAKE 1 TABLET(25 MG) BY MOUTH EVERY 8 HOURS AS NEEDED 06/22/17  Yes Hubbard Hartshorn, FNP  tiZANidine (ZANAFLEX) 2 MG tablet Take 1 tablet (2 mg total) by mouth at bedtime. 12/26/17  Yes Poulose, Bethel Born, NP  triamcinolone (NASACORT) 55 MCG/ACT AERO nasal inhaler Place 2 sprays into the nose daily. 08/15/17   Wynona Luna, MD    Family History Family History  Problem Relation  Age of Onset  . COPD Father   . Hypertension Mother   . Hypertension Brother   . Cancer Brother     Social History Social History   Tobacco Use  . Smoking status: Never Smoker  . Smokeless tobacco: Never Used  Substance Use Topics  . Alcohol use: No    Alcohol/week: 0.0 oz  . Drug use: No     Allergies   Ace inhibitors and Penicillins   Review of Systems Review of Systems  HENT: Positive for nosebleeds.   Neurological: Positive for focal weakness and weakness.     Physical Exam Triage Vital Signs ED Triage Vitals  Enc Vitals Group     BP 01/22/18 1006  (!) 169/86     Pulse Rate 01/22/18 1006 63     Resp 01/22/18 1006 18     Temp 01/22/18 1006 98 F (36.7 C)     Temp Source 01/22/18 1006 Oral     SpO2 01/22/18 1006 97 %     Weight 01/22/18 1004 180 lb (81.6 kg)     Height 01/22/18 1004 5\' 3"  (1.6 m)     Head Circumference --      Peak Flow --      Pain Score 01/22/18 1004 0     Pain Loc --      Pain Edu? --      Excl. in [place]? --    No data found.  Updated Vital Signs BP (!) 169/86 (BP Location: Right Arm)   Pulse 63   Temp 98 F (36.7 C) (Oral)   Resp 18   Ht 5\' 3"  (1.6 m)   Wt 180 lb (81.6 kg)   SpO2 97%   BMI 31.89 kg/m   Visual Acuity Right Eye Distance:   Left Eye Distance:   Bilateral Distance:    Right Eye Near:   Left Eye Near:    Bilateral Near:     Physical Exam  Constitutional: She is oriented to person, place, and time. She appears well-developed and well-nourished. No distress.  HENT:  Head: Normocephalic and atraumatic.  Eyes: Pupils are equal, round, and reactive to light. EOM are normal.  Neck: Normal range of motion. Neck supple.  Neurological: She is alert and oriented to person, place, and time. She displays normal reflexes. No cranial nerve deficit. Coordination normal.  Mild weakness of upper and lower extremities noted; subjective decreased skin sensation on left upper and lower extremities  Skin: She is not diaphoretic.  Nursing note and vitals reviewed.    UC Treatments / Results  Labs (all labs ordered are listed, but only abnormal results are displayed) Labs Reviewed - No data to display  EKG None  Radiology No results found.  Procedures ED EKG Date/Time: 01/22/2018 10:40 AM Performed by: Norval Gable, MD Authorized by: Norval Gable, MD   ECG reviewed by ED Physician in the absence of a cardiologist: yes   Previous ECG:    Previous ECG:  Compared to current   Similarity:  No change Interpretation:    Interpretation: normal   Rate:    ECG rate assessment: normal     Rhythm:    Rhythm: sinus rhythm   Ectopy:    Ectopy: none   QRS:    QRS axis:  Normal Conduction:    Conduction: normal   ST segments:    ST segments:  Normal T waves:    T waves: normal     (including critical care time)  Medications  Ordered in UC Medications - No data to display  Initial Impression / Assessment and Plan / UC Course  I have reviewed the triage vital signs and the nursing notes.  Pertinent labs & imaging results that were available during my care of the patient were reviewed by me and considered in my medical decision making (see chart for details).      Final Clinical Impressions(s) / UC Diagnoses   Final diagnoses:  Acute left-sided weakness  Paresthesia  Essential hypertension  TIA (transient ischemic attack)  Paroxysmal A-fib (HCC)  Epistaxis   Discharge Instructions   None    ED Prescriptions    None     1. diagnoses reviewed with patient; due to patient's high risk medical history and current acute/subacute symptoms recommend patient go to Emergency Department by EMS for further evaluation and management. Report called to charge RN at [facility] ED   Controlled Substance Prescriptions [place] Controlled Substance Registry consulted? Not Applicable   Norval Gable, MD 01/22/18 1047

## 2018-01-22 NOTE — ED Notes (Signed)
Patient left for ultrasound.

## 2018-01-22 NOTE — ED Provider Notes (Signed)
[facility] Emergency Department Provider Note   ____________________________________________   First MD Initiated Contact with Patient 01/22/18 1151     (approximate)  I have reviewed the triage vital signs and the nursing notes.   HISTORY  Chief Complaint Weakness    HPI [name] is a 77 y.o. female history of previous TIA, A. fib on Eliquis.  Patient reports that for about 2 days now she is felt a little bit of a weak feeling on her left side of her body.  She also reports yesterday at about 4 PM she had an episode where her left arm did not seem to want to work, she could get her hand to move it stayed numb and tingly, and it has gotten better but she still feels weak in the hand and left arm and some decrease in sensation on the left face left arm and left leg since 4 PM yesterday.  Took her Eliquis dose this morning.  No nausea vomiting.  No fevers or chills.  She is a slight feeling of a light headache on the right side for about 2 days.  And she did have a small nosebleed earlier today that is now resolved.  No injury or trauma.  No bleeding from gums or easy bruising.  She reports overall her weakness is improved, but still having some slight weakness in the left hand and left arm.  No changes in vision.  No confusion.  No speech changes.  No weakness in her face.  Past Medical History:  Diagnosis Date  . History of stress test    a. 02/2013 Nl stress test.  . Hypertension   . Insomnia, unspecified   . Mitral regurgitation    a. echo 01/2016: nl LV sys fxn, mild MR, w/o pulm htn, nl atrial size  . Osteoporosis, unspecified   . Paroxysmal atrial fibrillation ([place]) 01/2016   a. diagnosed 01/2016; b. has been on eliquis, pradaxa, and xarelto at varying times 2/2 cost of each medication-->currently on eliquis (11/2016); c. CHADS2VASc --> 3 (HTN, age x 1, female).  . Pure hypercholesterolemia   . Unspecified asthma(493.90)   . Unspecified  transient cerebral ischemia   . Vertigo     Patient Active Problem List   Diagnosis Date Noted  . History of cataract surgery, right 09/01/2016  . Paroxysmal atrial fibrillation ([place]) 02/24/2016  . Vertigo 02/24/2016  . Low HDL (under 40) 02/17/2016  . Dizziness 02/15/2016  . Hx of colonic polyps   . Benign neoplasm of sigmoid colon   . Asthma, mild intermittent 02/21/2015  . Hyperglycemia 02/21/2015  . Essential hypertension 02/20/2015  . Inconclusive mammogram due to dense breasts 01/02/2015  . Dense breast 01/02/2015  . Abnormal EKG 02/23/2013    Past Surgical History:  Procedure Laterality Date  . ABDOMINAL HYSTERECTOMY    . CATARACT EXTRACTION Right 08/31/2016  . COLONOSCOPY WITH PROPOFOL N/A 03/04/2015   Procedure: COLONOSCOPY WITH PROPOFOL;  Surgeon: Lucilla Lame, MD;  Location: [facility] ENDOSCOPY;  Service: Endoscopy;  Laterality: N/A;    Prior to Admission medications   Medication Sig Start Date End Date Taking? Authorizing Provider  amLODipine (NORVASC) 5 MG tablet TAKE 1 TABLET BY MOUTH EVERY DAY 09/27/17   Steele Sizer, MD  cloNIDine (CATAPRES) 0.1 MG tablet Take 1 tablet (0.1 mg total) by mouth 2 (two) times daily. bp is above 160/90 05/23/17   Steele Sizer, MD  diltiazem (CARDIZEM) 30 MG tablet Take 1 tablet (30 mg total) by  mouth every 6 (six) hours as needed (for tachycardia/recurrent Afib.). 02/16/16   Theora Gianotti, NP  ELIQUIS 5 MG TABS tablet TAKE 1 TABLET(5 MG) BY MOUTH TWICE DAILY 08/04/17   Rise Mu, PA-C  meclizine (ANTIVERT) 25 MG tablet TAKE 1 TABLET(25 MG) BY MOUTH EVERY 8 HOURS AS NEEDED 06/22/17   Hubbard Hartshorn, FNP  tiZANidine (ZANAFLEX) 2 MG tablet Take 1 tablet (2 mg total) by mouth at bedtime. 12/26/17   Poulose, Bethel Born, NP  triamcinolone (NASACORT) 55 MCG/ACT AERO nasal inhaler Place 2 sprays into the nose daily. 08/15/17   Wynona Luna, MD    Allergies Ace inhibitors and Penicillins  Family History  Problem Relation  Age of Onset  . COPD Father   . Hypertension Mother   . Hypertension Brother   . Cancer Brother     Social History Social History   Tobacco Use  . Smoking status: Never Smoker  . Smokeless tobacco: Never Used  Substance Use Topics  . Alcohol use: No    Alcohol/week: 0.0 oz  . Drug use: No    Review of Systems Constitutional: No fever/chills Eyes: No visual changes. ENT: No sore throat. Cardiovascular: Denies chest pain. Respiratory: Denies shortness of breath. Gastrointestinal: No abdominal pain.  No nausea, no vomiting.  No diarrhea.  No constipation. Genitourinary: Negative for dysuria. Musculoskeletal: Negative for back pain. Skin: Negative for rash. Neurological: Negative for headaches, focal weakness or numbness.    ____________________________________________   PHYSICAL EXAM:  VITAL SIGNS: ED Triage Vitals  Enc Vitals Group     BP 01/22/18 1125 (!) 160/70     Pulse Rate 01/22/18 1125 (!) 58     Resp 01/22/18 1125 18     Temp 01/22/18 1125 97.8 F (36.6 C)     Temp Source 01/22/18 1125 Oral     SpO2 01/22/18 1118 98 %     Weight 01/22/18 1133 180 lb (81.6 kg)     Height 01/22/18 1133 5\' 3"  (1.6 m)     Head Circumference --      Peak Flow --      Pain Score 01/22/18 1132 4     Pain Loc --      Pain Edu? --      Excl. in [place]? --     Constitutional: Alert and oriented. Well appearing and in no acute distress. Eyes: Conjunctivae are normal. Head: Atraumatic. Nose: No congestion/rhinnorhea. Mouth/Throat: Mucous membranes are moist. Neck: No stridor.   Cardiovascular: Normal rate, regular rhythm. Grossly normal heart sounds.  Good peripheral circulation. Respiratory: Normal respiratory effort.  No retractions. Lungs CTAB. Gastrointestinal: Soft and nontender. No distention. Musculoskeletal: No lower extremity tenderness nor edema. Neurologic:    NIH score equals 2, performed by me at bedside. The patient has no pronator drift. The patient has  normal cranial nerve exam. Extraocular movements are normal. Visual fields are normal. Patient has 5 out of 5 strength in all extremities, there is a slightly noticeable weakness involving the left hand and left arm as compared to the right. There is no numbness or gross, acute sensory abnormality in the extremities bilaterally for some slight decrease in sensation comparatively on the left side of the forehead and left arm. No speech disturbance. No dysarthria. No aphasia. No ataxia. Normal finger nose finger bilat. Patient speaking in full and clear sentences.   Skin:  Skin is warm, dry and intact. No rash noted. Psychiatric: Mood and affect are normal. Speech and behavior  are normal.  VAN scale NEGATIVE.  FAST ED score = zero. (No evidence of LVO) ____________________________________________   LABS (all labs ordered are listed, but only abnormal results are displayed)  Labs Reviewed  BASIC METABOLIC PANEL - Abnormal; Notable for the following components:      Result Value   Glucose, Bld 103 (*)    GFR calc non Af Amer 55 (*)    All other components within normal limits  URINALYSIS, COMPLETE (UACMP) WITH MICROSCOPIC - Abnormal; Notable for the following components:   Color, Urine YELLOW (*)    APPearance CLEAR (*)    Leukocytes, UA TRACE (*)    All other components within normal limits  CBC  PROTIME-INR  APTT   ____________________________________________  EKG  Reviewed and are by me at 1146 Heart rate 60 QRS 99 QTc 410 Normal sinus rhythm no evidence of ischemia ____________________________________________  RADIOLOGY  Ct Head Wo Contrast  Result Date: 01/22/2018 CLINICAL DATA:  77 year old female with acute headache and LEFT-sided weakness for 1 day. EXAM: CT HEAD WITHOUT CONTRAST TECHNIQUE: Contiguous axial images were obtained from the base of the skull through the vertex without intravenous contrast. COMPARISON:  10/04/2016 and prior CTs FINDINGS: Brain: No  evidence of acute infarction, hemorrhage, hydrocephalus, extra-axial collection or mass lesion/mass effect. Mild chronic small-vessel white matter ischemic changes again noted. Vascular: Mild carotid atherosclerotic calcifications noted. Skull: Normal. Negative for fracture or focal lesion. Sinuses/Orbits: No acute finding. Other: None. IMPRESSION: 1. No evidence of acute intracranial abnormality 2. Mild chronic small-vessel white matter ischemic changes. Electronically Signed   By: Margarette Canada M.D.   On: 01/22/2018 13:30    CT head negative for acute.  Reviewed by me. ____________________________________________   PROCEDURES  Procedure(s) performed: None  Procedures  Critical Care performed: No  ____________________________________________   INITIAL IMPRESSION / ASSESSMENT AND PLAN / ED COURSE  Pertinent labs & imaging results that were available during my care of the patient were reviewed by me and considered in my medical decision making (see chart for details).  Patient with history of A. fib on Eliquis presents for evaluation of roughly 2 days of a feeling of slight weakness on the left side but a notable increase in left arm weakness at 4 PM yesterday.  Symptoms concerning for possible stroke or intracranial hemorrhage.  Slight right-sided headache is associated, she does report a nosebleed but I am not certain as we give actually correlates with her other symptoms well.  Lab work is relatively reassuring, will obtain head CT.  Outside of the window for treatment for TPA and in addition is currently on active Eliquis therapy  She does not appear to have any evidence of large vessel occlusion, her symptoms and right present are mild and improved from where they were at 4 PM yesterday.  Plan to obtain a CT of the head, if negative for acute will give salicylate plan to admit for further work-up under the hospitalist service for concerns of a possible stroke.  No associated cardiac or  pulmonary symptoms.  No associated abdominal or systemic symptoms.  ----------------------------------------- 1:46 PM on 01/22/2018 -----------------------------------------  Patient resting comfortably, no concerns.  Stable exam.  Updated on CAT scan, will provide aspirin, and admit to the hospital for further stroke work-up.  I am highly suspicious she may have had a small ischemic stroke causing her left hand weakness.  Well outside of the TPA window, anticoagulated, and negative for large vessel occlusion by screening exam and low pretest  probability.  Discussed case with Dr. Bridgett Larsson who will admit for further work-up.  Patient and family agreeable with plan      ____________________________________________   FINAL CLINICAL IMPRESSION(S) / ED DIAGNOSES  Final diagnoses:  Ischemic stroke ([place])  Left arm weakness      NEW MEDICATIONS STARTED DURING THIS VISIT:  New Prescriptions   No medications on file     Note:  This document was prepared using Dragon voice recognition software and may include unintentional dictation errors.     Delman Kitten, MD 01/22/18 1347

## 2018-01-22 NOTE — ED Triage Notes (Signed)
Patient c/o nose bleed that started yesterday, another episode today. Also experiencing left leg weakness and left arm weakness that started yesterday.

## 2018-01-23 ENCOUNTER — Observation Stay (HOSPITAL_BASED_OUTPATIENT_CLINIC_OR_DEPARTMENT_OTHER)
Admit: 2018-01-23 | Discharge: 2018-01-23 | Disposition: A | Discharge status: Home / Self Care | Payer: Medicare Other | Attending: Internal Medicine | Admitting: Internal Medicine

## 2018-01-23 ENCOUNTER — Observation Stay: Payer: Medicare Other

## 2018-01-23 DIAGNOSIS — I10 Essential (primary) hypertension: Secondary | ICD-10-CM | POA: Diagnosis not present

## 2018-01-23 DIAGNOSIS — G459 Transient cerebral ischemic attack, unspecified: Secondary | ICD-10-CM

## 2018-01-23 DIAGNOSIS — R29898 Other symptoms and signs involving the musculoskeletal system: Secondary | ICD-10-CM

## 2018-01-23 DIAGNOSIS — I480 Paroxysmal atrial fibrillation: Secondary | ICD-10-CM | POA: Diagnosis not present

## 2018-01-23 DIAGNOSIS — R040 Epistaxis: Secondary | ICD-10-CM | POA: Diagnosis not present

## 2018-01-23 LAB — CBC
HCT: 38.5 % (ref 35.0–47.0)
Hemoglobin: 13.1 g/dL (ref 12.0–16.0)
MCH: 31.3 pg (ref 26.0–34.0)
MCHC: 34 g/dL (ref 32.0–36.0)
MCV: 92.2 fL (ref 80.0–100.0)
Platelets: 267 10*3/uL (ref 150–440)
RBC: 4.17 MIL/uL (ref 3.80–5.20)
RDW: 13.7 % (ref 11.5–14.5)
WBC: 8.3 10*3/uL (ref 3.6–11.0)

## 2018-01-23 LAB — BASIC METABOLIC PANEL WITH GFR
Anion gap: 6 (ref 5–15)
BUN: 22 mg/dL (ref 8–23)
CO2: 27 mmol/L (ref 22–32)
Calcium: 8.8 mg/dL — ABNORMAL LOW (ref 8.9–10.3)
Chloride: 109 mmol/L (ref 98–111)
Creatinine, Ser: 1.11 mg/dL — ABNORMAL HIGH (ref 0.44–1.00)
GFR calc Af Amer: 55 mL/min — ABNORMAL LOW
GFR calc non Af Amer: 48 mL/min — ABNORMAL LOW
Glucose, Bld: 111 mg/dL — ABNORMAL HIGH (ref 70–99)
Potassium: 4.1 mmol/L (ref 3.5–5.1)
Sodium: 142 mmol/L (ref 135–145)

## 2018-01-23 MED ORDER — ATORVASTATIN CALCIUM 40 MG PO TABS: 40.0000 mg | ORAL_TABLET | Freq: Every day | ORAL | 0 refills | Status: DC

## 2018-01-23 NOTE — Evaluation (Addendum)
Physical Therapy Evaluation Patient Details Name: [name] MRN: [id number] DOB: [birth year] Today's Date: 01/23/2018   History of Present Illness  77 y.o. female with a known history of paroxysmal atrial fibrillation on eliquis, hypertension, vertigo, mitral regurgitation presents to hospital secondary to epistaxis and also left-sided weakness. CT and MRI both negative for acute infarct.  Clinical Impression  Patient alert and oriented at start of session, no complaints of pain, dizziness, or facial/LE numbness/tingling. 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 The patient demonstrated sensation and coordination WFLs of UE/LE. Patient demonstrates some muscle weakness L > R, grossly 4-5/ for L, 4/5 for R. Patient demonstrates independent bed mobility, transfers, and ambulation. Higher level balance assessment patient exhibits deficits in balance (SLS b/l 5sec, difficulty with backwards walking). The patient would benefit from outpatient PT to address these deficits in strength and balance to maximize safety and independence.  TINETTI BALANCE ASSESSMENT TOOL BALANCE SECTION Sitting Balance:     1/1  Rises from Chair:   2/2   Attempts to Rise:   2/2  Standing Balance (1st 5 seconds) 2/2  Standing balance:   2/2 Nudged:    2/2 Eyes Closed:    1/1 Turning 360 degrees   0/1      0/1 Sitting down (getting seated):    2/2  Balance Score:    14/16  GAIT SECTION Indication of Gait:   1/1 Step Length and Height  1/2 Foot Clearance:   2/2 Step Symmetry:   1/1 Step Continuity:   1/1 Path:     2/2 Trunk:     2/2 Walking Time:    1/1 Gait Score:    11/12  Combined Score:   25/28      Follow Up Recommendations Outpatient PT    Equipment Recommendations  None recommended by PT    Recommendations for Other Services       Precautions / Restrictions Precautions Precautions: None Restrictions Weight Bearing  Restrictions: No      Mobility  Bed Mobility Overal bed mobility: Independent             General bed mobility comments: pt performs without difficulty, states she typically goes slowly/cautiously because she has vertigo occasionally (none at time of evaluation)  Transfers Overall transfer level: Independent Equipment used: None             General transfer comment: No difficulties noted with ambulation without AD  Ambulation/Gait Ambulation/Gait assistance: Independent Gait Distance (Feet): 160 Feet   Gait Pattern/deviations: Decreased stride length     General Gait Details: Patient ambulates slowly, states she has walked and moved slowly for years due to history of vertigo, patient without complaints of dizziness/vertigo at this time.   Stairs            Wheelchair Mobility    Modified Rankin (Stroke Patients Only)       Balance Overall balance assessment: Needs assistance   Sitting balance-Leahy Scale: Normal       Standing balance-Leahy Scale: Normal   Single Leg Stance - Right Leg: 5 Single Leg Stance - Left Leg: 5     [place] - Eyes Opened: 20 [place] - Eyes Closed: 20 High level balance activites: Side stepping;Backward walking;Sudden stops   Standardized Balance Assessment Standardized Balance Assessment : ([place], see evaluation note)           Pertinent Vitals/Pain Pain Assessment: No/denies pain  Home Living Family/patient expects to be discharged to:: Private residence Living Arrangements: Alone Available Help at Discharge: Family Type of Home: House Home Access: Level entry     Home Layout: One [place]: Shower seat;Grab bars - tub/shower      Prior Function Level of Independence: Independent         Comments: works part time as a Oceanographer, 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 Reports no falls.     Hand Dominance   Dominant Hand: Right    Extremity/Trunk Assessment    Upper Extremity Assessment Upper Extremity Assessment: RUE deficits/detail;LUE deficits/detail;Defer to OT evaluation RUE Deficits / Details: grossly 4/5 RUE Sensation: WNL RUE Coordination: WNL LUE Deficits / Details: slight difference in strength from RUE, grossly 4-/5, patient is R handed LUE Sensation: WNL LUE Coordination: WNL    Lower Extremity Assessment Lower Extremity Assessment: RLE deficits/detail;LLE deficits/detail RLE Deficits / Details: grossly 4/5 RLE Sensation: WNL RLE Coordination: WNL LLE Deficits / Details: slight difference from RLE, grossly 4-/5 LLE Sensation: WNL LLE Coordination: WNL    Cervical / Trunk Assessment Cervical / Trunk Assessment: Normal  Communication   Communication: No difficulties  Cognition Arousal/Alertness: Awake/alert Behavior During Therapy: WFL for tasks assessed/performed Overall Cognitive Status: Within Functional Limits for tasks assessed                                        General Comments      Exercises Other Exercises Other Exercises: Educated pt in importance of BUE strength and exercises to perform to maintain good BUE functional use   Assessment/Plan    PT Assessment Patient needs continued PT services  PT Problem List Decreased strength;Decreased balance       PT Treatment Interventions Balance training;Neuromuscular re-education;Gait training;Patient/family education;Therapeutic activities;Therapeutic exercise    PT Goals (Current goals can be found in the Care Plan section)  Acute Rehab PT Goals Patient Stated Goal: Patient wants to go home PT Goal Formulation: With patient Time For Goal Achievement: 02/06/18 Potential to Achieve Goals: Good Additional Goals Additional Goal #1: Patient will demonstrate SLS b/l >10seconds to decrease risk of falls.    Frequency Min 2X/week   Barriers to discharge        Co-evaluation               AM-PAC PT "6 Clicks" Daily Activity   Outcome Measure Difficulty turning over in bed (including adjusting bedclothes, sheets and blankets)?: None Difficulty moving from lying on back to sitting on the side of the bed? : None Difficulty sitting down on and standing up from a chair with arms (e.g., wheelchair, bedside commode, etc,.)?: None Help needed moving to and from a bed to chair (including a wheelchair)?: None Help needed walking in hospital room?: None Help needed climbing 3-5 steps with a railing? : A Little 6 Click Score: 23    End of Session Equipment Utilized During Treatment: Gait belt Activity Tolerance: Patient tolerated treatment well Patient left: in bed;with family/visitor present;Other (comment)(no bed alarm per nursing/low fall risk) Nurse Communication: Mobility status PT Visit Diagnosis: Unsteadiness on feet (R26.81);Muscle weakness (generalized) (M62.81);Other abnormalities of gait and mobility (R26.89)    Time: 0630-1601 PT Time Calculation (min) (ACUTE ONLY): 21 min   Charges:   PT Evaluation $PT Eval Low Complexity: 1 Low         Lieutenant Diego  PT, DPT 9:37 AM,01/23/18 [phone]

## 2018-01-23 NOTE — Discharge Summary (Signed)
[facility] [place] NAME: [name]    MR#:  [id number]  DATE OF BIRTH:  [birth year]  DATE OF ADMISSION:  01/22/2018 ADMITTING PHYSICIAN: Gladstone Lighter, MD  DATE OF DISCHARGE: 01/23/2018  PRIMARY CARE PHYSICIAN: Steele Sizer, MD   ADMISSION DIAGNOSIS:  Stroke (cerebrum) ([place]) [I63.9] Left arm weakness [R29.898] Ischemic stroke ([place]) [I63.9]  DISCHARGE DIAGNOSIS:  Active Problems:   TIA (transient ischemic attack) Opthalamic artery aneurysm Hypertension Hyperlipidemia  Paroxysmal atrial fibrillation   SECONDARY DIAGNOSIS:   Past Medical History:  Diagnosis Date  . History of stress test    a. 02/2013 Nl stress test.  . Hypertension   . Insomnia, unspecified   . Mitral regurgitation    a. echo 01/2016: nl LV sys fxn, mild MR, w/o pulm htn, nl atrial size  . Osteoporosis, unspecified   . Paroxysmal atrial fibrillation ([place]) 01/2016   a. diagnosed 01/2016; b. has been on eliquis, pradaxa, and xarelto at varying times 2/2 cost of each medication-->currently on eliquis (11/2016); c. CHADS2VASc --> 3 (HTN, age x 1, female).  . Pure hypercholesterolemia   . Unspecified asthma(493.90)   . Unspecified transient cerebral ischemia   . Vertigo      ADMITTING HISTORY [name] is an 77 y.o. female with a history of PAF on Eliquis who reports that on Thursday of last week she noted that her left leg felt heavy and somewhat weak.  On Saturday she had a nose bleed and later in the day noted that she had spasm of her left arm with continued left leg weakness.  As she worked with the arm this resolved  Yesterday she had another nosebleed which is unusual for her and presented for evaluation.  She continues to report that the left leg "just does not feel right".   Initial NIHSS of 0.  HOSPITAL COURSE:  Patient was admitted to medical floor.  She was worked up with CT head, MRI and MRA brain and carotid ultrasound she also had EEG  which was a normal study.  Patient's left upper extremity and lower extremity weakness completely improved.  No difficulty in speech, swallowing food.  Patient was seen by neurology attending during the stay in the hospital and advised to continue anticoagulation with oral Eliquis and to continue statin medication for hyperlipidemia.  Patient will be discharged on statin medication and continue oral Eliquis.  MRA of the brain showed ophthalmic artery aneurysm.  Patient needs periodic imaging as outpatient and follow-up with neurology and neurosurgery as outpatient.  Patient hemodynamically stable will be discharged home with referral for  physical therapy services.  CONSULTS OBTAINED:  Treatment Team:  Leotis Pain, MD  DRUG ALLERGIES:   Allergies  Allergen Reactions  . Ace Inhibitors Swelling    Patient states her tongue swell.  [name] Penicillins Anxiety    Has patient had a PCN reaction causing immediate rash, facial/tongue/throat swelling, SOB or lightheadedness with hypotension: No Has patient had a PCN reaction causing severe rash involving mucus membranes or skin necrosis: No Has patient had a PCN reaction that required hospitalization No Has patient had a PCN reaction occurring within the last 10 years: No If all of the above answers are "NO", then may proceed with Cephalosporin use.    DISCHARGE MEDICATIONS:   Allergies as of 01/23/2018      Reactions   Ace Inhibitors Swelling   Patient states her tongue swell.   Penicillins Anxiety   Has patient had  a PCN reaction causing immediate rash, facial/tongue/throat swelling, SOB or lightheadedness with hypotension: No Has patient had a PCN reaction causing severe rash involving mucus membranes or skin necrosis: No Has patient had a PCN reaction that required hospitalization No Has patient had a PCN reaction occurring within the last 10 years: No If all of the above answers are "NO", then may proceed with Cephalosporin use.       Medication List    TAKE these medications   amLODipine 5 MG tablet Commonly known as:  NORVASC TAKE 1 TABLET BY MOUTH EVERY DAY   atorvastatin 40 MG tablet Commonly known as:  LIPITOR Take 1 tablet (40 mg total) by mouth daily at 6 PM.   cloNIDine 0.1 MG tablet Commonly known as:  CATAPRES Take 1 tablet (0.1 mg total) by mouth 2 (two) times daily. bp is above 160/90   diltiazem 30 MG tablet Commonly known as:  CARDIZEM Take 1 tablet (30 mg total) by mouth every 6 (six) hours as needed (for tachycardia/recurrent Afib.).   ELIQUIS 5 MG Tabs tablet Generic drug:  apixaban TAKE 1 TABLET(5 MG) BY MOUTH TWICE DAILY   meclizine 25 MG tablet Commonly known as:  ANTIVERT TAKE 1 TABLET(25 MG) BY MOUTH EVERY 8 HOURS AS NEEDED   tiZANidine 2 MG tablet Commonly known as:  ZANAFLEX Take 1 tablet (2 mg total) by mouth at bedtime.   triamcinolone 55 MCG/ACT Aero nasal inhaler Commonly known as:  NASACORT Place 2 sprays into the nose daily.       Today  Patient seen and evaluated today Left arm and left leg weakness improved No difficulty in speech No difficulty in swallowing food  VITAL SIGNS:  Blood pressure (!) 145/66, pulse (!) 56, temperature 97.8 F (36.6 C), temperature source Oral, resp. rate 18, height 5\' 3"  (1.6 m), weight 82.1 kg (181 lb), SpO2 96 %.  I/O:    Intake/Output Summary (Last 24 hours) at 01/23/2018 1504 Last data filed at 01/23/2018 1002 Gross per 24 hour  Intake 360 ml  Output -  Net 360 ml    PHYSICAL EXAMINATION:  Physical Exam  GENERAL:  77 y.o.-year-old patient lying in the bed with no acute distress.  LUNGS: Normal breath sounds bilaterally, no wheezing, rales,rhonchi or crepitation. No use of accessory muscles of respiration.  CARDIOVASCULAR: S1, S2 normal. No murmurs, rubs, or gallops.  ABDOMEN: Soft, non-tender, non-distended. Bowel sounds present. No organomegaly or mass.  NEUROLOGIC: Moves all 4 extremities. PSYCHIATRIC: The patient  is alert and oriented x 3.  SKIN: No obvious rash, lesion, or ulcer.   DATA REVIEW:   CBC Recent Labs  Lab 01/23/18 0347  WBC 8.3  HGB 13.1  HCT 38.5  PLT 267    Chemistries  Recent Labs  Lab 01/23/18 0347  NA 142  K 4.1  CL 109  CO2 27  GLUCOSE 111*  BUN 22  CREATININE 1.11*  CALCIUM 8.8*    Cardiac Enzymes No results for input(s): TROPONINI in the last 168 hours.  Microbiology Results  No results found for this or any previous visit.  RADIOLOGY:  Ct Head Wo Contrast  Result Date: 01/22/2018 CLINICAL DATA:  77 year old female with acute headache and LEFT-sided weakness for 1 day. EXAM: CT HEAD WITHOUT CONTRAST TECHNIQUE: Contiguous axial images were obtained from the base of the skull through the vertex without intravenous contrast. COMPARISON:  10/04/2016 and prior CTs FINDINGS: Brain: No evidence of acute infarction, hemorrhage, hydrocephalus, extra-axial collection or mass lesion/mass effect.  Mild chronic small-vessel white matter ischemic changes again noted. Vascular: Mild carotid atherosclerotic calcifications noted. Skull: Normal. Negative for fracture or focal lesion. Sinuses/Orbits: No acute finding. Other: None. IMPRESSION: 1. No evidence of acute intracranial abnormality 2. Mild chronic small-vessel white matter ischemic changes. Electronically Signed   By: Margarette Canada M.D.   On: 01/22/2018 13:30   Mr Jodene Nam Head Wo Contrast  Result Date: 01/22/2018 CLINICAL DATA:  Stroke follow-up EXAM: MRI HEAD WITHOUT CONTRAST MRA HEAD WITHOUT CONTRAST TECHNIQUE: Multiplanar, multiecho pulse sequences of the brain and surrounding structures were obtained without intravenous contrast. Angiographic images of the head were obtained using MRA technique without contrast. COMPARISON:  None. FINDINGS: MRI HEAD FINDINGS Brain: No acute infarction, hemorrhage, hydrocephalus, extra-axial collection or mass lesion. Mild patchy FLAIR hyperintensity in the cerebral white matter and pons from  chronic small vessel ischemia. Remote perforator infarct in the left external capsule and putamen with chronic blood products. Normal brain volume. Vascular: Major flow voids are preserved.  Arterial findings below Skull and upper cervical spine: No focal marrow lesion. Sinuses/Orbits: Bilateral cataract resection.  No pathologic finding MRA HEAD FINDINGS Symmetric carotid and vertebral arteries. Negative for branch occlusion. High-grade narrowing at the distal right vertebral artery. The basilar is smooth and widely patent. Superiorly directed outpouching at the left ICA ophthalmic segment measuring 3 mm. IMPRESSION: Brain MRI: 1. No acute finding. 2. 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 Mild chronic small vessel ischemia in the cerebral white matter and pons. Intracranial MRA: 1. No emergent finding. 2. High-grade distal right V4 segment narrowing. 3. 3 mm left ophthalmic ICA segment aneurysm. Electronically Signed   By: Monte Fantasia M.D.   On: 01/22/2018 16:43   Mr Brain Wo Contrast  Result Date: 01/22/2018 CLINICAL DATA:  Stroke follow-up EXAM: MRI HEAD WITHOUT CONTRAST MRA HEAD WITHOUT CONTRAST TECHNIQUE: Multiplanar, multiecho pulse sequences of the brain and surrounding structures were obtained without intravenous contrast. Angiographic images of the head were obtained using MRA technique without contrast. COMPARISON:  None. FINDINGS: MRI HEAD FINDINGS Brain: No acute infarction, hemorrhage, hydrocephalus, extra-axial collection or mass lesion. Mild patchy FLAIR hyperintensity in the cerebral white matter and pons from chronic small vessel ischemia. Remote perforator infarct in the left external capsule and putamen with chronic blood products. Normal brain volume. Vascular: Major flow voids are preserved.  Arterial findings below Skull and upper cervical spine: No focal marrow lesion. Sinuses/Orbits: Bilateral cataract resection.  No pathologic finding MRA HEAD FINDINGS Symmetric  carotid and vertebral arteries. Negative for branch occlusion. High-grade narrowing at the distal right vertebral artery. The basilar is smooth and widely patent. Superiorly directed outpouching at the left ICA ophthalmic segment measuring 3 mm. IMPRESSION: Brain MRI: 1. No acute finding. 2. 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 Mild chronic small vessel ischemia in the cerebral white matter and pons. Intracranial MRA: 1. No emergent finding. 2. High-grade distal right V4 segment narrowing. 3. 3 mm left ophthalmic ICA segment aneurysm. Electronically Signed   By: Monte Fantasia M.D.   On: 01/22/2018 16:43   US Carotid Bilateral  Result Date: 01/22/2018 CLINICAL DATA:  CVA EXAM: BILATERAL CAROTID DUPLEX ULTRASOUND TECHNIQUE: Pearline Cables scale imaging, color Doppler and duplex ultrasound were performed of bilateral carotid and vertebral arteries in the neck. COMPARISON:  05/16/2013 carotid Doppler scan FINDINGS: Criteria: Quantification of carotid stenosis is based on velocity parameters that correlate the residual internal carotid diameter with NASCET-based stenosis levels, using the diameter of the distal internal carotid  lumen as the denominator for stenosis measurement. The following velocity measurements were obtained: RIGHT ICA: 37 cm/sec CCA: 64 cm/sec SYSTOLIC ICA/CCA RATIO:  0.6 ECA:  54 cm/sec LEFT ICA: 44 cm/sec CCA: 58 cm/sec SYSTOLIC ICA/CCA RATIO:  0.8 ECA:  40 cm/sec RIGHT CAROTID ARTERY: No significant plaque in the right carotid bifurcation or internal carotid artery. RIGHT VERTEBRAL ARTERY:  Normal antegrade flow. LEFT CAROTID ARTERY: No significant plaque in the left carotid bifurcation or internal carotid artery. LEFT VERTEBRAL ARTERY:  Normal antegrade flow. IMPRESSION: 1. No significant stenosis in the carotid arteries bilaterally by Doppler criteria (0-49%). No significant carotid plaque. 2. Normal antegrade flow in both vertebral arteries. Electronically Signed   By: Ilona Sorrel  M.D.   On: 01/22/2018 15:23    Follow up with PCP in 1 week.  Management plans discussed with the patient, family and they are in agreement.  CODE STATUS: Full code    Code Status Orders  (From admission, onward)        Start     Ordered   01/22/18 1515  Full code  Continuous     01/22/18 1514    Code Status History    Date Active Date Inactive Code Status Order ID Comments User Context   02/15/2016 1535 02/16/2016 0805 Full Code [id number]  Idelle Crouch, MD Inpatient      TOTAL TIME TAKING CARE OF THIS PATIENT ON DAY OF DISCHARGE: more than 35 minutes.   Saundra Shelling M.D on 01/23/2018 at 3:04 PM  Between 7am to 6pm - Pager - [phone]  After 6pm go to [email or web address] - password [email or web address] [facility]  Office  [phone]  CC: Primary care physician; Steele Sizer, MD  Note: This dictation was prepared with Dragon dictation along with smaller phrase technology. Any transcriptional errors that result from this process are unintentional.

## 2018-01-23 NOTE — Care Management Obs Status (Signed)
[place] NOTIFICATION   Patient Details  Name: [name] MRN: [id number] Date of Birth: [birth year]   Medicare Observation Status Notification Given:  Yes: Discussed with Ms. Lee Kalt, RN 01/23/2018, 8:09 AM

## 2018-01-23 NOTE — Progress Notes (Signed)
eeg completed ° °

## 2018-01-23 NOTE — Progress Notes (Signed)
Advanced care plan. Purpose of the Encounter: CODE STATUS Parties in Attendance: Patient Patient's Decision Capacity: Good  Subjective/Patient's story: presented to emergency room with left arm and left leg weakness Objective/Medical story Patient admitted for work-up for stroke Needs MRI brain, carotid ultrasound and echocardiogram Goals of care determination:  Advance care directives and goals of care discussed Patient wants everything done which includes cardiac resuscitation, intubation and ventilator if the need arises CODE STATUS: Full code Time spent discussing advanced care planning: 16 minutes

## 2018-01-23 NOTE — Procedures (Signed)
ELECTROENCEPHALOGRAM REPORT   Patient: [name]       Room #: 117A-AA EEG No. ID: [id number] Age: 77 y.o.        Sex: female Referring Physician: Pyreddy Report Date:  01/23/2018        Interpreting Physician: Alexis Goodell  History: [name] is an 77 y.o. female with episode of left arm spasms  Medications:  Eliquis, Norvasc, ASA, Lipitor, Catapres, Zanaflex  Conditions of Recording:  This is a 21 channel routine scalp EEG performed with bipolar and monopolar montages arranged in accordance to the international 10/20 system of electrode placement. One channel was dedicated to EKG recording.  The patient is in the awake, drowsy and asleep states.  Description:  The waking background activity consists of a low voltage, symmetrical, fairly well organized, 9 Hz alpha activity, seen from the parieto-occipital and posterior temporal regions.  Low voltage fast activity, poorly organized, is seen anteriorly and is at times superimposed on more posterior regions.  A mixture of theta and alpha rhythms are seen from the central and temporal regions. The patient drowses with slowing to irregular, low voltage theta and beta activity.   The patient goes in to a light sleep with symmetrical sleep spindles, vertex central sharp transients and irregular slow activity.   No epileptiform activity is noted.   Hyperventilation was not performed.  Intermittent photic stimulation was performed but failed to illicit any change in the tracing.     IMPRESSION: Normal electroencephalogram, awake, asleep and with activation procedures. There are no focal lateralizing or epileptiform features.   Alexis Goodell, MD Neurology [phone] 01/23/2018, 2:03 PM

## 2018-01-23 NOTE — Progress Notes (Signed)
SLP Cancellation Note  Patient Details Name: TATISHA CERINO MRN: [id number] DOB: 23-Apr-1941   Cancelled treatment:       Reason Eval/Treat Not Completed: SLP screened, no needs identified, will sign off(chart reviewed; consulted NSG/MD then met w/ pt). Pt denied any difficulty swallowing and is currently on a regular diet; tolerates swallowing pills w/ water per NSG. Pt conversed at conversational level w/out deficits noted; pt denied any speech-language deficits and was ordering her lunch via phone upon leaving. No further skilled ST services indicated as pt appears at her baseline. Pt agreed. NSG to reconsult if any change in status.     Orinda Kenner, MS, CCC-SLP Watson,Katherine 01/23/2018, 11:33 AM

## 2018-01-23 NOTE — Care Management Note (Signed)
Case Management Note  Patient Details  Name: [name] MRN: [id number] Date of Birth: [birth year]  Subjective/Objective:                  Admitted to [facility] under observation status with the diagnosis of TIA. Lives alone. Son is Darnelle Maffucci [phone]). Next scheduled appointment with Dr. Ancil Boozer 01/24/18.  Prescriptions are filled at [facility] in [place] care of all basic activities of daily living herself, drives. Good appetite. No falls. Family will tyransport  Action/Plan: No follow-up needs identified at this time.   Expected Discharge Date:                  Expected Discharge Plan:     In-House Referral:     Discharge planning Services     Post Acute Care Choice:    Choice offered to:     DME Arranged:    DME Agency:     HH Arranged:    HH Agency:     Status of Service:     If discussed at [employer], dates discussed:    Additional Comments:  Shelbie Ammons, RN MSN CCM Care Management [phone] 01/23/2018, 8:00 AM

## 2018-01-23 NOTE — Care Management (Signed)
Outpatient referral faxed to Rehab department that [facility] Discharge to home today per Dr. Cornell Barman RN MSN CCM Care Management [phone]

## 2018-01-23 NOTE — Evaluation (Signed)
Occupational Therapy Evaluation Patient Details Name: [name] MRN: [id number] DOB: 1941-03-20 Today's Date: 01/23/2018    History of Present Illness 77 y.o. female with a known history of paroxysmal atrial fibrillation on eliquis, hypertension, mitral regurgitation presents to hospital secondary to epistaxis and also left-sided weakness. CT and MRI both negative for acute infarct.   Clinical Impression   Pt seen for OT evaluation this date. Prior to hospital admission, pt was independent in all aspects of ADL/IADL and ambulating independently with no AD. Pt denies falls history in past 12 months. Pt lives by herself in a 1 story home with level entry and walk-in shower. Currently pt reporting symptoms have resolved. BUE strength mildly impaired bilaterally. Pt states she has arthritis and gets shots for it in her shoulders. No strength, sensory, coordination, cognitive, balance, or visual deficits appreciated with assessment. Pt demonstrates baseline independence to perform ADL and mobility tasks. No skilled OT needs identified. Will sign off. Please re-consult if additional OT needs arise.     Follow Up Recommendations  No OT follow up    Equipment Recommendations  None recommended by OT    Recommendations for Other Services       Precautions / Restrictions Precautions Precautions: None Restrictions Weight Bearing Restrictions: No      Mobility Bed Mobility Overal bed mobility: Independent             General bed mobility comments: pt performs without difficulty, states she typically goes slowly/cautiously because she has vertigo occasionally (none at time of evaluation)  Transfers Overall transfer level: Independent               General transfer comment: No difficulties noted with ambulation without AD    Balance Overall balance assessment: No apparent balance deficits (not formally assessed)                                          ADL either performed or assessed with clinical judgement   ADL Overall ADL's : Independent                                       General ADL Comments: Pt able to perform all tasks without difficulty. Demonstrated ability to perform LB dressing and toileting independently.      Vision Baseline Vision/History: Wears glasses Wears Glasses: Reading only Patient Visual Report: No change from baseline Vision Assessment?: No apparent visual deficits     Perception     Praxis      Pertinent Vitals/Pain Pain Assessment: No/denies pain     Hand Dominance Right   Extremity/Trunk Assessment Upper Extremity Assessment Upper Extremity Assessment: Generalized weakness(BUE grossly 4-/5, intact sensation and coordination with RAM, finger to nose, and thumb opposition testing)   Lower Extremity Assessment Lower Extremity Assessment: Overall WFL for tasks assessed;Defer to PT evaluation(overall WFL, RLE grossly 4+/5, LLE grossly 4/5, intact sensation and coordination with heel to shin testing)   Cervical / Trunk Assessment Cervical / Trunk Assessment: Normal   Communication Communication Communication: No difficulties   Cognition Arousal/Alertness: Awake/alert Behavior During Therapy: WFL for tasks assessed/performed Overall Cognitive Status: Within Functional Limits for tasks assessed  General Comments       Exercises Other Exercises Other Exercises: Educated pt in importance of BUE strength and exercises to perform to maintain good BUE functional use   Shoulder Instructions      Home Living Family/patient expects to be discharged to:: Private residence Living Arrangements: Alone Available Help at Discharge: Family Type of Home: House Home Access: Level entry     Home Layout: One level     Bathroom Shower/Tub: Occupational psychologist: Butts: Shower seat;Grab bars -  tub/shower          Prior Functioning/Environment Level of Independence: Independent        Comments: Pt indep with mobility, ADL, IADL includin driving, and 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 No falls.        OT Problem List:        OT Treatment/Interventions:      OT Goals(Current goals can be found in the care plan section) Acute Rehab OT Goals Patient Stated Goal: to go home OT Goal Formulation: All assessment and education complete, DC therapy  OT Frequency:     Barriers to D/C:            Co-evaluation              AM-PAC PT "6 Clicks" Daily Activity     Outcome Measure Help from another person eating meals?: None Help from another person taking care of personal grooming?: None Help from another person toileting, which includes using toliet, bedpan, or urinal?: None Help from another person bathing (including washing, rinsing, drying)?: None Help from another person to put on and taking off regular upper body clothing?: None Help from another person to put on and taking off regular lower body clothing?: None 6 Click Score: 24   End of Session Equipment Utilized During Treatment: Gait belt  Activity Tolerance: Patient tolerated treatment well Patient left: in bed;with call bell/phone within reach  OT Visit Diagnosis: Other abnormalities of gait and mobility (R26.89)                Time: 8469-6295 OT Time Calculation (min): 19 min Charges:  OT General Charges $OT Visit: 1 Visit OT Evaluation $OT Eval Low Complexity: 1 Low  Jeni Salles, MPH, MS, OTR/L ascom [phone] 01/23/18, 9:39 AM

## 2018-01-23 NOTE — Progress Notes (Signed)
Discussed discharge instructions and medications with patient. IV removed. All questions addressed. Patient transported home via car by her son.  Clarise Cruz, RN, BSN

## 2018-01-23 NOTE — Consult Note (Signed)
Referring Physician: Pyreddy    Chief Complaint: Left sided weakness  HPI: [name] is an 77 y.o. female with a history of PAF on Eliquis who reports that on Thursday of last week she noted that her left leg felt heavy and somewhat weak.  On Saturday she had a nose bleed and later in the day noted that she had spasm of her left arm with continued left leg weakness.  As she worked with the arm this resolved  Yesterday she had another nosebleed which is unusual for her and presented for evaluation.  She continues to report that the left leg "just does not feel right".   Initial NIHSS of 0.  Date last known well: Date: 01/19/2018 Time last known well: Unable to determine tPA Given: No: Outside time window  Past Medical History:  Diagnosis Date  . History of stress test    a. 02/2013 Nl stress test.  . Hypertension   . Insomnia, unspecified   . Mitral regurgitation    a. echo 01/2016: nl LV sys fxn, mild MR, w/o pulm htn, nl atrial size  . Osteoporosis, unspecified   . Paroxysmal atrial fibrillation ([place]) 01/2016   a. diagnosed 01/2016; b. has been on eliquis, pradaxa, and xarelto at varying times 2/2 cost of each medication-->currently on eliquis (11/2016); c. CHADS2VASc --> 3 (HTN, age x 1, female).  . Pure hypercholesterolemia   . Unspecified asthma(493.90)   . Unspecified transient cerebral ischemia   . Vertigo     Past Surgical History:  Procedure Laterality Date  . ABDOMINAL HYSTERECTOMY    . CATARACT EXTRACTION Right 08/31/2016  . COLONOSCOPY WITH PROPOFOL N/A 03/04/2015   Procedure: COLONOSCOPY WITH PROPOFOL;  Surgeon: Lucilla Lame, MD;  Location: [facility] ENDOSCOPY;  Service: Endoscopy;  Laterality: N/A;    Family History  Problem Relation Age of Onset  . COPD Father   . Hypertension Mother   . Aneurysm Mother   . Hypertension Brother   . Cancer Brother    Social History:  reports that she has never smoked. She has never used smokeless tobacco. She reports that she does  not drink alcohol or use drugs.  Allergies:  Allergies  Allergen Reactions  . Ace Inhibitors Swelling    Patient states her tongue swell.  [name] Penicillins Anxiety    Has patient had a PCN reaction causing immediate rash, facial/tongue/throat swelling, SOB or lightheadedness with hypotension: No Has patient had a PCN reaction causing severe rash involving mucus membranes or skin necrosis: No Has patient had a PCN reaction that required hospitalization No Has patient had a PCN reaction occurring within the last 10 years: No If all of the above answers are "NO", then may proceed with Cephalosporin use.    Medications:  I have reviewed the patient's current medications. Prior to Admission:  Medications Prior to Admission  Medication Sig Dispense Refill Last Dose  . amLODipine (NORVASC) 5 MG tablet TAKE 1 TABLET BY MOUTH EVERY DAY 90 tablet 1 01/22/2018 at 0800  . cloNIDine (CATAPRES) 0.1 MG tablet Take 1 tablet (0.1 mg total) by mouth 2 (two) times daily. bp is above 160/90 30 tablet 3 PRN at PRN  . diltiazem (CARDIZEM) 30 MG tablet Take 1 tablet (30 mg total) by mouth every 6 (six) hours as needed (for tachycardia/recurrent Afib.). 30 tablet 3 PRN at PRN  . ELIQUIS 5 MG TABS tablet TAKE 1 TABLET(5 MG) BY MOUTH TWICE DAILY 60 tablet 3 01/22/2018 at 0800  . meclizine (ANTIVERT) 25  MG tablet TAKE 1 TABLET(25 MG) BY MOUTH EVERY 8 HOURS AS NEEDED 30 tablet 0 PRN at PRN  . tiZANidine (ZANAFLEX) 2 MG tablet Take 1 tablet (2 mg total) by mouth at bedtime. 30 tablet 0 01/21/2018 at 2000  . triamcinolone (NASACORT) 55 MCG/ACT AERO nasal inhaler Place 2 sprays into the nose daily. (Patient not taking: Reported on 01/22/2018) 1 Inhaler 0 Not Taking at Unknown time   Scheduled: . amLODipine  5 mg Oral Daily  . apixaban  5 mg Oral BID  . aspirin  81 mg Oral Daily  . atorvastatin  40 mg Oral q1800  . cloNIDine  0.1 mg Oral BID  . tiZANidine  2 mg Oral QHS  . triamcinolone  2 spray Nasal Daily     ROS: History obtained from the patient  General ROS: negative for - chills, fatigue, fever, night sweats, weight gain or weight loss Psychological ROS: negative for - behavioral disorder, hallucinations, memory difficulties, mood swings or suicidal ideation Ophthalmic ROS: negative for - blurry vision, double vision, eye pain or loss of vision ENT ROS: as noted in HPI Allergy and Immunology ROS: negative for - hives or itchy/watery eyes Hematological and Lymphatic ROS: negative for - bleeding problems, bruising or swollen lymph nodes Endocrine ROS: negative for - galactorrhea, hair pattern changes, polydipsia/polyuria or temperature intolerance Respiratory ROS: negative for - cough, hemoptysis, shortness of breath or wheezing Cardiovascular ROS: negative for - chest pain, dyspnea on exertion, edema or irregular heartbeat Gastrointestinal ROS: negative for - abdominal pain, diarrhea, hematemesis, nausea/vomiting or stool incontinence Genito-Urinary ROS: negative for - dysuria, hematuria, incontinence or urinary frequency/urgency Musculoskeletal ROS: left neck pain Neurological ROS: as noted in HPI Dermatological ROS: negative for rash and skin lesion changes  Physical Examination: Blood pressure (!) 155/77, pulse (!) 54, temperature 98.6 F (37 C), temperature source Oral, resp. rate 18, height 5\' 3"  (1.6 m), weight 82.1 kg (181 lb), SpO2 95 %.  HEENT-  Normocephalic, no lesions, without obvious abnormality.  Normal external eye and conjunctiva.  Normal TM's bilaterally.  Normal auditory canals and external ears. Normal external nose, mucus membranes and septum.  Normal pharynx. Cardiovascular- S1, S2 normal, pulses palpable throughout   Lungs- chest clear, no wheezing, rales, normal symmetric air entry Abdomen- soft, non-tender; bowel sounds normal; no masses,  no organomegaly Extremities- no edema Lymph-no adenopathy palpable Musculoskeletal-no joint tenderness, deformity or  swelling Skin-warm and dry, no hyperpigmentation, vitiligo, or suspicious lesions  Neurological Examination   Mental Status: Alert, oriented, thought content appropriate.  Speech fluent without evidence of aphasia.  Able to follow 3 step commands without difficulty. Cranial Nerves: II: Discs flat bilaterally; Visual fields grossly normal, pupils equal, round, reactive to light and accommodation III,IV, VI: ptosis not present, extra-ocular motions intact bilaterally V,VII: smile symmetric, facial light touch sensation normal bilaterally VIII: hearing normal bilaterally IX,X: gag reflex present XI: bilateral shoulder shrug XII: midline tongue extension Motor: Right : Upper extremity   5/5    Left:     Upper extremity   5/5  Lower extremity   5/5     Lower extremity   5/5 Tone and bulk:normal tone throughout; no atrophy noted Sensory: Pinprick and light touch intact throughout, bilaterally Deep Tendon Reflexes: 2+ in the upper extremities, absent in the lower extremities Plantars: Right: mute   Left: mute Cerebellar: Normal finger-to-nose, normal rapid alternating movements and normal heel-to-shin testing bilaterally Gait: not tested due safety concerns    Laboratory Studies:  Basic Metabolic  Panel: Recent Labs  Lab 01/22/18 1154 01/23/18 0347  NA 141 142  K 4.0 4.1  CL 108 109  CO2 26 27  GLUCOSE 103* 111*  BUN 23 22  CREATININE 0.98 1.11*  CALCIUM 9.4 8.8*    Liver Function Tests: No results for input(s): AST, ALT, ALKPHOS, BILITOT, PROT, ALBUMIN in the last 168 hours. No results for input(s): LIPASE, AMYLASE in the last 168 hours. No results for input(s): AMMONIA in the last 168 hours.  CBC: Recent Labs  Lab 01/22/18 1154 01/23/18 0347  WBC 7.5 8.3  HGB 14.0 13.1  HCT 40.6 38.5  MCV 92.1 92.2  PLT 266 267    Cardiac Enzymes: No results for input(s): CKTOTAL, CKMB, CKMBINDEX, TROPONINI in the last 168 hours.  BNP: Invalid input(s): POCBNP  CBG: No  results for input(s): GLUCAP in the last 168 hours.  Microbiology: No results found for this or any previous visit.  Coagulation Studies: Recent Labs    01/22/18 1211  LABPROT 12.9  INR 0.98    Urinalysis:  Recent Labs  Lab 01/22/18 1254  COLORURINE YELLOW*  LABSPEC 1.011  PHURINE 5.0  GLUCOSEU NEGATIVE  HGBUR NEGATIVE  BILIRUBINUR NEGATIVE  KETONESUR NEGATIVE  PROTEINUR NEGATIVE  NITRITE NEGATIVE  LEUKOCYTESUR TRACE*    Lipid Panel:    Component Value Date/Time   CHOL 153 01/22/2018 1154   CHOL 172 02/28/2015 1131   TRIG 36 01/22/2018 1154   HDL 40 (L) 01/22/2018 1154   CHOLHDL 3.8 01/22/2018 1154   VLDL 7 01/22/2018 1154   LDLCALC 106 (H) 01/22/2018 1154   LDLCALC 134 (H) 10/17/2017 0829    HgbA1C:  Lab Results  Component Value Date   HGBA1C 6.1 (H) 01/22/2018    Urine Drug Screen:  No results found for: LABOPIA, COCAINSCRNUR, LABBENZ, AMPHETMU, THCU, LABBARB  Alcohol Level: No results for input(s): ETH in the last 168 hours.  Other results: EKG: sinus rhythm at 56 bpm.  Imaging: Ct Head Wo Contrast  Result Date: 01/22/2018 CLINICAL DATA:  77 year old female with acute headache and LEFT-sided weakness for 1 day. EXAM: CT HEAD WITHOUT CONTRAST TECHNIQUE: Contiguous axial images were obtained from the base of the skull through the vertex without intravenous contrast. COMPARISON:  10/04/2016 and prior CTs FINDINGS: Brain: No evidence of acute infarction, hemorrhage, hydrocephalus, extra-axial collection or mass lesion/mass effect. Mild chronic small-vessel white matter ischemic changes again noted. Vascular: Mild carotid atherosclerotic calcifications noted. Skull: Normal. Negative for fracture or focal lesion. Sinuses/Orbits: No acute finding. Other: None. IMPRESSION: 1. No evidence of acute intracranial abnormality 2. Mild chronic small-vessel white matter ischemic changes. Electronically Signed   By: Margarette Canada M.D.   On: 01/22/2018 13:30   Mr Jodene Nam Head Wo  Contrast  Result Date: 01/22/2018 CLINICAL DATA:  Stroke follow-up EXAM: MRI HEAD WITHOUT CONTRAST MRA HEAD WITHOUT CONTRAST TECHNIQUE: Multiplanar, multiecho pulse sequences of the brain and surrounding structures were obtained without intravenous contrast. Angiographic images of the head were obtained using MRA technique without contrast. COMPARISON:  None. FINDINGS: MRI HEAD FINDINGS Brain: No acute infarction, hemorrhage, hydrocephalus, extra-axial collection or mass lesion. Mild patchy FLAIR hyperintensity in the cerebral white matter and pons from chronic small vessel ischemia. Remote perforator infarct in the left external capsule and putamen with chronic blood products. Normal brain volume. Vascular: Major flow voids are preserved.  Arterial findings below Skull and upper cervical spine: No focal marrow lesion. Sinuses/Orbits: Bilateral cataract resection.  No pathologic finding MRA HEAD FINDINGS Symmetric carotid and  vertebral arteries. Negative for branch occlusion. High-grade narrowing at the distal right vertebral artery. The basilar is smooth and widely patent. Superiorly directed outpouching at the left ICA ophthalmic segment measuring 3 mm. IMPRESSION: Brain MRI: 1. No acute finding. 2. 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 Mild chronic small vessel ischemia in the cerebral white matter and pons. Intracranial MRA: 1. No emergent finding. 2. High-grade distal right V4 segment narrowing. 3. 3 mm left ophthalmic ICA segment aneurysm. Electronically Signed   By: Monte Fantasia M.D.   On: 01/22/2018 16:43   Mr Brain Wo Contrast  Result Date: 01/22/2018 CLINICAL DATA:  Stroke follow-up EXAM: MRI HEAD WITHOUT CONTRAST MRA HEAD WITHOUT CONTRAST TECHNIQUE: Multiplanar, multiecho pulse sequences of the brain and surrounding structures were obtained without intravenous contrast. Angiographic images of the head were obtained using MRA technique without contrast. COMPARISON:  None. FINDINGS:  MRI HEAD FINDINGS Brain: No acute infarction, hemorrhage, hydrocephalus, extra-axial collection or mass lesion. Mild patchy FLAIR hyperintensity in the cerebral white matter and pons from chronic small vessel ischemia. Remote perforator infarct in the left external capsule and putamen with chronic blood products. Normal brain volume. Vascular: Major flow voids are preserved.  Arterial findings below Skull and upper cervical spine: No focal marrow lesion. Sinuses/Orbits: Bilateral cataract resection.  No pathologic finding MRA HEAD FINDINGS Symmetric carotid and vertebral arteries. Negative for branch occlusion. High-grade narrowing at the distal right vertebral artery. The basilar is smooth and widely patent. Superiorly directed outpouching at the left ICA ophthalmic segment measuring 3 mm. IMPRESSION: Brain MRI: 1. No acute finding. 2. 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 Mild chronic small vessel ischemia in the cerebral white matter and pons. Intracranial MRA: 1. No emergent finding. 2. High-grade distal right V4 segment narrowing. 3. 3 mm left ophthalmic ICA segment aneurysm. Electronically Signed   By: Monte Fantasia M.D.   On: 01/22/2018 16:43   US Carotid Bilateral  Result Date: 01/22/2018 CLINICAL DATA:  CVA EXAM: BILATERAL CAROTID DUPLEX ULTRASOUND TECHNIQUE: Pearline Cables scale imaging, color Doppler and duplex ultrasound were performed of bilateral carotid and vertebral arteries in the neck. COMPARISON:  05/16/2013 carotid Doppler scan FINDINGS: Criteria: Quantification of carotid stenosis is based on velocity parameters that correlate the residual internal carotid diameter with NASCET-based stenosis levels, using the diameter of the distal internal carotid lumen as the denominator for stenosis measurement. The following velocity measurements were obtained: RIGHT ICA: 37 cm/sec CCA: 64 cm/sec SYSTOLIC ICA/CCA RATIO:  0.6 ECA:  54 cm/sec LEFT ICA: 44 cm/sec CCA: 58 cm/sec SYSTOLIC ICA/CCA  RATIO:  0.8 ECA:  40 cm/sec RIGHT CAROTID ARTERY: No significant plaque in the right carotid bifurcation or internal carotid artery. RIGHT VERTEBRAL ARTERY:  Normal antegrade flow. LEFT CAROTID ARTERY: No significant plaque in the left carotid bifurcation or internal carotid artery. LEFT VERTEBRAL ARTERY:  Normal antegrade flow. IMPRESSION: 1. No significant stenosis in the carotid arteries bilaterally by Doppler criteria (0-49%). No significant carotid plaque. 2. Normal antegrade flow in both vertebral arteries. Electronically Signed   By: Ilona Sorrel M.D.   On: 01/22/2018 15:23    Assessment: 77 y.o. female with a history of atrial fibrillation on Eliquis who presents with complaints of nose bleed and left sided weakness.  MRI of the brain reviewed and shows no acute changes.  MRA shows critical stenosis of the right V4 segment.  Also noted is a left 62mm ophthalmic artery aneurysm.  Carotid dopplers show no evidence of hemodynamically significant stenosis.  Echocardiogram pending.  A1c 6.1, LDL 106.  Stroke Risk Factors - atrial fibrillation, hyperlipidemia and hypertension  Plan: 1. PT consult, OT consult, Speech consult 2. Prophylactic therapy-Continue Eliquis 3. Telemetry monitoring 4. Frequent neuro checks 5. Statin for lipid management with target LDL<70. 6. Patient to have repeat MRA or CTA of the brain in 6-12 months for follow up of aneurysm.     Alexis Goodell, MD Neurology [phone] 01/23/2018, 12:31 PM

## 2018-01-23 NOTE — Progress Notes (Signed)
*  PRELIMINARY RESULTS* Echocardiogram 2D Echocardiogram has been performed.  [name] 01/23/2018, 12:23 PM

## 2018-01-24 ENCOUNTER — Ambulatory Visit (INDEPENDENT_AMBULATORY_CARE_PROVIDER_SITE_OTHER): Payer: Medicare Other

## 2018-01-24 ENCOUNTER — Encounter: Payer: Self-pay | Admitting: Family Medicine

## 2018-01-24 ENCOUNTER — Ambulatory Visit (INDEPENDENT_AMBULATORY_CARE_PROVIDER_SITE_OTHER): Payer: Medicare Other | Admitting: Family Medicine

## 2018-01-24 ENCOUNTER — Telehealth: Payer: Self-pay | Admitting: Family Medicine

## 2018-01-24 ENCOUNTER — Telehealth: Payer: Self-pay

## 2018-01-24 VITALS — BP 132/70 | HR 60 | Temp 97.7°F | Resp 14 | Ht 63.0 in | Wt 179.3 lb

## 2018-01-24 DIAGNOSIS — I671 Cerebral aneurysm, nonruptured: Secondary | ICD-10-CM | POA: Insufficient documentation

## 2018-01-24 DIAGNOSIS — Z09 Encounter for follow-up examination after completed treatment for conditions other than malignant neoplasm: Secondary | ICD-10-CM | POA: Diagnosis not present

## 2018-01-24 DIAGNOSIS — E2839 Other primary ovarian failure: Secondary | ICD-10-CM | POA: Diagnosis not present

## 2018-01-24 DIAGNOSIS — I729 Aneurysm of unspecified site: Secondary | ICD-10-CM | POA: Diagnosis not present

## 2018-01-24 DIAGNOSIS — I10 Essential (primary) hypertension: Secondary | ICD-10-CM

## 2018-01-24 DIAGNOSIS — R944 Abnormal results of kidney function studies: Secondary | ICD-10-CM

## 2018-01-24 DIAGNOSIS — Z0000 Encounter for general adult medical examination without abnormal findings: Secondary | ICD-10-CM | POA: Diagnosis not present

## 2018-01-24 DIAGNOSIS — I480 Paroxysmal atrial fibrillation: Secondary | ICD-10-CM

## 2018-01-24 DIAGNOSIS — Z8673 Personal history of transient ischemic attack (TIA), and cerebral infarction without residual deficits: Secondary | ICD-10-CM

## 2018-01-24 DIAGNOSIS — R5383 Other fatigue: Secondary | ICD-10-CM

## 2018-01-24 MED ORDER — ATORVASTATIN CALCIUM 40 MG PO TABS: 40.0000 mg | ORAL_TABLET | Freq: Every day | ORAL | 0 refills | Status: DC

## 2018-01-24 NOTE — Telephone Encounter (Signed)
Transition Care Management Follow-up Telephone Call  Date of discharge and from where: 01/23/18 from [facility]  How have you been since you were released from the hospital? States she is not feeling sick but feels very tired. Had experienced epistaxis prior to admission and during admission. Denies numbness or tingling to L extremity but that it does feel weak.  Any questions or concerns? No   Items Reviewed:  Did the pt receive and understand the discharge instructions provided? Yes   Medications obtained and verified? Yes   Any new allergies since your discharge? Yes   Dietary orders reviewed? Yes  Do you have support at home? Yes   Other (ie: DME, Home Health, etc) N/A  Functional Questionnaire: (I = Independent and D = Dependent) ADL's: I  Bathing/Dressing- I   Meal Prep- I  Eating- I  Maintaining continence- I  Transferring/Ambulation- I  Managing Meds- I   Follow up appointments reviewed:    PCP Hospital f/u appt confirmed? Yes  Scheduled to see Dr. Ancil Boozer today.  [facility] f/u appt confirmed? Yes , but has not been able to schedule appts with Dr. Manuella Ghazi or Dr. Fletcher Anon. States she is aware that she needs to schedule these appts  Are transportation arrangements needed? No   If their condition worsens, is the pt aware to call  their PCP or go to the ED? Yes  Was the patient provided with contact information for the PCP's office or ED? Yes  Was the pt encouraged to call back with questions or concerns? Yes

## 2018-01-24 NOTE — Telephone Encounter (Signed)
Pt needs appt for a hosp follow up. Please advise where to put this appt.

## 2018-01-24 NOTE — Progress Notes (Signed)
Name: [name]   MRN: [id number]    DOB: 08/10/1940   Date:01/24/2018       Progress Note  Subjective  Chief Complaint  Chief Complaint  Patient presents with  . Hospitalization Follow-up    Symptoms of left sided weakness-Started her on Lipitor  . Transient Ischemic Attack    Feels very weak and wore down    HPI  Hospital discharge f/u: she had one episode of nose bleed on 01/20/2018 followed by  left hand weakness and spam on 01/21/2018 , she had to massage her left hand with her right hand, it lasted about 2-3 minutes and after another nose bleed on Sunday her son took to urgent care in [facility] and was transported to [facility] for further evaluation because she had also noticed left side weakness. At [facility] she had CT that was negative, followed by MRA and MRI, that showed old infarct, aneurysm of left ophthalmic artery and also narrowing of vertebral artery. She has been on Eliquis given by Dr. Fletcher Anon for years because of paroxysmal Afib, she was given rx of Atorvastatin that has not been filled yet and advised to follow up with me and also Dr. Manuella Ghazi and Dr. Fletcher Anon. She still needs appointment with Dr. Manuella Ghazi that we will place today. She states she still feels a generalized malaise but no focal weakness, prior to discharge PT recommended outpatient PT . GFR was lower than normal and we will recheck it today.   HTN: bp is at goal, no side effects of medication, no chest pain or palpitation    Patient Active Problem List   Diagnosis Date Noted  . History of CVA (cerebrovascular accident) without residual deficits 01/24/2018  . 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 TIA (transient ischemic attack) 01/22/2018  . History of cataract surgery, right 09/01/2016  . Paroxysmal atrial fibrillation ([facility]) 02/24/2016  . Vertigo 02/24/2016  . Low HDL (under 40) 02/17/2016  . Dizziness 02/15/2016  . Hx of colonic polyps   . Benign neoplasm of sigmoid colon   . Asthma, mild intermittent 02/21/2015   . Hyperglycemia 02/21/2015  . Essential hypertension 02/20/2015  . Inconclusive mammogram due to dense breasts 01/02/2015  . Dense breast 01/02/2015  . Abnormal EKG 02/23/2013    Past Surgical History:  Procedure Laterality Date  . ABDOMINAL HYSTERECTOMY    . CATARACT EXTRACTION Right 08/31/2016  . COLONOSCOPY WITH PROPOFOL N/A 03/04/2015   Procedure: COLONOSCOPY WITH PROPOFOL;  Surgeon: Lucilla Lame, MD;  Location: [facility] ENDOSCOPY;  Service: Endoscopy;  Laterality: N/A;    Family History  Problem Relation Age of Onset  . COPD Father   . Hypertension Mother   . Aneurysm Mother   . Hypertension Brother   . Cancer Brother        pancreatic  . Healthy Brother     Social History   Socioeconomic History  . Marital status: Single    Spouse name: Not on file  . Number of children: 2  . Years of education: Not on file  . Highest education level: Associate degree: academic program  Occupational History  . Occupation: Retired  Scientific laboratory technician  . Financial resource strain: Not hard at all  . Food insecurity:    Worry: Never true    Inability: Never true  . Transportation needs:    Medical: No    Non-medical: No  Tobacco Use  . Smoking status: Never Smoker  . Smokeless tobacco: Never Used  . Tobacco comment: smoking cessation materials  not required  Substance and Sexual Activity  . Alcohol use: No    Alcohol/week: 0.0 oz  . Drug use: No  . Sexual activity: Not Currently  Lifestyle  . Physical activity:    Days per week: 3 days    Minutes per session: 30 min  . Stress: Not at all  Relationships  . Social connections:    Talks on phone: Patient refused    Gets together: Patient refused    Attends religious service: Patient refused    Active member of club or organization: Patient refused    Attends meetings of clubs or organizations: Patient refused    Relationship status: Patient refused  . Intimate partner violence:    Fear of current or ex partner: No     Emotionally abused: No    Physically abused: No    Forced sexual activity: No  Other Topics Concern  . Not on file  Social History Narrative   Independent at baseline. Lives at home with family     Current Outpatient Medications:  .  amLODipine (NORVASC) 5 MG tablet, TAKE 1 TABLET BY MOUTH EVERY DAY, Disp: 90 tablet, Rfl: 1 .  atorvastatin (LIPITOR) 40 MG tablet, Take 1 tablet (40 mg total) by mouth daily at 6 PM., Disp: 30 tablet, Rfl: 0 .  diltiazem (CARDIZEM) 30 MG tablet, Take 1 tablet (30 mg total) by mouth every 6 (six) hours as needed (for tachycardia/recurrent Afib.)., Disp: 30 tablet, Rfl: 3 .  ELIQUIS 5 MG TABS tablet, TAKE 1 TABLET(5 MG) BY MOUTH TWICE DAILY, Disp: 60 tablet, Rfl: 3 .  meclizine (ANTIVERT) 25 MG tablet, TAKE 1 TABLET(25 MG) BY MOUTH EVERY 8 HOURS AS NEEDED, Disp: 30 tablet, Rfl: 0  Allergies  Allergen Reactions  . Ace Inhibitors Swelling    Patient states her tongue swell.  [name] Penicillins Anxiety    Has patient had a PCN reaction causing immediate rash, facial/tongue/throat swelling, SOB or lightheadedness with hypotension: No Has patient had a PCN reaction causing severe rash involving mucus membranes or skin necrosis: No Has patient had a PCN reaction that required hospitalization No Has patient had a PCN reaction occurring within the last 10 years: No If all of the above answers are "NO", then may proceed with Cephalosporin use.     ROS  Constitutional: Negative for fever, positive for mild  weight change.  Respiratory: Negative for cough and shortness of breath.   Cardiovascular: Negative for chest pain or palpitations.  Gastrointestinal: Negative for abdominal pain, no bowel changes.  Musculoskeletal: Negative for gait problem or joint swelling.  Skin: Negative for rash.  Neurological: Negative for dizziness or headache.  No other specific complaints in a complete review of systems (except as listed in HPI above).  Objective  Vitals:    01/24/18 1010  BP: 132/70  Pulse: 60  Resp: 14  Temp: 97.7 F (36.5 C)  TempSrc: Oral  SpO2: 98%  Weight: 179 lb 4.8 oz (81.3 kg)  Height: '5\' 3"'$  (1.6 m)    Body mass index is 31.76 kg/m.  Physical Exam  Constitutional: Patient appears well-developed and well-nourished. Obese  No distress.  HEENT: head atraumatic, normocephalic, pupils equal and reactive to light, neck supple, throat within normal limits Cardiovascular: Normal rate, regular rhythm and normal heart sounds.  No murmur heard. No BLE edema. Pulmonary/Chest: Effort normal and breath sounds normal. No respiratory distress. Abdominal: Soft.  There is no tenderness. Psychiatric: Patient has a normal mood and affect. behavior is normal.  Judgment and thought content normal. Neurological: normal grip, cranial nerves intake, normal gait.   Recent Results (from the past 2160 hour(s))  Cologuard     Status: None   Collection Time: 11/08/17 12:00 AM  Result Value Ref Range   Cologuard Negative   Basic metabolic panel     Status: Abnormal   Collection Time: 01/22/18 11:54 AM  Result Value Ref Range   Sodium 141 135 - 145 mmol/L   Potassium 4.0 3.5 - 5.1 mmol/L   Chloride 108 98 - 111 mmol/L   CO2 26 22 - 32 mmol/L   Glucose, Bld 103 (H) 70 - 99 mg/dL   BUN 23 8 - 23 mg/dL   Creatinine, Ser 0.98 0.44 - 1.00 mg/dL   Calcium 9.4 8.9 - 10.3 mg/dL   GFR calc non Af Amer 55 (L) >60 mL/min   GFR calc Af Amer >60 >60 mL/min    Comment: (NOTE) The eGFR has been calculated using the CKD EPI equation. This calculation has not been validated in all clinical situations. eGFR's persistently <60 mL/min signify possible Chronic Kidney Disease.    Anion gap 7 5 - 15    Comment: Performed at [facility], [address] [place], [place] [address]  CBC     Status: None   Collection Time: 01/22/18 11:54 AM  Result Value Ref Range   WBC 7.5 3.6 - 11.0 K/uL   RBC 4.41 3.80 - 5.20 MIL/uL   Hemoglobin 14.0 12.0 - 16.0 g/dL    HCT 40.6 35.0 - 47.0 %   MCV 92.1 80.0 - 100.0 fL   MCH 31.7 26.0 - 34.0 pg   MCHC 34.5 32.0 - 36.0 g/dL   RDW 13.7 11.5 - 14.5 %   Platelets 266 150 - 440 K/uL    Comment: Performed at [facility], [address] [place], [place] [address]  Lipid panel     Status: Abnormal   Collection Time: 01/22/18 11:54 AM  Result Value Ref Range   Cholesterol 153 0 - 200 mg/dL   Triglycerides 36 <150 mg/dL   HDL 40 (L) >40 mg/dL   Total CHOL/HDL Ratio 3.8 RATIO   VLDL 7 0 - 40 mg/dL   LDL Cholesterol 106 (H) 0 - 99 mg/dL    Comment:        Total Cholesterol/HDL:CHD Risk Coronary Heart Disease Risk Table                     Men   Women  1/2 Average Risk   3.4   3.3  Average Risk       5.0   4.4  2 X Average Risk   9.6   7.1  3 X Average Risk  23.4   11.0        Use the calculated Patient Ratio above and the CHD Risk Table to determine the patient's CHD Risk.        ATP III CLASSIFICATION (LDL):  <100     mg/dL   Optimal  100-129  mg/dL   Near or Above                    Optimal  130-159  mg/dL   Borderline  160-189  mg/dL   High  >190     mg/dL   Very High Performed at [facility], [address] [place], [place] [address]   Protime-INR     Status: None  Collection Time: 01/22/18 12:11 PM  Result Value Ref Range   Prothrombin Time 12.9 11.4 - 15.2 seconds   INR 0.98     Comment: Performed at [facility], [address] [place], [place] [address]  APTT     Status: None   Collection Time: 01/22/18 12:11 PM  Result Value Ref Range   aPTT 28 24 - 36 seconds    Comment: Performed at [facility], [address] [place], [place] [address]  Urinalysis, Complete w Microscopic     Status: Abnormal   Collection Time: 01/22/18 12:54 PM  Result Value Ref Range   Color, Urine YELLOW (A) YELLOW   APPearance CLEAR (A) CLEAR   Specific Gravity, Urine 1.011 1.005 - 1.030   pH 5.0 5.0 - 8.0   Glucose, UA NEGATIVE NEGATIVE mg/dL   Hgb urine  dipstick NEGATIVE NEGATIVE   Bilirubin Urine NEGATIVE NEGATIVE   Ketones, ur NEGATIVE NEGATIVE mg/dL   Protein, ur NEGATIVE NEGATIVE mg/dL   Nitrite NEGATIVE NEGATIVE   Leukocytes, UA TRACE (A) NEGATIVE   RBC / HPF 0-5 0 - 5 RBC/hpf   WBC, UA 0-5 0 - 5 WBC/hpf   Bacteria, UA NONE SEEN NONE SEEN   Squamous Epithelial / LPF 0-5 0 - 5   Mucus PRESENT     Comment: Performed at [facility], [address] [place], [place] [address]  Hemoglobin A1c     Status: Abnormal   Collection Time: 01/22/18  3:15 PM  Result Value Ref Range   Hgb A1c MFr Bld 6.1 (H) 4.8 - 5.6 %    Comment: (NOTE) Pre diabetes:          5.7%-6.4% Diabetes:              >6.4% Glycemic control for   <7.0% adults with diabetes    Mean Plasma Glucose 128.37 mg/dL    Comment: Performed at [facility] [address] [place], [place] [address]  Basic metabolic panel     Status: Abnormal   Collection Time: 01/23/18  3:47 AM  Result Value Ref Range   Sodium 142 135 - 145 mmol/L   Potassium 4.1 3.5 - 5.1 mmol/L   Chloride 109 98 - 111 mmol/L   CO2 27 22 - 32 mmol/L   Glucose, Bld 111 (H) 70 - 99 mg/dL   BUN 22 8 - 23 mg/dL   Creatinine, Ser 1.11 (H) 0.44 - 1.00 mg/dL   Calcium 8.8 (L) 8.9 - 10.3 mg/dL   GFR calc non Af Amer 48 (L) >60 mL/min   GFR calc Af Amer 55 (L) >60 mL/min    Comment: (NOTE) The eGFR has been calculated using the CKD EPI equation. This calculation has not been validated in all clinical situations. eGFR's persistently <60 mL/min signify possible Chronic Kidney Disease.    Anion gap 6 5 - 15    Comment: Performed at [facility], [address] [place], [place] [address]  CBC     Status: None   Collection Time: 01/23/18  3:47 AM  Result Value Ref Range   WBC 8.3 3.6 - 11.0 K/uL   RBC 4.17 3.80 - 5.20 MIL/uL   Hemoglobin 13.1 12.0 - 16.0 g/dL   HCT 38.5 35.0 - 47.0 %   MCV 92.2 80.0 - 100.0 fL   MCH 31.3 26.0 - 34.0 pg   MCHC 34.0 32.0 - 36.0 g/dL   RDW  13.7 11.5 - 14.5 %  Platelets 267 150 - 440 K/uL    Comment: Performed at [facility], [address] [place], [place] [address]      PHQ2/9: Depression screen [facility] 2/9 01/24/2018 01/03/2017 09/01/2016 08/17/2016 01/26/2016  Decreased Interest 0 0 0 0 0  Down, Depressed, Hopeless 0 0 0 0 0  PHQ - 2 Score 0 0 0 0 0  Altered sleeping 0 - - - -  Tired, decreased energy 0 - - - -  Change in appetite 0 - - - -  Feeling bad or failure about yourself  0 - - - -  Trouble concentrating 0 - - - -  Moving slowly or fidgety/restless 0 - - - -  Suicidal thoughts 0 - - - -  PHQ-9 Score 0 - - - -  Difficult doing work/chores Not difficult at all - - - -     Fall Risk: Fall Risk  01/24/2018 12/26/2017 10/17/2017 05/11/2017 01/03/2017  Falls in the past year? No No No No No  Risk for fall due to : Impaired vision;Other (Comment) - - - -  Risk for fall due to: Comment wears eyeglasses; TIA - - - -      Assessment & Plan  1. Hospital discharge follow-up  Reviewed records, she has not filled rx yet, but explained to go get it today, referral placed for neurologist evaluation   2. Aneurysm [facility])  - Ambulatory referral to Neurology  3. History of CVA (cerebrovascular accident) without residual deficits  - atorvastatin (LIPITOR) 40 MG tablet; Take 1 tablet (40 mg total) by mouth daily at 6 PM.  Dispense: 90 tablet; Refill: 0 - Ambulatory referral to Neurology  4. Paroxysmal atrial fibrillation ([facility])  Not in afib today, taking eliquis   5. Essential hypertension  bp is at goal   6. History of TIA (transient ischemic attack)  - Ambulatory referral to Neurology  7. Other fatigue  She will have PT as requested prior to her discharge from [facility]   8. Decreased GFR  - BASIC METABOLIC PANEL WITH GFR

## 2018-01-24 NOTE — Telephone Encounter (Signed)
Please put her in for today in the spot that was cancelled

## 2018-01-24 NOTE — Telephone Encounter (Signed)
Done pt seen 01-24-18 for cpe and nurse health advisor

## 2018-01-24 NOTE — Progress Notes (Addendum)
Subjective:   [name] is a 77 y.o. female who presents for Medicare Annual (Subsequent) preventive examination.  Review of Systems:  N/A Cardiac Risk Factors include: advanced age (>31men, >91 women);hypertension;obesity (BMI >30kg/m2);sedentary lifestyle;dyslipidemia     Objective:     Vitals: BP 132/70 (BP Location: Left Arm, Patient Position: Sitting, Cuff Size: Large)   Pulse 60   Temp 97.7 F (36.5 C) (Oral)   Resp 14   Ht 5\' 3"  (1.6 m)   Wt 179 lb 4.8 oz (81.3 kg)   SpO2 98%   BMI 31.76 kg/m   Body mass index is 31.76 kg/m.  Advanced Directives 01/24/2018 01/22/2018 01/22/2018 01/22/2018 01/04/2017 01/03/2017 10/04/2016  Does Patient Have a Medical Advance Directive? No No No No No No No  Would patient like information on creating a medical advance directive? Yes (MAU/Ambulatory/Procedural Areas - Information given) No - Patient declined - - - - No - Patient declined    Tobacco Social History   Tobacco Use  Smoking Status Never Smoker  Smokeless Tobacco Never Used  Tobacco Comment   smoking cessation materials not required     Counseling given: No Comment: smoking cessation materials not required  Clinical Intake:  Pre-visit preparation completed: Yes  Pain : No/denies pain   BMI - recorded: 32.2 Nutritional Status: BMI > 30  Obese Nutritional Risks: None Diabetes: No  How often do you need to have someone help you when you read instructions, pamphlets, or other written materials from your doctor or pharmacy?: 1 - Never  Interpreter Needed?: No  Information entered by :: AEversole, LPN  Hospitalizations/ED visits and surgeries occurring within the previous 12 months:  Pt was admitted on 01/22/18 @ [facility] for TIA and treated by Dr. Estanislado Pandy. Pt is scheduled to be seen for hosp f/u today with Dr. Ancil Boozer.   Past Medical History:  Diagnosis Date  . History of stress test    a. 02/2013 Nl stress test.  . Hypertension   . Insomnia, unspecified   .  Mitral regurgitation    a. echo 01/2016: nl LV sys fxn, mild MR, w/o pulm htn, nl atrial size  . Osteoporosis, unspecified   . Paroxysmal atrial fibrillation ([place]) 01/2016   a. diagnosed 01/2016; b. has been on eliquis, pradaxa, and xarelto at varying times 2/2 cost of each medication-->currently on eliquis (11/2016); c. CHADS2VASc --> 3 (HTN, age x 1, female).  . Pure hypercholesterolemia   . TIA (transient ischemic attack)   . Unspecified asthma(493.90)   . Unspecified transient cerebral ischemia   . Vertigo    Past Surgical History:  Procedure Laterality Date  . ABDOMINAL HYSTERECTOMY    . CATARACT EXTRACTION Right 08/31/2016  . COLONOSCOPY WITH PROPOFOL N/A 03/04/2015   Procedure: COLONOSCOPY WITH PROPOFOL;  Surgeon: Lucilla Lame, MD;  Location: [facility] ENDOSCOPY;  Service: Endoscopy;  Laterality: N/A;   Family History  Problem Relation Age of Onset  . COPD Father   . Hypertension Mother   . Aneurysm Mother   . Hypertension Brother   . Cancer Brother        pancreatic  . Healthy Brother    Social History   Socioeconomic History  . Marital status: Single    Spouse name: Not on file  . Number of children: 2  . Years of education: Not on file  . Highest education level: Associate degree: academic program  Occupational History  . Occupation: Retired  Scientific laboratory technician  . Financial resource strain: Not hard at  all  . Food insecurity:    Worry: Never true    Inability: Never true  . Transportation needs:    Medical: No    Non-medical: No  Tobacco Use  . Smoking status: Never Smoker  . Smokeless tobacco: Never Used  . Tobacco comment: smoking cessation materials not required  Substance and Sexual Activity  . Alcohol use: No    Alcohol/week: 0.0 oz  . Drug use: No  . Sexual activity: Not Currently  Lifestyle  . Physical activity:    Days per week: 3 days    Minutes per session: 30 min  . Stress: Not at all  Relationships  . Social connections:    Talks on phone: Patient  refused    Gets together: Patient refused    Attends religious service: Patient refused    Active member of club or organization: Patient refused    Attends meetings of clubs or organizations: Patient refused    Relationship status: Patient refused  Other Topics Concern  . Not on file  Social History Narrative   Independent at baseline. Lives at home with family    Outpatient Encounter Medications as of 01/24/2018  Medication Sig  . amLODipine (NORVASC) 5 MG tablet TAKE 1 TABLET BY MOUTH EVERY DAY  . cloNIDine (CATAPRES) 0.1 MG tablet Take 1 tablet (0.1 mg total) by mouth 2 (two) times daily. bp is above 160/90  . diltiazem (CARDIZEM) 30 MG tablet Take 1 tablet (30 mg total) by mouth every 6 (six) hours as needed (for tachycardia/recurrent Afib.).  [name] ELIQUIS 5 MG TABS tablet TAKE 1 TABLET(5 MG) BY MOUTH TWICE DAILY  . meclizine (ANTIVERT) 25 MG tablet TAKE 1 TABLET(25 MG) BY MOUTH EVERY 8 HOURS AS NEEDED  . triamcinolone (NASACORT) 55 MCG/ACT AERO nasal inhaler Place 2 sprays into the nose daily.  [name] atorvastatin (LIPITOR) 40 MG tablet Take 1 tablet (40 mg total) by mouth daily at 6 PM.  . tiZANidine (ZANAFLEX) 2 MG tablet Take 1 tablet (2 mg total) by mouth at bedtime.   No facility-administered encounter medications on file as of 01/24/2018.    Pt has not yet picked up her Rx for Lipitor since d/c from [facility]. States she will pick it up and begin taking today.  Activities of Daily Living In your present state of health, do you have any difficulty performing the following activities: 01/24/2018 01/22/2018  Hearing? N N  Comment denies hearing aids -  Vision? N N  Comment wears eyeglasses -  Difficulty concentrating or making decisions? N N  Walking or climbing stairs? Y N  Comment fatigue -  Dressing or bathing? N N  Doing errands, shopping? N N  Preparing Food and eating ? N -  Comment denies dentures -  Using the Toilet? N -  In the past six months, have you accidently leaked  urine? N -  Do you have problems with loss of bowel control? N -  Managing your Medications? N -  Managing your Finances? N -  Housekeeping or managing your Housekeeping? N -  Some recent data might be hidden    Patient Care Team: Steele Sizer, MD as PCP - General (Family Medicine) Wellington Hampshire, MD as Consulting Physician (Cardiology) Vladimir Crofts, MD as Consulting Physician (Neurology) Lucilla Lame, MD as Consulting Physician (Gastroenterology)    Assessment:   This is a routine wellness examination for Kaelani.  Exercise Activities and Dietary recommendations Current Exercise Habits: Structured exercise class, Time (Minutes): 30, Frequency (  Times/Week): 3, Weekly Exercise (Minutes/Week): 90, Intensity: Mild, Exercise limited by: None identified  Goals    . DIET - INCREASE WATER INTAKE     Recommend to drink at least 6-8 8oz glasses of water per day.       Fall Risk Fall Risk  01/24/2018 12/26/2017 10/17/2017 05/11/2017 01/03/2017  Falls in the past year? No No No No No  Risk for fall due to : Impaired vision;Other (Comment) - - - -  Risk for fall due to: Comment wears eyeglasses; TIA - - - -   FALL RISK PREVENTION PERTAINING TO HOME: Is your home free of loose throw rugs in walkways, pet beds, electrical cords, etc? Yes Is there adequate lighting in your home to reduce risk of falls?  Yes Are there stairs in or around your home WITH handrails? Yes  ASSISTIVE DEVICES UTILIZED TO PREVENT FALLS: Use of a cane, walker or w/c? No Grab bars in the bathroom? Yes  Shower chair or a place to sit while bathing? Yes An elevated toilet seat or a handicapped toilet? No  Timed Get Up and Go Performed: Yes. Pt ambulated 10 feet within 29 sec. Gait slow, steady and without the use of an assistive device. No intervention required at this time. Fall risk prevention has been discussed.  Community Resource Referral:  Pt declined my offer to send [employer] Referral to Care  Guide for an elevated toilet seat.  Depression Screen PHQ 2/9 Scores 01/24/2018 01/03/2017 09/01/2016 08/17/2016  PHQ - 2 Score 0 0 0 0  PHQ- 9 Score 0 - - -     Cognitive Function     6CIT Screen 01/24/2018  What Year? 0 points  What month? 0 points  What time? 0 points  Count back from 20 0 points  Months in reverse 0 points  Repeat phrase 2 points  Total Score 2    Immunization History  Administered Date(s) Administered  . Influenza, High Dose Seasonal PF 02/20/2015, 05/11/2017  . Influenza-Unspecified 02/20/2016  . Pneumococcal Conjugate-13 10/17/2017    Qualifies for Shingles Vaccine? Yes. Due for Shingrix. Education has been provided regarding the importance of this vaccine. Pt has been advised to call insurance company to determine out of pocket expense. Advised may also receive vaccine at local pharmacy or Health Dept. Verbalized acceptance and understanding.  Screening Tests Health Maintenance  Topic Date Due  . DEXA SCAN  02/12/2008  . INFLUENZA VACCINE  01/26/2018  . MAMMOGRAM  12/29/2018  . COLONOSCOPY  03/03/2020  . TETANUS/TDAP  02/21/2022  . PNA vac Low Risk Adult  Completed    Cancer Screenings: Lung: Low Dose CT Chest recommended if Age 12-80 years, 30 pack-year currently smoking OR have quit w/in 15years. Patient does not qualify. Breast:  Up to date on Mammogram? Yes. Completed 12/28/17. Repeat every year   Up to date of Bone Density/Dexa? Yes. Completed 12/17/13 but unable to locate report. Ordered today. Message sent to referral coordinator for scheduling purposes. Aware she will receive a call from our office re: her appt. Colorectal: Completed 03/04/15. Repeat every 5 years   Additional Screenings: Hepatitis C Screening: Does not qualify    Plan:  I have personally reviewed and addressed the Medicare Annual Wellness questionnaire and have noted the following in the patient's chart:  A. Medical and social history B. Use of alcohol, tobacco or illicit  drugs  C. Current medications and supplements D. Functional ability and status E.  Nutritional status F.  Physical activity  G. Advance directives H. List of other physicians I.  Hospitalizations, surgeries, and ER visits in previous 12 months J.  [place] such as hearing and vision if needed, cognitive and depression L. Referrals and appointments  In addition, I have reviewed and discussed with patient certain preventive protocols, quality metrics, and best practice recommendations. A written personalized care plan for preventive services as well as general preventive health recommendations were provided to patient.  See attached scanned questionnaire for additional information.   Signed,  Aleatha Borer, LPN Nurse Health Advisor  I have reviewed this encounter including the documentation in this note and/or discussed this patient with the provider, Aleatha Borer, LPN. I am certifying that I agree with the content of this note as supervising physician.  Steele Sizer, MD [facility] 01/24/2018, 1:36 PM

## 2018-01-24 NOTE — Patient Instructions (Addendum)
[name] , Thank you for taking time to come for your Medicare Wellness Visit. I appreciate your ongoing commitment to your health goals. Please review the following plan we discussed and let me know if I can assist you in the future.   Screening recommendations/referrals: Colorectal Screening: Up to date Mammogram: Up to date Bone Density: Ordered today  Vision and Dental Exams: Recommended annual ophthalmology exams for early detection of glaucoma and other disorders of the eye Recommended annual dental exams for proper oral hygiene  Vaccinations: Influenza vaccine: Up to date Pneumococcal vaccine: Up to date Tdap vaccine: Up to date Shingles vaccine: Please call your insurance company to determine your out of pocket expense for the Shingrix vaccine. You may also receive this vaccine at your local pharmacy or Health Dept.   Advanced directives: Advance directive discussed with you today. I have provided a copy for you to complete at home and have notarized. Once this is complete please bring a copy in to our office so we can scan it into your chart.  Goals: Recommend to drink at least 6-8 8oz glasses of water per day.  Next appointment: Please schedule your Annual Wellness Visit with your Nurse Health Advisor in one year.  Preventive Care 24 Years and Older, Female Preventive care refers to lifestyle choices and visits with your health care provider that can promote health and wellness. What does preventive care include?  A yearly physical exam. This is also called an annual well check.  Dental exams once or twice a year.  Routine eye exams. Ask your health care provider how often you should have your eyes checked.  Personal lifestyle choices, including:  Daily care of your teeth and gums.  Regular physical activity.  Eating a healthy diet.  Avoiding tobacco and drug use.  Limiting alcohol use.  Practicing safe sex.  Taking low-dose aspirin every day.  Taking  vitamin and mineral supplements as recommended by your health care provider. What happens during an annual well check? The services and screenings done by your health care provider during your annual well check will depend on your age, overall health, lifestyle risk factors, and family history of disease. Counseling  Your health care provider may ask you questions about your:  Alcohol use.  Tobacco use.  Drug use.  Emotional well-being.  Home and relationship well-being.  Sexual activity.  Eating habits.  History of falls.  Memory and ability to understand (cognition).  Work and work Statistician.  Reproductive health. Screening  You may have the following tests or measurements:  Height, weight, and BMI.  Blood pressure.  Lipid and cholesterol levels. These may be checked every 5 years, or more frequently if you are over 53 years old.  Skin check.  Lung cancer screening. You may have this screening every year starting at age 34 if you have a 30-pack-year history of smoking and currently smoke or have quit within the past 15 years.  Fecal occult blood test (FOBT) of the stool. You may have this test every year starting at age 33.  Flexible sigmoidoscopy or colonoscopy. You may have a sigmoidoscopy every 5 years or a colonoscopy every 10 years starting at age 78.  Hepatitis C blood test.  Hepatitis B blood test.  Sexually transmitted disease (STD) testing.  Diabetes screening. This is done by checking your blood sugar (glucose) after you have not eaten for a while (fasting). You may have this done every 1-3 years.  Bone density scan. This is done  to screen for osteoporosis. You may have this done starting at age 5.  Mammogram. This may be done every 1-2 years. Talk to your health care provider about how often you should have regular mammograms. Talk with your health care provider about your test results, treatment options, and if necessary, the need for more  tests. Vaccines  Your health care provider may recommend certain vaccines, such as:  Influenza vaccine. This is recommended every year.  Tetanus, diphtheria, and acellular pertussis (Tdap, Td) vaccine. You may need a Td booster every 10 years.  Zoster vaccine. You may need this after age 67.  Pneumococcal 13-valent conjugate (PCV13) vaccine. One dose is recommended after age 45.  Pneumococcal polysaccharide (PPSV23) vaccine. One dose is recommended after age 63. Talk to your health care provider about which screenings and vaccines you need and how often you need them. This information is not intended to replace advice given to you by your health care provider. Make sure you discuss any questions you have with your health care provider. Document Released: 07/11/2015 Document Revised: 03/03/2016 Document Reviewed: 04/15/2015 Elsevier Interactive Patient Education  2017 [place] Prevention in the Home Falls can cause injuries. They can happen to people of all ages. There are many things you can do to make your home safe and to help prevent falls. What can I do on the outside of my home?  Regularly fix the edges of walkways and driveways and fix any cracks.  Remove anything that might make you trip as you walk through a door, such as a raised step or threshold.  Trim any bushes or trees on the path to your home.  Use bright outdoor lighting.  Clear any walking paths of anything that might make someone trip, such as rocks or tools.  Regularly check to see if handrails are loose or broken. Make sure that both sides of any steps have handrails.  Any raised decks and porches should have guardrails on the edges.  Have any leaves, snow, or ice cleared regularly.  Use sand or salt on walking paths during winter.  Clean up any spills in your garage right away. This includes oil or grease spills. What can I do in the bathroom?  Use night lights.  Install grab bars by the  toilet and in the tub and shower. Do not use towel bars as grab bars.  Use non-skid mats or decals in the tub or shower.  If you need to sit down in the shower, use a plastic, non-slip stool.  Keep the floor dry. Clean up any water that spills on the floor as soon as it happens.  Remove soap buildup in the tub or shower regularly.  Attach bath mats securely with double-sided non-slip rug tape.  Do not have throw rugs and other things on the floor that can make you trip. What can I do in the bedroom?  Use night lights.  Make sure that you have a light by your bed that is easy to reach.  Do not use any sheets or blankets that are too big for your bed. They should not hang down onto the floor.  Have a firm chair that has side arms. You can use this for support while you get dressed.  Do not have throw rugs and other things on the floor that can make you trip. What can I do in the kitchen?  Clean up any spills right away.  Avoid walking on wet floors.  Keep items  that you use a lot in easy-to-reach places.  If you need to reach something above you, use a strong step stool that has a grab bar.  Keep electrical cords out of the way.  Do not use floor polish or wax that makes floors slippery. If you must use wax, use non-skid floor wax.  Do not have throw rugs and other things on the floor that can make you trip. What can I do with my stairs?  Do not leave any items on the stairs.  Make sure that there are handrails on both sides of the stairs and use them. Fix handrails that are broken or loose. Make sure that handrails are as long as the stairways.  Check any carpeting to make sure that it is firmly attached to the stairs. Fix any carpet that is loose or worn.  Avoid having throw rugs at the top or bottom of the stairs. If you do have throw rugs, attach them to the floor with carpet tape.  Make sure that you have a light switch at the top of the stairs and the bottom of  the stairs. If you do not have them, ask someone to add them for you. What else can I do to help prevent falls?  Wear shoes that:  Do not have high heels.  Have rubber bottoms.  Are comfortable and fit you well.  Are closed at the toe. Do not wear sandals.  If you use a stepladder:  Make sure that it is fully opened. Do not climb a closed stepladder.  Make sure that both sides of the stepladder are locked into place.  Ask someone to hold it for you, if possible.  Clearly mark and make sure that you can see:  Any grab bars or handrails.  First and last steps.  Where the edge of each step is.  Use tools that help you move around (mobility aids) if they are needed. These include:  Canes.  Walkers.  Scooters.  Crutches.  Turn on the lights when you go into a dark area. Replace any light bulbs as soon as they burn out.  Set up your furniture so you have a clear path. Avoid moving your furniture around.  If any of your floors are uneven, fix them.  If there are any pets around you, be aware of where they are.  Review your medicines with your doctor. Some medicines can make you feel dizzy. This can increase your chance of falling. Ask your doctor what other things that you can do to help prevent falls. This information is not intended to replace advice given to you by your health care provider. Make sure you discuss any questions you have with your health care provider. Document Released: 04/10/2009 Document Revised: 11/20/2015 Document Reviewed: 07/19/2014 Elsevier Interactive Patient Education  2017 [employer].

## 2018-02-03 DIAGNOSIS — R944 Abnormal results of kidney function studies: Secondary | ICD-10-CM | POA: Diagnosis not present

## 2018-02-03 LAB — BASIC METABOLIC PANEL WITHOUT GFR
BUN/Creatinine Ratio: 13 (calc) (ref 6–22)
BUN: 16 mg/dL (ref 7–25)
CO2: 28 mmol/L (ref 20–32)
Calcium: 9.5 mg/dL (ref 8.6–10.4)
Chloride: 105 mmol/L (ref 98–110)
Creat: 1.22 mg/dL — ABNORMAL HIGH (ref 0.60–0.93)
GFR, Est African American: 51 mL/min/{1.73_m2} — ABNORMAL LOW
GFR, Est Non African American: 44 mL/min/{1.73_m2} — ABNORMAL LOW
Glucose, Bld: 88 mg/dL (ref 65–139)
Potassium: 4.2 mmol/L (ref 3.5–5.3)
Sodium: 141 mmol/L (ref 135–146)

## 2018-02-07 DIAGNOSIS — I729 Aneurysm of unspecified site: Secondary | ICD-10-CM | POA: Diagnosis not present

## 2018-02-07 DIAGNOSIS — Z8673 Personal history of transient ischemic attack (TIA), and cerebral infarction without residual deficits: Secondary | ICD-10-CM | POA: Diagnosis not present

## 2018-02-07 DIAGNOSIS — I672 Cerebral atherosclerosis: Secondary | ICD-10-CM | POA: Diagnosis not present

## 2018-02-08 DIAGNOSIS — I729 Aneurysm of unspecified site: Secondary | ICD-10-CM | POA: Insufficient documentation

## 2018-02-17 ENCOUNTER — Encounter: Payer: Self-pay | Admitting: Cardiovascular Disease

## 2018-02-17 ENCOUNTER — Ambulatory Visit (INDEPENDENT_AMBULATORY_CARE_PROVIDER_SITE_OTHER): Payer: Medicare Other | Admitting: Cardiovascular Disease

## 2018-02-17 VITALS — BP 138/78 | HR 60 | Ht 63.0 in | Wt 178.5 lb

## 2018-02-17 DIAGNOSIS — R42 Dizziness and giddiness: Secondary | ICD-10-CM

## 2018-02-17 DIAGNOSIS — I10 Essential (primary) hypertension: Secondary | ICD-10-CM | POA: Diagnosis not present

## 2018-02-17 DIAGNOSIS — E785 Hyperlipidemia, unspecified: Secondary | ICD-10-CM | POA: Diagnosis not present

## 2018-02-17 DIAGNOSIS — I480 Paroxysmal atrial fibrillation: Secondary | ICD-10-CM | POA: Diagnosis not present

## 2018-02-17 NOTE — Progress Notes (Signed)
Cardiology Office Note   Date:  02/17/2018   ID:  [name], DOB 03-06-1941, MRN [id number]  PCP:  Steele Sizer, MD  Cardiologist:   Kathlyn Sacramento, MD   Chief Complaint  Patient presents with  . Other    Past due 6 month follow up. Patient denies chest pain, SOB, and swelling at this time. Patient was recently in the hospital. Patient Patient was last seen 12/09/2016. Meds reviewed verbally with patient.       History of Present Illness: [name] is a 77 y.o. female who presents for a follow-up visit regarding paroxysmal atrial fibrillation.  Atrial fibrillation happened in the past in the setting of severe vertigo and it was felt to be vagally mediated.  She was hospitalized in July with spasms involving her left hand.  MRI of the brain showed no evidence of acute stroke.  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  She had no eye symptoms.  She had an echocardiogram done that showed normal LV systolic function with no cardiac source of emboli.  Carotid Doppler showed mild nonobstructive disease.  She has been doing well with no recent chest pain, shortness of breath or palpitations.  Past Medical History:  Diagnosis Date  . History of stress test    a. 02/2013 Nl stress test.  . Hypertension   . Insomnia, unspecified   . Mitral regurgitation    a. echo 01/2016: nl LV sys fxn, mild MR, w/o pulm htn, nl atrial size  . Osteoporosis, unspecified   . Paroxysmal atrial fibrillation ([place]) 01/2016   a. diagnosed 01/2016; b. has been on eliquis, pradaxa, and xarelto at varying times 2/2 cost of each medication-->currently on eliquis (11/2016); c. CHADS2VASc --> 3 (HTN, age x 1, female).  . Pure hypercholesterolemia   . TIA (transient ischemic attack)   . Unspecified asthma(493.90)   . Unspecified transient cerebral ischemia   . Vertigo     Past Surgical History:  Procedure Laterality Date  . ABDOMINAL HYSTERECTOMY    . CATARACT EXTRACTION  Right 08/31/2016  . COLONOSCOPY WITH PROPOFOL N/A 03/04/2015   Procedure: COLONOSCOPY WITH PROPOFOL;  Surgeon: Lucilla Lame, MD;  Location: [facility] ENDOSCOPY;  Service: Endoscopy;  Laterality: N/A;     Current Outpatient Medications  Medication Sig Dispense Refill  . amLODipine (NORVASC) 5 MG tablet TAKE 1 TABLET BY MOUTH EVERY DAY 90 tablet 1  . atorvastatin (LIPITOR) 40 MG tablet Take 1 tablet (40 mg total) by mouth daily at 6 PM. 90 tablet 0  . diltiazem (CARDIZEM) 30 MG tablet Take 1 tablet (30 mg total) by mouth every 6 (six) hours as needed (for tachycardia/recurrent Afib.). 30 tablet 3  . ELIQUIS 5 MG TABS tablet TAKE 1 TABLET(5 MG) BY MOUTH TWICE DAILY 60 tablet 3  . meclizine (ANTIVERT) 25 MG tablet TAKE 1 TABLET(25 MG) BY MOUTH EVERY 8 HOURS AS NEEDED 30 tablet 0   No current facility-administered medications for this visit.     Allergies:   Ace inhibitors and Penicillins    Social History:  The patient  reports that she has never smoked. She has never used smokeless tobacco. She reports that she does not drink alcohol or use drugs.   Family History:  The patient's family history includes Aneurysm in her mother; COPD in her father; Cancer in her brother; Healthy in her brother; Hypertension in her brother and mother.    ROS:  Please see the history of present illness.  Otherwise, review of systems are positive for none.   All other systems are reviewed and negative.    PHYSICAL EXAM: VS:  BP 138/78 (BP Location: Left Arm, Patient Position: Sitting, Cuff Size: Normal)   Pulse 60   Ht 5\' 3"  (1.6 m)   Wt 178 lb 8 oz (81 kg)   BMI 31.62 kg/m  , BMI Body mass index is 31.62 kg/m. GEN: Well nourished, well developed, in no acute distress  HEENT: normal  Neck: no JVD, carotid bruits, or masses Cardiac: RRR; no murmurs, rubs, or gallops,no edema  Respiratory:  clear to auscultation bilaterally, normal work of breathing GI: soft, nontender, nondistended, + BS MS: no deformity or  atrophy  Skin: warm and dry, no rash Neuro:  Strength and sensation are intact Psych: euthymic mood, full affect   EKG:  EKG is ordered today. EKG showed normal sinus rhythm with moderate LVH.  Recent Labs: 10/17/2017: ALT 18 01/23/2018: Hemoglobin 13.1; Platelets 267 02/03/2018: BUN 16; Creat 1.22; Potassium 4.2; Sodium 141    Lipid Panel    Component Value Date/Time   CHOL 153 01/22/2018 1154   CHOL 172 02/28/2015 1131   TRIG 36 01/22/2018 1154   HDL 40 (L) 01/22/2018 1154   CHOLHDL 3.8 01/22/2018 1154   VLDL 7 01/22/2018 1154   LDLCALC 106 (H) 01/22/2018 1154   LDLCALC 134 (H) 10/17/2017 0829      Wt Readings from Last 3 Encounters:  02/17/18 178 lb 8 oz (81 kg)  01/24/18 179 lb 4.8 oz (81.3 kg)  01/24/18 179 lb 4.8 oz (81.3 kg)        PAD Screen 02/23/2016  Previous PAD dx? No  Previous surgical procedure? No  Pain with walking? No  Feet/toe relief with dangling? No  Painful, non-healing ulcers? No  Extremities discolored? No      ASSESSMENT AND PLAN:  1.   Paroxysmal atrial fibrillation: She is maintaining in sinus rhythm with no symptomatic episodes over the last year.  She has not required using diltiazem.  If her recent hospitalization in July was due to TIA, then her chads vas score is now up to 5.  I recommend continuing long-term anticoagulation with Eliquis   2. Essential hypertension: Blood pressure is controlled on current medications.  3. Possible benign positional vertigo.  Seems to be overall controlled.  4.  Hyperlipidemia: I agree with atorvastatin given her recent TIA.   Disposition:   FU with me in 12 months  Signed,  Kathlyn Sacramento, MD  02/17/2018 2:29 PM    [place]

## 2018-02-17 NOTE — Patient Instructions (Signed)
Medication Instructions: Your physician recommends that you continue on your current medications as directed. Please refer to the Current Medication list given to you today.  If you need a refill on your cardiac medications before your next appointment, please call your pharmacy.   Follow-Up: Your physician wants you to follow-up in 12 months with Dr. Arida. You will receive a reminder letter in the mail two months in advance. If you don't receive a letter, please call our office at [phone] to schedule this follow-up appointment.  Thank you for choosing [facility] [place]!    

## 2018-04-10 ENCOUNTER — Telehealth: Payer: Self-pay | Admitting: Family Medicine

## 2018-04-10 NOTE — Telephone Encounter (Signed)
Copied from [place] [id number]. Topic: Quick Communication - See Telephone Encounter >> Apr 10, 2018 11:24 AM Blase Mess A wrote: CRM for notification. See Telephone encounter for: 04/10/18.  Patient rescheduled her appt for 04/28/18 However, Patient wanted to let Dr. Ancil Boozer know that she is experiencing hair loss from the atorvastatin (LIPITOR) 40 MG tablet [[id number] ENDED  Please advise

## 2018-04-11 NOTE — Telephone Encounter (Signed)
Can we change to Crestor?

## 2018-04-11 NOTE — Telephone Encounter (Signed)
I tried to reach this patient to see if she would like to switch but there was no answer. A message was left for her to give [place] a call back to let us know if we could switch from the Atorvastatin to the rousvastatin and if she still uses [facility] in [place].

## 2018-04-12 MED ORDER — ROSUVASTATIN CALCIUM 10 MG PO TABS: 10.0000 mg | ORAL_TABLET | Freq: Every day | ORAL | 0 refills | Status: DC

## 2018-04-12 NOTE — Telephone Encounter (Signed)
I changed to crestor and sent to [employer].

## 2018-04-17 ENCOUNTER — Other Ambulatory Visit: Payer: Self-pay | Admitting: Family Medicine

## 2018-04-17 DIAGNOSIS — I10 Essential (primary) hypertension: Secondary | ICD-10-CM

## 2018-04-18 ENCOUNTER — Ambulatory Visit: Payer: Medicare Other | Admitting: Family Medicine

## 2018-04-28 ENCOUNTER — Encounter: Payer: Self-pay | Admitting: Family Medicine

## 2018-04-28 ENCOUNTER — Ambulatory Visit (INDEPENDENT_AMBULATORY_CARE_PROVIDER_SITE_OTHER): Payer: Medicare Other | Admitting: Family Medicine

## 2018-04-28 VITALS — BP 122/78 | HR 76 | Temp 98.1°F | Ht 63.0 in | Wt 177.7 lb

## 2018-04-28 DIAGNOSIS — N183 Chronic kidney disease, stage 3 unspecified: Secondary | ICD-10-CM

## 2018-04-28 DIAGNOSIS — I10 Essential (primary) hypertension: Secondary | ICD-10-CM

## 2018-04-28 DIAGNOSIS — Z8673 Personal history of transient ischemic attack (TIA), and cerebral infarction without residual deficits: Secondary | ICD-10-CM | POA: Diagnosis not present

## 2018-04-28 DIAGNOSIS — E782 Mixed hyperlipidemia: Secondary | ICD-10-CM | POA: Diagnosis not present

## 2018-04-28 DIAGNOSIS — J069 Acute upper respiratory infection, unspecified: Secondary | ICD-10-CM | POA: Diagnosis not present

## 2018-04-28 DIAGNOSIS — I729 Aneurysm of unspecified site: Secondary | ICD-10-CM | POA: Diagnosis not present

## 2018-04-28 DIAGNOSIS — R7303 Prediabetes: Secondary | ICD-10-CM

## 2018-04-28 DIAGNOSIS — I480 Paroxysmal atrial fibrillation: Secondary | ICD-10-CM

## 2018-04-28 MED ORDER — ROSUVASTATIN CALCIUM 10 MG PO TABS: 10.0000 mg | ORAL_TABLET | Freq: Every day | ORAL | 0 refills | Status: DC

## 2018-04-28 NOTE — Progress Notes (Signed)
Name: [name]   MRN: [id number]    DOB: 1941/01/09   Date:04/28/2018       Progress Note  Subjective  Chief Complaint  Chief Complaint  Patient presents with  . Follow-up    HPI  HTN:she is now on norvasc, she denies chest pain or palpitation. She also denies dizziness.   Pre-diabetes: hgb A1C was 6.1% Denies polyphagia, polydipsia or polyuria . She has been trying to avoid carbs  Vertigo: she has vertigo with head movement, motion sickness, and when she has an episode she needs to go to [facility] by EMS because of severe nausea, vomiting and diarrhea01/2018. She has seen ENT but no recent visits. She went back to [facility] 09/2016, taking Meclizine prn and it helps with symptoms. Unchanged   Paroxysmal Afib: sees Dr. Fletcher Anon, symptoms started August 2017, she is onEliquisand prn Cardizem. No chest painvery seldom has fluttering sensation in her chest very seldom now. No side effects of medication. Unchanged  History of CVA/TIA and also aneursym of left ophthalmic artery , under the care of Dr. Manuella Ghazi. She stopped crestor on her own because she has been worried about side effect. Explained importance of taking statin therapy and she is willing to try again  Asthma: she states she was given Symbicort in the past, she has intermittent wheezing, that is present when she is on right lateral decubitus ( she has a history of GERD, and likely the trigger ), she has been off medication, discussed rescue inhaler, but she states she does not want one at this time. She has clear rhinorrhea and nasal congestion with some sneezing, but states not causing cough or wheezing   Neck pain: she noticed that she has upper shoulder, neck pain , going on for the past 2-3 months,worse at night, at times has a painful sensation inside the right 5th finger. She saw ortho, had steroid injection but symptoms are back, advised to take Tylenol and follow up with Ortho   Dyslipidemia: she needs to resume statin and  repeat labs next visit   CKI: she has not been taking nsaid's, GFR with slight drop, recheck next visit and consider ARB/ACE  Patient Active Problem List   Diagnosis Date Noted  . Aneurysm ([place]) 02/08/2018  . History of CVA (cerebrovascular accident) without residual deficits 01/24/2018  . 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 TIA (transient ischemic attack) 01/22/2018  . Impingement syndrome of shoulder region 01/02/2018  . History of cataract surgery, right 09/01/2016  . Paroxysmal atrial fibrillation ([place]) 02/24/2016  . Vertigo 02/24/2016  . Low HDL (under 40) 02/17/2016  . Dizziness 02/15/2016  . Hx of colonic polyps   . Benign neoplasm of sigmoid colon   . Asthma, mild intermittent 02/21/2015  . Hyperglycemia 02/21/2015  . Essential hypertension 02/20/2015  . Inconclusive mammogram due to dense breasts 01/02/2015  . Dense breast 01/02/2015  . Abnormal EKG 02/23/2013    Past Surgical History:  Procedure Laterality Date  . ABDOMINAL HYSTERECTOMY    . CATARACT EXTRACTION Right 08/31/2016  . COLONOSCOPY WITH PROPOFOL N/A 03/04/2015   Procedure: COLONOSCOPY WITH PROPOFOL;  Surgeon: Lucilla Lame, MD;  Location: [facility] ENDOSCOPY;  Service: Endoscopy;  Laterality: N/A;    Family History  Problem Relation Age of Onset  . COPD Father   . Hypertension Mother   . Aneurysm Mother   . Hypertension Brother   . Cancer Brother        pancreatic  . Healthy Brother  Social History   Socioeconomic History  . Marital status: Single    Spouse name: Not on file  . Number of children: 2  . Years of education: Not on file  . Highest education level: Associate degree: academic program  Occupational History  . Occupation: Retired  Scientific laboratory technician  . Financial resource strain: Not hard at all  . Food insecurity:    Worry: Never true    Inability: Never true  . Transportation needs:    Medical: No    Non-medical: No  Tobacco Use  . Smoking status: Never Smoker  . Smokeless  tobacco: Never Used  . Tobacco comment: smoking cessation materials not required  Substance and Sexual Activity  . Alcohol use: No    Alcohol/week: 0.0 standard drinks  . Drug use: No  . Sexual activity: Not Currently  Lifestyle  . Physical activity:    Days per week: 3 days    Minutes per session: 30 min  . Stress: Not at all  Relationships  . Social connections:    Talks on phone: Patient refused    Gets together: Patient refused    Attends religious service: Patient refused    Active member of club or organization: Patient refused    Attends meetings of clubs or organizations: Patient refused    Relationship status: Patient refused  . Intimate partner violence:    Fear of current or ex partner: No    Emotionally abused: No    Physically abused: No    Forced sexual activity: No  Other Topics Concern  . Not on file  Social History Narrative   Independent at baseline. Lives at home with family     Current Outpatient Medications:  .  amLODipine (NORVASC) 5 MG tablet, TAKE 1 TABLET BY MOUTH EVERY DAY, Disp: 90 tablet, Rfl: 0 .  diltiazem (CARDIZEM) 30 MG tablet, Take 1 tablet (30 mg total) by mouth every 6 (six) hours as needed (for tachycardia/recurrent Afib.)., Disp: 30 tablet, Rfl: 3 .  ELIQUIS 5 MG TABS tablet, TAKE 1 TABLET(5 MG) BY MOUTH TWICE DAILY, Disp: 60 tablet, Rfl: 3 .  meclizine (ANTIVERT) 25 MG tablet, TAKE 1 TABLET(25 MG) BY MOUTH EVERY 8 HOURS AS NEEDED, Disp: 30 tablet, Rfl: 0 .  rosuvastatin (CRESTOR) 10 MG tablet, Take 1 tablet (10 mg total) by mouth daily. In place of atorvastatin, Disp: 90 tablet, Rfl: 0  Allergies  Allergen Reactions  . Ace Inhibitors Swelling    Patient states her tongue swell.  [name] Penicillins Anxiety    Has patient had a PCN reaction causing immediate rash, facial/tongue/throat swelling, SOB or lightheadedness with hypotension: No Has patient had a PCN reaction causing severe rash involving mucus membranes or skin necrosis: No Has  patient had a PCN reaction that required hospitalization No Has patient had a PCN reaction occurring within the last 10 years: No If all of the above answers are "NO", then may proceed with Cephalosporin use.    I personally reviewed active problem list, medication list, allergies, family history, social history with the patient/caregiver today.   ROS  Constitutional: Negative for fever or weight change.  Respiratory: Negative for cough and shortness of breath.   Cardiovascular: Negative for chest pain or palpitations.  Gastrointestinal: Negative for abdominal pain, no bowel changes.  Musculoskeletal: Negative for gait problem or joint swelling.  Skin: Negative for rash.  Neurological: Negative for dizziness or headache.  No other specific complaints in a complete review of systems (except as listed  in HPI above).  Objective  Vitals:   04/28/18 1010  BP: 122/78  Pulse: 76  Temp: 98.1 F (36.7 C)  SpO2: 97%  Weight: 177 lb 11.2 oz (80.6 kg)  Height: 5\' 3"  (1.6 m)    Body mass index is 31.48 kg/m.  Physical Exam  Constitutional: Patient appears well-developed and well-nourished. Obese No distress.  HEENT: head atraumatic, normocephalic, pupils equal and reactive to light, eck supple, throat within normal limits Cardiovascular: Normal rate, regular rhythm and normal heart sounds.  No murmur heard. No BLE edema.  Pulmonary/Chest: Effort normal and breath sounds normal. No respiratory distress. Abdominal: Soft.  There is no tenderness. Psychiatric: Patient has a normal mood and affect. behavior is normal. Judgment and thought content normal.  Recent Results (from the past 2160 hour(s))  BASIC METABOLIC PANEL WITH GFR     Status: Abnormal   Collection Time: 02/03/18 11:40 AM  Result Value Ref Range   Glucose, Bld 88 65 - 139 mg/dL    Comment: .        Non-fasting reference interval .    BUN 16 7 - 25 mg/dL   Creat 1.22 (H) 0.60 - 0.93 mg/dL    Comment: For patients  >38 years of age, the reference limit for Creatinine is approximately 13% higher for people identified as African-American. .    GFR, Est Non African American 44 (L) > OR = 60 mL/min/1.35m2   GFR, Est African American 51 (L) > OR = 60 mL/min/1.92m2   BUN/Creatinine Ratio 13 6 - 22 (calc)   Sodium 141 135 - 146 mmol/L   Potassium 4.2 3.5 - 5.3 mmol/L   Chloride 105 98 - 110 mmol/L   CO2 28 20 - 32 mmol/L   Calcium 9.5 8.6 - 10.4 mg/dL     PHQ2/9: Depression screen [facility] 2/9 04/28/2018 01/24/2018 01/03/2017 09/01/2016 08/17/2016  Decreased Interest 0 0 0 0 0  Down, Depressed, Hopeless 0 0 0 0 0  PHQ - 2 Score 0 0 0 0 0  Altered sleeping - 0 - - -  Tired, decreased energy - 0 - - -  Change in appetite - 0 - - -  Feeling bad or failure about yourself  - 0 - - -  Trouble concentrating - 0 - - -  Moving slowly or fidgety/restless - 0 - - -  Suicidal thoughts - 0 - - -  PHQ-9 Score - 0 - - -  Difficult doing work/chores - Not difficult at all - - -     Fall Risk: Fall Risk  04/28/2018 01/24/2018 12/26/2017 10/17/2017 05/11/2017  Falls in the past year? 0 No No No No  Risk for fall due to : - Impaired vision;Other (Comment) - - -  Risk for fall due to: Comment - wears eyeglasses; TIA - - -      Functional Status Survey: Is the patient deaf or have difficulty hearing?: No Does the patient have difficulty seeing, even when wearing glasses/contacts?: No Does the patient have difficulty concentrating, remembering, or making decisions?: No Does the patient have difficulty walking or climbing stairs?: No Does the patient have difficulty dressing or bathing?: No Does the patient have difficulty doing errands alone such as visiting a doctor's office or shopping?: No    Assessment & Plan  1. Aneurysm [facility])  Of opthalmic artery, under the care of Dr. Manuella Ghazi   2. History of CVA (cerebrovascular accident) without residual deficits  - rosuvastatin (CRESTOR) 10 MG tablet; Take  1 tablet (10 mg  total) by mouth daily. In place of atorvastatin  Dispense: 90 tablet; Refill: 0  3. Paroxysmal atrial fibrillation (HCC)  Rate controlled   4. History of TIA (transient ischemic attack)   5. Essential hypertension  At goal, continue medication   6. Pre-diabetes   7. URI, acute  Advised nasal saline , rest   8. Mixed hyperlipidemia  She states she stopped Crestor because same class of atorvastatin and did not want the side effects of hair loss, googled today statins and hair loss and stated crestor not likely to cause that and she is willing to resume medication  - rosuvastatin (CRESTOR) 10 MG tablet; Take 1 tablet (10 mg total) by mouth daily. In place of atorvastatin  Dispense: 90 tablet; Refill: 0  9. Chronic kidney disease, stage III (moderate) (HCC)  We will monitor , recheck next visit and if still below 60 we will add ARB Advised to avoid nsaid's

## 2018-05-04 ENCOUNTER — Other Ambulatory Visit: Payer: Self-pay | Admitting: Family Medicine

## 2018-05-04 ENCOUNTER — Ambulatory Visit (INDEPENDENT_AMBULATORY_CARE_PROVIDER_SITE_OTHER): Payer: Medicare Other

## 2018-05-04 DIAGNOSIS — Z8673 Personal history of transient ischemic attack (TIA), and cerebral infarction without residual deficits: Secondary | ICD-10-CM

## 2018-05-04 DIAGNOSIS — Z23 Encounter for immunization: Secondary | ICD-10-CM | POA: Diagnosis not present

## 2018-05-12 ENCOUNTER — Other Ambulatory Visit: Payer: Self-pay | Admitting: Physician Assistant

## 2018-05-15 NOTE — Telephone Encounter (Signed)
Refill Request.  

## 2018-05-19 DIAGNOSIS — G4733 Obstructive sleep apnea (adult) (pediatric): Secondary | ICD-10-CM | POA: Diagnosis not present

## 2018-05-19 DIAGNOSIS — I672 Cerebral atherosclerosis: Secondary | ICD-10-CM | POA: Diagnosis not present

## 2018-05-19 DIAGNOSIS — I729 Aneurysm of unspecified site: Secondary | ICD-10-CM | POA: Diagnosis not present

## 2018-06-01 DIAGNOSIS — H029 Unspecified disorder of eyelid: Secondary | ICD-10-CM | POA: Diagnosis not present

## 2018-06-06 DIAGNOSIS — R0602 Shortness of breath: Secondary | ICD-10-CM | POA: Diagnosis not present

## 2018-06-06 DIAGNOSIS — G4733 Obstructive sleep apnea (adult) (pediatric): Secondary | ICD-10-CM | POA: Diagnosis not present

## 2018-06-07 DIAGNOSIS — G4733 Obstructive sleep apnea (adult) (pediatric): Secondary | ICD-10-CM | POA: Diagnosis not present

## 2018-06-07 DIAGNOSIS — R0602 Shortness of breath: Secondary | ICD-10-CM | POA: Diagnosis not present

## 2018-06-08 ENCOUNTER — Ambulatory Visit
Admission: RE | Admit: 2018-06-08 | Discharge: 2018-06-08 | Disposition: A | Discharge status: Home / Self Care | Payer: Medicare Other | Source: Ambulatory Visit | Attending: Family Medicine | Admitting: Family Medicine

## 2018-06-08 DIAGNOSIS — Z13820 Encounter for screening for osteoporosis: Secondary | ICD-10-CM | POA: Diagnosis not present

## 2018-06-08 DIAGNOSIS — Z780 Asymptomatic menopausal state: Secondary | ICD-10-CM | POA: Diagnosis not present

## 2018-06-08 DIAGNOSIS — E2839 Other primary ovarian failure: Secondary | ICD-10-CM | POA: Diagnosis not present

## 2018-06-12 ENCOUNTER — Encounter: Payer: Self-pay | Admitting: Nurse Practitioner

## 2018-06-12 ENCOUNTER — Ambulatory Visit
Admission: RE | Admit: 2018-06-12 | Discharge: 2018-06-12 | Disposition: A | Discharge status: Home / Self Care | Payer: Medicare Other | Attending: Nurse Practitioner | Admitting: Nurse Practitioner

## 2018-06-12 ENCOUNTER — Ambulatory Visit (INDEPENDENT_AMBULATORY_CARE_PROVIDER_SITE_OTHER): Payer: Medicare Other | Admitting: Nurse Practitioner

## 2018-06-12 ENCOUNTER — Ambulatory Visit
Admission: RE | Admit: 2018-06-12 | Discharge: 2018-06-12 | Disposition: A | Discharge status: Home / Self Care | Payer: Medicare Other | Source: Ambulatory Visit | Attending: Nurse Practitioner | Admitting: Nurse Practitioner

## 2018-06-12 VITALS — BP 124/70 | HR 81 | Temp 97.8°F | Resp 18 | Ht 63.0 in | Wt 175.1 lb

## 2018-06-12 DIAGNOSIS — R0602 Shortness of breath: Secondary | ICD-10-CM | POA: Insufficient documentation

## 2018-06-12 DIAGNOSIS — R002 Palpitations: Secondary | ICD-10-CM | POA: Diagnosis not present

## 2018-06-12 DIAGNOSIS — I480 Paroxysmal atrial fibrillation: Secondary | ICD-10-CM

## 2018-06-12 DIAGNOSIS — R059 Cough, unspecified: Secondary | ICD-10-CM

## 2018-06-12 DIAGNOSIS — J029 Acute pharyngitis, unspecified: Secondary | ICD-10-CM

## 2018-06-12 DIAGNOSIS — R05 Cough: Secondary | ICD-10-CM

## 2018-06-12 DIAGNOSIS — R82998 Other abnormal findings in urine: Secondary | ICD-10-CM

## 2018-06-12 DIAGNOSIS — R6883 Chills (without fever): Secondary | ICD-10-CM

## 2018-06-12 LAB — CBC
HCT: 43.1 % (ref 35.0–45.0)
Hemoglobin: 14.4 g/dL (ref 11.7–15.5)
MCH: 30.1 pg (ref 27.0–33.0)
MCHC: 33.4 g/dL (ref 32.0–36.0)
MCV: 90 fL (ref 80.0–100.0)
MPV: 11.1 fL (ref 7.5–12.5)
Platelets: 385 10*3/uL (ref 140–400)
RBC: 4.79 Million/uL (ref 3.80–5.10)
RDW: 12 % (ref 11.0–15.0)
WBC: 9.7 10*3/uL (ref 3.8–10.8)

## 2018-06-12 LAB — COMPLETE METABOLIC PANEL WITHOUT GFR
AG Ratio: 1.3 (calc) (ref 1.0–2.5)
ALT: 19 U/L (ref 6–29)
AST: 17 U/L (ref 10–35)
Albumin: 4.2 g/dL (ref 3.6–5.1)
Alkaline phosphatase (APISO): 76 U/L (ref 33–130)
BUN/Creatinine Ratio: 18 (calc) (ref 6–22)
BUN: 17 mg/dL (ref 7–25)
CO2: 28 mmol/L (ref 20–32)
Calcium: 10.1 mg/dL (ref 8.6–10.4)
Chloride: 106 mmol/L (ref 98–110)
Creat: 0.94 mg/dL — ABNORMAL HIGH (ref 0.60–0.93)
GFR, Est African American: 69 mL/min/{1.73_m2}
GFR, Est Non African American: 59 mL/min/{1.73_m2} — ABNORMAL LOW
Globulin: 3.3 g/dL (ref 1.9–3.7)
Glucose, Bld: 101 mg/dL (ref 65–139)
Potassium: 4 mmol/L (ref 3.5–5.3)
Sodium: 141 mmol/L (ref 135–146)
Total Bilirubin: 0.5 mg/dL (ref 0.2–1.2)
Total Protein: 7.5 g/dL (ref 6.1–8.1)

## 2018-06-12 LAB — POCT URINALYSIS DIPSTICK
Appearance: NORMAL
Bilirubin, UA: NEGATIVE
Blood, UA: NEGATIVE
Glucose, UA: NEGATIVE
Ketones, UA: NEGATIVE
Nitrite, UA: NEGATIVE
Protein, UA: NEGATIVE
Spec Grav, UA: 1.02
Urobilinogen, UA: 0.2 U/dL
pH, UA: 6

## 2018-06-12 MED ORDER — BENZONATATE 100 MG PO CAPS: 100.0000 mg | ORAL_CAPSULE | Freq: Three times a day (TID) | ORAL | 0 refills | Status: DC | PRN

## 2018-06-12 NOTE — Progress Notes (Signed)
Name: [name]   MRN: [id number]    DOB: 1941/02/20   Date:06/12/2018       Progress Note  Subjective  Chief Complaint  Chief Complaint  Patient presents with  . Cough    patient presents with sx (dry cough) since thursday. patient has been taking some Alka Seltzer cold plus and some cough drops.  . Fever  . Chills  . Sore Throat  . Nasal Congestion  . Shortness of Breath  . Wheezing    HPI  Patient initially thought she had a cold that started last Thursday states took alkasetzer cold with minimal relief. States noted she started to have increasing shortness of breath, wheezing worse at night and intermittent palpitations. She took her Cardizem PRN for palpitations the last 2 days. Endorses sore throat and cough, endorses chills, no dysuria, abdominal pain, nausea, vomiting, diarrhea. Endorses chronic body aches- no changes.   Patient Active Problem List   Diagnosis Date Noted  . Aneurysm ([place]) 02/08/2018  . History of CVA (cerebrovascular accident) without residual deficits 01/24/2018  . 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 TIA (transient ischemic attack) 01/22/2018  . Impingement syndrome of shoulder region 01/02/2018  . History of cataract surgery, right 09/01/2016  . Paroxysmal atrial fibrillation ([place]) 02/24/2016  . Vertigo 02/24/2016  . Low HDL (under 40) 02/17/2016  . Dizziness 02/15/2016  . Hx of colonic polyps   . Benign neoplasm of sigmoid colon   . Asthma, mild intermittent 02/21/2015  . Hyperglycemia 02/21/2015  . Essential hypertension 02/20/2015  . Inconclusive mammogram due to dense breasts 01/02/2015  . Dense breast 01/02/2015  . Abnormal EKG 02/23/2013    Past Medical History:  Diagnosis Date  . History of stress test    a. 02/2013 Nl stress test.  . Hypertension   . Insomnia, unspecified   . Mitral regurgitation    a. echo 01/2016: nl LV sys fxn, mild MR, w/o pulm htn, nl atrial size  . Osteoporosis, unspecified   . Paroxysmal  atrial fibrillation ([place]) 01/2016   a. diagnosed 01/2016; b. has been on eliquis, pradaxa, and xarelto at varying times 2/2 cost of each medication-->currently on eliquis (11/2016); c. CHADS2VASc --> 3 (HTN, age x 1, female).  . Pure hypercholesterolemia   . TIA (transient ischemic attack)   . Unspecified asthma(493.90)   . Unspecified transient cerebral ischemia   . Vertigo     Past Surgical History:  Procedure Laterality Date  . ABDOMINAL HYSTERECTOMY    . CATARACT EXTRACTION Right 08/31/2016  . COLONOSCOPY WITH PROPOFOL N/A 03/04/2015   Procedure: COLONOSCOPY WITH PROPOFOL;  Surgeon: Lucilla Lame, MD;  Location: [facility] ENDOSCOPY;  Service: Endoscopy;  Laterality: N/A;    Social History   Tobacco Use  . Smoking status: Never Smoker  . Smokeless tobacco: Never Used  . Tobacco comment: smoking cessation materials not required  Substance Use Topics  . Alcohol use: No    Alcohol/week: 0.0 standard drinks     Current Outpatient Medications:  .  amLODipine (NORVASC) 5 MG tablet, TAKE 1 TABLET BY MOUTH EVERY DAY, Disp: 90 tablet, Rfl: 0 .  diltiazem (CARDIZEM) 30 MG tablet, Take 1 tablet (30 mg total) by mouth every 6 (six) hours as needed (for tachycardia/recurrent Afib.)., Disp: 30 tablet, Rfl: 3 .  ELIQUIS 5 MG TABS tablet, TAKE 1 TABLET(5 MG) BY MOUTH TWICE DAILY, Disp: 60 tablet, Rfl: 6 .  rosuvastatin (CRESTOR) 10 MG tablet, Take 1 tablet (10 mg total) by mouth daily.  In place of atorvastatin, Disp: 90 tablet, Rfl: 0 .  meclizine (ANTIVERT) 25 MG tablet, TAKE 1 TABLET(25 MG) BY MOUTH EVERY 8 HOURS AS NEEDED (Patient not taking: Reported on 06/12/2018), Disp: 30 tablet, Rfl: 0  Allergies  Allergen Reactions  . Ace Inhibitors Swelling    Patient states her tongue swell.  [name] Penicillins Anxiety    Has patient had a PCN reaction causing immediate rash, facial/tongue/throat swelling, SOB or lightheadedness with hypotension: No Has patient had a PCN reaction causing severe rash involving  mucus membranes or skin necrosis: No Has patient had a PCN reaction that required hospitalization No Has patient had a PCN reaction occurring within the last 10 years: No If all of the above answers are "NO", then may proceed with Cephalosporin use.    ROS  No other specific complaints in a complete review of systems (except as listed in HPI above).  Objective  Vitals:   06/12/18 0935  BP: 124/70  Pulse: 81  Resp: 18  Temp: 97.8 F (36.6 C)  TempSrc: Oral  SpO2: 97%  Weight: 175 lb 1.6 oz (79.4 kg)  Height: 5\' 3"  (1.6 m)    Body mass index is 31.02 kg/m.  Nursing Note and Vital Signs reviewed.  Physical Exam Vitals signs reviewed.  Constitutional:      General: She is not in acute distress.    Appearance: She is well-developed. She is ill-appearing (wearing multiple layers).  HENT:     Head: Normocephalic and atraumatic.     Right Ear: Tympanic membrane and ear canal normal.     Left Ear: Ear canal normal.     Mouth/Throat:     Mouth: Mucous membranes are dry.     Pharynx: Oropharynx is clear. Uvula midline.  Eyes:     Conjunctiva/sclera: Conjunctivae normal.  Neck:     Musculoskeletal: Normal range of motion and neck supple.     Vascular: No carotid bruit.  Cardiovascular:     Rate and Rhythm: Normal rate and regular rhythm.     Heart sounds: Normal heart sounds.  Pulmonary:     Effort: Pulmonary effort is normal. No respiratory distress.     Breath sounds: Normal breath sounds.  Chest:     Chest wall: No tenderness.  Abdominal:     General: Bowel sounds are normal.     Palpations: Abdomen is soft.     Tenderness: There is no abdominal tenderness.  Musculoskeletal: Normal range of motion.  Skin:    General: Skin is warm and dry.     Capillary Refill: Capillary refill takes less than 2 seconds.  Neurological:     Mental Status: She is alert and oriented to person, place, and time.     GCS: GCS eye subscore is 4. GCS verbal subscore is 5. GCS motor  subscore is 6.     Sensory: No sensory deficit.  Psychiatric:        Mood and Affect: Mood normal.        Speech: Speech normal.        Behavior: Behavior normal.        Thought Content: Thought content normal.        Judgment: Judgment normal.      No results found for this or any previous visit (from the past 48 hour(s)).  Assessment & Plan  1. Palpitation Hydration, otc meds, likely paroxymal afib triggered by URI, rule out other infection like UTI - COMPLETE METABOLIC PANEL WITH GFR - CBC -  POCT Urinalysis Dipstick  2. Shortness of breath Humidified air, consider abx possible pneumonia  - DG Chest 2 View; Future - COMPLETE METABOLIC PANEL WITH GFR - CBC  3. Cough - DG Chest 2 View; Future - benzonatate (TESSALON PERLES) 100 MG capsule; Take 1 capsule (100 mg total) by mouth 3 (three) times daily as needed for cough.  Dispense: 30 capsule; Refill: 0  4. Sore throat Honey lemon teas   5. Chills - POCT Urinalysis Dipstick  6. Leukocytes in urine - Urine Culture  7. Paroxysmal atrial fibrillation (HCC) Not present at this time, likely converted after taking PRN cardizem; close follow up due to comorbidity   -Red flags and when to present for emergency care or RTC including fever >101.35F, chest pain, shortness of breath, new/worsening/un-resolving symptoms,  reviewed with patient at time of visit. Follow up and care instructions discussed and provided in AVS.

## 2018-06-12 NOTE — Patient Instructions (Addendum)
-   Please drink lots of water at least 8 glasses a day  - Take prescription cough medicine and rest  You likely have a viral upper respiratory infection (URI). Antibiotics will not reduce the number of days you are ill or prevent you from getting bacterial rhinosinusitis. A URI can take up to 14 days to resolve, but typically last between 7-11 days. Your body is so smart and strong that it will be fighting this illness off for you but it is important that you drink plenty of fluids, rest. Cover your nose/mouth when you cough or sneeze and wash your hands well and often. Here are some helpful things you can use or pick up over the counter from the pharmacy to help with your symptoms:   For Fever/Pain: Acetaminophen every 6 hours as needed (maximum of 3000mg  a day).  For coughing: try dextromethorphan for a cough suppressant, and/or a cool mist humidifier, lozenges  For sore throat: saline gargles, honey herbal tea, lozenges, throat spray  To dry out your nose: try an antihistamine like loratadine (non-sedating) or diphenhydramine (sedating) or others To relieve a stuffy nose: try an oral decongestant  neti pot  To make blowing your nose easier: guaifenesin

## 2018-06-13 LAB — URINE CULTURE
MICRO NUMBER:: 91503013
SPECIMEN QUALITY:: ADEQUATE

## 2018-06-14 ENCOUNTER — Encounter: Payer: Self-pay | Admitting: Nurse Practitioner

## 2018-06-14 ENCOUNTER — Ambulatory Visit (INDEPENDENT_AMBULATORY_CARE_PROVIDER_SITE_OTHER): Payer: Medicare Other | Admitting: Nurse Practitioner

## 2018-06-14 VITALS — BP 136/72 | HR 78 | Temp 97.7°F | Resp 16 | Ht 63.0 in | Wt 177.6 lb

## 2018-06-14 DIAGNOSIS — R05 Cough: Secondary | ICD-10-CM | POA: Diagnosis not present

## 2018-06-14 DIAGNOSIS — J069 Acute upper respiratory infection, unspecified: Secondary | ICD-10-CM | POA: Diagnosis not present

## 2018-06-14 DIAGNOSIS — R059 Cough, unspecified: Secondary | ICD-10-CM

## 2018-06-14 DIAGNOSIS — J029 Acute pharyngitis, unspecified: Secondary | ICD-10-CM

## 2018-06-14 MED ORDER — MAGIC MOUTHWASH W/LIDOCAINE: 5.0000 mL | Freq: Four times a day (QID) | ORAL | 0 refills | Status: DC | PRN

## 2018-06-14 NOTE — Patient Instructions (Addendum)
-   Please take guaifenesen 400-600mg  twice a day for the next 3 days and then just as needed - Please take benzonatate three times a day for the next 3 days and then just as needed - Drink plenty of water  - Warm salt water gargles - Use magic mouthwash- swish in mouth and hold for at least 5-10 seconds and spit.

## 2018-06-14 NOTE — Progress Notes (Signed)
Name: [name]   MRN: [id number]    DOB: 09/01/1940   Date:06/14/2018       Progress Note  Subjective  Chief Complaint  Chief Complaint  Patient presents with  . Follow-up    2 day f/u. patient seems to think her sx are improving. cough persists.    HPI  Patient feels symptoms have improved- a little more energy, less shortness of breaht states cough seems to have mildly improved. Only required Cardizem once two days ago.  No fevers or chills.  Using throat spray and tessalon perls.   Patient Active Problem List   Diagnosis Date Noted  . Aneurysm ([place]) 02/08/2018  . History of CVA (cerebrovascular accident) without residual deficits 01/24/2018  . 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 TIA (transient ischemic attack) 01/22/2018  . Impingement syndrome of shoulder region 01/02/2018  . History of cataract surgery, right 09/01/2016  . Paroxysmal atrial fibrillation ([place]) 02/24/2016  . Vertigo 02/24/2016  . Low HDL (under 40) 02/17/2016  . Dizziness 02/15/2016  . Hx of colonic polyps   . Benign neoplasm of sigmoid colon   . Asthma, mild intermittent 02/21/2015  . Hyperglycemia 02/21/2015  . Essential hypertension 02/20/2015  . Inconclusive mammogram due to dense breasts 01/02/2015  . Dense breast 01/02/2015  . Abnormal EKG 02/23/2013    Past Medical History:  Diagnosis Date  . History of stress test    a. 02/2013 Nl stress test.  . Hypertension   . Insomnia, unspecified   . Mitral regurgitation    a. echo 01/2016: nl LV sys fxn, mild MR, w/o pulm htn, nl atrial size  . Osteoporosis, unspecified   . Paroxysmal atrial fibrillation ([place]) 01/2016   a. diagnosed 01/2016; b. has been on eliquis, pradaxa, and xarelto at varying times 2/2 cost of each medication-->currently on eliquis (11/2016); c. CHADS2VASc --> 3 (HTN, age x 1, female).  . Pure hypercholesterolemia   . TIA (transient ischemic attack)   . Unspecified asthma(493.90)   . Unspecified transient  cerebral ischemia   . Vertigo     Past Surgical History:  Procedure Laterality Date  . ABDOMINAL HYSTERECTOMY    . CATARACT EXTRACTION Right 08/31/2016  . COLONOSCOPY WITH PROPOFOL N/A 03/04/2015   Procedure: COLONOSCOPY WITH PROPOFOL;  Surgeon: Lucilla Lame, MD;  Location: [facility] ENDOSCOPY;  Service: Endoscopy;  Laterality: N/A;    Social History   Tobacco Use  . Smoking status: Never Smoker  . Smokeless tobacco: Never Used  . Tobacco comment: smoking cessation materials not required  Substance Use Topics  . Alcohol use: No    Alcohol/week: 0.0 standard drinks     Current Outpatient Medications:  .  amLODipine (NORVASC) 5 MG tablet, TAKE 1 TABLET BY MOUTH EVERY DAY, Disp: 90 tablet, Rfl: 0 .  benzonatate (TESSALON PERLES) 100 MG capsule, Take 1 capsule (100 mg total) by mouth 3 (three) times daily as needed for cough., Disp: 30 capsule, Rfl: 0 .  diltiazem (CARDIZEM) 30 MG tablet, Take 1 tablet (30 mg total) by mouth every 6 (six) hours as needed (for tachycardia/recurrent Afib.)., Disp: 30 tablet, Rfl: 3 .  ELIQUIS 5 MG TABS tablet, TAKE 1 TABLET(5 MG) BY MOUTH TWICE DAILY, Disp: 60 tablet, Rfl: 6 .  meclizine (ANTIVERT) 25 MG tablet, TAKE 1 TABLET(25 MG) BY MOUTH EVERY 8 HOURS AS NEEDED (Patient not taking: Reported on 06/12/2018), Disp: 30 tablet, Rfl: 0 .  rosuvastatin (CRESTOR) 10 MG tablet, Take 1 tablet (10 mg total) by mouth  daily. In place of atorvastatin, Disp: 90 tablet, Rfl: 0  Allergies  Allergen Reactions  . Ace Inhibitors Swelling    Patient states her tongue swell.  [name] Penicillins Anxiety    Has patient had a PCN reaction causing immediate rash, facial/tongue/throat swelling, SOB or lightheadedness with hypotension: No Has patient had a PCN reaction causing severe rash involving mucus membranes or skin necrosis: No Has patient had a PCN reaction that required hospitalization No Has patient had a PCN reaction occurring within the last 10 years: No If all of the  above answers are "NO", then may proceed with Cephalosporin use.    ROS   No other specific complaints in a complete review of systems (except as listed in HPI above).  Objective  Vitals:   06/14/18 0815  BP: 136/72  Pulse: 78  Resp: 16  Temp: 97.7 F (36.5 C)  TempSrc: Oral  SpO2: 98%  Weight: 177 lb 9.6 oz (80.6 kg)  Height: 5\' 3"  (1.6 m)    Body mass index is 31.46 kg/m.  Nursing Note and Vital Signs reviewed.  Physical Exam Constitutional:      Appearance: Normal appearance.  HENT:     Head: Normocephalic and atraumatic.     Nose: Nose normal.     Mouth/Throat:     Lips: Pink.     Mouth: Mucous membranes are moist.     Pharynx: Pharyngeal swelling (difficult to visualize but noted some swelling and erythema more pronounced on right) and posterior oropharyngeal erythema present. No oropharyngeal exudate or uvula swelling.  Eyes:     Conjunctiva/sclera: Conjunctivae normal.     Pupils: Pupils are equal, round, and reactive to light.  Neck:     Musculoskeletal: Normal range of motion and neck supple.  Cardiovascular:     Rate and Rhythm: Normal rate and regular rhythm.  Pulmonary:     Effort: Pulmonary effort is normal.     Breath sounds: Normal breath sounds.  Lymphadenopathy:     Cervical: No cervical adenopathy.  Neurological:     General: No focal deficit present.     Mental Status: She is alert and oriented to person, place, and time.  Psychiatric:        Mood and Affect: Mood normal.        Thought Content: Thought content normal.     Results for orders placed or performed in visit on 06/12/18 (from the past 48 hour(s))  COMPLETE METABOLIC PANEL WITH GFR     Status: Abnormal   Collection Time: 06/12/18 10:35 AM  Result Value Ref Range   Glucose, Bld 101 65 - 139 mg/dL    Comment: .        Non-fasting reference interval .    BUN 17 7 - 25 mg/dL   Creat 0.94 (H) 0.60 - 0.93 mg/dL    Comment: For patients >67 years of age, the reference  limit for Creatinine is approximately 13% higher for people identified as African-American. .    GFR, Est Non African American 59 (L) > OR = 60 mL/min/1.84m2   GFR, Est African American 69 > OR = 60 mL/min/1.23m2   BUN/Creatinine Ratio 18 6 - 22 (calc)   Sodium 141 135 - 146 mmol/L   Potassium 4.0 3.5 - 5.3 mmol/L   Chloride 106 98 - 110 mmol/L   CO2 28 20 - 32 mmol/L   Calcium 10.1 8.6 - 10.4 mg/dL   Total Protein 7.5 6.1 - 8.1 g/dL   Albumin  4.2 3.6 - 5.1 g/dL   Globulin 3.3 1.9 - 3.7 g/dL (calc)   AG Ratio 1.3 1.0 - 2.5 (calc)   Total Bilirubin 0.5 0.2 - 1.2 mg/dL   Alkaline phosphatase (APISO) 76 33 - 130 U/L   AST 17 10 - 35 U/L   ALT 19 6 - 29 U/L  CBC     Status: None   Collection Time: 06/12/18 10:35 AM  Result Value Ref Range   WBC 9.7 3.8 - 10.8 Thousand/uL   RBC 4.79 3.80 - 5.10 Million/uL   Hemoglobin 14.4 11.7 - 15.5 g/dL   HCT 43.1 35.0 - 45.0 %   MCV 90.0 80.0 - 100.0 fL   MCH 30.1 27.0 - 33.0 pg   MCHC 33.4 32.0 - 36.0 g/dL   RDW 12.0 11.0 - 15.0 %   Platelets 385 140 - 400 Thousand/uL   MPV 11.1 7.5 - 12.5 fL  POCT Urinalysis Dipstick     Status: Abnormal   Collection Time: 06/12/18 10:45 AM  Result Value Ref Range   Color, UA amber    Clarity, UA clear    Glucose, UA Negative Negative   Bilirubin, UA Negative    Ketones, UA Negative    Spec Grav, UA 1.020 1.010 - 1.025   Blood, UA Negative    pH, UA 6.0 5.0 - 8.0   Protein, UA Negative Negative   Urobilinogen, UA 0.2 0.2 or 1.0 E.U./dL   Nitrite, UA Negative    Leukocytes, UA Trace (A) Negative   Appearance normal    Odor none   Urine Culture     Status: None   Collection Time: 06/12/18 10:55 AM  Result Value Ref Range   MICRO NUMBER: [id number]    SPECIMEN QUALITY: Adequate    Sample Source URINE    STATUS: FINAL    ISOLATE 1:      Single organism less than 10,000 CFU/mL isolated. These organisms, commonly found on external and internal genitalia, are considered colonizers. No further  testing performed.    Assessment & Plan  1. Sore throat Does not appear to be abscess, consider GERD, cough, URI, pharyngitis.  - magic mouthwash w/lidocaine SOLN; Take 5 mLs by mouth 4 (four) times daily as needed for mouth pain.  Dispense: 118 mL; Refill: 0  2. Cough Continue OTC treatments   3. Upper respiratory tract infection, unspecified type Discussed OTC treatment seems to be much improved will call if worsening or nor resolving.

## 2018-06-23 ENCOUNTER — Telehealth: Payer: Self-pay

## 2018-06-23 NOTE — Telephone Encounter (Signed)
Pt calling stating that the provider from [facility] clinic ordered the CPAP machine for herand she just wanted DR sowles to know

## 2018-06-23 NOTE — Telephone Encounter (Signed)
Copied from [place] #[id number]. Topic: General - Inquiry >> Jun 22, 2018  8:52 AM Reyne Dumas L wrote: Reason for CRM:   Pt states that she had a sleep study done, but never heard results.  Pt states that she got a call the other day from a medical supply store telling her she needed a CPAP machine.  Pt wants to know results from the doctor before getting anything.  Pt can be reached at [phone] or [phone]

## 2018-07-10 DIAGNOSIS — G4733 Obstructive sleep apnea (adult) (pediatric): Secondary | ICD-10-CM | POA: Diagnosis not present

## 2018-07-13 ENCOUNTER — Other Ambulatory Visit: Payer: Self-pay | Admitting: Family Medicine

## 2018-07-13 DIAGNOSIS — Z8673 Personal history of transient ischemic attack (TIA), and cerebral infarction without residual deficits: Secondary | ICD-10-CM

## 2018-07-13 DIAGNOSIS — E782 Mixed hyperlipidemia: Secondary | ICD-10-CM

## 2018-07-22 ENCOUNTER — Other Ambulatory Visit: Payer: Self-pay | Admitting: Family Medicine

## 2018-07-22 DIAGNOSIS — I10 Essential (primary) hypertension: Secondary | ICD-10-CM

## 2018-07-31 ENCOUNTER — Ambulatory Visit (INDEPENDENT_AMBULATORY_CARE_PROVIDER_SITE_OTHER): Payer: Medicare Other | Admitting: Family Medicine

## 2018-07-31 ENCOUNTER — Encounter: Payer: Self-pay | Admitting: Family Medicine

## 2018-07-31 VITALS — BP 134/84 | HR 82 | Temp 98.3°F | Resp 16 | Ht 63.0 in | Wt 176.0 lb

## 2018-07-31 DIAGNOSIS — R252 Cramp and spasm: Secondary | ICD-10-CM

## 2018-07-31 DIAGNOSIS — I10 Essential (primary) hypertension: Secondary | ICD-10-CM

## 2018-07-31 DIAGNOSIS — I480 Paroxysmal atrial fibrillation: Secondary | ICD-10-CM | POA: Diagnosis not present

## 2018-07-31 DIAGNOSIS — J069 Acute upper respiratory infection, unspecified: Secondary | ICD-10-CM | POA: Diagnosis not present

## 2018-07-31 DIAGNOSIS — J4521 Mild intermittent asthma with (acute) exacerbation: Secondary | ICD-10-CM | POA: Diagnosis not present

## 2018-07-31 DIAGNOSIS — I729 Aneurysm of unspecified site: Secondary | ICD-10-CM | POA: Diagnosis not present

## 2018-07-31 DIAGNOSIS — E782 Mixed hyperlipidemia: Secondary | ICD-10-CM

## 2018-07-31 DIAGNOSIS — Z8673 Personal history of transient ischemic attack (TIA), and cerebral infarction without residual deficits: Secondary | ICD-10-CM

## 2018-07-31 DIAGNOSIS — R05 Cough: Secondary | ICD-10-CM | POA: Diagnosis not present

## 2018-07-31 DIAGNOSIS — R7303 Prediabetes: Secondary | ICD-10-CM | POA: Diagnosis not present

## 2018-07-31 DIAGNOSIS — G4733 Obstructive sleep apnea (adult) (pediatric): Secondary | ICD-10-CM | POA: Diagnosis not present

## 2018-07-31 DIAGNOSIS — R059 Cough, unspecified: Secondary | ICD-10-CM

## 2018-07-31 MED ORDER — SPACER/AERO-HOLD CHAMBER BAGS MISC: 1.0000 | Freq: Two times a day (BID) | 0 refills | Status: DC

## 2018-07-31 MED ORDER — BENZONATATE 100 MG PO CAPS: 100.0000 mg | ORAL_CAPSULE | Freq: Three times a day (TID) | ORAL | 0 refills | Status: DC | PRN

## 2018-07-31 MED ORDER — BUDESONIDE-FORMOTEROL FUMARATE 160-4.5 MCG/ACT IN AERO: 2.0000 | INHALATION_SPRAY | Freq: Two times a day (BID) | RESPIRATORY_TRACT | 1 refills | Status: DC

## 2018-07-31 NOTE — Addendum Note (Signed)
Addended by: Loistine Chance on: 07/31/2018 04:42 PM   Modules accepted: Orders

## 2018-07-31 NOTE — Progress Notes (Signed)
Name: [name]   MRN: [id number]    DOB: [birth year]   Date:07/31/2018       Progress Note  Subjective  Chief Complaint  Chief Complaint  Patient presents with  . Medication Refill    3 month F/U  . Hypertension    Denies any symptoms  . Asthma  . Dyslipidemia    Muscle cramps in calves and behind her knee cap  . Nasal Congestion    Onset-Friday, headaches, fever, chills, body aches, fatigue has been taking alka seltzer plus cold and flu    HPI  HTN:she is now on norvasc, she denies chest pain or palpitation. She also denies dizziness.   Pre-diabetes: hgb A1C was 6.1% Denies polyphagia, polydipsia or polyuria . She has been trying to avoid carbs. We will recheck next visit   Vertigo: she has vertigo with head movement, motion sickness, and when she has an episode she needs to go to [facility] by EMS because of severe nausea, vomiting and diarrhea01/2018. She has seen ENT but no recent visits. She went back to [facility] 09/2016. No episodes since, doing well.   Paroxysmal Afib: sees Dr. Fletcher Anon, symptoms started August 2017, she is onEliquisand prn Cardizem.No recent episodes of fluttering or chest pain. No side effects of medication.   History of CVA/TIA and also aneursym of left ophthalmic artery , under the care of Dr. Manuella Ghazi. She stopped crestor on her own because she has been worried about side effect., but she is back on it since her last visit in our office. Normal liver enzymes 05/2018. She has noticed some cramping, intense, usually when resting, a couple of times a week on either calf. Advised to stay hydrated, stretch calves twice daily and increase fruit and vegetables intake. May also try Co-Q 10   Asthma: she states she was given Symbicort in the past, she has intermittent wheezing, that is present when she is on right lateral decubitus ( she has a history of GERD, and likely the trigger ). She developed rhinorrhea, nasal congestion, a dry cough and chills over the past 4  days. She states symptoms are unchanged, she has been taking otc cold medication without much help.   OSA: seen by Dr. Manuella Ghazi, unable to tolerate CPAP   CKI: she has not been taking nsaid's, last level improved, we will continue to monitor.  Patient Active Problem List   Diagnosis Date Noted  . Aneurysm ([place]) 02/08/2018  . History of CVA (cerebrovascular accident) without residual deficits 01/24/2018  . 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 TIA (transient ischemic attack) 01/22/2018  . Impingement syndrome of shoulder region 01/02/2018  . History of cataract surgery, right 09/01/2016  . Paroxysmal atrial fibrillation ([place]) 02/24/2016  . Vertigo 02/24/2016  . Low HDL (under 40) 02/17/2016  . Dizziness 02/15/2016  . Hx of colonic polyps   . Benign neoplasm of sigmoid colon   . Asthma, mild intermittent 02/21/2015  . Hyperglycemia 02/21/2015  . Essential hypertension 02/20/2015  . Inconclusive mammogram due to dense breasts 01/02/2015  . Dense breast 01/02/2015  . Abnormal EKG 02/23/2013    Past Surgical History:  Procedure Laterality Date  . ABDOMINAL HYSTERECTOMY    . CATARACT EXTRACTION Right 08/31/2016  . COLONOSCOPY WITH PROPOFOL N/A 03/04/2015   Procedure: COLONOSCOPY WITH PROPOFOL;  Surgeon: Lucilla Lame, MD;  Location: [facility] ENDOSCOPY;  Service: Endoscopy;  Laterality: N/A;    Family History  Problem Relation Age of Onset  . COPD Father   . Hypertension  Mother   . Aneurysm Mother   . Hypertension Brother   . Cancer Brother        pancreatic  . Healthy Brother     Social History   Socioeconomic History  . Marital status: Single    Spouse name: Not on file  . Number of children: 2  . Years of education: Not on file  . Highest education level: Associate degree: academic program  Occupational History  . Occupation: Retired  Scientific laboratory technician  . Financial resource strain: Not hard at all  . Food insecurity:    Worry: Never true    Inability: Never true  .  Transportation needs:    Medical: No    Non-medical: No  Tobacco Use  . Smoking status: Never Smoker  . Smokeless tobacco: Never Used  . Tobacco comment: smoking cessation materials not required  Substance and Sexual Activity  . Alcohol use: No    Alcohol/week: 0.0 standard drinks  . Drug use: No  . Sexual activity: Not Currently    Partners: Male  Lifestyle  . Physical activity:    Days per week: 3 days    Minutes per session: 30 min  . Stress: Not at all  Relationships  . Social connections:    Talks on phone: More than three times a week    Gets together: More than three times a week    Attends religious service: More than 4 times per year    Active member of club or organization: Yes    Attends meetings of clubs or organizations: More than 4 times per year    Relationship status: Never married  . Intimate partner violence:    Fear of current or ex partner: No    Emotionally abused: No    Physically abused: No    Forced sexual activity: No  Other Topics Concern  . Not on file  Social History Narrative   Independent at baseline. Lives at home with family     Current Outpatient Medications:  .  amLODipine (NORVASC) 5 MG tablet, TAKE 1 TABLET BY MOUTH EVERY DAY, Disp: 90 tablet, Rfl: 0 .  diltiazem (CARDIZEM) 30 MG tablet, Take 1 tablet (30 mg total) by mouth every 6 (six) hours as needed (for tachycardia/recurrent Afib.)., Disp: 30 tablet, Rfl: 3 .  ELIQUIS 5 MG TABS tablet, TAKE 1 TABLET(5 MG) BY MOUTH TWICE DAILY, Disp: 60 tablet, Rfl: 6 .  rosuvastatin (CRESTOR) 10 MG tablet, TAKE 1 TABLET BY MOUTH EVERY DAY IN PLACE OF ATORVASTATIN, Disp: 90 tablet, Rfl: 0 .  benzonatate (TESSALON PERLES) 100 MG capsule, Take 1 capsule (100 mg total) by mouth 3 (three) times daily as needed for cough. (Patient not taking: Reported on 07/31/2018), Disp: 30 capsule, Rfl: 0 .  magic mouthwash w/lidocaine SOLN, Take 5 mLs by mouth 4 (four) times daily as needed for mouth pain. (Patient not  taking: Reported on 07/31/2018), Disp: 118 mL, Rfl: 0  Allergies  Allergen Reactions  . Ace Inhibitors Swelling    Patient states her tongue swell.  [name] Penicillins Anxiety    Has patient had a PCN reaction causing immediate rash, facial/tongue/throat swelling, SOB or lightheadedness with hypotension: No Has patient had a PCN reaction causing severe rash involving mucus membranes or skin necrosis: No Has patient had a PCN reaction that required hospitalization No Has patient had a PCN reaction occurring within the last 10 years: No If all of the above answers are "NO", then may proceed with Cephalosporin use.  I personally reviewed active problem list, medication list, allergies, family history, social history with the patient/caregiver today.   ROS  Constitutional: Negative for fever or weight change.  Respiratory: positive  for cough but no  shortness of breath.   Cardiovascular: Negative for chest pain or palpitations.  Gastrointestinal: Negative for abdominal pain, no bowel changes.  Musculoskeletal: Negative for gait problem or joint swelling.  Skin: Negative for rash.  Neurological: Negative for dizziness , positive for headache since she has been sick .  No other specific complaints in a complete review of systems (except as listed in HPI above).  Objective  Vitals:   07/31/18 1559  BP: 134/84  Pulse: 82  Resp: 16  Temp: 98.3 F (36.8 C)  TempSrc: Oral  SpO2: 98%  Weight: 176 lb (79.8 kg)  Height: 5\' 3"  (1.6 m)    Body mass index is 31.18 kg/m.  Physical Exam  Constitutional: Patient appears well-developed and well-nourished. Obese No distress.  HEENT: head atraumatic, normocephalic, pupils equal and reactive to light, ears normal TM, boggy turbinates and clear rhinorrhea, neck supple, throat within normal limits Cardiovascular: Normal rate, regular rhythm and normal heart sounds.  No murmur heard. No BLE edema. Pulmonary/Chest: Effort normal and breath sounds  normal. No respiratory distress. Abdominal: Soft.  There is no tenderness. Psychiatric: Patient has a normal mood and affect. behavior is normal. Judgment and thought content normal.  Recent Results (from the past 2160 hour(s))  COMPLETE METABOLIC PANEL WITH GFR     Status: Abnormal   Collection Time: 06/12/18 10:35 AM  Result Value Ref Range   Glucose, Bld 101 65 - 139 mg/dL    Comment: .        Non-fasting reference interval .    BUN 17 7 - 25 mg/dL   Creat 0.94 (H) 0.60 - 0.93 mg/dL    Comment: For patients >35 years of age, the reference limit for Creatinine is approximately 13% higher for people identified as African-American. .    GFR, Est Non African American 59 (L) > OR = 60 mL/min/1.34m2   GFR, Est African American 69 > OR = 60 mL/min/1.28m2   BUN/Creatinine Ratio 18 6 - 22 (calc)   Sodium 141 135 - 146 mmol/L   Potassium 4.0 3.5 - 5.3 mmol/L   Chloride 106 98 - 110 mmol/L   CO2 28 20 - 32 mmol/L   Calcium 10.1 8.6 - 10.4 mg/dL   Total Protein 7.5 6.1 - 8.1 g/dL   Albumin 4.2 3.6 - 5.1 g/dL   Globulin 3.3 1.9 - 3.7 g/dL (calc)   AG Ratio 1.3 1.0 - 2.5 (calc)   Total Bilirubin 0.5 0.2 - 1.2 mg/dL   Alkaline phosphatase (APISO) 76 33 - 130 U/L   AST 17 10 - 35 U/L   ALT 19 6 - 29 U/L  CBC     Status: None   Collection Time: 06/12/18 10:35 AM  Result Value Ref Range   WBC 9.7 3.8 - 10.8 Thousand/uL   RBC 4.79 3.80 - 5.10 Million/uL   Hemoglobin 14.4 11.7 - 15.5 g/dL   HCT 43.1 35.0 - 45.0 %   MCV 90.0 80.0 - 100.0 fL   MCH 30.1 27.0 - 33.0 pg   MCHC 33.4 32.0 - 36.0 g/dL   RDW 12.0 11.0 - 15.0 %   Platelets 385 140 - 400 Thousand/uL   MPV 11.1 7.5 - 12.5 fL  POCT Urinalysis Dipstick     Status: Abnormal  Collection Time: 06/12/18 10:45 AM  Result Value Ref Range   Color, UA amber    Clarity, UA clear    Glucose, UA Negative Negative   Bilirubin, UA Negative    Ketones, UA Negative    Spec Grav, UA 1.020 1.010 - 1.025   Blood, UA Negative    pH, UA 6.0  5.0 - 8.0   Protein, UA Negative Negative   Urobilinogen, UA 0.2 0.2 or 1.0 E.U./dL   Nitrite, UA Negative    Leukocytes, UA Trace (A) Negative   Appearance normal    Odor none   Urine Culture     Status: None   Collection Time: 06/12/18 10:55 AM  Result Value Ref Range   MICRO NUMBER: [id number]    SPECIMEN QUALITY: Adequate    Sample Source URINE    STATUS: FINAL    ISOLATE 1:      Single organism less than 10,000 CFU/mL isolated. These organisms, commonly found on external and internal genitalia, are considered colonizers. No further testing performed.      PHQ2/9: Depression screen [facility] 2/9 06/14/2018 06/12/2018 04/28/2018 01/24/2018 01/03/2017  Decreased Interest 0 0 0 0 0  Down, Depressed, Hopeless 0 0 0 0 0  PHQ - 2 Score 0 0 0 0 0  Altered sleeping 0 0 - 0 -  Tired, decreased energy 0 0 - 0 -  Change in appetite 0 0 - 0 -  Feeling bad or failure about yourself  0 0 - 0 -  Trouble concentrating 0 0 - 0 -  Moving slowly or fidgety/restless 0 0 - 0 -  Suicidal thoughts 0 0 - 0 -  PHQ-9 Score 0 0 - 0 -  Difficult doing work/chores Not difficult at all Not difficult at all - Not difficult at all -     Fall Risk: Fall Risk  06/14/2018 06/12/2018 04/28/2018 01/24/2018 12/26/2017  Falls in the past year? 0 0 0 No No  Number falls in past yr: 0 0 - - -  Injury with Fall? 0 0 - - -  Risk for fall due to : - - - Impaired vision;Other (Comment) -  Risk for fall due to: Comment - - - wears eyeglasses; TIA -    Assessment & Plan  1. Essential hypertension  Under control   2. Cough  - benzonatate (TESSALON PERLES) 100 MG capsule; Take 1 capsule (100 mg total) by mouth 3 (three) times daily as needed for cough.  Dispense: 40 capsule; Refill: 0  3. Aneurysm [facility])  Due for repeat MRA, sent a message to Dr. Manuella Ghazi today   4. Paroxysmal atrial fibrillation (HCC)  Rate controlled, under the care of Dr. Fletcher Anon  5. Leg cramps  Discussed stretching, fluids and co-q 10 episodes  only a couple times a week   6. History of TIA (transient ischemic attack)   7. Pre-diabetes  Recheck labs next visit   8. Mixed hyperlipidemia  On Crestor   9. OSA (obstructive sleep apnea)  Unable to tolerate CPAP, Dr. Manuella Ghazi is trying to see if another option for her  10. Asthma in adult, mild intermittent, with acute exacerbation  - budesonide-formoterol (SYMBICORT) 160-4.5 MCG/ACT inhaler; Inhale 2 puffs into the lungs 2 (two) times daily.  Dispense: 1 Inhaler; Refill: 1  11. URI, acute  - benzonatate (TESSALON PERLES) 100 MG capsule; Take 1 capsule (100 mg total) by mouth 3 (three) times daily as needed for cough.  Dispense: 40 capsule; Refill: 0

## 2018-07-31 NOTE — Patient Instructions (Signed)
Co-Q 10 to help with leg cramps

## 2018-08-09 DIAGNOSIS — I119 Hypertensive heart disease without heart failure: Secondary | ICD-10-CM | POA: Diagnosis not present

## 2018-08-09 DIAGNOSIS — H029 Unspecified disorder of eyelid: Secondary | ICD-10-CM | POA: Diagnosis not present

## 2018-08-09 DIAGNOSIS — L821 Other seborrheic keratosis: Secondary | ICD-10-CM | POA: Diagnosis not present

## 2018-08-10 DIAGNOSIS — G4733 Obstructive sleep apnea (adult) (pediatric): Secondary | ICD-10-CM | POA: Diagnosis not present

## 2018-08-28 ENCOUNTER — Other Ambulatory Visit: Payer: Self-pay | Admitting: Neurology

## 2018-08-28 DIAGNOSIS — I672 Cerebral atherosclerosis: Secondary | ICD-10-CM | POA: Diagnosis not present

## 2018-08-28 DIAGNOSIS — M5481 Occipital neuralgia: Secondary | ICD-10-CM | POA: Diagnosis not present

## 2018-08-28 DIAGNOSIS — I729 Aneurysm of unspecified site: Secondary | ICD-10-CM | POA: Diagnosis not present

## 2018-08-28 DIAGNOSIS — G4733 Obstructive sleep apnea (adult) (pediatric): Secondary | ICD-10-CM | POA: Diagnosis not present

## 2018-08-28 DIAGNOSIS — Z8673 Personal history of transient ischemic attack (TIA), and cerebral infarction without residual deficits: Secondary | ICD-10-CM | POA: Diagnosis not present

## 2018-09-03 ENCOUNTER — Ambulatory Visit: Admission: RE | Admit: 2018-09-03 | Payer: Medicare Other | Source: Ambulatory Visit

## 2018-09-08 ENCOUNTER — Other Ambulatory Visit: Payer: Self-pay

## 2018-09-08 ENCOUNTER — Ambulatory Visit
Admission: RE | Admit: 2018-09-08 | Discharge: 2018-09-08 | Disposition: A | Discharge status: Home / Self Care | Payer: Medicare Other | Source: Ambulatory Visit | Attending: Neurology | Admitting: Neurology

## 2018-09-08 DIAGNOSIS — I729 Aneurysm of unspecified site: Secondary | ICD-10-CM

## 2018-09-08 DIAGNOSIS — I671 Cerebral aneurysm, nonruptured: Secondary | ICD-10-CM | POA: Diagnosis not present

## 2018-10-09 ENCOUNTER — Other Ambulatory Visit: Payer: Self-pay | Admitting: Family Medicine

## 2018-10-09 DIAGNOSIS — E782 Mixed hyperlipidemia: Secondary | ICD-10-CM

## 2018-10-09 DIAGNOSIS — Z8673 Personal history of transient ischemic attack (TIA), and cerebral infarction without residual deficits: Secondary | ICD-10-CM

## 2018-10-31 ENCOUNTER — Other Ambulatory Visit: Payer: Self-pay | Admitting: Family Medicine

## 2018-10-31 DIAGNOSIS — I10 Essential (primary) hypertension: Secondary | ICD-10-CM

## 2018-11-02 ENCOUNTER — Other Ambulatory Visit: Payer: Self-pay | Admitting: Family Medicine

## 2018-11-02 DIAGNOSIS — J4521 Mild intermittent asthma with (acute) exacerbation: Secondary | ICD-10-CM

## 2018-11-02 NOTE — Telephone Encounter (Signed)
Refill request for general medication. Symbicort to [facility].   Last office visit 02/30/2020   Follow up on 11/29/2018

## 2018-11-29 ENCOUNTER — Encounter: Payer: Self-pay | Admitting: Family Medicine

## 2018-11-29 ENCOUNTER — Other Ambulatory Visit: Payer: Self-pay

## 2018-11-29 ENCOUNTER — Ambulatory Visit (INDEPENDENT_AMBULATORY_CARE_PROVIDER_SITE_OTHER): Payer: Medicare Other | Admitting: Family Medicine

## 2018-11-29 VITALS — BP 134/86 | HR 86 | Temp 98.1°F | Resp 16 | Ht 63.0 in | Wt 178.4 lb

## 2018-11-29 DIAGNOSIS — I729 Aneurysm of unspecified site: Secondary | ICD-10-CM

## 2018-11-29 DIAGNOSIS — G4733 Obstructive sleep apnea (adult) (pediatric): Secondary | ICD-10-CM | POA: Diagnosis not present

## 2018-11-29 DIAGNOSIS — J4520 Mild intermittent asthma, uncomplicated: Secondary | ICD-10-CM | POA: Diagnosis not present

## 2018-11-29 DIAGNOSIS — I480 Paroxysmal atrial fibrillation: Secondary | ICD-10-CM

## 2018-11-29 DIAGNOSIS — E782 Mixed hyperlipidemia: Secondary | ICD-10-CM | POA: Diagnosis not present

## 2018-11-29 DIAGNOSIS — Z8673 Personal history of transient ischemic attack (TIA), and cerebral infarction without residual deficits: Secondary | ICD-10-CM

## 2018-11-29 DIAGNOSIS — R739 Hyperglycemia, unspecified: Secondary | ICD-10-CM | POA: Diagnosis not present

## 2018-11-29 DIAGNOSIS — I10 Essential (primary) hypertension: Secondary | ICD-10-CM

## 2018-11-29 MED ORDER — ROSUVASTATIN CALCIUM 10 MG PO TABS: 10.0000 mg | ORAL_TABLET | Freq: Every day | ORAL | 0 refills | Status: DC

## 2018-11-29 NOTE — Progress Notes (Signed)
Name: [name]   MRN: [id number]    DOB: 24-Feb-1941   Date:11/29/2018       Progress Note  Subjective  Chief Complaint  Chief Complaint  Patient presents with  . Medication Refill    4 month F/U  . Hypertension  . Asthma  . Dyslipidemia  . Atrial Fibrillation    HPI  HTN:she is now on norvasc,she denies chest pain or palpitation. She also denies dizziness.  Pre-diabetes: hgb A1C was 6.1% Denies polyphagia, polydipsia or polyuria. She has been trying to avoid carbs. We will recheck labs   Vertigo: she has vertigo with head movement, motion sickness, and when she has an episode she needs to go to [facility] by EMS because of severe nausea, vomiting and diarrhea01/2018. She has seen ENT but no recent visits. She went back to [facility] 09/2016. No episodes since, doing well.   Paroxysmal Afib: sees Dr. Fletcher Anon, symptoms started August 2017, she is onEliquisand prn Cardizem.No recent episodes of fluttering or chest pain. She states she has only taken one cardizem since first prescription   History of CVA/TIA and also aneursym of left ophthalmic artery , under the care of Dr. Manuella Ghazi - neurologist   She was on Atorvastatin and could not tolerate it but has been compliant with rosuvastatin now.   Asthma: she states she has been doing well, only uses Symbicort about 4 times a week when she thinks about, she tends to forget since feeling well, no cough, wheezing or SOB at this time  OSA: seen by Dr. Manuella Ghazi - neurologist , unable to tolerate CPAP, sleep study done 06/30/2018 , she was supposed to have an oral appliance but appointment with dentist was cancelled and she also states too expensive   CKI: she has not been taking nsaid's, last level improved, we will recheck on her next visit    Patient Active Problem List   Diagnosis Date Noted  . Aneurysm ([place]) 02/08/2018  . History of CVA (cerebrovascular accident) without residual deficits 01/24/2018  . Aneurysm of ophthalmic artery  01/24/2018  . TIA (transient ischemic attack) 01/22/2018  . Impingement syndrome of shoulder region 01/02/2018  . History of cataract surgery, right 09/01/2016  . Paroxysmal atrial fibrillation ([place]) 02/24/2016  . Vertigo 02/24/2016  . Low HDL (under 40) 02/17/2016  . Dizziness 02/15/2016  . Hx of colonic polyps   . Benign neoplasm of sigmoid colon   . Asthma, mild intermittent 02/21/2015  . Hyperglycemia 02/21/2015  . Essential hypertension 02/20/2015  . Inconclusive mammogram due to dense breasts 01/02/2015  . Dense breast 01/02/2015  . Abnormal EKG 02/23/2013    Past Surgical History:  Procedure Laterality Date  . ABDOMINAL HYSTERECTOMY    . CATARACT EXTRACTION Right 08/31/2016  . COLONOSCOPY WITH PROPOFOL N/A 03/04/2015   Procedure: COLONOSCOPY WITH PROPOFOL;  Surgeon: Lucilla Lame, MD;  Location: [facility] ENDOSCOPY;  Service: Endoscopy;  Laterality: N/A;    Family History  Problem Relation Age of Onset  . COPD Father   . Hypertension Mother   . Aneurysm Mother   . Hypertension Brother   . Cancer Brother        pancreatic  . Healthy Brother     Social History   Socioeconomic History  . Marital status: Single    Spouse name: Not on file  . Number of children: 2  . Years of education: Not on file  . Highest education level: Associate degree: academic program  Occupational History  . Occupation: Retired  Science writer  Needs  . Financial resource strain: Not hard at all  . Food insecurity:    Worry: Never true    Inability: Never true  . Transportation needs:    Medical: No    Non-medical: No  Tobacco Use  . Smoking status: Never Smoker  . Smokeless tobacco: Never Used  . Tobacco comment: smoking cessation materials not required  Substance and Sexual Activity  . Alcohol use: No    Alcohol/week: 0.0 standard drinks  . Drug use: No  . Sexual activity: Not Currently    Partners: Male  Lifestyle  . Physical activity:    Days per week: 3 days    Minutes per session:  30 min  . Stress: Not at all  Relationships  . Social connections:    Talks on phone: More than three times a week    Gets together: More than three times a week    Attends religious service: More than 4 times per year    Active member of club or organization: Yes    Attends meetings of clubs or organizations: More than 4 times per year    Relationship status: Never married  . Intimate partner violence:    Fear of current or ex partner: No    Emotionally abused: No    Physically abused: No    Forced sexual activity: No  Other Topics Concern  . Not on file  Social History Narrative   Independent at baseline. Lives at home with family     Current Outpatient Medications:  .  amLODipine (NORVASC) 5 MG tablet, TAKE 1 TABLET BY MOUTH EVERY DAY, Disp: 90 tablet, Rfl: 0 .  diltiazem (CARDIZEM) 30 MG tablet, Take 1 tablet (30 mg total) by mouth every 6 (six) hours as needed (for tachycardia/recurrent Afib.)., Disp: 30 tablet, Rfl: 3 .  ELIQUIS 5 MG TABS tablet, TAKE 1 TABLET(5 MG) BY MOUTH TWICE DAILY, Disp: 60 tablet, Rfl: 6 .  meclizine (ANTIVERT) 25 MG tablet, Take 25 mg by mouth 3 (three) times daily as needed for dizziness., Disp: , Rfl:  .  rosuvastatin (CRESTOR) 10 MG tablet, TAKE 1 TABLET BY MOUTH EVERY DAY IN PLACE OF ATORVASTATIN, Disp: 90 tablet, Rfl: 0 .  Spacer/Aero-Hold Chamber Bags MISC, 1 each by Does not apply route 2 (two) times daily., Disp: 1 each, Rfl: 0 .  SYMBICORT 160-4.5 MCG/ACT inhaler, INHALE 2 PUFFS INTO THE LUNGS TWICE DAILY, Disp: 10.2 g, Rfl: 0  Allergies  Allergen Reactions  . Ace Inhibitors Swelling    Patient states her tongue swell.  [name] Penicillins Anxiety    Has patient had a PCN reaction causing immediate rash, facial/tongue/throat swelling, SOB or lightheadedness with hypotension: No Has patient had a PCN reaction causing severe rash involving mucus membranes or skin necrosis: No Has patient had a PCN reaction that required hospitalization No Has  patient had a PCN reaction occurring within the last 10 years: No If all of the above answers are "NO", then may proceed with Cephalosporin use.    I personally reviewed active problem list, medication list, allergies, family history, social history with the patient/caregiver today.   ROS  Constitutional: Negative for fever or weight change.  Respiratory: Negative for cough and shortness of breath.   Cardiovascular: Negative for chest pain or palpitations.  Gastrointestinal: Negative for abdominal pain, no bowel changes.  Musculoskeletal: Negative for gait problem or joint swelling.  Skin: Negative for rash.  Neurological: Negative for dizziness or headache.  No other specific complaints  in a complete review of systems (except as listed in HPI above).   Objective  Vitals:   11/29/18 1552  BP: 134/86  Pulse: 86  Resp: 16  Temp: 98.1 F (36.7 C)  TempSrc: Oral  SpO2: 98%  Weight: 178 lb 6.4 oz (80.9 kg)  Height: 5\' 3"  (1.6 m)    Body mass index is 31.6 kg/m.  Physical Exam  Constitutional: Patient appears well-developed and well-nourished. Obese  No distress.  HEENT: head atraumatic, normocephalic, pupils equal and reactive to light, neck supple, throat within normal limits Cardiovascular: Normal rate, regular rhythm and normal heart sounds.  No murmur heard. No BLE edema. Pulmonary/Chest: Effort normal and breath sounds normal. No respiratory distress. Abdominal: Soft.  There is no tenderness. Psychiatric: Patient has a normal mood and affect. behavior is normal. Judgment and thought content normal.  PHQ2/9: Depression screen [facility] 2/9 11/29/2018 06/14/2018 06/12/2018 04/28/2018 01/24/2018  Decreased Interest 0 0 0 0 0  Down, Depressed, Hopeless 0 0 0 0 0  PHQ - 2 Score 0 0 0 0 0  Altered sleeping 0 0 0 - 0  Tired, decreased energy 0 0 0 - 0  Change in appetite 0 0 0 - 0  Feeling bad or failure about yourself  0 0 0 - 0  Trouble concentrating 0 0 0 - 0  Moving slowly  or fidgety/restless 0 0 0 - 0  Suicidal thoughts 0 0 0 - 0  PHQ-9 Score 0 0 0 - 0  Difficult doing work/chores Not difficult at all Not difficult at all Not difficult at all - Not difficult at all    phq 9 is negative   Fall Risk: Fall Risk  11/29/2018 06/14/2018 06/12/2018 04/28/2018 01/24/2018  Falls in the past year? 0 0 0 0 No  Number falls in past yr: 0 0 0 - -  Injury with Fall? 0 0 0 - -  Risk for fall due to : - - - - Impaired vision;Other (Comment)  Risk for fall due to: Comment - - - - wears eyeglasses; TIA      Functional Status Survey: Is the patient deaf or have difficulty hearing?: No Does the patient have difficulty seeing, even when wearing glasses/contacts?: Yes(glasses) Does the patient have difficulty concentrating, remembering, or making decisions?: No Does the patient have difficulty walking or climbing stairs?: No Does the patient have difficulty dressing or bathing?: No Does the patient have difficulty doing errands alone such as visiting a doctor's office or shopping?: No    Assessment & Plan  1. Essential hypertension  - COMPLETE METABOLIC PANEL WITH GFR - CBC with Differential/Platelet   2. Aneurysm ([place])   3. Paroxysmal atrial fibrillation (HCC)  - CBC with Differential/Platelet  4. OSA (obstructive sleep apnea)   5. History of TIA (transient ischemic attack)   6. Mild intermittent asthma without complication   7. Mixed hyperlipidemia  - Lipid panel - rosuvastatin (CRESTOR) 10 MG tablet; Take 1 tablet (10 mg total) by mouth daily.  Dispense: 90 tablet; Refill: 0  8. Hyperglycemia  - Hemoglobin A1c  9. History of CVA (cerebrovascular accident) without residual deficits  - rosuvastatin (CRESTOR) 10 MG tablet; Take 1 tablet (10 mg total) by mouth daily.  Dispense: 90 tablet; Refill: 0

## 2018-12-09 ENCOUNTER — Other Ambulatory Visit: Payer: Self-pay | Admitting: Cardiovascular Disease

## 2018-12-11 NOTE — Telephone Encounter (Signed)
Pt's wt 80.9 kg, age 78, SCr 0.94, CrCl 66.04.

## 2018-12-11 NOTE — Telephone Encounter (Signed)
Refill Request.  

## 2018-12-21 ENCOUNTER — Other Ambulatory Visit: Payer: Self-pay | Admitting: Family Medicine

## 2018-12-21 DIAGNOSIS — Z1231 Encounter for screening mammogram for malignant neoplasm of breast: Secondary | ICD-10-CM

## 2019-01-08 ENCOUNTER — Encounter: Payer: Self-pay | Admitting: Physician Assistant

## 2019-01-08 ENCOUNTER — Telehealth: Payer: Self-pay | Admitting: Cardiovascular Disease

## 2019-01-08 NOTE — Progress Notes (Signed)
Cardiology Office Note Date:  01/09/2019  Patient ID:  [name], [name] 1940/10/20, MRN [id number] PCP:  Steele Sizer, MD  Cardiologist:  Dr. Fletcher Anon, MD    Chief Complaint: Follow-up A. fib  History of Present Illness: [name] is a 78 y.o. female with history of PAF on Eliquis, mitral regurgitation, TIA, severe vertigo, small ophthalmic artery aneurysm, HTN, HLD, insomnia, and osteoporosis who presents for follow up of Afib.   Prior stress test in 02/2013 showed no evidence of ischemia.  She was admitted to the hospital in 01/2016 with vertigo and noted to be in new onset A. fib with RVR.  She converted to sinus rhythm with diltiazem.  Echo at that time showed an EF of 55 to 60%, mild concentric LVH, normal wall motion, mild mitral regurgitation, PASP normal.  She was admitted to the hospital and 12/2017 with spasms involving her left hand.  MRI of the brain showed no evidence of acute stroke.  There was an incidental finding of a small ophthalmic artery aneurysm.  She had no visual complaints.  Echo showed normal LV systolic function with an EF of 60 to 65%, normal wall motion, grade 1 diastolic dysfunction, normal RV systolic function, no atrial septal defect or PFO, bubble study showed no atrial level shunting at baseline or with provocation, normal PASP, with no cardiac source of emboli.  Carotid artery Doppler showed mild nonobstructive disease.  She was most recently seen in the office in 01/2018 and was doing well from a cardiac perspective.  She comes in doing reasonably well from a cardiac perspective.  She denies any episodes of palpitations, shortness of breath, dizziness, presyncope, or syncope.  No lower extremity swelling, abdominal distention, orthopnea, PND, early satiety.  No falls, BRBPR, or melena.  She is tolerating all medications without issues.  She does mention however on the evening of 7/10 she was laying in bed and had a sudden onset of sharp left-sided chest  pain that lasted for several seconds and spontaneously resolved.  She did take an as needed diltiazem though denied any palpitations associated with this.  She denies any exertional chest pain or shortness of breath.  She states her energy level has been at its baseline and well.  She has been asymptomatic since this episode on the evening of 7/10.  Labs: 05/2018 - Hgb 14.4, PLT 385, potassium 4.0, serum creatinine 0.94, albumin 4.2, AST/ALT normal 12/2017 - A1c 6.1, LDL 106  Past Medical History:  Diagnosis Date  . History of stress test    a. 02/2013 Nl stress test.  . Hypertension   . Insomnia, unspecified   . Mitral regurgitation    a. echo 01/2016: nl LV sys fxn, mild MR, w/o pulm htn, nl atrial size  . Osteoporosis, unspecified   . Paroxysmal atrial fibrillation ([place]) 01/2016   a. diagnosed 01/2016; b. has been on eliquis, pradaxa, and xarelto at varying times 2/2 cost of each medication-->currently on eliquis (11/2016); c. CHADS2VASc --> 3 (HTN, age x 1, female).  . Pure hypercholesterolemia   . TIA (transient ischemic attack)   . Unspecified asthma(493.90)   . Vertigo     Past Surgical History:  Procedure Laterality Date  . ABDOMINAL HYSTERECTOMY    . CATARACT EXTRACTION Right 08/31/2016  . COLONOSCOPY WITH PROPOFOL N/A 03/04/2015   Procedure: COLONOSCOPY WITH PROPOFOL;  Surgeon: Lucilla Lame, MD;  Location: [facility] ENDOSCOPY;  Service: Endoscopy;  Laterality: N/A;    Current Meds  Medication Sig  .  amLODipine (NORVASC) 5 MG tablet TAKE 1 TABLET BY MOUTH EVERY DAY  . diltiazem (CARDIZEM) 30 MG tablet Take 1 tablet (30 mg total) by mouth every 6 (six) hours as needed (for tachycardia/recurrent Afib.).  [name] ELIQUIS 5 MG TABS tablet TAKE 1 TABLET(5 MG) BY MOUTH TWICE DAILY  . meclizine (ANTIVERT) 25 MG tablet Take 25 mg by mouth 3 (three) times daily as needed for dizziness.  . rosuvastatin (CRESTOR) 10 MG tablet Take 1 tablet (10 mg total) by mouth daily.  [name] Spacer/Aero-Hold Chamber  Bags MISC 1 each by Does not apply route 2 (two) times daily.  . SYMBICORT 160-4.5 MCG/ACT inhaler INHALE 2 PUFFS INTO THE LUNGS TWICE DAILY    Allergies:   Ace inhibitors and Penicillins   Social History:  The patient  reports that she has never smoked. She has never used smokeless tobacco. She reports that she does not drink alcohol or use drugs.   Family History:  The patient's family history includes Aneurysm in her mother; COPD in her father; Cancer in her brother; Healthy in her brother; Hypertension in her brother and mother; Stroke in her brother.  ROS:   Review of Systems  Constitutional: Negative for chills, diaphoresis, fever, malaise/fatigue and weight loss.  HENT: Negative for congestion.   Eyes: Negative for discharge and redness.  Respiratory: Negative for cough, hemoptysis, sputum production, shortness of breath and wheezing.   Cardiovascular: Positive for chest pain. Negative for palpitations, orthopnea, claudication, leg swelling and PND.  Gastrointestinal: Negative for abdominal pain, blood in stool, heartburn, melena, nausea and vomiting.  Genitourinary: Negative for hematuria.  Musculoskeletal: Negative for falls and myalgias.  Skin: Negative for rash.  Neurological: Negative for dizziness, tingling, tremors, sensory change, speech change, focal weakness, loss of consciousness and weakness.  Endo/Heme/Allergies: Does not bruise/bleed easily.  Psychiatric/Behavioral: Negative for substance abuse. The patient is not nervous/anxious.   All other systems reviewed and are negative.    PHYSICAL EXAM:  VS:  BP 140/84 (BP Location: Left Arm, Patient Position: Sitting, Cuff Size: Normal)   Pulse (!) 55   Temp (!) 97.5 F (36.4 C)   Ht 5\' 3"  (1.6 m)   Wt 179 lb 8 oz (81.4 kg)   SpO2 96%   BMI 31.80 kg/m  BMI: Body mass index is 31.8 kg/m.  Physical Exam  Constitutional: She is oriented to person, place, and time. She appears well-developed and well-nourished.   HENT:  Head: Normocephalic and atraumatic.  Eyes: Right eye exhibits no discharge. Left eye exhibits no discharge.  Neck: Normal range of motion. No JVD present.  Cardiovascular: Regular rhythm, S1 normal, S2 normal and normal heart sounds. Bradycardia present. Exam reveals no distant heart sounds, no friction rub, no midsystolic click and no opening snap.  No murmur heard. Pulses:      Posterior tibial pulses are 2+ on the right side and 2+ on the left side.  Pulmonary/Chest: Effort normal and breath sounds normal. No respiratory distress. She has no decreased breath sounds. She has no wheezes. She has no rales. She exhibits no tenderness.  Abdominal: Soft. She exhibits no distension. There is no abdominal tenderness.  Musculoskeletal:        General: No edema.  Neurological: She is alert and oriented to person, place, and time.  Skin: Skin is warm and dry. No cyanosis. Nails show no clubbing.  Psychiatric: She has a normal mood and affect. Her speech is normal and behavior is normal. Judgment and thought content normal.  EKG:  Was ordered and interpreted by me today. Shows sinus bradycardia, 55 bpm, baseline wandering, poor R wave progression along the precordial leads, no acute ST-T changes  Recent Labs: 06/12/2018: ALT 19; BUN 17; Creat 0.94; Hemoglobin 14.4; Platelets 385; Potassium 4.0; Sodium 141  01/22/2018: Cholesterol 153; HDL 40; LDL Cholesterol 106; Total CHOL/HDL Ratio 3.8; Triglycerides 36; VLDL 7   CrCl cannot be calculated (Patient's most recent lab result is older than the maximum 21 days allowed.).   Wt Readings from Last 3 Encounters:  01/09/19 179 lb 8 oz (81.4 kg)  11/29/18 178 lb 6.4 oz (80.9 kg)  07/31/18 176 lb (79.8 kg)     Other studies reviewed: Additional studies/records reviewed today include: summarized above  ASSESSMENT AND PLAN:  1. Chest pain with moderate risk for cardiac etiology: Overall, symptoms are atypical for angina.  We will schedule  the patient for Lexiscan Myoview to evaluate for high risk ischemia.  If this is unrevealing no further ischemic work-up will be warranted at this time.  Continue Eliquis in place of aspirin.  Continue Crestor as outlined below.  2. PAF: Maintaining sinus rhythm with a mildly bradycardic heart rate.  Not requiring rate limiting medication given bradycardic heart rate.  Continue as needed diltiazem for tachypalpitations.  Continue Eliquis 5 mg twice daily as she does not meet reduced dosing criteria.  CHADS2VASc at least 6 (HTN, age x 2, TIA x 2, female).  3. Hypertension: Blood pressure mildly elevated at 140/84 today.  Increase amlodipine to 7.5 mg daily.  4. Hyperlipidemia: LDL of 106 from 12/2017.  Goal LDL less than 70.  Check lipid panel, direct LDL, and CMP.  For now, continue Crestor 10 mg daily with further titration pending labs drawn today.  5. TIA: No further symptoms.  On Eliquis as outlined above.  Continue Crestor with further titration as outlined above.  Disposition: F/u with Dr. Fletcher Anon or an APP in 2 weeks.  Current medicines are reviewed at length with the patient today.  The patient did not have any concerns regarding medicines.  Signed, Christell Faith, PA-C 01/09/2019 9:39 AM     [address] [place], [place] [address] [phone]

## 2019-01-08 NOTE — Telephone Encounter (Signed)
Pt c/o of Chest Pain: STAT if CP now or developed within 24 hours  1. Are you having CP right now? No, not since Friday night  2. Are you experiencing any other symptoms (ex. SOB, nausea, vomiting, sweating)? No other symptoms  3. How long have you been experiencing CP? Just Friday night  4. Is your CP continuous or coming and going? Just happened and then went away  5. Have you taken Nitroglycerin? No  patient is seeing Christell Faith,  [place] 01/09/19?

## 2019-01-08 NOTE — Telephone Encounter (Signed)
Lm to call back ./cy 

## 2019-01-09 ENCOUNTER — Other Ambulatory Visit: Payer: Self-pay

## 2019-01-09 ENCOUNTER — Encounter: Payer: Self-pay | Admitting: Physician Assistant

## 2019-01-09 ENCOUNTER — Ambulatory Visit: Payer: Medicare Other | Admitting: Physician Assistant

## 2019-01-09 VITALS — BP 140/84 | HR 55 | Temp 97.5°F | Ht 63.0 in | Wt 179.5 lb

## 2019-01-09 DIAGNOSIS — I480 Paroxysmal atrial fibrillation: Secondary | ICD-10-CM | POA: Diagnosis not present

## 2019-01-09 DIAGNOSIS — I10 Essential (primary) hypertension: Secondary | ICD-10-CM | POA: Diagnosis not present

## 2019-01-09 DIAGNOSIS — R079 Chest pain, unspecified: Secondary | ICD-10-CM

## 2019-01-09 DIAGNOSIS — G459 Transient cerebral ischemic attack, unspecified: Secondary | ICD-10-CM

## 2019-01-09 DIAGNOSIS — E785 Hyperlipidemia, unspecified: Secondary | ICD-10-CM

## 2019-01-09 MED ORDER — AMLODIPINE BESYLATE 5 MG PO TABS: 7.5000 mg | ORAL_TABLET | Freq: Every day | ORAL | 0 refills | Status: DC

## 2019-01-09 NOTE — Telephone Encounter (Signed)
Patient seen earlier this morning by provider and concerns addressed.

## 2019-01-09 NOTE — Patient Instructions (Addendum)
Medication Instructions:  INCREASE: Amlodipine to 7.5 mg once a day   If you need a refill on your cardiac medications before your next appointment, please call your pharmacy.   Lab work: TODAY : LIPIDS, CMET, DIRECT LDL & CBC   If you have labs (blood work) drawn today and your tests are completely normal, you will receive your results only by: [name] MyChart Message (if you have MyChart) OR . A paper copy in the mail If you have any lab test that is abnormal or we need to change your treatment, we will call you to review the results.  Testing/Procedures: [place]  Your caregiver has ordered a Stress Test with nuclear imaging. The purpose of this test is to evaluate the blood supply to your heart muscle. This procedure is referred to as a "Non-Invasive Stress Test." This is because other than having an IV started in your vein, nothing is inserted or "invades" your body. Cardiac stress tests are done to find areas of poor blood flow to the heart by determining the extent of coronary artery disease (CAD). Some patients exercise on a treadmill, which naturally increases the blood flow to your heart, while others who are  unable to walk on a treadmill due to physical limitations have a pharmacologic/chemical stress agent called Lexiscan . This medicine will mimic walking on a treadmill by temporarily increasing your coronary blood flow.   Please note: these test may take anywhere between 2-4 hours to complete  PLEASE REPORT TO [place] AT THE FIRST DESK WILL DIRECT YOU WHERE TO GO  Date of Procedure:_____________________________________  Arrival Time for Procedure:______________________________  Instructions regarding medication:   ____ : Hold diabetes medication morning of procedure  ____:  Hold betablocker(s) night before procedure and morning of procedure  ____:  Hold other medications as  follows:_________________________________________________________________________________________________________________________________________________________________________________________________________________________________________________________________________________________  PLEASE NOTIFY THE OFFICE AT LEAST 24 HOURS IN ADVANCE IF YOU ARE UNABLE TO KEEP YOUR APPOINTMENT.  [phone] AND  PLEASE NOTIFY NUCLEAR MEDICINE AT [facility] AT LEAST 24 HOURS IN ADVANCE IF YOU ARE UNABLE TO KEEP YOUR APPOINTMENT. [phone]  How to prepare for your Myoview test:  1. Do not eat or drink after midnight 2. No caffeine for 24 hours prior to test 3. No smoking 24 hours prior to test. 4. Your medication may be taken with water.  If your doctor stopped a medication because of this test, do not take that medication. 5. Ladies, please do not wear dresses.  Skirts or pants are appropriate. Please wear a short sleeve shirt. 6. No perfume, cologne or lotion. 7. Wear comfortable walking shoes. No heels!   Follow-Up: At [facility], you and your health needs are our priority.  As part of our continuing mission to provide you with exceptional heart care, we have created designated Provider Care Teams.  These Care Teams include your primary Cardiologist (physician) and Advanced Practice Providers (APPs -  Physician Assistants and Nurse Practitioners) who all work together to provide you with the care you need, when you need it. You will need a follow up appointment in 2 weeks.  Advanced Practice Providers on your designated Care Team:   Murray Hodgkins, NP Christell Faith, PA-C . Marrianne Mood, PA-C  Any Other Special Instructions Will Be Listed Below (If Applicable).

## 2019-01-10 LAB — COMPREHENSIVE METABOLIC PANEL WITH GFR
ALT: 24 [IU]/L (ref 0–32)
AST: 21 [IU]/L (ref 0–40)
Albumin/Globulin Ratio: 1.4 (ref 1.2–2.2)
Albumin: 4.3 g/dL (ref 3.7–4.7)
Alkaline Phosphatase: 69 [IU]/L (ref 39–117)
BUN/Creatinine Ratio: 14 (ref 12–28)
BUN: 13 mg/dL (ref 8–27)
Bilirubin Total: 0.3 mg/dL (ref 0.0–1.2)
CO2: 22 mmol/L (ref 20–29)
Calcium: 9.6 mg/dL (ref 8.7–10.3)
Chloride: 104 mmol/L (ref 96–106)
Creatinine, Ser: 0.93 mg/dL (ref 0.57–1.00)
GFR calc Af Amer: 70 mL/min/{1.73_m2}
GFR calc non Af Amer: 60 mL/min/{1.73_m2}
Globulin, Total: 3.1 g/dL (ref 1.5–4.5)
Glucose: 98 mg/dL (ref 65–99)
Potassium: 4.9 mmol/L (ref 3.5–5.2)
Sodium: 141 mmol/L (ref 134–144)
Total Protein: 7.4 g/dL (ref 6.0–8.5)

## 2019-01-10 LAB — LIPID PANEL
Chol/HDL Ratio: 2.5 ratio (ref 0.0–4.4)
Cholesterol, Total: 98 mg/dL — ABNORMAL LOW (ref 100–199)
HDL: 39 mg/dL — ABNORMAL LOW
LDL Calculated: 42 mg/dL (ref 0–99)
Triglycerides: 85 mg/dL (ref 0–149)
VLDL Cholesterol Cal: 17 mg/dL (ref 5–40)

## 2019-01-10 LAB — LDL CHOLESTEROL, DIRECT: LDL Direct: 46 mg/dL (ref 0–99)

## 2019-01-10 LAB — CBC
Hematocrit: 43.7 % (ref 34.0–46.6)
Hemoglobin: 14.5 g/dL (ref 11.1–15.9)
MCH: 29.7 pg (ref 26.6–33.0)
MCHC: 33.2 g/dL (ref 31.5–35.7)
MCV: 89 fL (ref 79–97)
Platelets: 403 10*3/uL (ref 150–450)
RBC: 4.89 x10E6/uL (ref 3.77–5.28)
RDW: 12.9 % (ref 11.7–15.4)
WBC: 6.1 10*3/uL (ref 3.4–10.8)

## 2019-01-11 ENCOUNTER — Encounter
Admission: RE | Admit: 2019-01-11 | Discharge: 2019-01-11 | Disposition: A | Discharge status: Home / Self Care | Payer: Medicare Other | Source: Ambulatory Visit | Attending: Physician Assistant | Admitting: Physician Assistant

## 2019-01-11 ENCOUNTER — Other Ambulatory Visit: Payer: Self-pay

## 2019-01-11 DIAGNOSIS — R079 Chest pain, unspecified: Secondary | ICD-10-CM | POA: Diagnosis present

## 2019-01-11 MED ORDER — TECHNETIUM TC 99M TETROFOSMIN IV KIT
28.7670 | PACK | Freq: Once | INTRAVENOUS | Status: AC | PRN
  Administered 2019-01-11: 28.767 via INTRAVENOUS

## 2019-01-11 MED ORDER — TECHNETIUM TC 99M TETROFOSMIN IV KIT
10.1630 | PACK | Freq: Once | INTRAVENOUS | Status: AC | PRN
  Administered 2019-01-11: 10.163 via INTRAVENOUS

## 2019-01-11 MED ORDER — REGADENOSON 0.4 MG/5ML IV SOLN
0.4000 mg | Freq: Once | INTRAVENOUS | Status: AC
  Administered 2019-01-11: 0.4 mg via INTRAVENOUS
  Filled 2019-01-11: qty 5

## 2019-01-12 LAB — NM MYOCAR MULTI W/SPECT W/WALL MOTION / EF
LV dias vol: 68 mL (ref 46–106)
LV sys vol: 29 mL
Peak HR: 98 {beats}/min
Percent HR: 67 %
Rest HR: 49 {beats}/min
SDS: 4
SRS: 3
SSS: 6
TID: 0.9

## 2019-01-17 ENCOUNTER — Telehealth: Payer: Self-pay | Admitting: Cardiovascular Disease

## 2019-01-17 ENCOUNTER — Telehealth: Payer: Self-pay | Admitting: *Deleted

## 2019-01-17 DIAGNOSIS — R079 Chest pain, unspecified: Secondary | ICD-10-CM

## 2019-01-17 DIAGNOSIS — I480 Paroxysmal atrial fibrillation: Secondary | ICD-10-CM

## 2019-01-17 NOTE — Telephone Encounter (Signed)

## 2019-01-17 NOTE — Telephone Encounter (Signed)
Results called to pt. Pt verbalized understanding. Transferred to scheduling for echo.

## 2019-01-17 NOTE — Telephone Encounter (Signed)
-----   Message from Rise Mu, PA-C sent at 01/13/2019  7:50 AM EDT ----- Stress test showed no evidence of ischemia, slightly reduced pump function, likely secondary to GI uptake artifact, no EKG changes concerning for ischemia. Overall, low risk stress test. Please schedule echo to confirm EF.

## 2019-01-18 ENCOUNTER — Other Ambulatory Visit: Payer: Self-pay

## 2019-01-18 ENCOUNTER — Ambulatory Visit (INDEPENDENT_AMBULATORY_CARE_PROVIDER_SITE_OTHER): Payer: Medicare Other

## 2019-01-18 DIAGNOSIS — R079 Chest pain, unspecified: Secondary | ICD-10-CM

## 2019-01-18 DIAGNOSIS — I480 Paroxysmal atrial fibrillation: Secondary | ICD-10-CM

## 2019-01-18 MED ORDER — PERFLUTREN LIPID MICROSPHERE
1.0000 mL | INTRAVENOUS | Status: AC | PRN
  Administered 2019-01-18: 2 mL via INTRAVENOUS

## 2019-01-19 ENCOUNTER — Encounter: Payer: Self-pay | Admitting: *Deleted

## 2019-01-21 NOTE — Progress Notes (Signed)
Cardiology Office Note Date:  01/23/2019  Patient ID:  [name], [name] 05/19/1941, MRN [id number] PCP:  Steele Sizer, MD  Cardiologist:  Dr. Fletcher Anon, MD    Chief Complaint: Follow up Myoview  History of Present Illness: [name] is a 78 y.o. female with history of PAF on Eliquis, mitral regurgitation, TIA, severe vertigo, small ophthalmic artery aneurysm, HTN, HLD, insomnia, and osteoporosis who presents for follow up of recent Myoview.   Prior stress test in 02/2013 showed no evidence of ischemia.  She was admitted to the hospital in 01/2016 with vertigo and noted to be in new onset A. fib with RVR.  She converted to sinus rhythm with diltiazem.  Echo at that time showed an EF of 55 to 60%, mild concentric LVH, normal wall motion, mild mitral regurgitation, PASP normal.  She was admitted to the hospital and 12/2017 with spasms involving her left hand.  MRI of the brain showed no evidence of acute stroke.  There was an incidental finding of a small ophthalmic artery aneurysm.  She had no visual complaints.  Echo showed normal LV systolic function with an EF of 60 to 65%, normal wall motion, grade 1 diastolic dysfunction, normal RV systolic function, no atrial septal defect or PFO, bubble study showed no atrial level shunting at baseline or with provocation, normal PASP, with no cardiac source of emboli.  Carotid artery Doppler showed mild nonobstructive disease.  She was seen in the office in 01/2018 and was doing well from a cardiac perspective.  She was most recently seen in the office on 01/09/2019 and was doing well from a cardiac perspective.  She did note an episode of chest pain while lying in bed several days prior to that visit which was sharp and left-sided lasting for several seconds with spontaneous resolution and had been without symptoms since.  She underwent Lexiscan Myoview on 01/11/2019 which showed no significant ischemia, normal wall motion, EF estimated at 45%, GI uptake  artifact, no EKG changes concerning for ischemia at peak stress or recovery.  Overall, this was a low risk scan.  Follow-up echo on 01/18/2019 to confirm EF showed an EF of 55 to 60%, normal LV cavity size, diastolic dysfunction, normal wall motion, normal RV systolic function, mild biatrial enlargement, tricuspid aortic valve with aortic annular calcification noted, trivial mitral regurgitation, normal size and structure aorta.  Patient comes in doing well today.  She denies any further episodes of chest pain, shortness of breath, palpitations, dizziness, presyncope, syncope.  No lower extremity swelling, abdominal distention, orthopnea, PND, early satiety.  She does note some fatigue though attributes this to a relatively sedentary lifestyle.  No falls, BRBPR, melena.  Tolerating anticoagulation without issues.  Labs: 12/2018 - Triglyceride 85, direct LDL 46, WBC 6.1, Hgb 14.5, PLT 403, serum creatinine 0.93, potassium 4.9, albumin 4.3, AST/ALT normal    Past Medical History:  Diagnosis Date   History of stress test    a. 02/2013 Nl stress test.   Hypertension    Insomnia, unspecified    Mitral regurgitation    a. echo 01/2016: nl LV sys fxn, mild MR, w/o pulm htn, nl atrial size   Osteoporosis, unspecified    Paroxysmal atrial fibrillation ([place]) 01/2016   a. diagnosed 01/2016; b. has been on eliquis, pradaxa, and xarelto at varying times 2/2 cost of each medication-->currently on eliquis (11/2016); c. CHADS2VASc --> 3 (HTN, age x 1, female).   Pure hypercholesterolemia    TIA (transient ischemic attack)  Unspecified asthma(493.90)    Vertigo     Past Surgical History:  Procedure Laterality Date   ABDOMINAL HYSTERECTOMY     CATARACT EXTRACTION Right 08/31/2016   COLONOSCOPY WITH PROPOFOL N/A 03/04/2015   Procedure: COLONOSCOPY WITH PROPOFOL;  Surgeon: Lucilla Lame, MD;  Location: [facility] ENDOSCOPY;  Service: Endoscopy;  Laterality: N/A;    Current Meds  Medication Sig    amLODipine (NORVASC) 5 MG tablet Take 1.5 tablets (7.5 mg total) by mouth daily.   diltiazem (CARDIZEM) 30 MG tablet Take 1 tablet (30 mg total) by mouth every 6 (six) hours as needed (for tachycardia/recurrent Afib.).   ELIQUIS 5 MG TABS tablet TAKE 1 TABLET(5 MG) BY MOUTH TWICE DAILY   meclizine (ANTIVERT) 25 MG tablet Take 25 mg by mouth 3 (three) times daily as needed for dizziness.   rosuvastatin (CRESTOR) 10 MG tablet Take 1 tablet (10 mg total) by mouth daily.   Spacer/Aero-Hold Chamber Bags MISC 1 each by Does not apply route 2 (two) times daily.   SYMBICORT 160-4.5 MCG/ACT inhaler INHALE 2 PUFFS INTO THE LUNGS TWICE DAILY    Allergies:   Ace inhibitors and Penicillins   Social History:  The patient  reports that she has never smoked. She has never used smokeless tobacco. She reports that she does not drink alcohol or use drugs.   Family History:  The patient's family history includes Aneurysm in her mother; COPD in her father; Cancer in her brother; Healthy in her brother; Hypertension in her brother and mother; Stroke in her brother.  ROS:   Review of Systems  Constitutional: Positive for malaise/fatigue. Negative for chills, diaphoresis, fever and weight loss.  HENT: Negative for congestion.   Eyes: Negative for discharge and redness.  Respiratory: Negative for cough, hemoptysis, sputum production, shortness of breath and wheezing.   Cardiovascular: Negative for chest pain, palpitations, orthopnea, claudication, leg swelling and PND.  Gastrointestinal: Negative for abdominal pain, blood in stool, heartburn, melena, nausea and vomiting.  Genitourinary: Negative for hematuria.  Musculoskeletal: Negative for falls and myalgias.  Skin: Negative for rash.  Neurological: Negative for dizziness, tingling, tremors, sensory change, speech change, focal weakness, loss of consciousness and weakness.  Endo/Heme/Allergies: Does not bruise/bleed easily.  Psychiatric/Behavioral:  Negative for substance abuse. The patient is not nervous/anxious.   All other systems reviewed and are negative.    PHYSICAL EXAM:  VS:  BP 140/80 (BP Location: Left Arm, Patient Position: Sitting, Cuff Size: Normal)    Pulse (!) 51    Temp (!) 97.3 F (36.3 C)    Ht 5\' 3"  (1.6 m)    Wt 180 lb 4 oz (81.8 kg)    SpO2 96%    BMI 31.93 kg/m  BMI: Body mass index is 31.93 kg/m.  Physical Exam  Constitutional: She is oriented to person, place, and time. She appears well-developed and well-nourished.  HENT:  Head: Normocephalic and atraumatic.  Eyes: Right eye exhibits no discharge. Left eye exhibits no discharge.  Neck: Normal range of motion. No JVD present.  Cardiovascular: Regular rhythm, S1 normal, S2 normal and normal heart sounds. Bradycardia present. Exam reveals no distant heart sounds, no friction rub, no midsystolic click and no opening snap.  No murmur heard. Pulses:      Posterior tibial pulses are 2+ on the right side and 2+ on the left side.  With ambulation and her hall heart rate improved to 72 bpm demonstrating appropriate chronotropic response.  Pulmonary/Chest: Effort normal and breath sounds normal. No respiratory  distress. She has no decreased breath sounds. She has no wheezes. She has no rales. She exhibits no tenderness.  Abdominal: Soft. She exhibits no distension. There is no abdominal tenderness.  Musculoskeletal:        General: No edema.  Neurological: She is alert and oriented to person, place, and time.  Skin: Skin is warm and dry. No cyanosis. Nails show no clubbing.  Psychiatric: She has a normal mood and affect. Her speech is normal and behavior is normal. Judgment and thought content normal.     EKG:  Was ordered and interpreted by me today. Shows sinus bradycardia, 51 bpm, normal axis, no acute ST-T changes  Recent Labs: 01/09/2019: ALT 24; BUN 13; Creatinine, Ser 0.93; Hemoglobin 14.5; Platelets 403; Potassium 4.9; Sodium 141  01/09/2019: Chol/HDL Ratio  2.5; Cholesterol, Total 98; HDL 39; LDL Calculated 42; LDL Direct 46; Triglycerides 85   Estimated Creatinine Clearance: 53 mL/min (by C-G formula based on SCr of 0.93 mg/dL).   Wt Readings from Last 3 Encounters:  01/23/19 180 lb 4 oz (81.8 kg)  01/09/19 179 lb 8 oz (81.4 kg)  11/29/18 178 lb 6.4 oz (80.9 kg)     Other studies reviewed: Additional studies/records reviewed today include: summarized above  ASSESSMENT AND PLAN:  1. Atypical chest pain: No further issues.  Recent Myoview low risk.  Echo unrevealing as above.  Remains on Eliquis in place of aspirin.  Continue Crestor as outlined below.  No further ischemic work-up needed at this time.  2. PAF: Maintaining sinus rhythm with a mildly bradycardic heart rate.  Not on standing beta-blocker or non-dihydropyridine calcium channel blocker.  She has not needed any PRN short acting diltiazem for tachypalpitations recently.  She demonstrates appropriate chronotropic response with ambulation in our office today with heart rate trending to 72 bpm.  No indication for pacemaker.  Cannot exclude some potential component of amlodipine playing a role and her mild bradycardia though given hypertension and patient being asymptomatic we will continue this medication at this time.  Continue Eliquis 5 mg twice daily.  She denies any sense concerning for bleeding.  Recent CBC demonstrated stable hemoglobin.  Recommend she slowly increase activity level.  She has a stationary bike at home and will begin to ride that slowly.  3. HTN: Blood pressure remains mildly elevated today at 140/80.  Continue recently increased dose of amlodipine.  4. HLD: LDL of 46 from earlier this month.  Continue current dose of Crestor.  5. TIA: No further symptoms.  Remains on Eliquis as outlined above given underlying PAF.  Continue Crestor.  6. Mitral regurgitation: No murmur noted on exam.  Most recent echo earlier this month demonstrated trivial mitral regurgitation.   Follow-up with periodic echocardiogram.  Disposition: F/u with Dr. Fletcher Anon or an APP in 6 months, sooner if needed.  Current medicines are reviewed at length with the patient today.  The patient did not have any concerns regarding medicines.  Signed, Christell Faith, PA-C 01/23/2019 8:37 AM     [place] [address] [place] [address] [place], [place] [address] [phone]

## 2019-01-22 ENCOUNTER — Telehealth: Payer: Self-pay | Admitting: Cardiovascular Disease

## 2019-01-22 NOTE — Telephone Encounter (Signed)

## 2019-01-23 ENCOUNTER — Encounter: Payer: Self-pay | Admitting: Physician Assistant

## 2019-01-23 ENCOUNTER — Ambulatory Visit (INDEPENDENT_AMBULATORY_CARE_PROVIDER_SITE_OTHER): Payer: Medicare Other | Admitting: Physician Assistant

## 2019-01-23 ENCOUNTER — Other Ambulatory Visit: Payer: Self-pay

## 2019-01-23 VITALS — BP 140/80 | HR 72 | Temp 97.3°F | Ht 63.0 in | Wt 180.2 lb

## 2019-01-23 DIAGNOSIS — I10 Essential (primary) hypertension: Secondary | ICD-10-CM

## 2019-01-23 DIAGNOSIS — R001 Bradycardia, unspecified: Secondary | ICD-10-CM | POA: Diagnosis not present

## 2019-01-23 DIAGNOSIS — G459 Transient cerebral ischemic attack, unspecified: Secondary | ICD-10-CM

## 2019-01-23 DIAGNOSIS — I340 Nonrheumatic mitral (valve) insufficiency: Secondary | ICD-10-CM

## 2019-01-23 DIAGNOSIS — R0789 Other chest pain: Secondary | ICD-10-CM

## 2019-01-23 DIAGNOSIS — I480 Paroxysmal atrial fibrillation: Secondary | ICD-10-CM

## 2019-01-23 DIAGNOSIS — E785 Hyperlipidemia, unspecified: Secondary | ICD-10-CM | POA: Diagnosis not present

## 2019-01-23 NOTE — Patient Instructions (Signed)
Medication Instructions:  Your physician recommends that you continue on your current medications as directed. Please refer to the Current Medication list given to you today.  If you need a refill on your cardiac medications before your next appointment, please call your pharmacy.   Lab work: None ordered  If you have labs (blood work) drawn today and your tests are completely normal, you will receive your results only by: [name] MyChart Message (if you have MyChart) OR . A paper copy in the mail If you have any lab test that is abnormal or we need to change your treatment, we will call you to review the results.  Testing/Procedures: None ordered   Follow-Up: At [facility], you and your health needs are our priority.  As part of our continuing mission to provide you with exceptional heart care, we have created designated Provider Care Teams.  These Care Teams include your primary Cardiologist (physician) and Advanced Practice Providers (APPs -  Physician Assistants and Nurse Practitioners) who all work together to provide you with the care you need, when you need it. You will need a follow up appointment in 6 months.  Please call our office 2 months in advance to schedule this appointment.  You may see Dr. Fletcher Anon or Christell Faith, PA-C.

## 2019-01-30 ENCOUNTER — Other Ambulatory Visit: Payer: Self-pay

## 2019-01-30 ENCOUNTER — Ambulatory Visit
Admission: RE | Admit: 2019-01-30 | Discharge: 2019-01-30 | Disposition: A | Discharge status: Home / Self Care | Payer: Medicare Other | Source: Ambulatory Visit | Attending: Family Medicine | Admitting: Family Medicine

## 2019-01-30 ENCOUNTER — Ambulatory Visit (INDEPENDENT_AMBULATORY_CARE_PROVIDER_SITE_OTHER): Payer: Medicare Other

## 2019-01-30 ENCOUNTER — Telehealth: Payer: Self-pay | Admitting: Family Medicine

## 2019-01-30 VITALS — BP 128/82 | HR 71 | Temp 97.2°F | Resp 16 | Ht 63.0 in | Wt 180.2 lb

## 2019-01-30 DIAGNOSIS — Z0000 Encounter for general adult medical examination without abnormal findings: Secondary | ICD-10-CM

## 2019-01-30 DIAGNOSIS — Z1231 Encounter for screening mammogram for malignant neoplasm of breast: Secondary | ICD-10-CM | POA: Insufficient documentation

## 2019-01-30 NOTE — Progress Notes (Signed)
Subjective:   [name] is a 78 y.o. female who presents for Medicare Annual (Subsequent) preventive examination.  Review of Systems:   Cardiac Risk Factors include: advanced age (>34men, >99 women);dyslipidemia;hypertension;obesity (BMI >30kg/m2)     Objective:     Vitals: BP 128/82 (BP Location: Left Arm, Patient Position: Sitting, Cuff Size: Normal)   Pulse 71   Temp (!) 97.2 F (36.2 C) (Other (Comment)) Comment (Src): forehead  Resp 16   Ht 5\' 3"  (1.6 m)   Wt 180 lb 3.2 oz (81.7 kg)   SpO2 97%   BMI 31.92 kg/m   Body mass index is 31.92 kg/m.  Advanced Directives 01/30/2019 01/24/2018 01/22/2018 01/22/2018 01/22/2018 01/04/2017 01/03/2017  Does Patient Have a Medical Advance Directive? Yes No No No No No No  Type of Paramedic of [place];Living will - - - - - -  Copy of [place] in Chart? No - copy requested - - - - - -  Would patient like information on creating a medical advance directive? - Yes (MAU/Ambulatory/Procedural Areas - Information given) No - Patient declined - - - -    Tobacco Social History   Tobacco Use  Smoking Status Never Smoker  Smokeless Tobacco Never Used  Tobacco Comment   smoking cessation materials not required     Counseling given: Not Answered Comment: smoking cessation materials not required   Clinical Intake:  Pre-visit preparation completed: Yes  Pain : No/denies pain     BMI - recorded: 31.92 Nutritional Status: BMI > 30  Obese Nutritional Risks: None Diabetes: No  How often do you need to have someone help you when you read instructions, pamphlets, or other written materials from your doctor or pharmacy?: 1 - Never  Interpreter Needed?: No  Information entered by :: Clemetine Marker LPN  Past Medical History:  Diagnosis Date  . History of stress test    a. 02/2013 Nl stress test.  . Hypertension   . Insomnia, unspecified   . Mitral regurgitation    a. echo 01/2016: nl LV  sys fxn, mild MR, w/o pulm htn, nl atrial size  . Osteoporosis, unspecified   . Paroxysmal atrial fibrillation ([place]) 01/2016   a. diagnosed 01/2016; b. has been on eliquis, pradaxa, and xarelto at varying times 2/2 cost of each medication-->currently on eliquis (11/2016); c. CHADS2VASc --> 3 (HTN, age x 1, female).  . Pure hypercholesterolemia   . TIA (transient ischemic attack)   . Unspecified asthma(493.90)   . Vertigo    Past Surgical History:  Procedure Laterality Date  . ABDOMINAL HYSTERECTOMY    . CATARACT EXTRACTION Right 08/31/2016  . COLONOSCOPY WITH PROPOFOL N/A 03/04/2015   Procedure: COLONOSCOPY WITH PROPOFOL;  Surgeon: Lucilla Lame, MD;  Location: [facility] ENDOSCOPY;  Service: Endoscopy;  Laterality: N/A;   Family History  Problem Relation Age of Onset  . COPD Father   . Hypertension Mother   . Aneurysm Mother   . Hypertension Brother   . Stroke Brother   . Cancer Brother        pancreatic  . Healthy Brother    Social History   Socioeconomic History  . Marital status: Single    Spouse name: Not on file  . Number of children: 2  . Years of education: Not on file  . Highest education level: Associate degree: academic program  Occupational History  . Occupation: Retired  Scientific laboratory technician  . Financial resource strain: Not hard at all  .  Food insecurity    Worry: Never true    Inability: Never true  . Transportation needs    Medical: No    Non-medical: No  Tobacco Use  . Smoking status: Never Smoker  . Smokeless tobacco: Never Used  . Tobacco comment: smoking cessation materials not required  Substance and Sexual Activity  . Alcohol use: No    Alcohol/week: 0.0 standard drinks  . Drug use: No  . Sexual activity: Not Currently    Partners: Male  Lifestyle  . Physical activity    Days per week: 3 days    Minutes per session: 30 min  . Stress: Only a little  Relationships  . Social connections    Talks on phone: More than three times a week    Gets together:  More than three times a week    Attends religious service: More than 4 times per year    Active member of club or organization: Yes    Attends meetings of clubs or organizations: More than 4 times per year    Relationship status: Never married  Other Topics Concern  . Not on file  Social History Narrative   Independent at baseline. Lives at home with family    Outpatient Encounter Medications as of 01/30/2019  Medication Sig  . amLODipine (NORVASC) 5 MG tablet Take 1.5 tablets (7.5 mg total) by mouth daily.  [name] diltiazem (CARDIZEM) 30 MG tablet Take 1 tablet (30 mg total) by mouth every 6 (six) hours as needed (for tachycardia/recurrent Afib.).  [name] ELIQUIS 5 MG TABS tablet TAKE 1 TABLET(5 MG) BY MOUTH TWICE DAILY  . meclizine (ANTIVERT) 25 MG tablet Take 25 mg by mouth 3 (three) times daily as needed for dizziness.  . rosuvastatin (CRESTOR) 10 MG tablet Take 1 tablet (10 mg total) by mouth daily.  [name] Spacer/Aero-Hold Chamber Bags MISC 1 each by Does not apply route 2 (two) times daily.  . SYMBICORT 160-4.5 MCG/ACT inhaler INHALE 2 PUFFS INTO THE LUNGS TWICE DAILY   No facility-administered encounter medications on file as of 01/30/2019.     Activities of Daily Living In your present state of health, do you have any difficulty performing the following activities: 01/30/2019 11/29/2018  Hearing? N N  Comment declines hearing aids -  Vision? N Y  Comment wears glasses glasses  Difficulty concentrating or making decisions? N N  Walking or climbing stairs? N N  Dressing or bathing? N N  Doing errands, shopping? N N  Preparing Food and eating ? N -  Using the Toilet? N -  In the past six months, have you accidently leaked urine? N -  Do you have problems with loss of bowel control? N -  Managing your Medications? N -  Managing your Finances? N -  Housekeeping or managing your Housekeeping? N -  Some recent data might be hidden    Patient Care Team: Steele Sizer, MD as PCP - General  (Family Medicine) Wellington Hampshire, MD as Consulting Physician (Cardiology) Vladimir Crofts, MD as Consulting Physician (Neurology) Lucilla Lame, MD as Consulting Physician (Gastroenterology)    Assessment:   This is a routine wellness examination for [name].  Exercise Activities and Dietary recommendations Current Exercise Habits: Home exercise routine, Type of exercise: walking;Other - see comments(exercise bike), Time (Minutes): 30, Frequency (Times/Week): 3, Weekly Exercise (Minutes/Week): 90, Intensity: Moderate, Exercise limited by: None identified  Goals    . DIET - INCREASE WATER INTAKE     Recommend to drink  at least 6-8 8oz glasses of water per day.       Fall Risk Fall Risk  01/30/2019 11/29/2018 06/14/2018 06/12/2018 04/28/2018  Falls in the past year? 0 0 0 0 0  Number falls in past yr: 0 0 0 0 -  Injury with Fall? 0 0 0 0 -  Risk for fall due to : - - - - -  Risk for fall due to: Comment - - - - -  Follow up Falls prevention discussed - - - -   FALL RISK PREVENTION PERTAINING TO THE HOME:  Any stairs in or around the home? No  If so, do they handrails? No   Home free of loose throw rugs in walkways, pet beds, electrical cords, etc? Yes  Adequate lighting in your home to reduce risk of falls? Yes   ASSISTIVE DEVICES UTILIZED TO PREVENT FALLS:  Life alert? No  Use of a cane, walker or w/c? No  Grab bars in the bathroom? Yes  Shower chair or bench in shower? Yes  Elevated toilet seat or a handicapped toilet? No   DME ORDERS:  DME order needed?  No   TIMED UP AND GO:  Was the test performed? Yes .  Length of time to ambulate 10 feet: 6 sec.   GAIT:  Appearance of gait: Gait stead-fast and without the use of an assistive device.   Education: Fall risk prevention has been discussed.  Intervention(s) required? No   Depression Screen PHQ 2/9 Scores 01/30/2019 11/29/2018 06/14/2018 06/12/2018  PHQ - 2 Score 0 0 0 0  PHQ- 9 Score - 0 0 0     Cognitive  Function     6CIT Screen 01/30/2019 01/24/2018  What Year? 0 points 0 points  What month? 0 points 0 points  What time? 0 points 0 points  Count back from 20 0 points 0 points  Months in reverse 0 points 0 points  Repeat phrase 0 points 2 points  Total Score 0 2    Immunization History  Administered Date(s) Administered  . Influenza, High Dose Seasonal PF 02/20/2015, 05/11/2017, 05/04/2018  . Influenza-Unspecified 02/20/2016  . Pneumococcal Conjugate-13 10/17/2017    Qualifies for Shingles Vaccine? Yes . Due for Shingrix. Education has been provided regarding the importance of this vaccine. Pt has been advised to call insurance company to determine out of pocket expense. Advised may also receive vaccine at local pharmacy or Health Dept. Verbalized acceptance and understanding.  Tdap: Up to date  Flu Vaccine: Up to date  Pneumococcal Vaccine: Up to date    Screening Tests Health Maintenance  Topic Date Due  . MAMMOGRAM  12/29/2018  . INFLUENZA VACCINE  01/27/2019  . COLONOSCOPY  03/03/2020  . TETANUS/TDAP  02/21/2022  . DEXA SCAN  Completed  . PNA vac Low Risk Adult  Completed    Cancer Screenings:  Colorectal Screening: Completed 03/04/15. Repeat every 5 years  Mammogram: Completed 12/28/17. Repeat every year; Scheduled for 01/30/19  Bone Density: Completed 06/08/18. Results reflect NORMAL. Repeat every 2 years.   Lung Cancer Screening: (Low Dose CT Chest recommended if Age 25-80 years, 30 pack-year currently smoking OR have quit w/in 15years.) does not qualify.    Additional Screening:  Hepatitis C Screening: no longer required  Vision Screening: Recommended annual ophthalmology exams for early detection of glaucoma and other disorders of the eye. Is the patient up to date with their annual eye exam?  Yes  Who is the provider or what is  the name of the office in which the pt attends annual eye exams? [place] Screening: Recommended annual dental  exams for proper oral hygiene  Community Resource Referral:  CRR required this visit?  No      Plan:     I have personally reviewed and addressed the Medicare Annual Wellness questionnaire and have noted the following in the patient's chart:  A. Medical and social history B. Use of alcohol, tobacco or illicit drugs  C. Current medications and supplements D. Functional ability and status E.  Nutritional status F.  Physical activity G. Advance directives H. List of other physicians I.  Hospitalizations, surgeries, and ER visits in previous 12 months J.  [place] such as hearing and vision if needed, cognitive and depression L. Referrals and appointments   In addition, I have reviewed and discussed with patient certain preventive protocols, quality metrics, and best practice recommendations. A written personalized care plan for preventive services as well as general preventive health recommendations were provided to patient.   Signed,  Clemetine Marker, LPN Nurse Health Advisor   Nurse Notes: pt recently had labs done with Dr. Thurmond Butts but A1c not drawn. Pt aware and scheduled for follow up in 12/20.

## 2019-01-30 NOTE — Telephone Encounter (Signed)
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

## 2019-01-30 NOTE — Patient Instructions (Signed)
[name] , Thank you for taking time to come for your Medicare Wellness Visit. I appreciate your ongoing commitment to your health goals. Please review the following plan we discussed and let me know if I can assist you in the future.   Screening recommendations/referrals: Colonoscopy: done 03/04/15 Mammogram: done 12/28/17. Scheduled for 01/30/19. Bone Density: done 06/08/18 Recommended yearly ophthalmology/optometry visit for glaucoma screening and checkup Recommended yearly dental visit for hygiene and checkup  Vaccinations: Influenza vaccine: done 05/04/18 Pneumococcal vaccine: done 10/17/17 Tdap vaccine: done 02/22/12 Shingles vaccine: Shingrix discussed. Please contact your pharmacy for coverage information.    Advanced directives: Please bring a copy of your health care power of attorney and living will to the office at your convenience.  Conditions/risks identified: Recommend drinking 6-8 glasses of water per day  Next appointment: Please follow up in one year for your Medicare Annual Wellness visit.     Preventive Care 76 Years and Older, Female Preventive care refers to lifestyle choices and visits with your health care provider that can promote health and wellness. What does preventive care include?  A yearly physical exam. This is also called an annual well check.  Dental exams once or twice a year.  Routine eye exams. Ask your health care provider how often you should have your eyes checked.  Personal lifestyle choices, including:  Daily care of your teeth and gums.  Regular physical activity.  Eating a healthy diet.  Avoiding tobacco and drug use.  Limiting alcohol use.  Practicing safe sex.  Taking low-dose aspirin every day.  Taking vitamin and mineral supplements as recommended by your health care provider. What happens during an annual well check? The services and screenings done by your health care provider during your annual well check will depend on  your age, overall health, lifestyle risk factors, and family history of disease. Counseling  Your health care provider may ask you questions about your:  Alcohol use.  Tobacco use.  Drug use.  Emotional well-being.  Home and relationship well-being.  Sexual activity.  Eating habits.  History of falls.  Memory and ability to understand (cognition).  Work and work Statistician.  Reproductive health. Screening  You may have the following tests or measurements:  Height, weight, and BMI.  Blood pressure.  Lipid and cholesterol levels. These may be checked every 5 years, or more frequently if you are over 37 years old.  Skin check.  Lung cancer screening. You may have this screening every year starting at age 29 if you have a 30-pack-year history of smoking and currently smoke or have quit within the past 15 years.  Fecal occult blood test (FOBT) of the stool. You may have this test every year starting at age 62.  Flexible sigmoidoscopy or colonoscopy. You may have a sigmoidoscopy every 5 years or a colonoscopy every 10 years starting at age 64.  Hepatitis C blood test.  Hepatitis B blood test.  Sexually transmitted disease (STD) testing.  Diabetes screening. This is done by checking your blood sugar (glucose) after you have not eaten for a while (fasting). You may have this done every 1-3 years.  Bone density scan. This is done to screen for osteoporosis. You may have this done starting at age 50.  Mammogram. This may be done every 1-2 years. Talk to your health care provider about how often you should have regular mammograms. Talk with your health care provider about your test results, treatment options, and if necessary, the need for more  tests. Vaccines  Your health care provider may recommend certain vaccines, such as:  Influenza vaccine. This is recommended every year.  Tetanus, diphtheria, and acellular pertussis (Tdap, Td) vaccine. You may need a Td booster  every 10 years.  Zoster vaccine. You may need this after age 18.  Pneumococcal 13-valent conjugate (PCV13) vaccine. One dose is recommended after age 1.  Pneumococcal polysaccharide (PPSV23) vaccine. One dose is recommended after age 14. Talk to your health care provider about which screenings and vaccines you need and how often you need them. This information is not intended to replace advice given to you by your health care provider. Make sure you discuss any questions you have with your health care provider. Document Released: 07/11/2015 Document Revised: 03/03/2016 Document Reviewed: 04/15/2015 Elsevier Interactive Patient Education  2017 [place] Prevention in the Home Falls can cause injuries. They can happen to people of all ages. There are many things you can do to make your home safe and to help prevent falls. What can I do on the outside of my home?  Regularly fix the edges of walkways and driveways and fix any cracks.  Remove anything that might make you trip as you walk through a door, such as a raised step or threshold.  Trim any bushes or trees on the path to your home.  Use bright outdoor lighting.  Clear any walking paths of anything that might make someone trip, such as rocks or tools.  Regularly check to see if handrails are loose or broken. Make sure that both sides of any steps have handrails.  Any raised decks and porches should have guardrails on the edges.  Have any leaves, snow, or ice cleared regularly.  Use sand or salt on walking paths during winter.  Clean up any spills in your garage right away. This includes oil or grease spills. What can I do in the bathroom?  Use night lights.  Install grab bars by the toilet and in the tub and shower. Do not use towel bars as grab bars.  Use non-skid mats or decals in the tub or shower.  If you need to sit down in the shower, use a plastic, non-slip stool.  Keep the floor dry. Clean up any  water that spills on the floor as soon as it happens.  Remove soap buildup in the tub or shower regularly.  Attach bath mats securely with double-sided non-slip rug tape.  Do not have throw rugs and other things on the floor that can make you trip. What can I do in the bedroom?  Use night lights.  Make sure that you have a light by your bed that is easy to reach.  Do not use any sheets or blankets that are too big for your bed. They should not hang down onto the floor.  Have a firm chair that has side arms. You can use this for support while you get dressed.  Do not have throw rugs and other things on the floor that can make you trip. What can I do in the kitchen?  Clean up any spills right away.  Avoid walking on wet floors.  Keep items that you use a lot in easy-to-reach places.  If you need to reach something above you, use a strong step stool that has a grab bar.  Keep electrical cords out of the way.  Do not use floor polish or wax that makes floors slippery. If you must use wax, use non-skid floor  wax.  Do not have throw rugs and other things on the floor that can make you trip. What can I do with my stairs?  Do not leave any items on the stairs.  Make sure that there are handrails on both sides of the stairs and use them. Fix handrails that are broken or loose. Make sure that handrails are as long as the stairways.  Check any carpeting to make sure that it is firmly attached to the stairs. Fix any carpet that is loose or worn.  Avoid having throw rugs at the top or bottom of the stairs. If you do have throw rugs, attach them to the floor with carpet tape.  Make sure that you have a light switch at the top of the stairs and the bottom of the stairs. If you do not have them, ask someone to add them for you. What else can I do to help prevent falls?  Wear shoes that:  Do not have high heels.  Have rubber bottoms.  Are comfortable and fit you well.  Are closed  at the toe. Do not wear sandals.  If you use a stepladder:  Make sure that it is fully opened. Do not climb a closed stepladder.  Make sure that both sides of the stepladder are locked into place.  Ask someone to hold it for you, if possible.  Clearly mark and make sure that you can see:  Any grab bars or handrails.  First and last steps.  Where the edge of each step is.  Use tools that help you move around (mobility aids) if they are needed. These include:  Canes.  Walkers.  Scooters.  Crutches.  Turn on the lights when you go into a dark area. Replace any light bulbs as soon as they burn out.  Set up your furniture so you have a clear path. Avoid moving your furniture around.  If any of your floors are uneven, fix them.  If there are any pets around you, be aware of where they are.  Review your medicines with your doctor. Some medicines can make you feel dizzy. This can increase your chance of falling. Ask your doctor what other things that you can do to help prevent falls. This information is not intended to replace advice given to you by your health care provider. Make sure you discuss any questions you have with your health care provider. Document Released: 04/10/2009 Document Revised: 11/20/2015 Document Reviewed: 07/19/2014 Elsevier Interactive Patient Education  2017 [employer].

## 2019-01-30 NOTE — Telephone Encounter (Signed)
Are you ok with me doing this.

## 2019-02-07 ENCOUNTER — Telehealth: Payer: Self-pay | Admitting: Cardiovascular Disease

## 2019-02-07 NOTE — Telephone Encounter (Signed)
Patient calling in to see if any coupons or waivers are available for eliquis. Patient recently went to pharmacy and was made aware the price has increased.

## 2019-02-08 NOTE — Telephone Encounter (Signed)
Spoke with the patient. Patient was never given a 30 free card for Eliquis. Adv the patient that I will locate one and drop it in the mail for her.  Adv her that she could also apply for patient assistance for Eliquis through the manufacture. Also adv her that she can try different pharmacy's to see if she could get better pricing.  Pt sts that she will look around for better pricing and contact the office if she decides to apply for patient assistance. Patient voiced appreciation for the assistance.

## 2019-03-04 ENCOUNTER — Other Ambulatory Visit: Payer: Self-pay | Admitting: Family Medicine

## 2019-03-04 DIAGNOSIS — Z8673 Personal history of transient ischemic attack (TIA), and cerebral infarction without residual deficits: Secondary | ICD-10-CM

## 2019-03-04 DIAGNOSIS — E782 Mixed hyperlipidemia: Secondary | ICD-10-CM

## 2019-03-16 ENCOUNTER — Other Ambulatory Visit: Payer: Self-pay | Admitting: Physician Assistant

## 2019-03-16 DIAGNOSIS — I10 Essential (primary) hypertension: Secondary | ICD-10-CM

## 2019-04-16 ENCOUNTER — Ambulatory Visit (INDEPENDENT_AMBULATORY_CARE_PROVIDER_SITE_OTHER): Payer: Medicare Other

## 2019-04-16 ENCOUNTER — Other Ambulatory Visit: Payer: Self-pay

## 2019-04-16 DIAGNOSIS — Z23 Encounter for immunization: Secondary | ICD-10-CM

## 2019-05-03 ENCOUNTER — Telehealth: Payer: Self-pay | Admitting: Family Medicine

## 2019-05-03 NOTE — Chronic Care Management (AMB) (Addendum)
  Chronic Care Management   Note  05/03/2019 Name: [name] MRN: [id number] DOB: 1941-05-25  KAYELEE HERBIG is a 78 y.o. year old female who is a primary care patient of Steele Sizer, MD. I reached out to [name] by phone today in response to a referral sent by Ms. Peter Congo Winquist's health plan.     Ms. Schlauch was given information about Chronic Care Management services today including:  1. CCM service includes personalized support from designated clinical staff supervised by her physician, including individualized plan of care and coordination with other care providers 2. 24/7 contact phone numbers for assistance for urgent and routine care needs. 3. Service will only be billed when office clinical staff spend 20 minutes or more in a month to coordinate care. 4. Only one practitioner may furnish and bill the service in a calendar month. 5. The patient may stop CCM services at any time (effective at the end of the month) by phone call to the office staff. 6. The patient will be responsible for cost sharing (co-pay) of up to 20% of the service fee (after annual deductible is met).  Patient did not agree to enrollment in care management services and does not wish to consider at this time.  Follow up plan: The patient has been provided with contact information for the chronic care management team and has been advised to call with any health related questions or concerns.   [place]  [email or web address]   ??[phone]

## 2019-05-23 ENCOUNTER — Telehealth: Payer: Medicare Other

## 2019-05-31 ENCOUNTER — Other Ambulatory Visit: Payer: Self-pay

## 2019-05-31 ENCOUNTER — Encounter: Payer: Self-pay | Admitting: Family Medicine

## 2019-05-31 ENCOUNTER — Ambulatory Visit (INDEPENDENT_AMBULATORY_CARE_PROVIDER_SITE_OTHER): Payer: Medicare Other | Admitting: Family Medicine

## 2019-05-31 VITALS — BP 140/82 | HR 68 | Temp 96.6°F | Resp 16 | Ht 63.0 in | Wt 180.4 lb

## 2019-05-31 DIAGNOSIS — I10 Essential (primary) hypertension: Secondary | ICD-10-CM | POA: Diagnosis not present

## 2019-05-31 DIAGNOSIS — Z8673 Personal history of transient ischemic attack (TIA), and cerebral infarction without residual deficits: Secondary | ICD-10-CM | POA: Diagnosis not present

## 2019-05-31 DIAGNOSIS — I480 Paroxysmal atrial fibrillation: Secondary | ICD-10-CM

## 2019-05-31 DIAGNOSIS — G4733 Obstructive sleep apnea (adult) (pediatric): Secondary | ICD-10-CM | POA: Diagnosis not present

## 2019-05-31 DIAGNOSIS — E782 Mixed hyperlipidemia: Secondary | ICD-10-CM

## 2019-05-31 DIAGNOSIS — R42 Dizziness and giddiness: Secondary | ICD-10-CM | POA: Diagnosis not present

## 2019-05-31 DIAGNOSIS — J4520 Mild intermittent asthma, uncomplicated: Secondary | ICD-10-CM

## 2019-05-31 DIAGNOSIS — R739 Hyperglycemia, unspecified: Secondary | ICD-10-CM

## 2019-05-31 DIAGNOSIS — I729 Aneurysm of unspecified site: Secondary | ICD-10-CM

## 2019-05-31 DIAGNOSIS — E66811 Obesity, class 1: Secondary | ICD-10-CM

## 2019-05-31 DIAGNOSIS — E669 Obesity, unspecified: Secondary | ICD-10-CM | POA: Diagnosis not present

## 2019-05-31 DIAGNOSIS — R7303 Prediabetes: Secondary | ICD-10-CM | POA: Diagnosis not present

## 2019-05-31 DIAGNOSIS — Z23 Encounter for immunization: Secondary | ICD-10-CM

## 2019-05-31 LAB — POCT GLYCOSYLATED HEMOGLOBIN (HGB A1C): Hemoglobin A1C: 5.9 % — AB (ref 4.0–5.6)

## 2019-05-31 MED ORDER — ROSUVASTATIN CALCIUM 10 MG PO TABS: 10.0000 mg | ORAL_TABLET | Freq: Every day | ORAL | 1 refills | Status: DC

## 2019-05-31 MED ORDER — AMLODIPINE BESYLATE 10 MG PO TABS: 10.0000 mg | ORAL_TABLET | Freq: Every day | ORAL | 0 refills | Status: DC

## 2019-05-31 MED ORDER — PROMETHAZINE HCL 12.5 MG PO TABS: 12.5000 mg | ORAL_TABLET | Freq: Three times a day (TID) | ORAL | 0 refills | Status: DC | PRN

## 2019-05-31 NOTE — Progress Notes (Signed)
Name: [name]   MRN: [id number]    DOB: 1941/04/02   Date:05/31/2019       Progress Note  Subjective  Chief Complaint  Chief Complaint  Patient presents with  . Hypertension  . Atrial Fibrillation  . Follow-up    6 month F/U    HPI  HTN:she is now on norvasc,she denies chest pain or palpitation. She also denies dizziness.BP today was high but at home it is around 130/80's. Last visit with cardiologist it was 140/80, cardiologist wants bp below 130/80 and dose of norvasc was adjusted to 7.5 mg we will go up to 10 mg today . No side effects   Pre-diabetes: hgb A1C was 6.1% Denies polyphagia, polydipsia or polyuria. She has been trying to avoid carbs. We will recheck A1C today   Vertigo: she has vertigo with head movement, motion sickness, and when she has an episode she needs to go to [facility] by EMS because of severe nausea, vomiting and diarrhea01/2018. She had one recent episode this Summer but resolved by itself . She states promethazine is more helpful than Meclizine and would like to have some at home   Paroxysmal Afib: sees Dr. Fletcher Anon, symptoms started August 2017, she is onEliquisand prn Cardizem.Norecent episodes of fluttering or chest pain - back in July she had one episode , saw cardiologist and had repeat Echo, showed normal EF, mild right atrial dilation .   History of CVA/TIA and also aneursym of left ophthalmic artery , under the care of Dr. Manuella Ghazi - neurologist She is now taking Crestor and last LDL was excellent and no side effects. She noticed some right side dull headache and felt like right eye was moving slowly, but not observed by anyone else, lasted about 24 hours intermittently. It happened a couple of weeks ago , no double vision. Advised follow up with neurologist   MR angio head without contrast from 09/08/2018:  1. 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 2. Atherosclerosis without significant intracranial artery  stenosis  Asthma: she states she has been doing well, only uses Symbicort about 4 times a week when she thinks about, she tends to forget since feeling well, no cough, wheezing or SOB at this time. Explained symbicort is only valid for 60 days after she opens package   OSA: seen by Dr. Manuella Ghazi - neurologist , unable to tolerate CPAP, sleep study done 06/30/2018, she was supposed to have an oral appliance but appointment with dentist was cancelled and she also states too expensive . Unchanged      Patient Active Problem List   Diagnosis Date Noted  . Aneurysm ([place]) 02/08/2018  . History of CVA (cerebrovascular accident) without residual deficits 01/24/2018  . 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 TIA (transient ischemic attack) 01/22/2018  . Impingement syndrome of shoulder region 01/02/2018  . History of cataract surgery, right 09/01/2016  . Paroxysmal atrial fibrillation ([place]) 02/24/2016  . Vertigo 02/24/2016  . Low HDL (under 40) 02/17/2016  . Dizziness 02/15/2016  . Hx of colonic polyps   . Benign neoplasm of sigmoid colon   . Asthma, mild intermittent 02/21/2015  . Hyperglycemia 02/21/2015  . Essential hypertension 02/20/2015  . Inconclusive mammogram due to dense breasts 01/02/2015  . Dense breast 01/02/2015  . Abnormal EKG 02/23/2013    Past Surgical History:  Procedure Laterality Date  . ABDOMINAL HYSTERECTOMY    . CATARACT EXTRACTION Right 08/31/2016  . COLONOSCOPY WITH PROPOFOL N/A 03/04/2015  Procedure: COLONOSCOPY WITH PROPOFOL;  Surgeon: Lucilla Lame, MD;  Location: [facility] ENDOSCOPY;  Service: Endoscopy;  Laterality: N/A;    Family History  Problem Relation Age of Onset  . COPD Father   . Hypertension Mother   . Aneurysm Mother   . Hypertension Brother   . Stroke Brother   . Cancer Brother        pancreatic  . Healthy Brother     Social History   Socioeconomic History  . Marital status: Single    Spouse name: Not on file  . Number of children: 2   . Years of education: Not on file  . Highest education level: Associate degree: academic program  Occupational History  . Occupation: Retired  Scientific laboratory technician  . Financial resource strain: Not hard at all  . Food insecurity    Worry: Never true    Inability: Never true  . Transportation needs    Medical: No    Non-medical: No  Tobacco Use  . Smoking status: Never Smoker  . Smokeless tobacco: Never Used  . Tobacco comment: smoking cessation materials not required  Substance and Sexual Activity  . Alcohol use: No    Alcohol/week: 0.0 standard drinks  . Drug use: No  . Sexual activity: Not Currently    Partners: Male  Lifestyle  . Physical activity    Days per week: 3 days    Minutes per session: 30 min  . Stress: Only a little  Relationships  . Social connections    Talks on phone: More than three times a week    Gets together: More than three times a week    Attends religious service: More than 4 times per year    Active member of club or organization: Yes    Attends meetings of clubs or organizations: More than 4 times per year    Relationship status: Never married  . Intimate partner violence    Fear of current or ex partner: No    Emotionally abused: No    Physically abused: No    Forced sexual activity: No  Other Topics Concern  . Not on file  Social History Narrative   Independent at baseline. Lives at home with family     Current Outpatient Medications:  .  amLODipine (NORVASC) 5 MG tablet, TAKE 1 AND 1/2 TABLETS(7.5 MG) BY MOUTH DAILY, Disp: 135 tablet, Rfl: 0 .  diltiazem (CARDIZEM) 30 MG tablet, Take 1 tablet (30 mg total) by mouth every 6 (six) hours as needed (for tachycardia/recurrent Afib.)., Disp: 30 tablet, Rfl: 3 .  ELIQUIS 5 MG TABS tablet, TAKE 1 TABLET(5 MG) BY MOUTH TWICE DAILY, Disp: 60 tablet, Rfl: 6 .  meclizine (ANTIVERT) 25 MG tablet, Take 25 mg by mouth 3 (three) times daily as needed for dizziness., Disp: , Rfl:  .  rosuvastatin (CRESTOR)  10 MG tablet, TAKE 1 TABLET(10 MG) BY MOUTH DAILY, Disp: 90 tablet, Rfl: 0 .  Spacer/Aero-Hold Chamber Bags MISC, 1 each by Does not apply route 2 (two) times daily., Disp: 1 each, Rfl: 0 .  SYMBICORT 160-4.5 MCG/ACT inhaler, INHALE 2 PUFFS INTO THE LUNGS TWICE DAILY, Disp: 10.2 g, Rfl: 0  Allergies  Allergen Reactions  . Ace Inhibitors Swelling    Patient states her tongue swell.  [name] Penicillins Anxiety    Has patient had a PCN reaction causing immediate rash, facial/tongue/throat swelling, SOB or lightheadedness with hypotension: No Has patient had a PCN reaction causing severe rash involving mucus membranes or  skin necrosis: No Has patient had a PCN reaction that required hospitalization No Has patient had a PCN reaction occurring within the last 10 years: No If all of the above answers are "NO", then may proceed with Cephalosporin use.    I personally reviewed active problem list, medication list, allergies, family history, social history, health maintenance with the patient/caregiver today.   ROS  Constitutional: Negative for fever or weight change.  Respiratory: Negative for cough and shortness of breath.   Cardiovascular: Negative for chest pain or palpitations.  Gastrointestinal: Negative for abdominal pain, no bowel changes.  Musculoskeletal: Negative for gait problem or joint swelling.  Skin: Negative for rash.  Neurological: Negative for dizziness or headache.  No other specific complaints in a complete review of systems (except as listed in HPI above). Objective  Vitals:   05/31/19 0835  BP: (!) 150/80  Pulse: 68  Resp: 16  Temp: (!) 96.6 F (35.9 C)  TempSrc: Temporal  SpO2: 97%  Weight: 180 lb 6.4 oz (81.8 kg)  Height: 5\' 3"  (1.6 m)    Body mass index is 31.96 kg/m.  Physical Exam  Constitutional: Patient appears well-developed and well-nourished. Obese  No distress.  HEENT: head atraumatic, normocephalic, pupils equal and reactive to  light Cardiovascular: Normal rate, regular rhythm and normal heart sounds.  No murmur heard. No BLE edema. Pulmonary/Chest: Effort normal and breath sounds normal. No respiratory distress. Abdominal: Soft.  There is no tenderness. Psychiatric: Patient has a normal mood and affect. behavior is normal. Judgment and thought content normal.  PHQ2/9: Depression screen [facility] 2/9 05/31/2019 01/30/2019 11/29/2018 06/14/2018 06/12/2018  Decreased Interest 0 0 0 0 0  Down, Depressed, Hopeless 0 0 0 0 0  PHQ - 2 Score 0 0 0 0 0  Altered sleeping 0 - 0 0 0  Tired, decreased energy 0 - 0 0 0  Change in appetite 0 - 0 0 0  Feeling bad or failure about yourself  0 - 0 0 0  Trouble concentrating 0 - 0 0 0  Moving slowly or fidgety/restless 0 - 0 0 0  Suicidal thoughts 0 - 0 0 0  PHQ-9 Score 0 - 0 0 0  Difficult doing work/chores - - Not difficult at all Not difficult at all Not difficult at all    phq 9 is negative   Fall Risk: Fall Risk  05/31/2019 01/30/2019 11/29/2018 06/14/2018 06/12/2018  Falls in the past year? 0 0 0 0 0  Number falls in past yr: 0 0 0 0 0  Injury with Fall? 0 0 0 0 0  Risk for fall due to : - - - - -  Risk for fall due to: Comment - - - - -  Follow up - Falls prevention discussed - - -    Functional Status Survey: Is the patient deaf or have difficulty hearing?: No Does the patient have difficulty seeing, even when wearing glasses/contacts?: No Does the patient have difficulty concentrating, remembering, or making decisions?: No Does the patient have difficulty walking or climbing stairs?: No Does the patient have difficulty dressing or bathing?: No Does the patient have difficulty doing errands alone such as visiting a doctor's office or shopping?: No   Assessment & Plan  1. Aneurysm [facility])  She is going to follow up with Neurologist   2. Paroxysmal atrial fibrillation (HCC)  Rate controlled   3. Essential hypertension  - amLODipine (NORVASC) 10 MG tablet; Take 1  tablet (10 mg total) by  mouth daily.  Dispense: 90 tablet; Refill: 0  4. OSA (obstructive sleep apnea)   5. Mild intermittent asthma without complication   6. History of TIA (transient ischemic attack)  On statin therapy   7. Pre-diabetes  - POCT glycosylated hemoglobin (Hb A1C)  8. Obesity (BMI 30.0-34.9)  Discussed with the patient the risk posed by an increased BMI. Discussed importance of portion control, calorie counting and at least 150 minutes of physical activity weekly. Avoid sweet beverages and drink more water. Eat at least 6 servings of fruit and vegetables daily   9. Hyperglycemia  - POCT glycosylated hemoglobin (Hb A1C)  10. History of CVA (cerebrovascular accident) without residual deficits  - rosuvastatin (CRESTOR) 10 MG tablet; Take 1 tablet (10 mg total) by mouth daily.  Dispense: 90 tablet; Refill: 1  11. Mixed hyperlipidemia  - rosuvastatin (CRESTOR) 10 MG tablet; Take 1 tablet (10 mg total) by mouth daily.  Dispense: 90 tablet; Refill: 1  12. Vertigo  - promethazine (PHENERGAN) 12.5 MG tablet; Take 1 tablet (12.5 mg total) by mouth every 8 (eight) hours as needed for nausea or vomiting.  Dispense: 6 tablet; Refill: 0   13. Need for vaccination for pneumococcus  - Pneumococcal polysaccharide vaccine 23-valent greater than or equal to 2yo subcutaneous/IM

## 2019-06-15 ENCOUNTER — Ambulatory Visit (INDEPENDENT_AMBULATORY_CARE_PROVIDER_SITE_OTHER): Payer: Medicare Other

## 2019-06-15 ENCOUNTER — Other Ambulatory Visit: Payer: Self-pay

## 2019-06-15 DIAGNOSIS — I10 Essential (primary) hypertension: Secondary | ICD-10-CM

## 2019-06-15 DIAGNOSIS — I480 Paroxysmal atrial fibrillation: Secondary | ICD-10-CM | POA: Diagnosis not present

## 2019-06-15 NOTE — Chronic Care Management (AMB) (Signed)
Chronic Care Management   Initial Visit Note  06/15/2019 Name: [name] MRN: [id number] DOB: 07-Aug-1940  Primary Care Provider: Steele Sizer, MD Reason for referral : Chronic Care Management   [name] is a 78 y.o. year old female who is a primary care patient of Steele Sizer, MD. The CCM team was consulted for assistance with chronic disease management and care coordination. The primary focus of our conversation today was hypertension management and atrial fibrillation.  Review of patient's status, including review of consultants reports, relevant labs and test results was conducted today. Collaboration with appropriate care team members was performed as part of the comprehensive evaluation and provision of chronic care management services.    SDOH (Social Determinants of Health) screening performed today. See Care Plan for related entries.   Medications: Outpatient Encounter Medications as of 06/15/2019  Medication Sig  . amLODipine (NORVASC) 10 MG tablet Take 1 tablet (10 mg total) by mouth daily.  [name] diltiazem (CARDIZEM) 30 MG tablet Take 1 tablet (30 mg total) by mouth every 6 (six) hours as needed (for tachycardia/recurrent Afib.).  [name] ELIQUIS 5 MG TABS tablet TAKE 1 TABLET(5 MG) BY MOUTH TWICE DAILY  . meclizine (ANTIVERT) 25 MG tablet Take 25 mg by mouth 3 (three) times daily as needed for dizziness.  . promethazine (PHENERGAN) 12.5 MG tablet Take 1 tablet (12.5 mg total) by mouth every 8 (eight) hours as needed for nausea or vomiting.  . rosuvastatin (CRESTOR) 10 MG tablet Take 1 tablet (10 mg total) by mouth daily.  . SYMBICORT 160-4.5 MCG/ACT inhaler INHALE 2 PUFFS INTO THE LUNGS TWICE DAILY  . Spacer/Aero-Hold Chamber Bags MISC 1 each by Does not apply route 2 (two) times daily.   No facility-administered encounter medications on file as of 06/15/2019.     Objective:  BP Readings from Last 3 Encounters:  05/31/19 140/82  01/30/19 128/82  01/23/19  140/80    Wt Readings from Last 3 Encounters:  05/31/19 180 lb 6.4 oz (81.8 kg)  01/30/19 180 lb 3.2 oz (81.7 kg)  01/23/19 180 lb 4 oz (81.8 kg)    Depression screen [facility] 2/9 06/15/2019 05/31/2019 01/30/2019  Decreased Interest 0 0 0  Down, Depressed, Hopeless 0 0 0  PHQ - 2 Score 0 0 0  Altered sleeping - 0 -  Tired, decreased energy - 0 -  Change in appetite - 0 -  Feeling bad or failure about yourself  - 0 -  Trouble concentrating - 0 -  Moving slowly or fidgety/restless - 0 -  Suicidal thoughts - 0 -  PHQ-9 Score - 0 -  Difficult doing work/chores - - -    Fall Risk  06/15/2019 05/31/2019 01/30/2019 11/29/2018 06/14/2018  Falls in the past year? 0 0 0 0 0  Number falls in past yr: - 0 0 0 0  Injury with Fall? - 0 0 0 0  Risk for fall due to : Medication side effect - - - -  Risk for fall due to: Comment - - - - -  Follow up Falls prevention discussed - Falls prevention discussed - -    Functional Status Survey: Is the patient deaf or have difficulty hearing?: No Does the patient have difficulty seeing, even when wearing glasses/contacts?: No Does the patient have difficulty concentrating, remembering, or making decisions?: No Does the patient have difficulty walking or climbing stairs?: No Does the patient have difficulty dressing or bathing?: No Does the patient have difficulty doing errands  alone such as visiting a doctor's office or shopping?: No   Goals Addressed            This Visit's Progress   . Improve Ability to Self-Manage Chronic Illnesses       Current Barriers:  . Chronic Disease Management support and education needs related to Hypertension and Atrial Fibrillation. Patient with history of TIA.  Nurse Case Manager Clinical Goal(s):  [name] Over the next 60 days, patient will take all medications as prescribed. . Over the next 60 days, patient will complete provider appointments as scheduled . Over the next 60 days, patient will monitor her blood pressure.  . Over the next 60 days, patient will comply with cardiac prudent/heart healthy diet. . Over the next 30 days, patient will inform care management team if medication assistance is needed.    Interventions:  . Reviewed medications and discussed indications for use. Patient encouraged to take all medications as prescribed. Encouraged to notify MD if unable to tolerate prescribed regimen. Encouraged to notify care management team with concerns regarding prescription costs. . Encouraged patient to monitor her blood pressure daily and record readings. Encouraged to notify MD if readings are outside of established parameters. . Reviewed s/sx of complications related to cardiac health. Discussed indications for seeking immediate medical attention. . Encouraged patient to participate in mild exercise/walking when tolerated. Encouraged to continue compliance with heart healthy/cardiac prudent diet. . Reviewed schedule and upcoming provider appointments. Patient encouraged to attend appointments including telephonic outreach as scheduled to prevent delays in care. . Discussed plans for ongoing care management follow up. Provided patient with direct contact information for care management team   Patient Self Care Activities:  . Self administers medications . Attends provider appointments . Calls pharmacy for medication refills . Performs ADL's independently . Performs IADL's independently   Initial goal documentation           Ms. Dowda was given information about Chronic Care Management services including:  1. CCM service includes personalized support from designated clinical staff supervised by her physician, including individualized plan of care and coordination with other care providers 2. 24/7 contact phone numbers for assistance for urgent and routine care needs. 3. Service will only be billed when office clinical staff spend 20 minutes or more in a month to coordinate care. 4. Only  one practitioner may furnish and bill the service in a calendar month. 5. The patient may stop CCM services at any time (effective at the end of the month) by phone call to the office staff. 6. The patient will be responsible for cost sharing (co-pay) of up to 20% of the service fee (after annual deductible is met).  Patient agreed to services and verbal consent obtained.    PLAN The care management team will reach out to Ms. Pieroni within 30 days. Care management team contact information was provided. Ms. Allerton was encouraged to call if needed prior to the next scheduled outreach.    [facility] Care Management [phone]

## 2019-06-27 DIAGNOSIS — H26491 Other secondary cataract, right eye: Secondary | ICD-10-CM | POA: Diagnosis not present

## 2019-06-27 DIAGNOSIS — H35033 Hypertensive retinopathy, bilateral: Secondary | ICD-10-CM | POA: Diagnosis not present

## 2019-07-06 DIAGNOSIS — R519 Headache, unspecified: Secondary | ICD-10-CM | POA: Diagnosis not present

## 2019-07-09 DIAGNOSIS — H26491 Other secondary cataract, right eye: Secondary | ICD-10-CM | POA: Diagnosis not present

## 2019-07-13 ENCOUNTER — Other Ambulatory Visit: Payer: Self-pay

## 2019-07-13 ENCOUNTER — Ambulatory Visit: Payer: Self-pay

## 2019-07-13 NOTE — Chronic Care Management (AMB) (Signed)
Chronic Care Management   Follow Up Note   07/13/2019 Name: KIMIYA MATOTT MRN: [id number] DOB: 12/03/1940  Primary Care Provider: Steele Sizer, MD Reason for referral : Chronic Care Management   CLARIA GIARRUSSO is a 79 y.o. year old female who is a primary care patient of Steele Sizer, MD. The CCM team was consulted for assistance with chronic disease management and care coordination needs.    Review of patient's status, including review of consultants reports, relevant labs and test results was conducted today. Collaboration with appropriate care team members was performed as part of the comprehensive evaluation and provision of chronic care management services.      Outpatient Encounter Medications as of 07/13/2019  Medication Sig  . amLODipine (NORVASC) 10 MG tablet Take 1 tablet (10 mg total) by mouth daily.  [name] diltiazem (CARDIZEM) 30 MG tablet Take 1 tablet (30 mg total) by mouth every 6 (six) hours as needed (for tachycardia/recurrent Afib.).  [name] ELIQUIS 5 MG TABS tablet TAKE 1 TABLET(5 MG) BY MOUTH TWICE DAILY  . meclizine (ANTIVERT) 25 MG tablet Take 25 mg by mouth 3 (three) times daily as needed for dizziness.  . promethazine (PHENERGAN) 12.5 MG tablet Take 1 tablet (12.5 mg total) by mouth every 8 (eight) hours as needed for nausea or vomiting.  . rosuvastatin (CRESTOR) 10 MG tablet Take 1 tablet (10 mg total) by mouth daily.  [name] Spacer/Aero-Hold Chamber Bags MISC 1 each by Does not apply route 2 (two) times daily.  . SYMBICORT 160-4.5 MCG/ACT inhaler INHALE 2 PUFFS INTO THE LUNGS TWICE DAILY   No facility-administered encounter medications on file as of 07/13/2019.      Goals Addressed            This Visit's Progress   . Improve Ability to Self-Manage Chronic Illnesses   On track    Current Barriers:  . Chronic Disease Management support and education needs related to Hypertension and Atrial Fibrillation. Patient with history of TIA.  Nurse Case Manager  Clinical Goal(s):  [name] Over the next 60 days, patient will take all medications as prescribed. . Over the next 60 days, patient will complete provider appointments as scheduled . Over the next 60 days, patient will monitor her blood pressure. . Over the next 60 days, patient will comply with cardiac prudent/heart healthy diet. . Over the next 30 days, patient will inform care management team if medication assistance is needed.    Interventions:  . Reviewed medications and discussed indications for use. Patient encouraged to take all medications as prescribed. Encouraged to notify MD if unable to tolerate prescribed regimen. Discussed concerns regarding cost of Eliquis. Patient agreeable to outreach from [facility] Pharmacist to discuss options. . Encouraged patient to continue monitoring her blood pressure daily and recording readings. Encouraged to notify MD if readings are outside of established parameters. Reports reading today was 138/81. Reports systolic ranges in the [id number] over the past month. Reports diastolic readings in the [id number] . Reviewed s/sx of complications related to cardiac health. Discussed indications for seeking immediate medical attention. . Encouraged patient to participate in mild exercise/walking when tolerated. Encouraged to continue compliance with heart healthy/cardiac prudent diet. . Reviewed schedule and upcoming provider appointments. Patient encouraged to attend appointments including telephonic outreach as scheduled to prevent delays in care. Patient completed outreach with Neurologist as scheduled on 07/06/19. [name] Discussed plans for ongoing care management follow up. Patient encouraged to contact care management team with health related concerns as needed.  Patient Self Care Activities:  . Self administers medications . Attends provider appointments . Calls pharmacy for medication refills . Performs ADL's independently . Performs IADL's independently   Please see  past updates related to this goal by clicking on the "Past Updates" button in the selected goal         PLAN -Will discuss concerns regarding prescription costs with CCM Pharmacist. -The CCM Nurse Case Manager will reach out to Ms. Schoenberger again within the next month.    [facility] Care Management [phone]

## 2019-07-16 NOTE — Patient Instructions (Signed)
Thank you for allowing the Chronic Care Management team to participate in your care.  Goals Addressed            This Visit's Progress   . Improve Ability to Self-Manage Chronic Illnesses   On track    Current Barriers:  . Chronic Disease Management support and education needs related to Hypertension and Atrial Fibrillation. Patient with history of TIA.  Nurse Case Manager Clinical Goal(s):  [name] Over the next 60 days, patient will take all medications as prescribed. . Over the next 60 days, patient will complete provider appointments as scheduled . Over the next 60 days, patient will monitor her blood pressure. . Over the next 60 days, patient will comply with cardiac prudent/heart healthy diet. . Over the next 30 days, patient will inform care management team if medication assistance is needed.    Interventions:  . Reviewed medications and discussed indications for use. Patient encouraged to take all medications as prescribed. Encouraged to notify MD if unable to tolerate prescribed regimen. Discussed concerns regarding cost of Eliquis. Patient agreeable to outreach from [facility] Pharmacist to discuss options. . Encouraged patient to continue monitoring her blood pressure daily and recording readings. Encouraged to notify MD if readings are outside of established parameters. Reports reading today was 138/81. Reports systolic ranges in the [id number] over the past month. Reports diastolic readings in the [id number] . Reviewed s/sx of complications related to cardiac health. Discussed indications for seeking immediate medical attention. . Encouraged patient to participate in mild exercise/walking when tolerated. Encouraged to continue compliance with heart healthy/cardiac prudent diet. . Reviewed schedule and upcoming provider appointments. Patient encouraged to attend appointments including telephonic outreach as scheduled to prevent delays in care. Patient completed outreach with Neurologist as  scheduled on 07/06/19. [name] Discussed plans for ongoing care management follow up. Patient encouraged to contact care management team with health related concerns as needed.   Patient Self Care Activities:  . Self administers medications . Attends provider appointments . Calls pharmacy for medication refills . Performs ADL's independently . Performs IADL's independently   Please see past updates related to this goal by clicking on the "Past Updates" button in the selected goal          Ms. Simonich verbalized understanding of instructions provided during the telephonic outreach today. She declined need for a mailed/printed copy of the instructions.    The care management team will reach out to Ms. Messer again within the next month.   [facility] Care [facility] [phone]

## 2019-07-16 NOTE — Patient Instructions (Addendum)
Thank you for allowing the Chronic Care Management team to participate in your care.  Goals Addressed            This Visit's Progress   . Improve Ability to Self-Manage Chronic Illnesses       Current Barriers:  . Chronic Disease Management support and education needs related to Hypertension and Atrial Fibrillation. Patient with history of TIA.  Nurse Case Manager Clinical Goal(s):  [name] Over the next 60 days, patient will take all medications as prescribed. . Over the next 60 days, patient will complete provider appointments as scheduled . Over the next 60 days, patient will monitor her blood pressure. . Over the next 60 days, patient will comply with cardiac prudent/heart healthy diet. . Over the next 30 days, patient will inform care management team if medication assistance is needed.    Interventions:  . Reviewed medications and discussed indications for use. Patient encouraged to take all medications as prescribed. Encouraged to notify MD if unable to tolerate prescribed regimen. Encouraged to notify care management team with concerns regarding prescription costs. . Encouraged patient to monitor her blood pressure daily and record readings. Encouraged to notify MD if readings are outside of established parameters. . Reviewed s/sx of complications related to cardiac health. Discussed indications for seeking immediate medical attention. . Encouraged patient to participate in mild exercise/walking when tolerated. Encouraged to continue compliance with heart healthy/cardiac prudent diet. . Reviewed schedule and upcoming provider appointments. Patient encouraged to attend appointments including telephonic outreach as scheduled to prevent delays in care. . Discussed plans for ongoing care management follow up. Provided patient with direct contact information for care management team   Patient Self Care Activities:  . Self administers medications . Attends provider appointments . Calls  pharmacy for medication refills . Performs ADL's independently . Performs IADL's independently   Initial goal documentation        [name] was given information about Chronic Care Management services  including:  1. CCM service includes personalized support from designated clinical staff supervised by her physician, including individualized plan of care and coordination with other care providers 2. 24/7 contact phone numbers for assistance for urgent and routine care needs. 3. Service will only be billed when office clinical staff spend 20 minutes or more in a month to coordinate care. 4. Only one practitioner may furnish and bill the service in a calendar month. 5. The patient may stop CCM services at any time (effective at the end of the month) by phone call to the office staff. 6. The patient will be responsible for cost sharing (co-pay) of up to 20% of the service fee (after annual deductible is met).   Patient agreed to services and verbal consent was obtained.   [name] verbalized understanding of the  instructions provided during the telephonic outreach today. She declined need for mailed/printed copies of the instruction materials.    The care management team will reach out to Ms. Szeliga again within the next 30 days.   [facility] [phone]

## 2019-07-17 ENCOUNTER — Ambulatory Visit: Payer: Self-pay | Admitting: Pharmacist

## 2019-07-17 NOTE — Chronic Care Management (AMB) (Signed)
  Chronic Care Management   Note  07/17/2019 Name: [name] MRN: [id number] DOB: 1941/06/04  79 y.o. year old female referred to [facility] clinical pharmacy services by Neldon Labella, RNCM for medication assistance- Eliquis. Chronic conditions include HTN, afib.  Was unable to reach patient via telephone today and have left HIPAA compliant voicemail asking patient to return my call. (unsuccessful outreach #1).  Follow up plan: A HIPPA compliant phone message was left for the patient providing contact information and requesting a return call.  The care management team will reach out to the patient again over the next 5-7 days.   Ruben Reason, PharmD Clinical Pharmacist [facility] [phone]

## 2019-07-24 ENCOUNTER — Ambulatory Visit: Payer: Self-pay | Admitting: Pharmacist

## 2019-07-24 NOTE — Progress Notes (Signed)
Cardiology Office Note    Date:  07/31/2019   ID:  Kristin, Mayo 11-13-1940, MRN [id number]  PCP:  Steele Sizer, MD  Cardiologist:  Kathlyn Sacramento, MD  Electrophysiologist:  None   Chief Complaint: Follow up  History of Present Illness:   [name] is a 79 y.o. female with history of PAF onEliquis, mitral regurgitation, TIA, vertigo,small ophthalmic artery aneurysm,HTN, HLD, insomnia, and osteoporosis who presents for follow up of her A. fib.  Prior stress test in 02/2013 showed no evidence of ischemia. She was admitted to the hospital in 01/2016 with vertigo and noted to be in new onset A. fib with RVR. She converted to sinus rhythm with diltiazem.Echo at that time showed an EF of 55 to 60%, mild concentric LVH, normal wall motion, mild mitral regurgitation, PASP normal. She was admitted to the hospital in 12/2017 with spasms involving her left hand. MRI of the brain showed no evidence of acute stroke. There was an incidental finding of a small ophthalmic artery aneurysm. She had no visual complaints. Echo showed normal LV systolic function with an EF of 60 to 65%, normal wall motion, grade 1 diastolic dysfunction, normal RV systolic function, no atrial septal defect or PFO, bubble study showed no atrial level shunting at baseline or with provocation, normal PASP, with no cardiac source of emboli. Carotid artery Doppler showed mild nonobstructive disease.  She was seen in the office on 01/09/2019 and was doing well, though did note an episode of chest pain while lying in bed several days prior to that visit which was sharp and left-sided lasting for several seconds with spontaneous resolution.  She underwent Lexiscan Myoview on 01/11/2019 which showed no significant ischemia, normal wall motion, EF estimated at 45%, GI uptake artifact, no EKG changes concerning for ischemia at peak stress or recovery.  Overall, this was a low risk scan.  Follow-up echo on 01/18/2019, to  confirm EF, showed an EF of 55 to 60%, normal LV cavity size, diastolic dysfunction, normal wall motion, normal RV systolic function, mild biatrial enlargement, tricuspid aortic valve with aortic annular calcification noted, trivial mitral regurgitation, normal size and structure aorta.  She was last seen in the office on 01/23/2019 and was doing well.   She comes in doing very well from a cardiac perspective.  No chest pain, shortness of breath, palpitations, dizziness, recently, syncope, falls, hematochezia, or melena.  She does note some minimal ankle puffiness if she has been on her feet for an extended period of time, particularly along the right ankle.  The symptoms improve with leg elevation.  Otherwise, she denies any lower extremity swelling, abdominal distention, orthopnea, PND, or early satiety.  She is tolerating all medications without issues including anticoagulation.  She has received both of her COVID-19 vaccinations.  She does not have any issues or concerns at this time.   Labs independently reviewed: 05/2019 - A1c 5.9 12/2018 - direct LDL 46, TC 98, TG 85, HDL 39, potassium 4.9, BUN 13, SCr 0.93, albumin 4.3, AST/ALT normal, HGB 14.5, PLT 403 01/2016 - TSH normal  Past Medical History:  Diagnosis Date  . History of stress test    a. 02/2013 Nl stress test.  . Hypertension   . Insomnia, unspecified   . Mitral regurgitation    a. echo 01/2016: nl LV sys fxn, mild MR, w/o pulm htn, nl atrial size  . Osteoporosis, unspecified   . Paroxysmal atrial fibrillation ([place]) 01/2016   a. diagnosed 01/2016; b.  has been on eliquis, pradaxa, and xarelto at varying times 2/2 cost of each medication-->currently on eliquis (11/2016); c. CHADS2VASc --> 3 (HTN, age x 1, female).  . Pure hypercholesterolemia   . TIA (transient ischemic attack)   . Unspecified asthma(493.90)   . Vertigo     Past Surgical History:  Procedure Laterality Date  . ABDOMINAL HYSTERECTOMY    . CATARACT EXTRACTION Right  08/31/2016  . COLONOSCOPY WITH PROPOFOL N/A 03/04/2015   Procedure: COLONOSCOPY WITH PROPOFOL;  Surgeon: Lucilla Lame, MD;  Location: [facility] ENDOSCOPY;  Service: Endoscopy;  Laterality: N/A;    Current Medications: Current Meds  Medication Sig  . amLODipine (NORVASC) 10 MG tablet Take 1 tablet (10 mg total) by mouth daily.  [name] apixaban (ELIQUIS) 5 MG TABS tablet TAKE 1 TABLET(5 MG) BY MOUTH TWICE DAILY  . diltiazem (CARDIZEM) 30 MG tablet Take 1 tablet (30 mg total) by mouth every 6 (six) hours as needed (for tachycardia/recurrent Afib.).  [name] meclizine (ANTIVERT) 25 MG tablet Take 25 mg by mouth 3 (three) times daily as needed for dizziness.  . Omega-3 Fatty Acids (FISH OIL PO) Take by mouth.  . promethazine (PHENERGAN) 12.5 MG tablet Take 1 tablet (12.5 mg total) by mouth every 8 (eight) hours as needed for nausea or vomiting.  . rosuvastatin (CRESTOR) 10 MG tablet Take 1 tablet (10 mg total) by mouth daily.  [name] Spacer/Aero-Hold Chamber Bags MISC 1 each by Does not apply route 2 (two) times daily.  . SYMBICORT 160-4.5 MCG/ACT inhaler INHALE 2 PUFFS INTO THE LUNGS TWICE DAILY  . VITAMIN D PO Take by mouth.    Allergies:   Ace inhibitors and Penicillins   Social History   Socioeconomic History  . Marital status: Single    Spouse name: Not on file  . Number of children: 2  . Years of education: Not on file  . Highest education level: Associate degree: academic program  Occupational History  . Occupation: Retired  Tobacco Use  . Smoking status: Never Smoker  . Smokeless tobacco: Never Used  . Tobacco comment: smoking cessation materials not required  Substance and Sexual Activity  . Alcohol use: No    Alcohol/week: 0.0 standard drinks  . Drug use: No  . Sexual activity: Not Currently    Partners: Male  Other Topics Concern  . Not on file  Social History Narrative   Independent at baseline. Lives at home with family   Social Determinants of Health   Financial Resource Strain:     . Difficulty of Paying Living Expenses: Not on file  Food Insecurity:   . Worried About Charity fundraiser in the Last Year: Not on file  . Ran Out of Food in the Last Year: Not on file  Transportation Needs:   . Lack of Transportation (Medical): Not on file  . Lack of Transportation (Non-Medical): Not on file  Physical Activity:   . Days of Exercise per Week: Not on file  . Minutes of Exercise per Session: Not on file  Stress: No Stress Concern Present  . Feeling of Stress : Only a little  Social Connections:   . Frequency of Communication with Friends and Family: Not on file  . Frequency of Social Gatherings with Friends and Family: Not on file  . Attends Religious Services: Not on file  . Active Member of Clubs or Organizations: Not on file  . Attends Archivist Meetings: Not on file  . Marital Status: Not on file  Family History:  The patient's family history includes Aneurysm in her mother; COPD in her father; Cancer in her brother; Healthy in her brother; Hypertension in her brother and mother; Stroke in her brother.  ROS:   Review of Systems  Constitutional: Negative for chills, diaphoresis, fever, malaise/fatigue and weight loss.  HENT: Negative for congestion.   Eyes: Negative for discharge and redness.  Respiratory: Negative for cough, sputum production, shortness of breath and wheezing.   Cardiovascular: Negative for chest pain, palpitations, orthopnea, claudication, leg swelling and PND.  Gastrointestinal: Negative for abdominal pain, blood in stool, heartburn, melena, nausea and vomiting.  Musculoskeletal: Negative for falls and myalgias.  Skin: Negative for rash.  Neurological: Negative for dizziness, tingling, tremors, sensory change, speech change, focal weakness, loss of consciousness and weakness.  Endo/Heme/Allergies: Does not bruise/bleed easily.  Psychiatric/Behavioral: Negative for substance abuse. The patient is not nervous/anxious.   All  other systems reviewed and are negative.    EKGs/Labs/Other Studies Reviewed:    Studies reviewed were summarized above. The additional studies were reviewed today:  2D Echo 12/2018: 1. The left ventricle has normal systolic function, with an ejection fraction of 55-60%. The cavity size was normal. There is mildly increased left ventricular wall thickness. Left ventricular diastolic Doppler parameters are consistent with impaired relaxation. No evidence of left ventricular regional wall motion abnormalities.  2. The right ventricle has normal systolic function. The cavity was normal. There is no increase in right ventricular wall thickness. Right ventricular systolic pressure could not be assessed.  3. Left atrial size was mildly dilated.  4. Right atrial size was mildly dilated.  5. The aortic valve is tricuspid. Mild aortic annular calcification noted.  6. The mitral valve is grossly normal.  7. The aorta is normal in size and structure.  8. The interatrial septum was not well visualized. __________  MPI 12/2018: Pharmacological myocardial perfusion imaging study with no significant ischemia Normal wall motion, EF estimated at 45% GI uptake artifact No EKG changes concerning for ischemia at peak stress or in recovery. Low risk scan   EKG:  EKG is ordered today.  The EKG ordered today demonstrates sinus bradycardia, 57 bpm, no acute ST-T changes  Recent Labs: 01/09/2019: ALT 24; BUN 13; Creatinine, Ser 0.93; Hemoglobin 14.5; Platelets 403; Potassium 4.9; Sodium 141  Recent Lipid Panel    Component Value Date/Time   CHOL 98 (L) 01/09/2019 1023   TRIG 85 01/09/2019 1023   HDL 39 (L) 01/09/2019 1023   CHOLHDL 2.5 01/09/2019 1023   CHOLHDL 3.8 01/22/2018 1154   VLDL 7 01/22/2018 1154   LDLCALC 42 01/09/2019 1023   LDLCALC 134 (H) 10/17/2017 0829   LDLDIRECT 46 01/09/2019 1023    PHYSICAL EXAM:    VS:  BP 136/72 (BP Location: Left Arm, Patient Position: Sitting, Cuff Size:  Normal)   Pulse (!) 57   Ht 5\' 3"  (1.6 m)   Wt 180 lb 4 oz (81.8 kg)   SpO2 98%   BMI 31.93 kg/m   BMI: Body mass index is 31.93 kg/m.  Physical Exam  Constitutional: She is oriented to person, place, and time. She appears well-developed and well-nourished.  HENT:  Head: Normocephalic and atraumatic.  Eyes: Right eye exhibits no discharge. Left eye exhibits no discharge.  Neck: No JVD present.  Cardiovascular: Regular rhythm, S1 normal, S2 normal and normal heart sounds. Bradycardia present. Exam reveals no distant heart sounds, no friction rub, no midsystolic click and no opening snap.  No murmur  heard. Pulses:      Posterior tibial pulses are 2+ on the right side and 2+ on the left side.  Pulmonary/Chest: Effort normal and breath sounds normal. No respiratory distress. She has no decreased breath sounds. She has no wheezes. She has no rales. She exhibits no tenderness.  Abdominal: Soft. She exhibits no distension. There is no abdominal tenderness.  Musculoskeletal:        General: No edema.     Cervical back: Normal range of motion.  Neurological: She is alert and oriented to person, place, and time.  Skin: Skin is warm and dry. No cyanosis. Nails show no clubbing.  Psychiatric: She has a normal mood and affect. Her speech is normal and behavior is normal. Judgment and thought content normal.    Wt Readings from Last 3 Encounters:  07/31/19 180 lb 4 oz (81.8 kg)  05/31/19 180 lb 6.4 oz (81.8 kg)  01/30/19 180 lb 3.2 oz (81.7 kg)     ASSESSMENT & PLAN:   1. Atypical chest pain: No further symptoms.  Recent Lexiscan Myoview low risk as outlined above.  She is on Eliquis in place of aspirin.  No further cardiac work-up at this time.  2. PAF: Maintaining sinus rhythm with mildly bradycardic heart rate with prior demonstration of appropriate chronotropic response.  She is not requiring rate control medications given underlying sinus bradycardia.  She continues to have short acting  diltiazem to be used as needed for recurrent A. fib and has not needed any recently.  Asymptomatic.  She remains on Eliquis in the setting of her CHADS2VASc of at least 6 (HTN, age x 2, TIA x 2, sex category).  No symptoms concerning for bleeding.  Check CBC and BMP.  3. HTN: Blood pressure is reasonably controlled.  Continue current dose of Norvasc.  4. HLD: LDL of 46 from 12/2018 with normal liver function at that time.  Remains on Crestor.  5. TIA: No residual symptoms.  Remains on Eliquis as outlined above given underlying PAF.  Continue Crestor as above.  6. Mitral regurgitation: No murmur noted on exam.  Echo from 12/2018 demonstrated trivial mitral regurgitation.  Follow-up periodic echo.  Disposition: F/u with Dr. Fletcher Anon or an APP in 6 months.   Medication Adjustments/Labs and Tests Ordered: Current medicines are reviewed at length with the patient today.  Concerns regarding medicines are outlined above. Medication changes, Labs and Tests ordered today are summarized above and listed in the Patient Instructions accessible in Encounters.   Signed, Christell Faith, PA-C 07/31/2019 9:09 AM     [place] [address] [place] [address] [place], [place] [address] [phone]

## 2019-07-24 NOTE — Chronic Care Management (AMB) (Signed)
  Chronic Care Management   Note  07/24/2019 Name: [name] MRN: [id number] DOB: 1940-12-06  79 y.o. year old female referred to [facility] clinical pharmacy services by Neldon Labella, RNCM for medication assistance- Eliquis. Chronic conditions include HTN, afib.  Was unable to reach patient via telephone today and have left HIPAA compliant voicemail asking patient to return my call. (unsuccessful outreach #2).  Follow up plan: A HIPPA compliant phone message was left for the patient providing contact information and requesting a return call.  The care management team will reach out to the patient again over the next 5-7 days.   Ruben Reason, PharmD Clinical Pharmacist [facility] [phone]

## 2019-07-30 ENCOUNTER — Other Ambulatory Visit: Payer: Self-pay

## 2019-07-30 MED ORDER — APIXABAN 5 MG PO TABS: ORAL_TABLET | ORAL | 6 refills | Status: DC

## 2019-07-31 ENCOUNTER — Ambulatory Visit: Payer: Self-pay | Admitting: Pharmacist

## 2019-07-31 ENCOUNTER — Encounter: Payer: Self-pay | Admitting: Physician Assistant

## 2019-07-31 ENCOUNTER — Other Ambulatory Visit: Payer: Self-pay

## 2019-07-31 ENCOUNTER — Ambulatory Visit (INDEPENDENT_AMBULATORY_CARE_PROVIDER_SITE_OTHER): Payer: Medicare Other | Admitting: Physician Assistant

## 2019-07-31 VITALS — BP 136/72 | HR 57 | Ht 63.0 in | Wt 180.2 lb

## 2019-07-31 DIAGNOSIS — G459 Transient cerebral ischemic attack, unspecified: Secondary | ICD-10-CM

## 2019-07-31 DIAGNOSIS — I340 Nonrheumatic mitral (valve) insufficiency: Secondary | ICD-10-CM | POA: Diagnosis not present

## 2019-07-31 DIAGNOSIS — I480 Paroxysmal atrial fibrillation: Secondary | ICD-10-CM

## 2019-07-31 DIAGNOSIS — E785 Hyperlipidemia, unspecified: Secondary | ICD-10-CM | POA: Diagnosis not present

## 2019-07-31 DIAGNOSIS — I10 Essential (primary) hypertension: Secondary | ICD-10-CM

## 2019-07-31 NOTE — Patient Instructions (Signed)
Medication Instructions:  1- Eliquis Samples 1 box (Lot [id number], exp 1/23) *If you need a refill on your cardiac medications before your next appointment, please call your pharmacy*  Lab Work: Your physician recommends that you have lab work today(CBC, BMET)  If you have labs (blood work) drawn today and your tests are completely normal, you will receive your results only by: [name] MyChart Message (if you have MyChart) OR . A paper copy in the mail If you have any lab test that is abnormal or we need to change your treatment, we will call you to review the results.  Testing/Procedures: None ordered   Follow-Up: At [facility], you and your health needs are our priority.  As part of our continuing mission to provide you with exceptional heart care, we have created designated Provider Care Teams.  These Care Teams include your primary Cardiologist (physician) and Advanced Practice Providers (APPs -  Physician Assistants and Nurse Practitioners) who all work together to provide you with the care you need, when you need it.  Your next appointment:   6 month(s)  The format for your next appointment:   In Person  Provider:    You may see Kathlyn Sacramento, MD or Christell Faith, PA-C.

## 2019-08-01 ENCOUNTER — Telehealth: Payer: Self-pay

## 2019-08-01 LAB — BASIC METABOLIC PANEL WITH GFR
BUN/Creatinine Ratio: 13 (ref 12–28)
BUN: 12 mg/dL (ref 8–27)
CO2: 19 mmol/L — ABNORMAL LOW (ref 20–29)
Calcium: 9.6 mg/dL (ref 8.7–10.3)
Chloride: 107 mmol/L — ABNORMAL HIGH (ref 96–106)
Creatinine, Ser: 0.9 mg/dL (ref 0.57–1.00)
GFR calc Af Amer: 72 mL/min/{1.73_m2}
GFR calc non Af Amer: 62 mL/min/{1.73_m2}
Glucose: 109 mg/dL — ABNORMAL HIGH (ref 65–99)
Potassium: 4.4 mmol/L (ref 3.5–5.2)
Sodium: 141 mmol/L (ref 134–144)

## 2019-08-01 LAB — CBC
Hematocrit: 42.9 % (ref 34.0–46.6)
Hemoglobin: 14.7 g/dL (ref 11.1–15.9)
MCH: 30.6 pg (ref 26.6–33.0)
MCHC: 34.3 g/dL (ref 31.5–35.7)
MCV: 89 fL (ref 79–97)
Platelets: 376 10*3/uL (ref 150–450)
RBC: 4.8 x10E6/uL (ref 3.77–5.28)
RDW: 12.8 % (ref 11.7–15.4)
WBC: 6.9 10*3/uL (ref 3.4–10.8)

## 2019-08-01 NOTE — Telephone Encounter (Signed)
Call to patient to review labs. She verbalized understanding and had no further questions at this time.   No further orders.   Advised pt to call for any further questions or concerns.

## 2019-08-01 NOTE — Telephone Encounter (Signed)
-----   Message from Rise Mu, PA-C sent at 08/01/2019  7:08 AM EST ----- Blood count normal. Random glucose okay. Kidney function normal. Potassium at goal.  No changes.

## 2019-08-02 NOTE — Chronic Care Management (AMB) (Signed)
  Chronic Care Management   Note  08/02/2019 Name: [name] MRN: [id number] DOB: 18-Apr-1941  79 y.o. year old female referred to [facility] clinical pharmacy services by Neldon Labella, RNCM for medication assistance- Eliquis. Chronic conditions include HTN, afib.  Was unable to reach patient via telephone today and have left HIPAA compliant voicemail asking patient to return my call. (unsuccessful outreach #3).  Follow up plan: A HIPPA compliant phone message was left for the patient providing contact information and requesting a return call.  The care management team will reach out to the patient again over the next 5-7 days. This will be the final outreach attempt.   Ruben Reason, PharmD Clinical Pharmacist [facility] [phone]

## 2019-08-03 ENCOUNTER — Ambulatory Visit: Payer: Self-pay | Admitting: Pharmacist

## 2019-08-07 ENCOUNTER — Telehealth: Payer: Self-pay | Admitting: Family Medicine

## 2019-08-07 ENCOUNTER — Ambulatory Visit: Payer: Self-pay | Admitting: Pharmacist

## 2019-08-07 ENCOUNTER — Ambulatory Visit: Payer: Self-pay

## 2019-08-07 DIAGNOSIS — E782 Mixed hyperlipidemia: Secondary | ICD-10-CM

## 2019-08-07 DIAGNOSIS — I10 Essential (primary) hypertension: Secondary | ICD-10-CM

## 2019-08-07 DIAGNOSIS — Z8673 Personal history of transient ischemic attack (TIA), and cerebral infarction without residual deficits: Secondary | ICD-10-CM

## 2019-08-07 NOTE — Telephone Encounter (Signed)
Copied from [place] [id number]. Topic: General - Other >> Aug 07, 2019 11:37 AM Rayann Heman wrote: Reason for CRM: pt called and stated that she would like a call back from julie regarding medications. Please advise

## 2019-08-07 NOTE — Chronic Care Management (AMB) (Signed)
  Chronic Care Management   Note  08/07/2019 Name: [name] MRN: [id number] DOB: 1941/02/01   Message received from [name] regarding medications. Per message, she is interested in having  Eliquis, amlodipine, diltiazem and rosuvastatin ordered as a three month supply.  Requested that message be forwarded to the [facility] Pharmacist.  Follow up plan: Will forward message to CCM Pharmacist, [name]. Will follow-up with [name] as scheduled later this month.   [facility] Care Management [phone]

## 2019-08-09 ENCOUNTER — Ambulatory Visit: Payer: Self-pay

## 2019-08-09 NOTE — Chronic Care Management (AMB) (Signed)
  Care Coordination/Appointment Request     Note  08/09/2019 Name: AYRAM BELUE MRN: [id number] DOB: [birth year]    Call received from Ms. Apgar. Required assistance with scheduling an appointment with her primary care provider. Denied acute or urgent needs.  Appointment scheduled with Dr. Ancil Boozer on 08/31/19.     Follow up plan: The Care Management team will follow up as scheduled on 08/22/19. Ms. Arnet was encouraged to call with any health related concerns if needed prior to the scheduled outreach.   [facility] Care Management [phone]

## 2019-08-15 MED ORDER — AMLODIPINE BESYLATE 10 MG PO TABS: 10.0000 mg | ORAL_TABLET | Freq: Every day | ORAL | 0 refills | Status: DC

## 2019-08-15 MED ORDER — ROSUVASTATIN CALCIUM 10 MG PO TABS: 10.0000 mg | ORAL_TABLET | Freq: Every day | ORAL | 1 refills | Status: DC

## 2019-08-15 NOTE — Chronic Care Management (AMB) (Signed)
  Chronic Care Management   Note  08/15/2019 Name: [name] MRN: [id number] DOB: [birth year]  78 y.o. year old female referred to Chronic Care Management by Dr. Ancil Boozer for clinical pharmacy medication management and [facility] care management.   Was unable to reach patient via telephone today and have left HIPAA compliant voicemail asking patient to return my call. (unsuccessful outreach #1).  Follow up plan: Telephone follow up appointment with care management team member scheduled for: within 5 days  Ruben Reason, PharmD Clinical Pharmacist [place] [phone]

## 2019-08-15 NOTE — Patient Instructions (Signed)
.  goasad

## 2019-08-15 NOTE — Chronic Care Management (AMB) (Signed)
  Chronic Care Management   Note  08/15/2019 Name: [name] MRN: [id number] DOB: 1941-01-22  79 y.o. year old female referred to Chronic Care Management by Dr. Ancil Boozer for clinical pharmacy medication management and [facility] care management.   Was unable to reach patient via telephone today and have left HIPAA compliant voicemail asking patient to return my call. (unsuccessful outreach #2).  In communication with Cordelia Pen, RNCM, patient is requesting 90 days supply of maintenance medications. I have placed these orders in the EMR for Dr. Ancil Boozer to [place].   Goals Addressed            This Visit's Progress   . Simplify pharmacy regimen (pt-stated)       Current Barriers:  . Polypharmacy; complex patient with multiple comorbidities including HTN and afib .   Pharmacist Clinical Goal(s):  [name] Over the next 60 days, patient will work with PharmD and provider towards optimized medication management  Interventions: . Comprehensive medication review performed; medication list updated in electronic medical record .   Patient Self Care Activities:  . Patient will take medications as prescribed . Patient will focus on improved adherence by utilizing 90 days supply   Initial goal documentation         Follow up plan: Telephone follow up appointment with care management team member scheduled for: within 5 days  Ruben Reason, PharmD Clinical Pharmacist [place] [phone]

## 2019-08-15 NOTE — Telephone Encounter (Signed)
Returned patient's call.   Ruben Reason, PharmD Clinical Pharmacist [facility] [phone]

## 2019-08-16 ENCOUNTER — Other Ambulatory Visit: Payer: Self-pay | Admitting: Family Medicine

## 2019-08-16 ENCOUNTER — Ambulatory Visit: Payer: Self-pay | Admitting: Pharmacist

## 2019-08-22 ENCOUNTER — Ambulatory Visit (INDEPENDENT_AMBULATORY_CARE_PROVIDER_SITE_OTHER): Payer: Medicare Other

## 2019-08-22 DIAGNOSIS — E782 Mixed hyperlipidemia: Secondary | ICD-10-CM

## 2019-08-22 DIAGNOSIS — I10 Essential (primary) hypertension: Secondary | ICD-10-CM | POA: Diagnosis not present

## 2019-08-22 NOTE — Chronic Care Management (AMB) (Signed)
Chronic Care Management   Follow Up Note   08/22/2019 Name: [name] MRN: [id number] DOB: 01/05/1941  Primary Care Provider: Steele Sizer, MD Reason for referral : Chronic Care Management  [name] is a 79 y.o. year old female who is a primary care patient of Steele Sizer, MD. She is currently engaged with the chronic care management team. A routine telephonic outreach was conducted today.  She reports receiving both doses of the Covid-19 vaccination.   Review of [name]'s status, including review of consultants reports, relevant labs and test results was conducted today. Collaboration with appropriate care team members was performed as part of the comprehensive evaluation and provision of chronic care management services.       Outpatient Encounter Medications as of 08/22/2019  Medication Sig  . amLODipine (NORVASC) 10 MG tablet Take 1 tablet (10 mg total) by mouth daily.  [name] apixaban (ELIQUIS) 5 MG TABS tablet TAKE 1 TABLET(5 MG) BY MOUTH TWICE DAILY  . diltiazem (CARDIZEM) 30 MG tablet Take 1 tablet (30 mg total) by mouth every 6 (six) hours as needed (for tachycardia/recurrent Afib.).  [name] meclizine (ANTIVERT) 25 MG tablet Take 25 mg by mouth 3 (three) times daily as needed for dizziness.  . Omega-3 Fatty Acids (FISH OIL PO) Take by mouth.  . promethazine (PHENERGAN) 12.5 MG tablet Take 1 tablet (12.5 mg total) by mouth every 8 (eight) hours as needed for nausea or vomiting.  . rosuvastatin (CRESTOR) 10 MG tablet Take 1 tablet (10 mg total) by mouth daily.  [name] Spacer/Aero-Hold Chamber Bags MISC 1 each by Does not apply route 2 (two) times daily.  . SYMBICORT 160-4.5 MCG/ACT inhaler INHALE 2 PUFFS INTO THE LUNGS TWICE DAILY  . VITAMIN D PO Take by mouth.   No facility-administered encounter medications on file as of 08/22/2019.       Goals Addressed            This Visit's Progress   . Improve Ability to Self-Manage Chronic Illnesses       Current  Barriers:  . Chronic Disease Management support and education needs related to Hypertension and Atrial Fibrillation. Patient with history of TIA.  Nurse Case Manager Clinical Goal(s):  [name] Over the next 60 days, patient will take all medications as prescribed. . Over the next 60 days, patient will complete provider appointments as scheduled . Over the next 60 days, patient will monitor her blood pressure. . Over the next 60 days, patient will comply with cardiac prudent/heart healthy diet. . Over the next 30 days, patient will inform care management team if medication assistance is needed.    Interventions:  . Reviewed medications and discussed indications for use. Patient encouraged to take all medications as prescribed. Encouraged to notify MD if unable to tolerate prescribed regimen. Reports pending update regarding receiving medications via 3 month supply. . Encouraged patient to continue monitoring her blood pressure daily and recording readings. Encouraged to notify MD if readings are outside of established parameters. Reports systolic readings in the Q000111Q and 130's. Reports diastolic readings in the  70s and 80s. . Reviewed s/sx of complications related to cardiac health. Discussed indications for seeking immediate medical attention. . Encouraged patient to participate in mild exercise/walking when tolerated. Encouraged to continue compliance with heart healthy/cardiac prudent diet. . Reviewed schedule and upcoming provider appointments. Patient encouraged to attend appointments including telephonic outreach as scheduled to prevent delays in care. Pending appointment with Dr. Ancil Boozer on 08/31/19.  [name] Discussed  plans for ongoing care management follow up. Patient encouraged to contact care management team with health related concerns as needed.   Patient Self Care Activities:  . Self administers medications . Attends provider appointments . Calls pharmacy for medication refills . Performs  ADL's independently . Performs IADL's independently   Please see past updates related to this goal by clicking on the "Past Updates" button in the selected goal           PLAN The care management team will follow up with [name] next month. She was encouraged to call with any health related concerns if needed prior the the scheduled outreach.   [facility] Care Management [phone]

## 2019-08-31 ENCOUNTER — Other Ambulatory Visit: Payer: Self-pay

## 2019-08-31 ENCOUNTER — Ambulatory Visit (INDEPENDENT_AMBULATORY_CARE_PROVIDER_SITE_OTHER): Payer: Medicare Other | Admitting: Family Medicine

## 2019-08-31 ENCOUNTER — Encounter: Payer: Self-pay | Admitting: Family Medicine

## 2019-08-31 VITALS — BP 124/72 | HR 82 | Temp 97.9°F | Resp 14 | Wt 180.0 lb

## 2019-08-31 DIAGNOSIS — J4520 Mild intermittent asthma, uncomplicated: Secondary | ICD-10-CM | POA: Diagnosis not present

## 2019-08-31 DIAGNOSIS — G4733 Obstructive sleep apnea (adult) (pediatric): Secondary | ICD-10-CM | POA: Diagnosis not present

## 2019-08-31 DIAGNOSIS — I480 Paroxysmal atrial fibrillation: Secondary | ICD-10-CM | POA: Diagnosis not present

## 2019-08-31 DIAGNOSIS — Z8673 Personal history of transient ischemic attack (TIA), and cerebral infarction without residual deficits: Secondary | ICD-10-CM | POA: Diagnosis not present

## 2019-08-31 DIAGNOSIS — R42 Dizziness and giddiness: Secondary | ICD-10-CM

## 2019-08-31 DIAGNOSIS — I729 Aneurysm of unspecified site: Secondary | ICD-10-CM

## 2019-08-31 DIAGNOSIS — R7303 Prediabetes: Secondary | ICD-10-CM | POA: Diagnosis not present

## 2019-08-31 DIAGNOSIS — I10 Essential (primary) hypertension: Secondary | ICD-10-CM | POA: Diagnosis not present

## 2019-08-31 NOTE — Progress Notes (Signed)
Name: [name]   MRN: [id number]    DOB: 07/19/1940   Date:08/31/2019       Progress Note  Subjective  Chief Complaint  Chief Complaint  Patient presents with  . Dizziness    follow up  . Cyst    abdomen    HPI   HTN:she is now on norvasc,she denies chest pain or palpitation.BP has been at goal, she has been compliant with medication   Pre-diabetes: hgb A1C was 5.9 %% Denies polyphagia, polydipsia or polyuria. She has been trying to avoid carbs.  Vertigo: she has vertigo with head movement, motion sickness, and when she has an episode she needs to go to [facility] by EMS because of severe nausea, vomiting and diarrhea01/2018. She had one recent episode this Summer but resolved by itself . She states promethazine is more helpful than Meclizine prn. She states over the past few weeks she noticed episodes not as intense, only happens when moving her head in bed and rapid head movements during the day very mild nausea, advised referral to ENT   Paroxysmal Afib: sees Dr. Fletcher Anon, symptoms started August 2017, she is onEliquisand prn Cardizem.Norecent episodes of fluttering or chest pain - back in July 2020  she had one episode , saw cardiologist and had repeat Echo, showed normal EF, mild right atrial dilation. No recent problems noticed    History of CVA/TIA and also aneursym of left ophthalmic artery , under the care of Dr. Manuella Ghazi- neurologist She is now taking Crestor and last LDL was excellent and no side effects. She has mild headache, not very intense and no changes Discussed ways to increase HDL   MR angio head without contrast from 09/08/2018: per Dr. Manuella Ghazi no need to repeat study on a regular basis   1. 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 2. Atherosclerosis without significant intracranial artery stenosis  Asthma: she states she has been doing well, only uses Symbicort about 4 times a week when she thinks about, she tends to forget  since feeling well, no cough, wheezing or SOB at this time. Explained symbicort is only valid for 60 days after she opens package, she is trying to do it more often lately   Lump on abdominal wall: she felt something this am but no pain or rash OSA: seen by Dr. Manuella Ghazi- neurologist, unable to tolerate CPAP, sleep study done 06/30/2018, she was supposed to have an oral appliance, she wants to go but needs to get a copy from sleep study   Patient Active Problem List   Diagnosis Date Noted  . Aneurysm ([place]) 02/08/2018  . History of CVA (cerebrovascular accident) without residual deficits 01/24/2018  . 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 TIA (transient ischemic attack) 01/22/2018  . Impingement syndrome of shoulder region 01/02/2018  . History of cataract surgery, right 09/01/2016  . Paroxysmal atrial fibrillation ([place]) 02/24/2016  . Vertigo 02/24/2016  . Low HDL (under 40) 02/17/2016  . Dizziness 02/15/2016  . Hx of colonic polyps   . Benign neoplasm of sigmoid colon   . Asthma, mild intermittent 02/21/2015  . Hyperglycemia 02/21/2015  . Essential hypertension 02/20/2015  . Inconclusive mammogram due to dense breasts 01/02/2015  . Dense breast 01/02/2015  . Abnormal EKG 02/23/2013    Past Surgical History:  Procedure Laterality Date  . ABDOMINAL HYSTERECTOMY    . CATARACT EXTRACTION Right 08/31/2016  . COLONOSCOPY WITH PROPOFOL N/A 03/04/2015   Procedure: COLONOSCOPY WITH PROPOFOL;  Surgeon:  Lucilla Lame, MD;  Location: [facility] ENDOSCOPY;  Service: Endoscopy;  Laterality: N/A;    Family History  Problem Relation Age of Onset  . COPD Father   . Hypertension Mother   . Aneurysm Mother   . Hypertension Brother   . Stroke Brother   . Cancer Brother        pancreatic  . Healthy Brother     Social History   Tobacco Use  . Smoking status: Never Smoker  . Smokeless tobacco: Never Used  . Tobacco comment: smoking cessation materials not required  Substance Use Topics  .  Alcohol use: No    Alcohol/week: 0.0 standard drinks     Current Outpatient Medications:  .  amLODipine (NORVASC) 10 MG tablet, Take 1 tablet (10 mg total) by mouth daily., Disp: 90 tablet, Rfl: 0 .  apixaban (ELIQUIS) 5 MG TABS tablet, TAKE 1 TABLET(5 MG) BY MOUTH TWICE DAILY, Disp: 60 tablet, Rfl: 6 .  diltiazem (CARDIZEM) 30 MG tablet, Take 1 tablet (30 mg total) by mouth every 6 (six) hours as needed (for tachycardia/recurrent Afib.)., Disp: 30 tablet, Rfl: 3 .  meclizine (ANTIVERT) 25 MG tablet, Take 25 mg by mouth 3 (three) times daily as needed for dizziness., Disp: , Rfl:  .  Omega-3 Fatty Acids (FISH OIL PO), Take by mouth., Disp: , Rfl:  .  promethazine (PHENERGAN) 12.5 MG tablet, Take 1 tablet (12.5 mg total) by mouth every 8 (eight) hours as needed for nausea or vomiting., Disp: 6 tablet, Rfl: 0 .  rosuvastatin (CRESTOR) 10 MG tablet, Take 1 tablet (10 mg total) by mouth daily., Disp: 90 tablet, Rfl: 1 .  Spacer/Aero-Hold [employer], 1 each by Does not apply route 2 (two) times daily., Disp: 1 each, Rfl: 0 .  SYMBICORT 160-4.5 MCG/ACT inhaler, INHALE 2 PUFFS INTO THE LUNGS TWICE DAILY, Disp: 10.2 g, Rfl: 0 .  VITAMIN D PO, Take by mouth., Disp: , Rfl:   Allergies  Allergen Reactions  . Ace Inhibitors Swelling    Patient states her tongue swell.  [name] Penicillins Anxiety    Has patient had a PCN reaction causing immediate rash, facial/tongue/throat swelling, SOB or lightheadedness with hypotension: No Has patient had a PCN reaction causing severe rash involving mucus membranes or skin necrosis: No Has patient had a PCN reaction that required hospitalization No Has patient had a PCN reaction occurring within the last 10 years: No If all of the above answers are "NO", then may proceed with Cephalosporin use.    I personally reviewed active problem list, medication list, allergies, family history, social history with the patient/caregiver today.   ROS  Constitutional:  Negative for fever or weight change.  Respiratory: Negative for cough and shortness of breath.   Cardiovascular: Negative for chest pain or palpitations.  Gastrointestinal: Negative for abdominal pain, no bowel changes.  Musculoskeletal: Negative for gait problem or joint swelling.  Skin: Negative for rash.  Neurological: Negative for dizziness or headache.  No other specific complaints in a complete review of systems (except as listed in HPI above).  Objective  Vitals:   08/31/19 0925  BP: 124/72  Pulse: 82  Resp: 14  Temp: 97.9 F (36.6 C)  TempSrc: Temporal  SpO2: 96%  Weight: 180 lb (81.6 kg)    Body mass index is 31.89 kg/m.  Physical Exam  Constitutional: Patient appears well-developed and well-nourished. Obese  No distress.  HEENT: head atraumatic, normocephalic, pupils equal and reactive to light, ears : wax in  the ear canal,  neck supple, throat within normal limits Cardiovascular: Normal rate, regular rhythm and normal heart sounds.  No murmur heard. No BLE edema. Pulmonary/Chest: Effort normal and breath sounds normal. No respiratory distress. Abdominal: Soft.  There is no tenderness. No masses on exam of abdominal wall, normal bowel sounds Neurological: normal cranial nerves, no nystagmus, romberg negative  Psychiatric: Patient has a normal mood and affect. behavior is normal. Judgment and thought content normal.  Recent Results (from the past 2160 hour(s))  CBC     Status: None   Collection Time: 07/31/19  9:27 AM  Result Value Ref Range   WBC 6.9 3.4 - 10.8 x10E3/uL   RBC 4.80 3.77 - 5.28 x10E6/uL   Hemoglobin 14.7 11.1 - 15.9 g/dL   Hematocrit 42.9 34.0 - 46.6 %   MCV 89 79 - 97 fL   MCH 30.6 26.6 - 33.0 pg   MCHC 34.3 31.5 - 35.7 g/dL   RDW 12.8 11.7 - 15.4 %   Platelets 376 150 - 450 [id number]  Basic metabolic panel     Status: Abnormal   Collection Time: 07/31/19  9:27 AM  Result Value Ref Range   Glucose 109 (H) 65 - 99 mg/dL   BUN 12 8 - 27  mg/dL   Creatinine, Ser 0.90 0.57 - 1.00 mg/dL   GFR calc non Af Amer 62 >59 mL/min/1.73   GFR calc Af Amer 72 >59 mL/min/1.73   BUN/Creatinine Ratio 13 12 - 28   Sodium 141 134 - 144 mmol/L   Potassium 4.4 3.5 - 5.2 mmol/L   Chloride 107 (H) 96 - 106 mmol/L   CO2 19 (L) 20 - 29 mmol/L   Calcium 9.6 8.7 - 10.3 mg/dL     PHQ2/9: Depression screen [facility] 2/9 08/31/2019 08/22/2019 06/15/2019 05/31/2019 01/30/2019  Decreased Interest 0 0 0 0 0  Down, Depressed, Hopeless 0 0 0 0 0  PHQ - 2 Score 0 0 0 0 0  Altered sleeping 0 - - 0 -  Tired, decreased energy 0 - - 0 -  Change in appetite 0 - - 0 -  Feeling bad or failure about yourself  0 - - 0 -  Trouble concentrating 0 - - 0 -  Moving slowly or fidgety/restless 0 - - 0 -  Suicidal thoughts 0 - - 0 -  PHQ-9 Score 0 - - 0 -  Difficult doing work/chores Not difficult at all - - - -  Some recent data might be hidden    phq 9 is negative   Fall Risk: Fall Risk  08/31/2019 06/15/2019 05/31/2019 01/30/2019 11/29/2018  Falls in the past year? - 0 0 0 0  Number falls in past yr: - - 0 0 0  Injury with Fall? - - 0 0 0  Risk for fall due to : - Medication side effect - - -  Risk for fall due to: Comment - - - - -  Follow up Falls evaluation completed Falls prevention discussed - Falls prevention discussed -     Assessment & Plan  1. Paroxysmal atrial fibrillation (HCC)   2. Essential hypertension  At goal   3. History of CVA (cerebrovascular accident) without residual deficits   4. Aneurysm ([place])  Last one was stable  5. History of TIA (transient ischemic attack)  No recent episodes, bp under control and on statin therapy   6. Mild intermittent asthma without complication  Stable at this time  7. Vertigo  -  Ambulatory referral to ENT  8. Pre-diabetes  Last A1C has improve   9. OSA (obstructive sleep apnea)  Had sleep study but not using CPAP , waiting for mouth piece

## 2019-09-05 ENCOUNTER — Telehealth: Payer: Self-pay

## 2019-09-05 NOTE — Telephone Encounter (Signed)
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

## 2019-09-06 NOTE — Telephone Encounter (Signed)
The past few days has dizziness is getting worse. Moving around is a little worse. Will follow up with ENT tomorrow

## 2019-09-06 NOTE — Telephone Encounter (Signed)
Will discuss with ENT at appointment tomorrow. Her plans are to go back to work when they need her.

## 2019-09-07 NOTE — Chronic Care Management (AMB) (Signed)
  Chronic Care Management   Note  09/07/2019 Name: [name] MRN: [id number] DOB: August 08, 1940  79 y.o. year old female referred to Chronic Care Management by Dr. Ancil Boozer for clinical pharmacy medication management and [facility] care management.   Was unable to reach patient via telephone today and have left HIPAA compliant voicemail asking patient to return my call. (unsuccessful outreach #3).  Follow up plan: Telephone follow up appointment with care management team member scheduled for: [facility] Felecia McCray  Ruben Reason, PharmD Clinical Pharmacist [place] [phone]

## 2019-09-17 ENCOUNTER — Telehealth: Payer: Self-pay

## 2019-09-17 ENCOUNTER — Ambulatory Visit: Payer: Self-pay

## 2019-09-17 NOTE — Chronic Care Management (AMB) (Signed)
  Chronic Care Management   Outreach Note  09/17/2019 Name: CESLEY BRACCIA MRN: [id number] DOB: 1940/09/20  Primary Care Provider: Steele Sizer, MD Reason for referral : Chronic Care Management    An unsuccessful telephone outreach was attempted today. Ms. Lemelin is currently engaged with the chronic care management team.  A HIPAA compliant voice message was left requesting a return call.   Follow Up Plan: The care management team will reach out to Ms. Mikels again within the next week.   [facility] Care Management [phone]

## 2019-09-17 NOTE — Patient Instructions (Signed)
Thank you for allowing the Chronic Care Management team to participate in your care.  Goals Addressed            This Visit's Progress   . Improve Ability to Self-Manage Chronic Illnesses       Current Barriers:  . Chronic Disease Management support and education needs related to Hypertension and Atrial Fibrillation. Patient with history of TIA.  Nurse Case Manager Clinical Goal(s):  [name] Over the next 60 days, patient will take all medications as prescribed. . Over the next 60 days, patient will complete provider appointments as scheduled . Over the next 60 days, patient will monitor her blood pressure. . Over the next 60 days, patient will comply with cardiac prudent/heart healthy diet. . Over the next 30 days, patient will inform care management team if medication assistance is needed.    Interventions:  . Reviewed medications and discussed indications for use. Patient encouraged to take all medications as prescribed. Encouraged to notify MD if unable to tolerate prescribed regimen. Reports pending update regarding receiving medications via 3 month supply. . Encouraged patient to continue monitoring her blood pressure daily and recording readings. Encouraged to notify MD if readings are outside of established parameters. Reports systolic readings in the Q000111Q and 130's. Reports diastolic readings in the  70s and 80s. . Reviewed s/sx of complications related to cardiac health. Discussed indications for seeking immediate medical attention. . Encouraged patient to participate in mild exercise/walking when tolerated. Encouraged to continue compliance with heart healthy/cardiac prudent diet. . Reviewed schedule and upcoming provider appointments. Patient encouraged to attend appointments including telephonic outreach as scheduled to prevent delays in care. Pending appointment with Dr. Ancil Boozer on 08/31/19.  [name] Discussed plans for ongoing care management follow up. Patient encouraged to contact care  management team with health related concerns as needed.   Patient Self Care Activities:  . Self administers medications . Attends provider appointments . Calls pharmacy for medication refills . Performs ADL's independently . Performs IADL's independently   Please see past updates related to this goal by clicking on the "Past Updates" button in the selected goal         Ms. Wessner verbalized understanding of the instructions provided during the telephonic outreach today. She declined need for a mailed/printed copy of the instructions.   The care management team will follow up with Ms. Marron next month. She was encouraged to call with any health related concerns if needed prior the the scheduled outreach.   [facility] Care Management [phone]

## 2019-09-19 ENCOUNTER — Ambulatory Visit (INDEPENDENT_AMBULATORY_CARE_PROVIDER_SITE_OTHER): Payer: Medicare Other

## 2019-09-19 DIAGNOSIS — I10 Essential (primary) hypertension: Secondary | ICD-10-CM | POA: Diagnosis not present

## 2019-09-19 NOTE — Chronic Care Management (AMB) (Signed)
Chronic Care Management   Follow Up Note   09/19/2019 Name: [name] MRN: [id number] DOB: 07/03/1940  Primary Care Provider: Steele Sizer, MD Reason for referral : Chronic Care Management   [name] is a 79 y.o. year old female who is a primary care patient of Kristin Sizer, MD. She is currently engaged with the chronic care management team. A routine telephonic outreach was conducted today.  Review of [name]'s status, including review of consultants reports, relevant labs and test results was conducted today. Collaboration with appropriate care team members was performed as part of the comprehensive evaluation and provision of chronic care management services.     Outpatient Encounter Medications as of 09/19/2019  Medication Sig  . amLODipine (NORVASC) 10 MG tablet Take 1 tablet (10 mg total) by mouth daily.  [name] apixaban (ELIQUIS) 5 MG TABS tablet TAKE 1 TABLET(5 MG) BY MOUTH TWICE DAILY  . diltiazem (CARDIZEM) 30 MG tablet Take 1 tablet (30 mg total) by mouth every 6 (six) hours as needed (for tachycardia/recurrent Afib.).  [name] meclizine (ANTIVERT) 25 MG tablet Take 25 mg by mouth 3 (three) times daily as needed for dizziness.  . Omega-3 Fatty Acids (FISH OIL PO) Take by mouth.  . promethazine (PHENERGAN) 12.5 MG tablet Take 1 tablet (12.5 mg total) by mouth every 8 (eight) hours as needed for nausea or vomiting.  . rosuvastatin (CRESTOR) 10 MG tablet Take 1 tablet (10 mg total) by mouth daily.  [name] Spacer/Aero-Hold Chamber Bags MISC 1 each by Does not apply route 2 (two) times daily.  . SYMBICORT 160-4.5 MCG/ACT inhaler INHALE 2 PUFFS INTO THE LUNGS TWICE DAILY  . VITAMIN D PO Take by mouth.   No facility-administered encounter medications on file as of 09/19/2019.     Objective:  BP Readings from Last 3 Encounters:  08/31/19 124/72  07/31/19 136/72  05/31/19 140/82    Goals Addressed            This Visit's Progress   . Improve Ability to  Self-Manage Chronic Illnesses       Current Barriers:  . Chronic Disease Management support and education needs related to Hypertension and Atrial Fibrillation. Patient with history of TIA.  Case Manager Clinical Goal(s):  [name] Over the next 90 days, patient will continue taking medications as prescribed. . Over the next 90 days, patient will complete provider appointments as scheduled . Over the next 90 days, patient will monitor her blood pressure and record readings. . Over the next 90 days, patient will continue adherence with cardiac prudent/heart healthy diet.    Interventions:  . Reviewed medications and discussed indications for use. Patient encouraged to take all medications as prescribed. Encouraged to notify MD if unable to tolerate prescribed regimen. . Encouraged patient to continue monitoring her blood pressure daily and recording readings. Encouraged to notify MD if readings are outside of established parameters. Reports reading was slightly elevated at start of her provider appointment yesterday with systolic reading of XX123456. Reports likely due to ambulating quickly prior to BP being monitored. Reports monitoring consistently at home. Reports systolic range of low 123XX123 over the past few weeks. Diastolic readings remain in the 70's and 80's.  . Reviewed s/sx of complications related to cardiac health. Discussed indications for seeking immediate medical attention. . Encouraged patient to participate in mild exercise/walking when tolerated. Encouraged to continue compliance with heart healthy/cardiac prudent diet. . Reviewed schedule and upcoming provider appointments. Patient encouraged to attend appointments including telephonic  outreach as scheduled to prevent delays in care. Attended evaluation with ENT as scheduled on yesterday. Pending ENT follow-up on 09/25/19. [name] Discussed plans for ongoing care management follow up. Patient encouraged to contact care management team with health  related concerns as needed.   Patient Self Care Activities:  . Self administers medications . Attends provider appointments . Calls pharmacy for medication refills . Performs ADL's independently . Performs IADL's independently   Please see past updates related to this goal by clicking on the "Past Updates" button in the selected goal          PLAN [name] has a pending follow-up with ENT next week. The care management team will follow-up to discuss updates on 09/26/19.   [facility] [phone]

## 2019-09-19 NOTE — Patient Instructions (Addendum)
Thank you for allowing the Chronic Care Management team to participate in your care.  Goals Addressed            This Visit's Progress   . Improve Ability to Self-Manage Chronic Illnesses       Current Barriers:  . Chronic Disease Management support and education needs related to Hypertension and Atrial Fibrillation. Patient with history of TIA.  Case Manager Clinical Goal(s):  [name] Over the next 90 days, patient will continue taking medications as prescribed. . Over the next 90 days, patient will complete provider appointments as scheduled . Over the next 90 days, patient will monitor her blood pressure and record readings. . Over the next 90 days, patient will continue adherence with cardiac prudent/heart healthy diet.    Interventions:  . Reviewed medications and discussed indications for use. Patient encouraged to take all medications as prescribed. Encouraged to notify MD if unable to tolerate prescribed regimen. . Encouraged patient to continue monitoring her blood pressure daily and recording readings. Encouraged to notify MD if readings are outside of established parameters. Reports reading was slightly elevated at start of her provider appointment yesterday with systolic reading of XX123456. Reports likely due to ambulating quickly prior to BP being monitored. Reports monitoring consistently at home. Reports systolic range of low 123XX123 over the past few weeks. Diastolic readings remain in the 70's and 80's.  . Reviewed s/sx of complications related to cardiac health. Discussed indications for seeking immediate medical attention. . Encouraged patient to participate in mild exercise/walking when tolerated. Encouraged to continue compliance with heart healthy/cardiac prudent diet. . Reviewed schedule and upcoming provider appointments. Patient encouraged to attend appointments including telephonic outreach as scheduled to prevent delays in care. Attended evaluation with ENT as scheduled on  yesterday. Pending ENT follow-up on 09/25/19. [name] Discussed plans for ongoing care management follow up. Patient encouraged to contact care management team with health related concerns as needed.   Patient Self Care Activities:  . Self administers medications . Attends provider appointments . Calls pharmacy for medication refills . Performs ADL's independently . Performs IADL's independently   Please see past updates related to this goal by clicking on the "Past Updates" button in the selected goal         Ms. Fuelling verbalized understanding of the instructions provided during the telephonic outreach today. Declined need for printed/mailed instructions.   Ms. Sax has a pending follow-up with ENT next week. The care management team will follow-up to discuss updates on 09/26/19.   [facility] [phone]

## 2019-09-26 ENCOUNTER — Telehealth: Payer: Self-pay

## 2019-09-26 ENCOUNTER — Ambulatory Visit: Payer: Self-pay

## 2019-09-26 NOTE — Chronic Care Management (AMB) (Signed)
  Chronic Care Management   Outreach Note  09/26/2019 Name: [name] MRN: [id number] DOB: 07-Nov-1940  Primary Care Provider: Steele Sizer, MD Reason for referral : Chronic Care Management    An unsuccessful telephone outreach was attempted today.[name] is currently engaged with the chronic care management team. Another routine outreach was attempted today.    Follow Up Plan: The care management team will reach out to [name] later this month.    [facility] [phone]

## 2019-10-05 ENCOUNTER — Telehealth: Payer: Self-pay

## 2019-10-05 ENCOUNTER — Ambulatory Visit: Payer: Self-pay

## 2019-10-05 NOTE — Chronic Care Management (AMB) (Signed)
  Chronic Care Management   Outreach Note  10/05/2019 Name: [name] MRN: [id number] DOB: [birth year]  Primary Care Provider: Steele Sizer, MD Reason for referral : Chronic Care Management   An unsuccessful telephone outreach was attempted today. [name] is currently engaged with the chronic care management team.  A HIPAA compliant voice message was left requesting a return call.   Follow Up Plan: The care management team will reach out to [name] again in two weeks.    [facility] [phone]

## 2019-10-16 DIAGNOSIS — H903 Sensorineural hearing loss, bilateral: Secondary | ICD-10-CM | POA: Diagnosis not present

## 2019-10-16 DIAGNOSIS — R42 Dizziness and giddiness: Secondary | ICD-10-CM | POA: Diagnosis not present

## 2019-10-18 DIAGNOSIS — R42 Dizziness and giddiness: Secondary | ICD-10-CM | POA: Diagnosis not present

## 2019-10-19 ENCOUNTER — Ambulatory Visit (INDEPENDENT_AMBULATORY_CARE_PROVIDER_SITE_OTHER): Payer: Medicare Other

## 2019-10-19 DIAGNOSIS — I10 Essential (primary) hypertension: Secondary | ICD-10-CM | POA: Diagnosis not present

## 2019-10-19 DIAGNOSIS — J4520 Mild intermittent asthma, uncomplicated: Secondary | ICD-10-CM | POA: Diagnosis not present

## 2019-10-19 DIAGNOSIS — R42 Dizziness and giddiness: Secondary | ICD-10-CM

## 2019-10-24 NOTE — Chronic Care Management (AMB) (Signed)
Chronic Care Management   Follow Up Note    Name: [name] MRN: [id number] DOB: 1941/01/01  Primary Care Provider: Steele Sizer, MD Reason for referral : Chronic Care Management   [name] is a 79 y.o. year old female who is a primary care patient of Steele Sizer, MD. She is currently engaged with the chronic care management team. A routine telephonic outreach was conducted today.  Review of [name]'s status, including review of consultants reports, relevant labs and test results was conducted today. Collaboration with appropriate care team members was performed as part of the comprehensive evaluation and provision of chronic care management services.    Outpatient Encounter Medications as of 10/19/2019  Medication Sig  . amLODipine (NORVASC) 10 MG tablet Take 1 tablet (10 mg total) by mouth daily.  [name] apixaban (ELIQUIS) 5 MG TABS tablet TAKE 1 TABLET(5 MG) BY MOUTH TWICE DAILY  . diltiazem (CARDIZEM) 30 MG tablet Take 1 tablet (30 mg total) by mouth every 6 (six) hours as needed (for tachycardia/recurrent Afib.).  [name] meclizine (ANTIVERT) 25 MG tablet Take 25 mg by mouth 3 (three) times daily as needed for dizziness.  . Omega-3 Fatty Acids (FISH OIL PO) Take by mouth.  . promethazine (PHENERGAN) 12.5 MG tablet Take 1 tablet (12.5 mg total) by mouth every 8 (eight) hours as needed for nausea or vomiting.  . rosuvastatin (CRESTOR) 10 MG tablet Take 1 tablet (10 mg total) by mouth daily.  [name] Spacer/Aero-Hold Chamber Bags MISC 1 each by Does not apply route 2 (two) times daily.  . SYMBICORT 160-4.5 MCG/ACT inhaler INHALE 2 PUFFS INTO THE LUNGS TWICE DAILY  . VITAMIN D PO Take by mouth.   No facility-administered encounter medications on file as of 10/19/2019.     Objective:  BP Readings from Last 3 Encounters:  08/31/19 124/72  07/31/19 136/72  05/31/19 140/82     Goals Addressed            This Visit's Progress   . Improve Ability to Self-Manage  Chronic Illnesses       Current Barriers:  . Chronic Disease Management support and education needs related to Hypertension and Atrial Fibrillation. Patient with history of TIA.  Case Manager Clinical Goal(s):  [name] Over the next 90 days, patient will continue taking medications as prescribed. . Over the next 90 days, patient will complete provider appointments as scheduled . Over the next 90 days, patient will monitor her blood pressure and record readings. . Over the next 90 days, patient will continue adherence with cardiac prudent/heart healthy diet.    Interventions:  . Reviewed medications and discussed indications for use. Encouraged to take all medications as prescribed. Encouraged to notify MD if unable to tolerate prescribed regimen. Encouraged to update care management team with concerns regarding prescription costs. . Encouraged to continue monitoring her blood pressure daily and recording readings. Encouraged to notify MD if readings are outside of established parameters. Continues to monitor as recommended. Reports systolic range over the past few weeks in the low 130's. Reports diastolic range in the XX123456. . Discussed current activity tolerance and compliance with recommended heart healthy diet. Reports good nutritional intake. Denies changes in activity tolerance. Reports using inhalers as needed. Denies shortness of breath when ambulating. Reports walking outdoors regularly when weather permits. . Reviewed schedule and upcoming provider appointments. Patient encouraged to attend appointments including telephonic outreach as scheduled to prevent delays in care. Attended second ENT outreach as scheduled. Reports no recommendations  for further follow-up unless she develops new or worsening vertigo related symptoms. . Discussed plans for ongoing care management follow up. Patient encouraged to contact care management team with health related concerns as needed.   Patient Self Care  Activities:  . Self administers medications . Attends provider appointments . Calls pharmacy for medication refills . Performs ADL's independently . Performs IADL's independently   Please see past updates related to this goal by clicking on the "Past Updates" button in the selected goal      . COMPLETED: Simplify pharmacy regimen (pt-stated)       Current Barriers:  . Polypharmacy; complex patient with multiple comorbidities including HTN and afib .   Pharmacist Clinical Goal(s):  [name] Over the next 60 days, patient will work with PharmD and provider towards optimized medication management  Interventions: . Reviewed medications. Reports receiving 90 day supply of medications as requested.  Patient Self Care Activities:  . Patient will take medications as prescribed . Patient will focus on improved adherence by utilizing 90 days supply   Please see past updates related to this goal by clicking on the "Past Updates" button in the selected goal         PLAN The care management team will follow up with Ms. Weeda next month.    [facility] [phone]

## 2019-10-24 NOTE — Patient Instructions (Signed)
Thank you for allowing the Chronic Care Management team to participate in your care.  Goals Addressed            This Visit's Progress   . Improve Ability to Self-Manage Chronic Illnesses       Current Barriers:  . Chronic Disease Management support and education needs related to Hypertension and Atrial Fibrillation. Patient with history of TIA.  Case Manager Clinical Goal(s):  [name] Over the next 90 days, patient will continue taking medications as prescribed. . Over the next 90 days, patient will complete provider appointments as scheduled . Over the next 90 days, patient will monitor her blood pressure and record readings. . Over the next 90 days, patient will continue adherence with cardiac prudent/heart healthy diet.    Interventions:  . Reviewed medications and discussed indications for use. Encouraged to take all medications as prescribed. Encouraged to notify MD if unable to tolerate prescribed regimen. Encouraged to update care management team with concerns regarding prescription costs. . Encouraged to continue monitoring her blood pressure daily and recording readings. Encouraged to notify MD if readings are outside of established parameters. Continues to monitor as recommended. Reports systolic range over the past few weeks in the low 130's. Reports diastolic range in the XX123456. . Discussed current activity tolerance and compliance with recommended heart healthy diet. Reports good nutritional intake. Denies changes in activity tolerance. Reports using inhalers as needed. Denies shortness of breath when ambulating. Reports walking outdoors regularly when weather permits. . Reviewed schedule and upcoming provider appointments. Patient encouraged to attend appointments including telephonic outreach as scheduled to prevent delays in care. Attended second ENT outreach as scheduled. Reports no recommendations for further follow-up unless she develops new or worsening vertigo related  symptoms. . Discussed plans for ongoing care management follow up. Patient encouraged to contact care management team with health related concerns as needed.   Patient Self Care Activities:  . Self administers medications . Attends provider appointments . Calls pharmacy for medication refills . Performs ADL's independently . Performs IADL's independently   Please see past updates related to this goal by clicking on the "Past Updates" button in the selected goal      . COMPLETED: Simplify pharmacy regimen (pt-stated)       Current Barriers:  . Polypharmacy; complex patient with multiple comorbidities including HTN and afib .   Pharmacist Clinical Goal(s):  [name] Over the next 60 days, patient will work with PharmD and provider towards optimized medication management  Interventions: . Reviewed medications. Reports receiving 90 day supply of medications as requested.  Patient Self Care Activities:  . Patient will take medications as prescribed . Patient will focus on improved adherence by utilizing 90 days supply   Please see past updates related to this goal by clicking on the "Past Updates" button in the selected goal         Ms. Rapaport verbalized understanding of the instructions provided during the telephonic outreach today. Declined need for printed/mailed instructions.    The care management team will follow up with Ms. Blakenship next month.    [facility] [phone]

## 2019-11-12 ENCOUNTER — Ambulatory Visit (INDEPENDENT_AMBULATORY_CARE_PROVIDER_SITE_OTHER): Payer: Medicare Other

## 2019-11-12 DIAGNOSIS — I10 Essential (primary) hypertension: Secondary | ICD-10-CM | POA: Diagnosis not present

## 2019-11-12 DIAGNOSIS — R42 Dizziness and giddiness: Secondary | ICD-10-CM

## 2019-11-12 NOTE — Patient Instructions (Signed)
Thank you for allowing the Chronic Care Management team to participate in your care.  Goals Addressed            This Visit's Progress   . Improve Ability to Self-Manage Chronic Illnesses       Current Barriers:  . Chronic Disease Management support and education needs related to Hypertension and Atrial Fibrillation. Patient with history of TIA.  Case Manager Clinical Goal(s):  [name] Over the next 90 days, patient will continue taking medications as prescribed. . Over the next 90 days, patient will complete provider appointments as scheduled . Over the next 90 days, patient will monitor her blood pressure and record readings. . Over the next 90 days, patient will continue adherence with cardiac prudent/heart healthy diet.    Interventions:  . Discussed medications. Currently receiving 90 day supply. Pending refills. Will keep team updated if additional assistance is needed. . Reviewed blood pressure readings. Reports systolic readings remain in the 130's. Unable to recall diastolic readings but recalls them being within range.  . Reviewed safety measures and discussed current activity tolerance. Continues to experience dizziness when lying down, reaching or bending over. She accurately recalls fall prevention measures and reports standing and changing positions slowly. Reports ambulating well. Denies falls. . Reviewed schedule and upcoming provider appointments.  Requested to reschedule pending PCP appointment in June. Prefers to be evaluated earlier to discuss unresolved vertigo related symptoms. Reports completing all scheduled ENT evaluations as recommended. PCP appointment rescheduled for 11/23/19.    Patient Self Care Activities:  . Self administers medications . Attends provider appointments . Calls pharmacy for medication refills . Performs ADL's independently . Performs IADL's independently   Please see past updates related to this goal by clicking on the "Past Updates" button  in the selected goal         Ms. Streiff verbalized understanding of the instructions provided during the telephonic outreach today. Did not indicate need for a printed/mailed copy of the instructions.   The care management team will follow-up with Ms. Schwantz within the next two to three weeks.   [facility] [phone]

## 2019-11-12 NOTE — Chronic Care Management (AMB) (Signed)
Chronic Care Management   Follow Up Note   11/12/2019 Name: [name] MRN: [id number] DOB: [birth year]  Primary Care Provider: Steele Sizer, MD Reason for referral : Chronic Care Management   [name] is a 79 y.o. year old female who is a primary care patient of Steele Sizer, MD. She is currently engaged with the chronic care management team. A routine telephonic outreach was conducted today.  Review of [name]'s status, including review of consultants reports, relevant labs and test results was conducted today. Collaboration with appropriate care team members was performed as part of the comprehensive evaluation and provision of chronic care management services.    SDOH (Social Determinants of Health) assessments performed: No     Outpatient Encounter Medications as of 11/12/2019  Medication Sig  . amLODipine (NORVASC) 10 MG tablet Take 1 tablet (10 mg total) by mouth daily.  [name] apixaban (ELIQUIS) 5 MG TABS tablet TAKE 1 TABLET(5 MG) BY MOUTH TWICE DAILY  . diltiazem (CARDIZEM) 30 MG tablet Take 1 tablet (30 mg total) by mouth every 6 (six) hours as needed (for tachycardia/recurrent Afib.).  [name] meclizine (ANTIVERT) 25 MG tablet Take 25 mg by mouth 3 (three) times daily as needed for dizziness.  . Omega-3 Fatty Acids (FISH OIL PO) Take by mouth.  . promethazine (PHENERGAN) 12.5 MG tablet Take 1 tablet (12.5 mg total) by mouth every 8 (eight) hours as needed for nausea or vomiting.  . rosuvastatin (CRESTOR) 10 MG tablet Take 1 tablet (10 mg total) by mouth daily.  [name] Spacer/Aero-Hold Chamber Bags MISC 1 each by Does not apply route 2 (two) times daily.  . SYMBICORT 160-4.5 MCG/ACT inhaler INHALE 2 PUFFS INTO THE LUNGS TWICE DAILY  . VITAMIN D PO Take by mouth.   No facility-administered encounter medications on file as of 11/12/2019.     Goals Addressed            This Visit's Progress   . Improve Ability to Self-Manage Chronic Illnesses       Current  Barriers:  . Chronic Disease Management support and education needs related to Hypertension and Atrial Fibrillation. Patient with history of TIA.  Case Manager Clinical Goal(s):  [name] Over the next 90 days, patient will continue taking medications as prescribed. . Over the next 90 days, patient will complete provider appointments as scheduled . Over the next 90 days, patient will monitor her blood pressure and record readings. . Over the next 90 days, patient will continue adherence with cardiac prudent/heart healthy diet.    Interventions:  . Discussed medications. Currently receiving 90 day supply. Pending refills. Will keep team updated if additional assistance is needed. . Reviewed blood pressure readings. Reports systolic readings remain in the 130's. Unable to recall diastolic readings but recalls them being within range.  . Reviewed safety measures and discussed current activity tolerance. Continues to experience dizziness when lying down, reaching or bending over. She accurately recalls fall prevention measures and reports standing and changing positions slowly. Reports ambulating well. Denies falls. . Reviewed schedule and upcoming provider appointments.  Requested to reschedule pending PCP appointment in June. Prefers to be evaluated earlier to discuss unresolved vertigo related symptoms. Reports completing all scheduled ENT evaluations as recommended. PCP appointment rescheduled for 11/23/19.    Patient Self Care Activities:  . Self administers medications . Attends provider appointments . Calls pharmacy for medication refills . Performs ADL's independently . Performs IADL's independently   Please see past updates related to this goal  by clicking on the "Past Updates" button in the selected goal          PLAN The care management team will follow-up with [name] within the next two to three weeks.   [facility]  [facility] [phone]

## 2019-11-16 ENCOUNTER — Other Ambulatory Visit: Payer: Self-pay | Admitting: Family Medicine

## 2019-11-16 DIAGNOSIS — I10 Essential (primary) hypertension: Secondary | ICD-10-CM

## 2019-11-16 NOTE — Telephone Encounter (Signed)
Requested Prescriptions  Pending Prescriptions Disp Refills  . amLODipine (NORVASC) 10 MG tablet [Pharmacy Med Name: AMLODIPINE BESYLATE 10MG  TABLETS] 90 tablet 0    Sig: TAKE 1 TABLET(10 MG) BY MOUTH DAILY     Cardiovascular:  Calcium Channel Blockers Passed - 11/16/2019  2:29 PM      Passed - Last BP in normal range    BP Readings from Last 1 Encounters:  08/31/19 124/72         Passed - Valid encounter within last 6 months    Recent Outpatient Visits          2 months ago Paroxysmal atrial fibrillation [facility])   [facility] Steele Sizer, MD   5 months ago Aneurysm [facility])   [facility] Steele Sizer, MD   11 months ago Essential hypertension   [facility] Steele Sizer, MD   1 year ago Aneurysm [facility])   [facility] Steele Sizer, MD   1 year ago Sore throat   [place], NP      Future Appointments            In 1 week Steele Sizer, MD [facility], [place]   In 2 months  [facility], [place]   In 2 months Fletcher Anon, Mertie Clause, MD [facility], [place]

## 2019-11-23 ENCOUNTER — Encounter: Payer: Self-pay | Admitting: Family Medicine

## 2019-11-23 ENCOUNTER — Ambulatory Visit (INDEPENDENT_AMBULATORY_CARE_PROVIDER_SITE_OTHER): Payer: Medicare Other | Admitting: Family Medicine

## 2019-11-23 ENCOUNTER — Other Ambulatory Visit: Payer: Self-pay

## 2019-11-23 VITALS — BP 124/66 | HR 80 | Temp 96.8°F | Resp 16 | Ht 63.0 in | Wt 179.5 lb

## 2019-11-23 DIAGNOSIS — G4700 Insomnia, unspecified: Secondary | ICD-10-CM

## 2019-11-23 DIAGNOSIS — R42 Dizziness and giddiness: Secondary | ICD-10-CM

## 2019-11-23 DIAGNOSIS — I10 Essential (primary) hypertension: Secondary | ICD-10-CM

## 2019-11-23 MED ORDER — TRAZODONE HCL 50 MG PO TABS: 25.0000 mg | ORAL_TABLET | Freq: Every evening | ORAL | 0 refills | Status: DC | PRN

## 2019-11-23 NOTE — Progress Notes (Signed)
Name: [name]   MRN: [id number]    DOB: 10-29-1940   Date:11/23/2019       Progress Note  Subjective  Chief Complaint  Chief Complaint  Patient presents with  . Hypertension  . Asthma  . Hyperglycemia  . Dizziness    She continues to have some dizziness at night when she lays down. She has concerns that it maybe her BP.  [name] Insomnia    Waking up at 1 am in the morning.    HPI  Vertigo: she has vertigo with head movement, motion sickness, and when she has an episode she needs to go to [facility] by EMS because of severe nausea, vomiting and diarrhea01/2018.She had one recent Summer of 2020 but resolved by itself . She states promethazine is more helpful than Meclizine prn. She came in in March and had noticed episodes not as intense, but having it more often , only happens when moving her head in bed and rapid head movements during the day very mild nausea, she described episodes as spinning, seen by ENT since last visit with me, had Epley Maneuver and had mild improvement of symptoms, however still having episodes at least once per night. Advised her to go back.   Dizziness: bp has been controlled but in the 120's range at home, about twice a week she feels light headed when she stands up, she stays still and symptoms resolves within seconds  Insomnia: she states a couple of times a week she has difficulty falling asleep. She states mind is busy at night and cannot wind down at times. Discussed love and kindness meditation and to take Trazodone prn   Patient Active Problem List   Diagnosis Date Noted  . Aneurysm ([place]) 02/08/2018  . History of CVA (cerebrovascular accident) without residual deficits 01/24/2018  . 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 TIA (transient ischemic attack) 01/22/2018  . Impingement syndrome of shoulder region 01/02/2018  . History of cataract surgery, right 09/01/2016  . Paroxysmal atrial fibrillation ([place]) 02/24/2016  . Vertigo 02/24/2016  . Low  HDL (under 40) 02/17/2016  . Dizziness 02/15/2016  . Hx of colonic polyps   . Benign neoplasm of sigmoid colon   . Asthma, mild intermittent 02/21/2015  . Hyperglycemia 02/21/2015  . Essential hypertension 02/20/2015  . Inconclusive mammogram due to dense breasts 01/02/2015  . Dense breast 01/02/2015  . Abnormal EKG 02/23/2013    Past Surgical History:  Procedure Laterality Date  . ABDOMINAL HYSTERECTOMY    . CATARACT EXTRACTION Right 08/31/2016  . COLONOSCOPY WITH PROPOFOL N/A 03/04/2015   Procedure: COLONOSCOPY WITH PROPOFOL;  Surgeon: Lucilla Lame, MD;  Location: [facility] ENDOSCOPY;  Service: Endoscopy;  Laterality: N/A;    Family History  Problem Relation Age of Onset  . COPD Father   . Hypertension Mother   . Aneurysm Mother   . Hypertension Brother   . Stroke Brother   . Cancer Brother        pancreatic  . Healthy Brother     Social History   Tobacco Use  . Smoking status: Never Smoker  . Smokeless tobacco: Never Used  . Tobacco comment: smoking cessation materials not required  Substance Use Topics  . Alcohol use: No    Alcohol/week: 0.0 standard drinks     Current Outpatient Medications:  .  amLODipine (NORVASC) 10 MG tablet, TAKE 1 TABLET(10 MG) BY MOUTH DAILY, Disp: 90 tablet, Rfl: 0 .  apixaban (ELIQUIS) 5 MG TABS tablet, TAKE 1 TABLET(5  MG) BY MOUTH TWICE DAILY, Disp: 60 tablet, Rfl: 6 .  diltiazem (CARDIZEM) 30 MG tablet, Take 1 tablet (30 mg total) by mouth every 6 (six) hours as needed (for tachycardia/recurrent Afib.)., Disp: 30 tablet, Rfl: 3 .  meclizine (ANTIVERT) 25 MG tablet, Take 25 mg by mouth 3 (three) times daily as needed for dizziness., Disp: , Rfl:  .  Omega-3 Fatty Acids (FISH OIL PO), Take by mouth., Disp: , Rfl:  .  promethazine (PHENERGAN) 12.5 MG tablet, Take 1 tablet (12.5 mg total) by mouth every 8 (eight) hours as needed for nausea or vomiting., Disp: 6 tablet, Rfl: 0 .  rosuvastatin (CRESTOR) 10 MG tablet, Take 1 tablet (10 mg total) by  mouth daily., Disp: 90 tablet, Rfl: 1 .  Spacer/Aero-Hold [employer], 1 each by Does not apply route 2 (two) times daily., Disp: 1 each, Rfl: 0 .  SYMBICORT 160-4.5 MCG/ACT inhaler, INHALE 2 PUFFS INTO THE LUNGS TWICE DAILY, Disp: 10.2 g, Rfl: 0 .  VITAMIN D PO, Take by mouth., Disp: , Rfl:   Allergies  Allergen Reactions  . Ace Inhibitors Swelling    Patient states her tongue swell.  [name] Penicillins Anxiety    Has patient had a PCN reaction causing immediate rash, facial/tongue/throat swelling, SOB or lightheadedness with hypotension: No Has patient had a PCN reaction causing severe rash involving mucus membranes or skin necrosis: No Has patient had a PCN reaction that required hospitalization No Has patient had a PCN reaction occurring within the last 10 years: No If all of the above answers are "NO", then may proceed with Cephalosporin use.    I personally reviewed active problem list, medication list, allergies, family history, social history, health maintenance with the patient/caregiver today.   ROS  Constitutional: Negative for fever or weight change.  Respiratory: Negative for cough and shortness of breath.   Cardiovascular: Negative for chest pain or palpitations.  Gastrointestinal: Negative for abdominal pain, no bowel changes.  Musculoskeletal: Negative for gait problem or joint swelling.  Skin: Negative for rash.  Neurological: Positive for intermittent dizziness but  headache.  No other specific complaints in a complete review of systems (except as listed in HPI above).   Objective  Vitals:   11/23/19 0828  BP: 124/66  Pulse: 80  Resp: 16  Temp: (!) 96.8 F (36 C)  TempSrc: Temporal  SpO2: 95%  Weight: 179 lb 8 oz (81.4 kg)  Height: 5\' 3"  (1.6 m)    Body mass index is 31.8 kg/m.  Physical Exam  Constitutional: Patient appears well-developed and well-nourished. Obese  No distress.  HEENT: head atraumatic, normocephalic, pupils equal and reactive  to light, ears : normal TM  neck supple, throat within normal limits Cardiovascular: Normal rate, regular rhythm and normal heart sounds.  No murmur heard. No BLE edema. Pulmonary/Chest: Effort normal and breath sounds normal. No respiratory distress. Abdominal: Soft.  There is no tenderness. Neurological: normal cranial nerves, normal balance, romberg negative  Psychiatric: Patient has a normal mood and affect. behavior is normal. Judgment and thought content normal.  PHQ2/9: Depression screen [facility] 2/9 11/23/2019 08/31/2019 08/22/2019 06/15/2019 05/31/2019  Decreased Interest 0 0 0 0 0  Down, Depressed, Hopeless 0 0 0 0 0  PHQ - 2 Score 0 0 0 0 0  Altered sleeping 0 0 - - 0  Tired, decreased energy 0 0 - - 0  Change in appetite 0 0 - - 0  Feeling bad or failure about yourself  0  0 - - 0  Trouble concentrating 0 0 - - 0  Moving slowly or fidgety/restless 0 0 - - 0  Suicidal thoughts 0 0 - - 0  PHQ-9 Score 0 0 - - 0  Difficult doing work/chores - Not difficult at all - - -  Some recent data might be hidden    phq 9 is negative   Fall Risk: Fall Risk  11/23/2019 08/31/2019 06/15/2019 05/31/2019 01/30/2019  Falls in the past year? 0 - 0 0 0  Number falls in past yr: 0 - - 0 0  Injury with Fall? 0 - - 0 0  Risk for fall due to : - - Medication side effect - -  Risk for fall due to: Comment - - - - -  Follow up - Falls evaluation completed Falls prevention discussed - Falls prevention discussed     Functional Status Survey: Is the patient deaf or have difficulty hearing?: No Does the patient have difficulty seeing, even when wearing glasses/contacts?: No Does the patient have difficulty concentrating, remembering, or making decisions?: No Does the patient have difficulty walking or climbing stairs?: No Does the patient have difficulty dressing or bathing?: No Does the patient have difficulty doing errands alone such as visiting a doctor's office or shopping?: No    Assessment &  Plan  1. Insomnia, unspecified type  - traZODone (DESYREL) 50 MG tablet; Take 0.5-1 tablets (25-50 mg total) by mouth at bedtime as needed for sleep.  Dispense: 30 tablet; Refill: 0  2. Vertigo  Advised to go back to ENT, explained her symptoms of vertigo are not related to bp, but dizziness standing up is, stay hydrated and get up slowly to avoid falls, if bp at home drops frequently below 120 and or symptoms of dizziness gets worse we will adjust bp medication   3. Essential hypertension  bp is at goal

## 2019-11-28 ENCOUNTER — Ambulatory Visit: Payer: Self-pay

## 2019-12-03 ENCOUNTER — Ambulatory Visit: Payer: Medicare Other | Admitting: Family Medicine

## 2019-12-04 ENCOUNTER — Ambulatory Visit: Payer: Medicare Other | Admitting: Family Medicine

## 2019-12-05 NOTE — Chronic Care Management (AMB) (Signed)
  Chronic Care Management   Note   Name: [name] MRN: [id number] DOB: 03/07/1941    Successful contact with [name], however call dropped during the outreach. Line rang busy during subsequent attempts.    Follow up plan: Will reach out again next week if call is not returned today.   [facility] Care Management [phone]

## 2019-12-07 ENCOUNTER — Ambulatory Visit (INDEPENDENT_AMBULATORY_CARE_PROVIDER_SITE_OTHER): Payer: Medicare Other

## 2019-12-07 DIAGNOSIS — I10 Essential (primary) hypertension: Secondary | ICD-10-CM | POA: Diagnosis not present

## 2019-12-07 DIAGNOSIS — R42 Dizziness and giddiness: Secondary | ICD-10-CM

## 2019-12-10 NOTE — Patient Instructions (Addendum)
Thank you for allowing the Chronic Care Management team to participate in your care.  Goals Addressed            This Visit's Progress    Improve Ability to Self-Manage Chronic Illnesses       Current Barriers:   Chronic Disease Management support and education needs related to Hypertension and Atrial Fibrillation. Patient with history of TIA.  Case Manager Clinical Goal(s):   Over the next 90 days, patient will continue taking medications as prescribed.-Complete  Over the next 90 days, patient will complete provider appointments as scheduled  Over the next 90 days, patient will monitor her blood pressure and record readings.-Complete  Over the next 90 days, patient will continue adherence with cardiac prudent/heart healthy diet.-Complete    Interventions:   Reviewed blood pressure readings. Reports reading are within range.  Reviewed schedule and upcoming provider appointments.  Currently scheduled for outreach with Neurologist  on 07/04/20. Requesting to reschedule for an earlier evaluation (preferably July) Pending follow up with Dentistry team regarding mouthpiece for CPAP. Will assist with arranging appointments and follow-up next month.   Patient Self Care Activities:   Self administers medications  Attends provider appointments  Calls pharmacy for medication refills  Performs ADL's independently  Performs IADL's independently   Please see past updates related to this goal by clicking on the "Past Updates" button in the selected goal         [name] verbalized understanding of the information discussed during the telephonic outreach today. Declined need for mailed/printed instructions.   The care management team will assist with arranging specialty appointments and follow-up with [name] next month.   [facility] Care Management [phone]

## 2019-12-10 NOTE — Chronic Care Management (AMB) (Signed)
Chronic Care Management   Follow Up Note   12/10/2019 Name: CHRYSTINE FROGGE MRN: [id number] DOB: Dec 18, 1940  Primary Care Provider: Steele Sizer, MD Reason for referral : Chronic Care Management   MIAMI LATULIPPE is a 79 y.o. year old female who is a primary care patient of Steele Sizer, MD. She is currently engaged with the chronic care management team. A brief telephonic outreach was conducted today.  Review of Ms. Wilmeth's status, including review of consultants reports, relevant labs and test results was conducted today. Collaboration with appropriate care team members was performed as part of the comprehensive evaluation and provision of chronic care management services.    SDOH (Social Determinants of Health) assessments performed: No     Outpatient Encounter Medications as of 12/07/2019  Medication Sig  . amLODipine (NORVASC) 10 MG tablet TAKE 1 TABLET(10 MG) BY MOUTH DAILY  . apixaban (ELIQUIS) 5 MG TABS tablet TAKE 1 TABLET(5 MG) BY MOUTH TWICE DAILY  . diltiazem (CARDIZEM) 30 MG tablet Take 1 tablet (30 mg total) by mouth every 6 (six) hours as needed (for tachycardia/recurrent Afib.).  [name] meclizine (ANTIVERT) 25 MG tablet Take 25 mg by mouth 3 (three) times daily as needed for dizziness.  . Omega-3 Fatty Acids (FISH OIL PO) Take by mouth.  . promethazine (PHENERGAN) 12.5 MG tablet Take 1 tablet (12.5 mg total) by mouth every 8 (eight) hours as needed for nausea or vomiting.  . rosuvastatin (CRESTOR) 10 MG tablet Take 1 tablet (10 mg total) by mouth daily.  [name] Spacer/Aero-Hold Chamber Bags MISC 1 each by Does not apply route 2 (two) times daily.  . SYMBICORT 160-4.5 MCG/ACT inhaler INHALE 2 PUFFS INTO THE LUNGS TWICE DAILY  . traZODone (DESYREL) 50 MG tablet Take 0.5-1 tablets (25-50 mg total) by mouth at bedtime as needed for sleep.  [name] VITAMIN D PO Take by mouth.   No facility-administered encounter medications on file as of 12/07/2019.      Goals Addressed              This Visit's Progress   . Improve Ability to Self-Manage Chronic Illnesses       Current Barriers:  . Chronic Disease Management support and education needs related to Hypertension and Atrial Fibrillation. Patient with history of TIA.  Case Manager Clinical Goal(s):  [name] Over the next 90 days, patient will continue taking medications as prescribed.-Complete . Over the next 90 days, patient will complete provider appointments as scheduled . Over the next 90 days, patient will monitor her blood pressure and record readings.-Complete . Over the next 90 days, patient will continue adherence with cardiac prudent/heart healthy diet.-Complete    Interventions:  . Reviewed blood pressure readings. Reports reading are within range. . Reviewed schedule and upcoming provider appointments.  Currently scheduled for outreach with Neurologist  on 07/04/20. Requesting to reschedule for an earlier evaluation (preferably July) Pending follow up with Dentistry team regarding mouthpiece for CPAP. Will assist with arranging appointments and follow-up next month.   Patient Self Care Activities:  . Self administers medications . Attends provider appointments . Calls pharmacy for medication refills . Performs ADL's independently . Performs IADL's independently   Please see past updates related to this goal by clicking on the "Past Updates" button in the selected goal          PLAN The care management team will assist with arranging specialty appointments and follow-up with Ms. Sundberg next month.   [facility] Care  Management [phone]

## 2019-12-12 ENCOUNTER — Ambulatory Visit: Payer: Self-pay

## 2019-12-12 NOTE — Chronic Care Management (AMB) (Signed)
  Chronic Care Management    12/12/2019 Name: SADEEN WIEGEL MRN: [id number] DOB: December 12, 1940   Care Coordination Only: Appointment During previous outreach, Ms. Bry requested an earlier evaluation with Neurology to discuss unresolved symptoms related to vertigo.   Update: [facility] Currently scheduled with Gayland Curry on 07/04/20. Neurology staff will schedule an additional appointment on 01/02/20.    Follow up plan: Informed Ms. Harada of Neurology visit on 01/02/20. The care management team will follow-up as scheduled on 01/04/20.    [facility] Care Management [phone]

## 2020-01-02 DIAGNOSIS — I729 Aneurysm of unspecified site: Secondary | ICD-10-CM | POA: Diagnosis not present

## 2020-01-02 DIAGNOSIS — M5481 Occipital neuralgia: Secondary | ICD-10-CM | POA: Diagnosis not present

## 2020-01-02 DIAGNOSIS — G4733 Obstructive sleep apnea (adult) (pediatric): Secondary | ICD-10-CM | POA: Diagnosis not present

## 2020-01-02 DIAGNOSIS — H93A3 Pulsatile tinnitus, bilateral: Secondary | ICD-10-CM | POA: Diagnosis not present

## 2020-01-02 DIAGNOSIS — H8113 Benign paroxysmal vertigo, bilateral: Secondary | ICD-10-CM | POA: Diagnosis not present

## 2020-01-04 ENCOUNTER — Ambulatory Visit (INDEPENDENT_AMBULATORY_CARE_PROVIDER_SITE_OTHER): Payer: Medicare Other

## 2020-01-04 ENCOUNTER — Other Ambulatory Visit: Payer: Self-pay | Admitting: Family Medicine

## 2020-01-04 DIAGNOSIS — I10 Essential (primary) hypertension: Secondary | ICD-10-CM | POA: Diagnosis not present

## 2020-01-04 DIAGNOSIS — Z1231 Encounter for screening mammogram for malignant neoplasm of breast: Secondary | ICD-10-CM

## 2020-01-04 NOTE — Chronic Care Management (AMB) (Signed)
Chronic Care Management   Follow Up Note   01/04/2020 Name: [name] MRN: [id number] DOB: 09/18/1940  Primary Care Provider: Steele Sizer, MD Reason for referral : Chronic Care Management   [name] is a 79 y.o. year old female who is a primary care patient of Steele Sizer, MD. She is currently engaged with the chronic care management team.  Review of [name]'s status, including review of consultants reports, relevant labs and test results was conducted today. Collaboration with appropriate care team members was performed as part of the comprehensive evaluation and provision of chronic care management services.     SDOH (Social Determinants of Health) assessments performed: No     Outpatient Encounter Medications as of 01/04/2020  Medication Sig  . amLODipine (NORVASC) 10 MG tablet TAKE 1 TABLET(10 MG) BY MOUTH DAILY  . apixaban (ELIQUIS) 5 MG TABS tablet TAKE 1 TABLET(5 MG) BY MOUTH TWICE DAILY  . diltiazem (CARDIZEM) 30 MG tablet Take 1 tablet (30 mg total) by mouth every 6 (six) hours as needed (for tachycardia/recurrent Afib.).  [name] meclizine (ANTIVERT) 25 MG tablet Take 25 mg by mouth 3 (three) times daily as needed for dizziness.  . Omega-3 Fatty Acids (FISH OIL PO) Take by mouth.  . promethazine (PHENERGAN) 12.5 MG tablet Take 1 tablet (12.5 mg total) by mouth every 8 (eight) hours as needed for nausea or vomiting.  . rosuvastatin (CRESTOR) 10 MG tablet Take 1 tablet (10 mg total) by mouth daily.  [name] Spacer/Aero-Hold Chamber Bags MISC 1 each by Does not apply route 2 (two) times daily.  . SYMBICORT 160-4.5 MCG/ACT inhaler INHALE 2 PUFFS INTO THE LUNGS TWICE DAILY  . traZODone (DESYREL) 50 MG tablet Take 0.5-1 tablets (25-50 mg total) by mouth at bedtime as needed for sleep.  [name] VITAMIN D PO Take by mouth.   No facility-administered encounter medications on file as of 01/04/2020.      Goals Addressed            This Visit's Progress   . Improve  Ability to Self-Manage Chronic Illnesses       Current Barriers:  . Chronic Disease Management support and education needs related to Hypertension and Atrial Fibrillation. Patient with history of TIA.  Case Manager Clinical Goal(s):  [name] Over the next 90 days, patient will continue taking medications as prescribed.-Complete . Over the next 90 days, patient will complete provider appointments as scheduled-Complete . Over the next 90 days, patient will monitor her blood pressure and record readings.-Complete . Over the next 90 days, patient will continue adherence with cardiac prudent/heart healthy diet.-Complete    Interventions:  . Discussed follow up plan for care management.  Remains compliant with medications and provider treatment recommendations. Reports home blood pressure readings have been within range with systolic readings in the 629'B' . Reports a reading of 140/79 during recent Neurology appointment. Reports readings tend to run higher during clinic visits. Anticipating treatment to resolve vertigo related symptoms. She was evaluated by Neurology early this week Reports gabapentin was offered. She declined and  prefers not to take additional medications at this time. She is interested in Vestibular Therapy and plans to follow-up with clinic to discuss. She will also follow-up with a new Dentistry team regarding options for a mouthpiece.  Overall doing well and remains very motivated to improve quality of health.    Patient Self Care Activities:  . Self administers medications . Attends provider appointments . Calls pharmacy for medication refills .  Performs ADL's independently . Performs IADL's independently   Please see past updates related to this goal by clicking on the "Past Updates" button in the selected goal         PLAN The care management team will follow-up with [name] next month.   [facility] Care  Management [phone]

## 2020-01-07 NOTE — Patient Instructions (Signed)
Thank you for allowing the Chronic Care Management team to participate in your care.   Goals Addressed            This Visit's Progress   . Improve Ability to Self-Manage Chronic Illnesses       Current Barriers:  . Chronic Disease Management support and education needs related to Hypertension and Atrial Fibrillation. Patient with history of TIA.  Case Manager Clinical Goal(s):  [name] Over the next 90 days, patient will continue taking medications as prescribed.-Complete . Over the next 90 days, patient will complete provider appointments as scheduled-Complete . Over the next 90 days, patient will monitor her blood pressure and record readings.-Complete . Over the next 90 days, patient will continue adherence with cardiac prudent/heart healthy diet.-Complete    Interventions:  . Discussed follow up plan for care management.  Remains compliant with medications and provider treatment recommendations. Reports home blood pressure readings have been within range with systolic readings in the 179'X' . Reports a reading of 140/79 during recent Neurology appointment. Reports readings tend to run higher during clinic visits. Anticipating treatment to resolve vertigo related symptoms. She was evaluated by Neurology early this week Reports gabapentin was offered. She declined and  prefers not to take additional medications at this time. She is interested in Vestibular Therapy and plans to follow-up with clinic to discuss. She will also follow-up with a new Dentistry team regarding options for a mouthpiece.  Overall doing well and remains very motivated to improve quality of health.    Patient Self Care Activities:  . Self administers medications . Attends provider appointments . Calls pharmacy for medication refills . Performs ADL's independently . Performs IADL's independently   Please see past updates related to this goal by clicking on the "Past Updates" button in the selected goal          [name] verbalized understanding of the information discussed during the telephonic outreach today. Declined need for a mailed/printed copy of the instructions.   The care management team will follow-up with [name] next month.    [facility] Care Management [phone]

## 2020-01-09 ENCOUNTER — Other Ambulatory Visit: Payer: Self-pay | Admitting: Neurology

## 2020-01-09 DIAGNOSIS — H93A3 Pulsatile tinnitus, bilateral: Secondary | ICD-10-CM

## 2020-01-11 ENCOUNTER — Ambulatory Visit: Payer: Self-pay

## 2020-01-11 NOTE — Chronic Care Management (AMB) (Signed)
  Chronic Care Management   Note  01/11/2020 Name: [name] MRN: [id number] DOB: [birth year]   Care Coordination Only: Educational and resource documents submitted.    Follow up plan: The care management team will follow-up as scheduled next month.    [facility] Care Management [phone]

## 2020-01-23 ENCOUNTER — Other Ambulatory Visit: Payer: Self-pay

## 2020-01-23 ENCOUNTER — Ambulatory Visit
Admission: RE | Admit: 2020-01-23 | Discharge: 2020-01-23 | Disposition: A | Discharge status: Home / Self Care | Payer: Medicare Other | Source: Ambulatory Visit | Attending: Neurology | Admitting: Neurology

## 2020-01-23 DIAGNOSIS — H93A3 Pulsatile tinnitus, bilateral: Secondary | ICD-10-CM

## 2020-01-23 DIAGNOSIS — I671 Cerebral aneurysm, nonruptured: Secondary | ICD-10-CM | POA: Diagnosis not present

## 2020-01-31 ENCOUNTER — Ambulatory Visit (INDEPENDENT_AMBULATORY_CARE_PROVIDER_SITE_OTHER): Payer: Medicare Other

## 2020-01-31 DIAGNOSIS — Z1211 Encounter for screening for malignant neoplasm of colon: Secondary | ICD-10-CM | POA: Diagnosis not present

## 2020-01-31 DIAGNOSIS — Z0000 Encounter for general adult medical examination without abnormal findings: Secondary | ICD-10-CM | POA: Diagnosis not present

## 2020-01-31 NOTE — Patient Instructions (Signed)
Ms. [name] , Thank you for taking time to come for your Medicare Wellness Visit. I appreciate your ongoing commitment to your health goals. Please review the following plan we discussed and let me know if I can assist you in the future.   Screening recommendations/referrals: Colonoscopy: done 03/04/15. Referral sent to gastroenterology today.  Mammogram: done 01/30/19. Scheduled for 02/01/20.  Bone Density: done 06/08/18 Recommended yearly ophthalmology/optometry visit for glaucoma screening and checkup Recommended yearly dental visit for hygiene and checkup  Vaccinations: Influenza vaccine: done 04/16/19 Pneumococcal vaccine: done 05/31/19 Tdap vaccine: done 02/22/12 Shingles vaccine: 1st dose 12/04/19 due for second dose   Covid-19:done 07/05/19 & 07/26/19  Advanced directives: Please bring a copy of your health care power of attorney and living will to the office at your convenience.  Conditions/risks identified: Keep up the great work!  Next appointment: Follow up in one year for your annual wellness visit    Preventive Care 65 Years and Older, Female Preventive care refers to lifestyle choices and visits with your health care provider that can promote health and wellness. What does preventive care include?  A yearly physical exam. This is also called an annual well check.  Dental exams once or twice a year.  Routine eye exams. Ask your health care provider how often you should have your eyes checked.  Personal lifestyle choices, including:  Daily care of your teeth and gums.  Regular physical activity.  Eating a healthy diet.  Avoiding tobacco and drug use.  Limiting alcohol use.  Practicing safe sex.  Taking low-dose aspirin every day.  Taking vitamin and mineral supplements as recommended by your health care provider. What happens during an annual well check? The services and screenings done by your health care provider during your annual well check will depend on your  age, overall health, lifestyle risk factors, and family history of disease. Counseling  Your health care provider may ask you questions about your:  Alcohol use.  Tobacco use.  Drug use.  Emotional well-being.  Home and relationship well-being.  Sexual activity.  Eating habits.  History of falls.  Memory and ability to understand (cognition).  Work and work Statistician.  Reproductive health. Screening  You may have the following tests or measurements:  Height, weight, and BMI.  Blood pressure.  Lipid and cholesterol levels. These may be checked every 5 years, or more frequently if you are over 29 years old.  Skin check.  Lung cancer screening. You may have this screening every year starting at age 83 if you have a 30-pack-year history of smoking and currently smoke or have quit within the past 15 years.  Fecal occult blood test (FOBT) of the stool. You may have this test every year starting at age 49.  Flexible sigmoidoscopy or colonoscopy. You may have a sigmoidoscopy every 5 years or a colonoscopy every 10 years starting at age 56.  Hepatitis C blood test.  Hepatitis B blood test.  Sexually transmitted disease (STD) testing.  Diabetes screening. This is done by checking your blood sugar (glucose) after you have not eaten for a while (fasting). You may have this done every 1-3 years.  Bone density scan. This is done to screen for osteoporosis. You may have this done starting at age 45.  Mammogram. This may be done every 1-2 years. Talk to your health care provider about how often you should have regular mammograms. Talk with your health care provider about your test results, treatment options, and if necessary, the  need for more tests. Vaccines  Your health care provider may recommend certain vaccines, such as:  Influenza vaccine. This is recommended every year.  Tetanus, diphtheria, and acellular pertussis (Tdap, Td) vaccine. You may need a Td booster  every 10 years.  Zoster vaccine. You may need this after age 2.  Pneumococcal 13-valent conjugate (PCV13) vaccine. One dose is recommended after age 49.  Pneumococcal polysaccharide (PPSV23) vaccine. One dose is recommended after age 64. Talk to your health care provider about which screenings and vaccines you need and how often you need them. This information is not intended to replace advice given to you by your health care provider. Make sure you discuss any questions you have with your health care provider. Document Released: 07/11/2015 Document Revised: 03/03/2016 Document Reviewed: 04/15/2015 Elsevier Interactive Patient Education  2017 [place] Prevention in the Home Falls can cause injuries. They can happen to people of all ages. There are many things you can do to make your home safe and to help prevent falls. What can I do on the outside of my home?  Regularly fix the edges of walkways and driveways and fix any cracks.  Remove anything that might make you trip as you walk through a door, such as a raised step or threshold.  Trim any bushes or trees on the path to your home.  Use bright outdoor lighting.  Clear any walking paths of anything that might make someone trip, such as rocks or tools.  Regularly check to see if handrails are loose or broken. Make sure that both sides of any steps have handrails.  Any raised decks and porches should have guardrails on the edges.  Have any leaves, snow, or ice cleared regularly.  Use sand or salt on walking paths during winter.  Clean up any spills in your garage right away. This includes oil or grease spills. What can I do in the bathroom?  Use night lights.  Install grab bars by the toilet and in the tub and shower. Do not use towel bars as grab bars.  Use non-skid mats or decals in the tub or shower.  If you need to sit down in the shower, use a plastic, non-slip stool.  Keep the floor dry. Clean up any  water that spills on the floor as soon as it happens.  Remove soap buildup in the tub or shower regularly.  Attach bath mats securely with double-sided non-slip rug tape.  Do not have throw rugs and other things on the floor that can make you trip. What can I do in the bedroom?  Use night lights.  Make sure that you have a light by your bed that is easy to reach.  Do not use any sheets or blankets that are too big for your bed. They should not hang down onto the floor.  Have a firm chair that has side arms. You can use this for support while you get dressed.  Do not have throw rugs and other things on the floor that can make you trip. What can I do in the kitchen?  Clean up any spills right away.  Avoid walking on wet floors.  Keep items that you use a lot in easy-to-reach places.  If you need to reach something above you, use a strong step stool that has a grab bar.  Keep electrical cords out of the way.  Do not use floor polish or wax that makes floors slippery. If you must use wax,  use non-skid floor wax.  Do not have throw rugs and other things on the floor that can make you trip. What can I do with my stairs?  Do not leave any items on the stairs.  Make sure that there are handrails on both sides of the stairs and use them. Fix handrails that are broken or loose. Make sure that handrails are as long as the stairways.  Check any carpeting to make sure that it is firmly attached to the stairs. Fix any carpet that is loose or worn.  Avoid having throw rugs at the top or bottom of the stairs. If you do have throw rugs, attach them to the floor with carpet tape.  Make sure that you have a light switch at the top of the stairs and the bottom of the stairs. If you do not have them, ask someone to add them for you. What else can I do to help prevent falls?  Wear shoes that:  Do not have high heels.  Have rubber bottoms.  Are comfortable and fit you well.  Are closed  at the toe. Do not wear sandals.  If you use a stepladder:  Make sure that it is fully opened. Do not climb a closed stepladder.  Make sure that both sides of the stepladder are locked into place.  Ask someone to hold it for you, if possible.  Clearly mark and make sure that you can see:  Any grab bars or handrails.  First and last steps.  Where the edge of each step is.  Use tools that help you move around (mobility aids) if they are needed. These include:  Canes.  Walkers.  Scooters.  Crutches.  Turn on the lights when you go into a dark area. Replace any light bulbs as soon as they burn out.  Set up your furniture so you have a clear path. Avoid moving your furniture around.  If any of your floors are uneven, fix them.  If there are any pets around you, be aware of where they are.  Review your medicines with your doctor. Some medicines can make you feel dizzy. This can increase your chance of falling. Ask your doctor what other things that you can do to help prevent falls. This information is not intended to replace advice given to you by your health care provider. Make sure you discuss any questions you have with your health care provider. Document Released: 04/10/2009 Document Revised: 11/20/2015 Document Reviewed: 07/19/2014 Elsevier Interactive Patient Education  2017 [employer].

## 2020-01-31 NOTE — Progress Notes (Signed)
Subjective:   [name] is a 79 y.o. female who presents for Medicare Annual (Subsequent) preventive examination.  Virtual Visit via Telephone Note  I connected with  ALISEA MATTE on 01/31/20 at  9:20 AM EDT by telephone and verified that I am speaking with the correct person using two identifiers.  Medicare Annual Wellness visit completed telephonically due to Covid-19 pandemic.   Location: Patient: home Provider: office   I discussed the limitations, risks, security and privacy concerns of performing an evaluation and management service by telephone and the availability of in person appointments. The patient expressed understanding and agreed to proceed.  Unable to perform video visit due to video visit attempted and failed and/or patient does not have video capability.   Some vital signs may be absent or patient reported.   Clemetine Marker, LPN    Review of Systems     Cardiac Risk Factors include: advanced age (>17men, >85 women);hypertension;obesity (BMI >30kg/m2);dyslipidemia     Objective:    There were no vitals filed for this visit. There is no height or weight on file to calculate BMI.  Advanced Directives 01/31/2020 01/30/2019 01/24/2018 01/22/2018 01/22/2018 01/22/2018 01/04/2017  Does Patient Have a Medical Advance Directive? Yes Yes No No No No No  Type of Paramedic of [place];Living will [place];Living will - - - - -  Copy of [place] in Chart? No - copy requested No - copy requested - - - - -  Would patient like information on creating a medical advance directive? - - Yes (MAU/Ambulatory/Procedural Areas - Information given) No - Patient declined - - -    Current Medications (verified) Outpatient Encounter Medications as of 01/31/2020  Medication Sig  . amLODipine (NORVASC) 10 MG tablet TAKE 1 TABLET(10 MG) BY MOUTH DAILY  . apixaban (ELIQUIS) 5 MG TABS tablet TAKE 1 TABLET(5 MG) BY MOUTH  TWICE DAILY  . diltiazem (CARDIZEM) 30 MG tablet Take 1 tablet (30 mg total) by mouth every 6 (six) hours as needed (for tachycardia/recurrent Afib.).  [name] meclizine (ANTIVERT) 25 MG tablet Take 25 mg by mouth 3 (three) times daily as needed for dizziness.  . Omega-3 Fatty Acids (FISH OIL PO) Take by mouth.  . rosuvastatin (CRESTOR) 10 MG tablet Take 1 tablet (10 mg total) by mouth daily.  [name] Spacer/Aero-Hold Chamber Bags MISC 1 each by Does not apply route 2 (two) times daily.  . SYMBICORT 160-4.5 MCG/ACT inhaler INHALE 2 PUFFS INTO THE LUNGS TWICE DAILY  . traZODone (DESYREL) 50 MG tablet Take 0.5-1 tablets (25-50 mg total) by mouth at bedtime as needed for sleep.  . promethazine (PHENERGAN) 12.5 MG tablet Take 1 tablet (12.5 mg total) by mouth every 8 (eight) hours as needed for nausea or vomiting. (Patient not taking: Reported on 01/31/2020)  . [DISCONTINUED] VITAMIN D PO Take by mouth.   No facility-administered encounter medications on file as of 01/31/2020.    Allergies (verified) Ace inhibitors and Penicillins   History: Past Medical History:  Diagnosis Date  . History of stress test    a. 02/2013 Nl stress test.  . Hypertension   . Insomnia, unspecified   . Mitral regurgitation    a. echo 01/2016: nl LV sys fxn, mild MR, w/o pulm htn, nl atrial size  . Osteoporosis, unspecified   . Paroxysmal atrial fibrillation ([place]) 01/2016   a. diagnosed 01/2016; b. has been on eliquis, pradaxa, and xarelto at varying times 2/2 cost of  each medication-->currently on eliquis (11/2016); c. CHADS2VASc --> 3 (HTN, age x 1, female).  . Pure hypercholesterolemia   . TIA (transient ischemic attack)   . Unspecified asthma(493.90)   . Vertigo    Past Surgical History:  Procedure Laterality Date  . ABDOMINAL HYSTERECTOMY    . CATARACT EXTRACTION Right 08/31/2016  . COLONOSCOPY WITH PROPOFOL N/A 03/04/2015   Procedure: COLONOSCOPY WITH PROPOFOL;  Surgeon: Lucilla Lame, MD;  Location: [facility] ENDOSCOPY;   Service: Endoscopy;  Laterality: N/A;   Family History  Problem Relation Age of Onset  . COPD Father   . Hypertension Mother   . Aneurysm Mother   . Hypertension Brother   . Stroke Brother   . Cancer Brother        pancreatic  . Healthy Brother    Social History   Socioeconomic History  . Marital status: Single    Spouse name: Not on file  . Number of children: 2  . Years of education: Not on file  . Highest education level: Associate degree: academic program  Occupational History  . Occupation: Retired  Tobacco Use  . Smoking status: Never Smoker  . Smokeless tobacco: Never Used  . Tobacco comment: smoking cessation materials not required  Vaping Use  . Vaping Use: Never used  Substance and Sexual Activity  . Alcohol use: No    Alcohol/week: 0.0 standard drinks  . Drug use: No  . Sexual activity: Not Currently    Partners: Male  Other Topics Concern  . Not on file  Social History Narrative   Independent at baseline. Lives alone.    Social Determinants of Health   Financial Resource Strain: Low Risk   . Difficulty of Paying Living Expenses: Not hard at all  Food Insecurity: No Food Insecurity  . Worried About Charity fundraiser in the Last Year: Never true  . Ran Out of Food in the Last Year: Never true  Transportation Needs: No Transportation Needs  . Lack of Transportation (Medical): No  . Lack of Transportation (Non-Medical): No  Physical Activity: Insufficiently Active  . Days of Exercise per Week: 3 days  . Minutes of Exercise per Session: 30 min  Stress: No Stress Concern Present  . Feeling of Stress : Not at all  Social Connections: Moderately Integrated  . Frequency of Communication with Friends and Family: More than three times a week  . Frequency of Social Gatherings with Friends and Family: More than three times a week  . Attends Religious Services: More than 4 times per year  . Active Member of Clubs or Organizations: Yes  . Attends Theatre manager Meetings: More than 4 times per year  . Marital Status: Never married    Tobacco Counseling Counseling given: Not Answered Comment: smoking cessation materials not required   Clinical Intake:  Pre-visit preparation completed: Yes  Pain : No/denies pain     Nutritional Risks: None Diabetes: No  How often do you need to have someone help you when you read instructions, pamphlets, or other written materials from your doctor or pharmacy?: 1 - Never    Interpreter Needed?: No  Information entered by :: Clemetine Marker LPN   Activities of Daily Living In your present state of health, do you have any difficulty performing the following activities: 01/31/2020 11/23/2019  Hearing? N N  Comment declines hearing aids -  Vision? N N  Difficulty concentrating or making decisions? N N  Walking or climbing stairs? N N  Dressing  or bathing? N N  Doing errands, shopping? N N  Preparing Food and eating ? N -  Using the Toilet? N -  In the past six months, have you accidently leaked urine? N -  Do you have problems with loss of bowel control? N -  Managing your Medications? N -  Managing your Finances? N -  Housekeeping or managing your Housekeeping? N -  Some recent data might be hidden    Patient Care Team: Steele Sizer, MD as PCP - General (Family Medicine) Wellington Hampshire, MD as PCP - Cardiology (Cardiology) Wellington Hampshire, MD as Consulting Physician (Cardiology) Vladimir Crofts, MD as Consulting Physician (Neurology) Lucilla Lame, MD as Consulting Physician (Gastroenterology) Neldon Labella, RN as Case Manager  Indicate any recent [place] you may have received from other than [facility] providers in the past year (date may be approximate).     Assessment:   This is a routine wellness examination for Dorothe.  Hearing/Vision screen  Hearing Screening   125Hz  250Hz  500Hz  1000Hz  2000Hz  3000Hz  4000Hz  6000Hz  8000Hz   Right ear:           Left ear:            Comments: Pt denies hearing difficulty  Vision Screening Comments: Annual vision screenings done at [facility] issues and exercise activities discussed: Current Exercise Habits: Home exercise routine, Type of exercise: walking, Time (Minutes): 30, Frequency (Times/Week): 3, Weekly Exercise (Minutes/Week): 90, Intensity: Mild, Exercise limited by: None identified  Goals    . DIET - INCREASE WATER INTAKE     Recommend to drink at least 6-8 8oz glasses of water per day.    . Improve Ability to Self-Manage Chronic Illnesses     Current Barriers:  . Chronic Disease Management support and education needs related to Hypertension and Atrial Fibrillation. Patient with history of TIA.  Case Manager Clinical Goal(s):  [name] Over the next 90 days, patient will continue taking medications as prescribed.-Complete . Over the next 90 days, patient will complete provider appointments as scheduled-Complete . Over the next 90 days, patient will monitor her blood pressure and record readings.-Complete . Over the next 90 days, patient will continue adherence with cardiac prudent/heart healthy diet.-Complete    Interventions:  . Discussed follow up plan for care management.  Remains compliant with medications and provider treatment recommendations. Reports home blood pressure readings have been within range with systolic readings in the 948'N' . Reports a reading of 140/79 during recent Neurology appointment. Reports readings tend to run higher during clinic visits. Anticipating treatment to resolve vertigo related symptoms. She was evaluated by Neurology early this week Reports gabapentin was offered. She declined and  prefers not to take additional medications at this time. She is interested in Vestibular Therapy and plans to follow-up with clinic to discuss. She will also follow-up with a new Dentistry team regarding options for a mouthpiece.  Overall doing well and remains very motivated to  improve quality of health.    Patient Self Care Activities:  . Self administers medications . Attends provider appointments . Calls pharmacy for medication refills . Performs ADL's independently . Performs IADL's independently   Please see past updates related to this goal by clicking on the "Past Updates" button in the selected goal        Depression Screen PHQ 2/9 Scores 01/31/2020 11/23/2019 08/31/2019 08/22/2019 06/15/2019 05/31/2019 01/30/2019  PHQ - 2 Score 0 0 0 0 0 0 0  PHQ-  9 Score - 0 0 - - 0 -    Fall Risk Fall Risk  01/31/2020 11/23/2019 08/31/2019 06/15/2019 05/31/2019  Falls in the past year? 0 0 - 0 0  Number falls in past yr: 0 0 - - 0  Injury with Fall? 0 0 - - 0  Risk for fall due to : No Fall Risks - - Medication side effect -  Risk for fall due to: Comment - - - - -  Follow up Falls prevention discussed - Falls evaluation completed Falls prevention discussed -    Any stairs in or around the home? No  If so, are there any without handrails? No  Home free of loose throw rugs in walkways, pet beds, electrical cords, etc? Yes  Adequate lighting in your home to reduce risk of falls? Yes   ASSISTIVE DEVICES UTILIZED TO PREVENT FALLS:  Life alert? No  Use of a cane, walker or w/c? No  Grab bars in the bathroom? Yes  Shower chair or bench in shower? Yes  Elevated toilet seat or a handicapped toilet? No   TIMED UP AND GO:  Was the test performed? No . Telephonic visit.   Cognitive Function:     6CIT Screen 01/30/2019 01/24/2018  What Year? 0 points 0 points  What month? 0 points 0 points  What time? 0 points 0 points  Count back from 20 0 points 0 points  Months in reverse 0 points 0 points  Repeat phrase 0 points 2 points  Total Score 0 2    Immunizations Immunization History  Administered Date(s) Administered  . Fluad Quad(high Dose 65+) 04/16/2019  . Influenza, High Dose Seasonal PF 02/20/2015, 05/11/2017, 05/04/2018  . Influenza-Unspecified 02/20/2016  .  PFIZER SARS-COV-2 Vaccination 07/05/2019, 07/26/2019  . Pneumococcal Conjugate-13 10/17/2017  . Pneumococcal Polysaccharide-23 05/31/2019  . Tdap 02/22/2012  . Zoster Recombinat (Shingrix) 12/04/2019    TDAP status: Up to date   Flu Vaccine status: Up to date   Pneumococcal vaccine status: Up to date   Covid-19 vaccine status: Completed vaccines  Qualifies for Shingles Vaccine? Yes   Zostavax completed Yes   Shingrix Completed?: Yes  Pt completed 1st dose and schedule for second dose 02/04/20.   Screening Tests Health Maintenance  Topic Date Due  . Hepatitis C Screening  Never done  . MAMMOGRAM  01/30/2020  . INFLUENZA VACCINE  01/27/2020  . COLONOSCOPY  03/03/2020  . TETANUS/TDAP  02/21/2022  . DEXA SCAN  Completed  . COVID-19 Vaccine  Completed  . PNA vac Low Risk Adult  Completed    Health Maintenance  Health Maintenance Due  Topic Date Due  . Hepatitis C Screening  Never done  . MAMMOGRAM  01/30/2020  . INFLUENZA VACCINE  01/27/2020    Colorectal cancer screening: Completed 03/04/15. Repeat every 5 years   Mammogram status: Completed 01/30/19. Repeat every year   Bone Density status: Completed 06/08/18. Results reflect: Bone density results: NORMAL. Repeat every 2 years.  Lung Cancer Screening: (Low Dose CT Chest recommended if Age 18-80 years, 30 pack-year currently smoking OR have quit w/in 15years.) does not qualify.     Additional Screening:  Hepatitis C Screening: does qualify; postponed  Vision Screening: Recommended annual ophthalmology exams for early detection of glaucoma and other disorders of the eye. Is the patient up to date with their annual eye exam?  Yes  Who is the provider or what is the name of the office in which the patient attends annual eye  exams? [facility]  Dental Screening: Recommended annual dental exams for proper oral hygiene  Community Resource Referral / Chronic Care Management: CRR required this visit?  No   CCM  required this visit?  No      Plan:     I have personally reviewed and noted the following in the patient's chart:   . Medical and social history . Use of alcohol, tobacco or illicit drugs  . Current medications and supplements . Functional ability and status . Nutritional status . Physical activity . Advanced directives . List of other physicians . Hospitalizations, surgeries, and ER visits in previous 12 months . Vitals . Screenings to include cognitive, depression, and falls . Referrals and appointments  In addition, I have reviewed and discussed with patient certain preventive protocols, quality metrics, and best practice recommendations. A written personalized care plan for preventive services as well as general preventive health recommendations were provided to patient.     Clemetine Marker, LPN   03/01/7653   Nurse Notes: pt doing well and appreciative of visit today

## 2020-02-06 DIAGNOSIS — Z20822 Contact with and (suspected) exposure to covid-19: Secondary | ICD-10-CM | POA: Diagnosis not present

## 2020-02-07 ENCOUNTER — Ambulatory Visit: Payer: Medicare Other | Admitting: Cardiovascular Disease

## 2020-02-14 ENCOUNTER — Ambulatory Visit: Payer: Self-pay

## 2020-02-21 NOTE — Progress Notes (Signed)
Name: [name]   MRN: [id number]    DOB: 1941-02-06   Date:02/22/2020       Progress Note  Subjective  Chief Complaint  Chief Complaint  Patient presents with  . Insomnia  . Dizziness  . Hypertension  . Prediabetes    Swelling  She has some swelling in her right foot that comes and goes. It swells mostly in the afternoons all around her ankle. She denies pain and injury to the site. Onset of swelling has been about 4 months.  She denies any associated redness or pain, no trauma or increase in warmth. She states had a sprain ankle on that side many years ago  HTN:she is now on norvasc,she denies chest pain ,denies decrease in exercise tolerance , very seldom now has palpitation, last episode a few months ago.  BP has been at goal, she has been compliant with medication   Pre-diabetes: hgb A1C was 5.9 %% Denies polyphagia, polydipsia or polyuria. She has been trying to avoid carbs.We will recheck labs today   Paroxysmal Afib: sees Dr. Fletcher Anon, symptoms started August 2017, she is onEliquisand prn Cardizem.Norecent episodes of fluttering last episode many months ago. She states episodes not associated with chest pain or diaphoresis but feels tired    History of CVA/TIA and also aneursym of left para ophthalmic artery , under the care of Dr. Manuella Ghazi- neurologistShe is now taking Crestor and denies side effects. She has occasional right frontal headache and soreness on right side of neck   MR angio head without contrast from 01/23/2020 compared with previous one done  09/08/2018:  IMPRESSION: Stable appearance of 3 mm left paraophthalmic ICA aneurysm.  No new aneurysm or high-grade narrowing.  Asthma: she states she has been doing well, only uses Symbicort about 4 times a week when she thinks about, she tends to forget since feeling well, no cough, wheezing or SOB at this time. Explained symbicort is only valid for 60 days after she opens package. 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 Manuella Ghazi- neurologist, unable to tolerate CPAP, sleep study done 06/30/2018, she was supposed to have an oral appliance,she states very close to get it now, dentist got the rx for the oral appliance and waiting for approval.   Vertigo: she has vertigo with head movement, motion sickness, and when she has an episode she needs to go to [facility] by EMS because of severe nausea, vomiting and diarrhea01/2018.She had one recent Summer of 2020 but resolved by itself . She states promethazine is more helpful than Meclizineprn. She came in in March and had noticed episodes not as intense, but having it more often , only happens when moving her head in bed and rapid head movements during the day very mild nausea, she described episodes as spinning, seen by ENT since last visit with me, had Epley Maneuver and she states no recent episodes .   Insomnia: she states a couple of times a week she has difficulty falling asleep. She states mind is busy at night and cannot wind down at times. She is still taking Trazodone prn   Patient Active Problem List   Diagnosis Date Noted  . Aneurysm ([place]) 02/08/2018  . History of CVA (cerebrovascular accident) without residual deficits 01/24/2018  . 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 TIA (transient ischemic attack) 01/22/2018  . Impingement syndrome of shoulder region 01/02/2018  . History of cataract surgery, right 09/01/2016  .  Paroxysmal atrial fibrillation ([place]) 02/24/2016  . Vertigo 02/24/2016  . Low HDL (under 40) 02/17/2016  . Dizziness 02/15/2016  . Hx of colonic polyps   . Benign neoplasm of sigmoid colon   . Asthma, mild intermittent 02/21/2015  . Hyperglycemia 02/21/2015  . Essential hypertension 02/20/2015  . Inconclusive mammogram due to dense breasts 01/02/2015  . Dense breast 01/02/2015  . Abnormal EKG 02/23/2013    Past Surgical History:  Procedure Laterality Date  .  ABDOMINAL HYSTERECTOMY    . CATARACT EXTRACTION Right 08/31/2016  . COLONOSCOPY WITH PROPOFOL N/A 03/04/2015   Procedure: COLONOSCOPY WITH PROPOFOL;  Surgeon: Lucilla Lame, MD;  Location: [facility] ENDOSCOPY;  Service: Endoscopy;  Laterality: N/A;    Family History  Problem Relation Age of Onset  . COPD Father   . Hypertension Mother   . Aneurysm Mother   . Hypertension Brother   . Stroke Brother   . Cancer Brother        pancreatic  . Healthy Brother     Social History   Tobacco Use  . Smoking status: Never Smoker  . Smokeless tobacco: Never Used  . Tobacco comment: smoking cessation materials not required  Substance Use Topics  . Alcohol use: No    Alcohol/week: 0.0 standard drinks     Current Outpatient Medications:  .  amLODipine (NORVASC) 10 MG tablet, TAKE 1 TABLET(10 MG) BY MOUTH DAILY, Disp: 90 tablet, Rfl: 0 .  apixaban (ELIQUIS) 5 MG TABS tablet, TAKE 1 TABLET(5 MG) BY MOUTH TWICE DAILY, Disp: 60 tablet, Rfl: 6 .  diltiazem (CARDIZEM) 30 MG tablet, Take 1 tablet (30 mg total) by mouth every 6 (six) hours as needed (for tachycardia/recurrent Afib.)., Disp: 30 tablet, Rfl: 3 .  meclizine (ANTIVERT) 25 MG tablet, Take 25 mg by mouth 3 (three) times daily as needed for dizziness., Disp: , Rfl:  .  Omega-3 Fatty Acids (FISH OIL PO), Take by mouth., Disp: , Rfl:  .  promethazine (PHENERGAN) 12.5 MG tablet, Take 1 tablet (12.5 mg total) by mouth every 8 (eight) hours as needed for nausea or vomiting., Disp: 6 tablet, Rfl: 0 .  rosuvastatin (CRESTOR) 10 MG tablet, Take 1 tablet (10 mg total) by mouth daily., Disp: 90 tablet, Rfl: 1 .  Spacer/Aero-Hold [employer], 1 each by Does not apply route 2 (two) times daily., Disp: 1 each, Rfl: 0 .  SYMBICORT 160-4.5 MCG/ACT inhaler, INHALE 2 PUFFS INTO THE LUNGS TWICE DAILY, Disp: 10.2 g, Rfl: 0 .  traZODone (DESYREL) 50 MG tablet, Take 0.5-1 tablets (25-50 mg total) by mouth at bedtime as needed for sleep., Disp: 30 tablet, Rfl:  0  Allergies  Allergen Reactions  . Ace Inhibitors Swelling    Patient states her tongue swell.  [name] Penicillins Anxiety    Has patient had a PCN reaction causing immediate rash, facial/tongue/throat swelling, SOB or lightheadedness with hypotension: No Has patient had a PCN reaction causing severe rash involving mucus membranes or skin necrosis: No Has patient had a PCN reaction that required hospitalization No Has patient had a PCN reaction occurring within the last 10 years: No If all of the above answers are "NO", then may proceed with Cephalosporin use.    I personally reviewed active problem list, medication list, allergies, family history, social history, health maintenance with the patient/caregiver today.   ROS  Constitutional: Negative for fever or weight change.  Respiratory: Negative for cough and shortness of breath.   Cardiovascular: Negative for chest pain or palpitations.  Gastrointestinal: Negative for abdominal pain, no bowel changes.  Musculoskeletal: Negative for gait problem or joint swelling.  Skin: Negative for rash.  Neurological: Negative for dizziness or headache.  No other specific complaints in a complete review of systems (except as listed in HPI above).  Objective   Vitals:   02/22/20 0749  BP: 110/74  Pulse: 72  Resp: 16  Temp: 98.1 F (36.7 C)  TempSrc: Oral  SpO2: 99%  Weight: 181 lb 3.2 oz (82.2 kg)  Height: 5\' 3"  (1.6 m)    Body mass index is 32.1 kg/m.  Physical Exam  Constitutional: Patient appears well-developed and well-nourished. Obese No distress.  HEENT: head atraumatic, normocephalic, pupils equal and reactive to light,  neck supple, throat within normal limits Cardiovascular: Normal rate, regular rhythm and normal heart sounds.  No murmur heard. No BLE edema, both ankles are puffy. . Pulmonary/Chest: Effort normal and breath sounds normal. No respiratory distress. Abdominal: Soft.  There is no tenderness. Psychiatric:  Patient has a normal mood and affect. behavior is normal. Judgment and thought content normal.  PHQ2/9: Depression screen [facility] 2/9 02/22/2020 01/31/2020 11/23/2019 08/31/2019 08/22/2019  Decreased Interest 0 0 0 0 0  Down, Depressed, Hopeless 0 0 0 0 0  PHQ - 2 Score 0 0 0 0 0  Altered sleeping 0 - 0 0 -  Tired, decreased energy 0 - 0 0 -  Change in appetite 0 - 0 0 -  Feeling bad or failure about yourself  0 - 0 0 -  Trouble concentrating 0 - 0 0 -  Moving slowly or fidgety/restless 0 - 0 0 -  Suicidal thoughts 0 - 0 0 -  PHQ-9 Score 0 - 0 0 -  Difficult doing work/chores - - - Not difficult at all -  Some recent data might be hidden    phq 9 is negative   Fall Risk: Fall Risk  02/22/2020 01/31/2020 11/23/2019 08/31/2019 06/15/2019  Falls in the past year? 0 0 0 - 0  Number falls in past yr: 0 0 0 - -  Injury with Fall? 0 0 0 - -  Risk for fall due to : - No Fall Risks - - Medication side effect  Risk for fall due to: Comment - - - - -  Follow up - Falls prevention discussed - Falls evaluation completed Falls prevention discussed      Assessment & Plan  1. Paroxysmal atrial fibrillation (HCC)  Rate controlled  2. Need for hepatitis C screening test  - Hepatitis C antibody  3. History of TIA (transient ischemic attack)  - Lipid panel  4. Mild intermittent asthma without complication   5. Essential hypertension  - COMPLETE METABOLIC PANEL WITH GFR - CBC with Differential/Platelet  6. Aneurysm [facility])  Reviewed last CT angio  7. OSA (obstructive sleep apnea)  Waiting for oral appliance   8. Hyperglycemia  - Hemoglobin A1c  9. Mixed hyperlipidemia  - Lipid panel  10. Stage 3a chronic kidney disease  - COMPLETE METABOLIC PANEL WITH GFR - CBC with Differential/Platelet - VITAMIN D 25 Hydroxy (Vit-D Deficiency, Fractures)  11. Edema of right foot

## 2020-02-22 ENCOUNTER — Other Ambulatory Visit: Payer: Self-pay

## 2020-02-22 ENCOUNTER — Encounter: Payer: Self-pay | Admitting: Family Medicine

## 2020-02-22 ENCOUNTER — Ambulatory Visit (INDEPENDENT_AMBULATORY_CARE_PROVIDER_SITE_OTHER): Payer: Medicare Other | Admitting: Family Medicine

## 2020-02-22 VITALS — BP 110/74 | HR 72 | Temp 98.1°F | Resp 16 | Ht 63.0 in | Wt 181.2 lb

## 2020-02-22 DIAGNOSIS — I729 Aneurysm of unspecified site: Secondary | ICD-10-CM | POA: Diagnosis not present

## 2020-02-22 DIAGNOSIS — I10 Essential (primary) hypertension: Secondary | ICD-10-CM | POA: Diagnosis not present

## 2020-02-22 DIAGNOSIS — Z8673 Personal history of transient ischemic attack (TIA), and cerebral infarction without residual deficits: Secondary | ICD-10-CM

## 2020-02-22 DIAGNOSIS — R739 Hyperglycemia, unspecified: Secondary | ICD-10-CM

## 2020-02-22 DIAGNOSIS — E782 Mixed hyperlipidemia: Secondary | ICD-10-CM

## 2020-02-22 DIAGNOSIS — J4520 Mild intermittent asthma, uncomplicated: Secondary | ICD-10-CM | POA: Diagnosis not present

## 2020-02-22 DIAGNOSIS — G4733 Obstructive sleep apnea (adult) (pediatric): Secondary | ICD-10-CM | POA: Diagnosis not present

## 2020-02-22 DIAGNOSIS — N1831 Chronic kidney disease, stage 3a: Secondary | ICD-10-CM | POA: Diagnosis not present

## 2020-02-22 DIAGNOSIS — I480 Paroxysmal atrial fibrillation: Secondary | ICD-10-CM | POA: Diagnosis not present

## 2020-02-22 DIAGNOSIS — R600 Localized edema: Secondary | ICD-10-CM

## 2020-02-22 DIAGNOSIS — Z1159 Encounter for screening for other viral diseases: Secondary | ICD-10-CM | POA: Diagnosis not present

## 2020-02-24 ENCOUNTER — Other Ambulatory Visit: Payer: Self-pay | Admitting: Family Medicine

## 2020-02-24 ENCOUNTER — Other Ambulatory Visit: Payer: Self-pay | Admitting: Cardiovascular Disease

## 2020-02-24 DIAGNOSIS — Z8673 Personal history of transient ischemic attack (TIA), and cerebral infarction without residual deficits: Secondary | ICD-10-CM

## 2020-02-24 DIAGNOSIS — E782 Mixed hyperlipidemia: Secondary | ICD-10-CM

## 2020-02-24 DIAGNOSIS — I10 Essential (primary) hypertension: Secondary | ICD-10-CM

## 2020-02-25 ENCOUNTER — Telehealth (INDEPENDENT_AMBULATORY_CARE_PROVIDER_SITE_OTHER): Payer: Self-pay | Admitting: Gastroenterology

## 2020-02-25 ENCOUNTER — Telehealth: Payer: Self-pay

## 2020-02-25 DIAGNOSIS — Z860100 Personal history of colon polyps, unspecified: Secondary | ICD-10-CM

## 2020-02-25 LAB — COMPLETE METABOLIC PANEL WITHOUT GFR
AG Ratio: 1.4 (calc) (ref 1.0–2.5)
ALT: 21 U/L (ref 6–29)
AST: 19 U/L (ref 10–35)
Albumin: 4.2 g/dL (ref 3.6–5.1)
Alkaline phosphatase (APISO): 68 U/L (ref 37–153)
BUN/Creatinine Ratio: 16 (calc) (ref 6–22)
BUN: 16 mg/dL (ref 7–25)
CO2: 28 mmol/L (ref 20–32)
Calcium: 9.9 mg/dL (ref 8.6–10.4)
Chloride: 106 mmol/L (ref 98–110)
Creat: 1 mg/dL — ABNORMAL HIGH (ref 0.60–0.93)
GFR, Est African American: 63 mL/min/{1.73_m2}
GFR, Est Non African American: 54 mL/min/{1.73_m2} — ABNORMAL LOW
Globulin: 3.1 g/dL (ref 1.9–3.7)
Glucose, Bld: 97 mg/dL (ref 65–99)
Potassium: 4.6 mmol/L (ref 3.5–5.3)
Sodium: 139 mmol/L (ref 135–146)
Total Bilirubin: 0.3 mg/dL (ref 0.2–1.2)
Total Protein: 7.3 g/dL (ref 6.1–8.1)

## 2020-02-25 LAB — CBC WITH DIFFERENTIAL/PLATELET
Absolute Monocytes: 588 {cells}/uL (ref 200–950)
Basophils Absolute: 28 {cells}/uL (ref 0–200)
Basophils Relative: 0.5 %
Eosinophils Absolute: 112 {cells}/uL (ref 15–500)
Eosinophils Relative: 2 %
HCT: 43.3 % (ref 35.0–45.0)
Hemoglobin: 14.3 g/dL (ref 11.7–15.5)
Lymphs Abs: 1568 {cells}/uL (ref 850–3900)
MCH: 30.6 pg (ref 27.0–33.0)
MCHC: 33 g/dL (ref 32.0–36.0)
MCV: 92.5 fL (ref 80.0–100.0)
MPV: 10.9 fL (ref 7.5–12.5)
Monocytes Relative: 10.5 %
Neutro Abs: 3304 {cells}/uL (ref 1500–7800)
Neutrophils Relative %: 59 %
Platelets: 397 10*3/uL (ref 140–400)
RBC: 4.68 Million/uL (ref 3.80–5.10)
RDW: 13 % (ref 11.0–15.0)
Total Lymphocyte: 28 %
WBC: 5.6 10*3/uL (ref 3.8–10.8)

## 2020-02-25 LAB — LIPID PANEL
Cholesterol: 106 mg/dL
HDL: 46 mg/dL — ABNORMAL LOW
LDL Cholesterol (Calc): 47 mg/dL
Non-HDL Cholesterol (Calc): 60 mg/dL
Total CHOL/HDL Ratio: 2.3 (calc)
Triglycerides: 52 mg/dL

## 2020-02-25 LAB — HEPATITIS C ANTIBODY
Hepatitis C Ab: NONREACTIVE
SIGNAL TO CUT-OFF: 0.01

## 2020-02-25 LAB — HEMOGLOBIN A1C
Hgb A1c MFr Bld: 6.1 %{Hb} — ABNORMAL HIGH
Mean Plasma Glucose: 128 (calc)
eAG (mmol/L): 7.1 (calc)

## 2020-02-25 LAB — VITAMIN D 25 HYDROXY (VIT D DEFICIENCY, FRACTURES): Vit D, 25-Hydroxy: 20 ng/mL — ABNORMAL LOW (ref 30–100)

## 2020-02-25 MED ORDER — NA SULFATE-K SULFATE-MG SULF 17.5-3.13-1.6 GM/177ML PO SOLN: 1.0000 | Freq: Once | ORAL | 0 refills | Status: AC

## 2020-02-25 NOTE — Telephone Encounter (Signed)
Pt's age 79, wt 82.2 kg, SCr 1.00, CrCl 61.14, last ov w/ RD 07/31/19.

## 2020-02-25 NOTE — Progress Notes (Signed)
Gastroenterology Pre-Procedure Review  Request Date: Tuesday 04/01/20 Requesting Physician: Dr. Wohl  PATIENT REVIEW QUESTIONS: The patient responded to the following health history questions as indicated:    1. Are you having any GI issues? no 2. Do you have a personal history of Polyps? yes (03/04/15 Dr. Wohl noted polyps on colonoscopy report.) 3. Do you have a family history of Colon Cancer or Polyps? no 4. Diabetes Mellitus? no 5. Joint replacements in the past 12 months?no 6. Major health problems in the past 3 months?no 7. Any artificial heart valves, MVP, or defibrillator?no    MEDICATIONS & ALLERGIES:    Patient reports the following regarding taking any anticoagulation/antiplatelet therapy:   Plavix, Coumadin, Eliquis, Xarelto, Lovenox, Pradaxa, Brilinta, or Effient? yes (Eliquis blood thinner sent to Dr. Gollan's office.) Aspirin? no  Patient confirms/reports the following medications:  Current Outpatient Medications  Medication Sig Dispense Refill  . amLODipine (NORVASC) 10 MG tablet TAKE 1 TABLET(10 MG) BY MOUTH DAILY 90 tablet 0  . diltiazem (CARDIZEM) 30 MG tablet Take 1 tablet (30 mg total) by mouth every 6 (six) hours as needed (for tachycardia/recurrent Afib.). 30 tablet 3  . ELIQUIS 5 MG TABS tablet TAKE 1 TABLET(5 MG) BY MOUTH TWICE DAILY 60 tablet 6  . meclizine (ANTIVERT) 25 MG tablet Take 25 mg by mouth 3 (three) times daily as needed for dizziness.    . Na Sulfate-K Sulfate-Mg Sulf 17.5-3.13-1.6 GM/177ML SOLN Take 1 kit by mouth once for 1 dose. 354 mL 0  . Omega-3 Fatty Acids (FISH OIL PO) Take by mouth.    . promethazine (PHENERGAN) 12.5 MG tablet Take 1 tablet (12.5 mg total) by mouth every 8 (eight) hours as needed for nausea or vomiting. 6 tablet 0  . rosuvastatin (CRESTOR) 10 MG tablet TAKE 1 TABLET(10 MG) BY MOUTH DAILY 90 tablet 1  . Spacer/Aero-Hold Chamber Bags MISC 1 each by Does not apply route 2 (two) times daily. 1 each 0  . SYMBICORT 160-4.5  MCG/ACT inhaler INHALE 2 PUFFS INTO THE LUNGS TWICE DAILY 10.2 g 0  . traZODone (DESYREL) 50 MG tablet Take 0.5-1 tablets (25-50 mg total) by mouth at bedtime as needed for sleep. 30 tablet 0   No current facility-administered medications for this visit.    Patient confirms/reports the following allergies:  Allergies  Allergen Reactions  . Ace Inhibitors Swelling    Patient states her tongue swell.  . Penicillins Anxiety    Has patient had a PCN reaction causing immediate rash, facial/tongue/throat swelling, SOB or lightheadedness with hypotension: No Has patient had a PCN reaction causing severe rash involving mucus membranes or skin necrosis: No Has patient had a PCN reaction that required hospitalization No Has patient had a PCN reaction occurring within the last 10 years: No If all of the above answers are "NO", then may proceed with Cephalosporin use.    No orders of the defined types were placed in this encounter.   AUTHORIZATION INFORMATION Primary Insurance: 1D#: Group #:  Secondary Insurance: 1D#: Group #:  SCHEDULE INFORMATION: Date: Tuesday 04/01/20 Time: Location:[facility] 

## 2020-02-25 NOTE — Telephone Encounter (Signed)
Please refill. Thanks!

## 2020-02-26 ENCOUNTER — Other Ambulatory Visit: Payer: Self-pay

## 2020-02-27 NOTE — Progress Notes (Addendum)
Cardiology Office Note    Date:  03/04/2020   ID:  [name], DOB 02-May-1941, MRN [id number]  PCP:  Steele Sizer, MD  Cardiologist:  Kathlyn Sacramento, MD  Electrophysiologist:  None   Chief Complaint: Follow up  History of Present Illness:   [name] is a 79 y.o. female with history of PAF onEliquis, mitral regurgitation, TIA, vertigo,small ophthalmic artery aneurysm,HTN, HLD, insomnia, and osteoporosis who presents for follow up of her A. fib.  Prior stress test in 02/2013 showed no evidence of ischemia. She was admitted to the hospital in 01/2016 with vertigo and noted to be in new onset A. fib with RVR. She converted to sinus rhythm with diltiazem.Echo at that time showed an EF of 55 to 60%, mild concentric LVH, normal wall motion, mild mitral regurgitation, PASP normal. She was admitted to the hospital in 12/2017 with spasms involving her left hand. MRI of the brain showed no evidence of acute stroke. There was an incidental finding of a small ophthalmic artery aneurysm. She had no visual complaints. Echo showed normal LV systolic function with an EF of 60 to 65%, normal wall motion, grade 1 diastolic dysfunction, normal RV systolic function, no atrial septal defect or PFO, bubble study showed no atrial level shunting at baseline or with provocation, normal PASP, with no cardiac source of emboli. Carotid artery Doppler showed mild nonobstructive disease.She was seen in the office on 01/09/2019 and was doing well, though did note an episode of chest pain while lying in bed several days prior to that visit which was sharp and left-sided lasting for several seconds with spontaneous resolution. She underwent Lexiscan Myoview on 01/11/2019 which showed no significant ischemia, normal wall motion, EF estimated at 45%, GI uptake artifact, no EKG changes concerning for ischemia at peak stress or recovery. Overall, this was a low risk scan. Follow-up echo on 01/18/2019, to  confirm EF, showed an EF of 55 to 60%, normal LV cavity size, diastolic dysfunction, normal wall motion, normal RV systolic function, mild biatrial enlargement, trileaflet aortic valve with aortic annular calcification noted, trivial mitral regurgitation,normal size and structure aorta.  She was last seen in the office on 07/31/2019, and was doing well.   She comes in doing very well from a cardiac perspective.  No chest pain, dyspnea, palpitations, dizziness, presyncope, or syncope.  She does note some mild bilateral ankle swelling at the end of the day which typically improves when lying supine overnight.  No orthopnea, PND, abdominal distention, or early satiety.  No falls, hematochezia, or melena.  She is tolerating Eliquis without issues.  She has not needed any as needed diltiazem.  She ambulates around her school each morning without cardiac limitation.  She has received both of her Covid vaccines.   Labs independently reviewed: 01/2020 - A1c 6.1, TC 106, TG 52, HDL 46, LDL 47, HGB 14.3, PLT 397, BUN 16, SCr 1.00, potassium 4.6, albumin 4.2, AST/ALT normal    Past Medical History:  Diagnosis Date  . History of stress test    a. 02/2013 Nl stress test.  . Hypertension   . Insomnia, unspecified   . Mitral regurgitation    a. echo 01/2016: nl LV sys fxn, mild MR, w/o pulm htn, nl atrial size  . Osteoporosis, unspecified   . Paroxysmal atrial fibrillation ([employer]) 01/2016   a. diagnosed 01/2016; b. has been on eliquis, pradaxa, and xarelto at varying times 2/2 cost of each medication-->currently on eliquis (11/2016); c. CHADS2VASc --> 3 (  HTN, age x 1, female).  . Pure hypercholesterolemia   . TIA (transient ischemic attack)   . Unspecified asthma(493.90)   . Vertigo     Past Surgical History:  Procedure Laterality Date  . ABDOMINAL HYSTERECTOMY    . CATARACT EXTRACTION Right 08/31/2016  . COLONOSCOPY WITH PROPOFOL N/A 03/04/2015   Procedure: COLONOSCOPY WITH PROPOFOL;  Surgeon: Lucilla Lame, MD;  Location: [facility] ENDOSCOPY;  Service: Endoscopy;  Laterality: N/A;    Current Medications: Current Meds  Medication Sig  . amLODipine (NORVASC) 10 MG tablet TAKE 1 TABLET(10 MG) BY MOUTH DAILY  . diltiazem (CARDIZEM) 30 MG tablet Take 1 tablet (30 mg total) by mouth every 6 (six) hours as needed (for tachycardia/recurrent Afib.).  [name] ELIQUIS 5 MG TABS tablet TAKE 1 TABLET(5 MG) BY MOUTH TWICE DAILY  . meclizine (ANTIVERT) 25 MG tablet Take 25 mg by mouth 3 (three) times daily as needed for dizziness.  . Omega-3 Fatty Acids (FISH OIL PO) Take by mouth.  . promethazine (PHENERGAN) 12.5 MG tablet Take 1 tablet (12.5 mg total) by mouth every 8 (eight) hours as needed for nausea or vomiting.  . rosuvastatin (CRESTOR) 10 MG tablet TAKE 1 TABLET(10 MG) BY MOUTH DAILY  . SYMBICORT 160-4.5 MCG/ACT inhaler INHALE 2 PUFFS INTO THE LUNGS TWICE DAILY  . traZODone (DESYREL) 50 MG tablet Take 0.5-1 tablets (25-50 mg total) by mouth at bedtime as needed for sleep.    Allergies:   Ace inhibitors and Penicillins   Social History   Socioeconomic History  . Marital status: Single    Spouse name: Not on file  . Number of children: 2  . Years of education: Not on file  . Highest education level: Associate degree: academic program  Occupational History  . Occupation: Retired  Tobacco Use  . Smoking status: Never Smoker  . Smokeless tobacco: Never Used  . Tobacco comment: smoking cessation materials not required  Vaping Use  . Vaping Use: Never used  Substance and Sexual Activity  . Alcohol use: No    Alcohol/week: 0.0 standard drinks  . Drug use: No  . Sexual activity: Not Currently    Partners: Male  Other Topics Concern  . Not on file  Social History Narrative   Independent at baseline. Lives alone.    Social Determinants of Health   Financial Resource Strain: Low Risk   . Difficulty of Paying Living Expenses: Not hard at all  Food Insecurity: No Food Insecurity  . Worried About  Charity fundraiser in the Last Year: Never true  . Ran Out of Food in the Last Year: Never true  Transportation Needs: No Transportation Needs  . Lack of Transportation (Medical): No  . Lack of Transportation (Non-Medical): No  Physical Activity: Insufficiently Active  . Days of Exercise per Week: 3 days  . Minutes of Exercise per Session: 30 min  Stress: No Stress Concern Present  . Feeling of Stress : Not at all  Social Connections: Moderately Integrated  . Frequency of Communication with Friends and Family: More than three times a week  . Frequency of Social Gatherings with Friends and Family: More than three times a week  . Attends Religious Services: More than 4 times per year  . Active Member of Clubs or Organizations: Yes  . Attends Archivist Meetings: More than 4 times per year  . Marital Status: Never married     Family History:  The patient's family history includes Aneurysm in her mother;  COPD in her father; Cancer in her brother; Healthy in her brother; Hypertension in her brother and mother; Stroke in her brother.  ROS:   Review of Systems  Constitutional: Negative for chills, diaphoresis, fever, malaise/fatigue and weight loss.  HENT: Negative for congestion.   Eyes: Negative for discharge and redness.  Respiratory: Negative for cough, sputum production, shortness of breath and wheezing.   Cardiovascular: Negative for chest pain, palpitations, orthopnea, claudication, leg swelling and PND.  Gastrointestinal: Negative for abdominal pain, blood in stool, heartburn, melena, nausea and vomiting.  Musculoskeletal: Positive for neck pain. Negative for falls and myalgias.       Right sided posterior neck discomfort  Skin: Negative for rash.  Neurological: Negative for dizziness, tingling, tremors, sensory change, speech change, focal weakness, loss of consciousness and weakness.  Endo/Heme/Allergies: Does not bruise/bleed easily.  Psychiatric/Behavioral: Negative  for substance abuse. The patient is not nervous/anxious.   All other systems reviewed and are negative.    EKGs/Labs/Other Studies Reviewed:    Studies reviewed were summarized above. The additional studies were reviewed today:  2D Echo 12/2018: 1. The left ventricle has normal systolic function, with an ejection fraction of 55-60%. The cavity size was normal. There is mildly increased left ventricular wall thickness. Left ventricular diastolic Doppler parameters are consistent with impaired relaxation. No evidence of left ventricular regional wall motion abnormalities. 2. The right ventricle has normal systolic function. The cavity was normal. There is no increase in right ventricular wall thickness. Right ventricular systolic pressure could not be assessed. 3. Left atrial size was mildly dilated. 4. Right atrial size was mildly dilated. 5. The aortic valve is tricuspid. Mild aortic annular calcification noted. 6. The mitral valve is grossly normal. 7. The aorta is normal in size and structure. 8. The interatrial septum was not well visualized. __________  MPI 12/2018: Pharmacological myocardial perfusion imaging study with no significant ischemia Normal wall motion, EF estimated at 45% GI uptake artifact No EKG changes concerning for ischemia at peak stress or in recovery. Low risk scan   EKG:  EKG is ordered today.  The EKG ordered today demonstrates sinus bradycardia, 48 bpm, no acute ST-T changes  Recent Labs: 02/22/2020: ALT 21; BUN 16; Creat 1.00; Hemoglobin 14.3; Platelets 397; Potassium 4.6; Sodium 139  Recent Lipid Panel    Component Value Date/Time   CHOL 106 02/22/2020 0823   CHOL 98 (L) 01/09/2019 1023   TRIG 52 02/22/2020 0823   HDL 46 (L) 02/22/2020 0823   HDL 39 (L) 01/09/2019 1023   CHOLHDL 2.3 02/22/2020 0823   VLDL 7 01/22/2018 1154   LDLCALC 47 02/22/2020 0823   LDLDIRECT 46 01/09/2019 1023    PHYSICAL EXAM:    VS:  BP (!) 146/80 (BP Location:  Left Arm, Patient Position: Sitting, Cuff Size: Normal)   Pulse (!) 48   Ht 5\' 3"  (1.6 m)   Wt 179 lb 3.2 oz (81.3 kg)   SpO2 97%   BMI 31.74 kg/m   BMI: Body mass index is 31.74 kg/m.  Physical Exam Constitutional:      Appearance: She is well-developed.  HENT:     Head: Normocephalic and atraumatic.  Eyes:     General:        Right eye: No discharge.        Left eye: No discharge.  Neck:     Vascular: No JVD.  Cardiovascular:     Rate and Rhythm: Regular rhythm. Bradycardia present.     Pulses:  No midsystolic click and no opening snap.          Posterior tibial pulses are 2+ on the right side and 2+ on the left side.     Heart sounds: Normal heart sounds, S1 normal and S2 normal. Heart sounds not distant. No murmur heard.  No friction rub.  Pulmonary:     Effort: Pulmonary effort is normal. No respiratory distress.     Breath sounds: Normal breath sounds. No decreased breath sounds, wheezing or rales.  Chest:     Chest wall: No tenderness.  Abdominal:     General: There is no distension.     Palpations: Abdomen is soft.     Tenderness: There is no abdominal tenderness.  Musculoskeletal:     Cervical back: Normal range of motion.  Skin:    General: Skin is warm and dry.     Nails: There is no clubbing.  Neurological:     Mental Status: She is alert and oriented to person, place, and time.  Psychiatric:        Speech: Speech normal.        Behavior: Behavior normal.        Thought Content: Thought content normal.        Judgment: Judgment normal.     Wt Readings from Last 3 Encounters:  03/04/20 179 lb 3.2 oz (81.3 kg)  02/22/20 181 lb 3.2 oz (82.2 kg)  11/23/19 179 lb 8 oz (81.4 kg)     ASSESSMENT & PLAN:   1. PAF: Maintaining sinus rhythm with a bradycardic heart rate with demonstration of appropriate chronotropic response.  No indication of tachybradycardia syndrome or chronotropic incompetence necessitating PPM.  Not requiring AV nodal blocking agent.   Continue as needed short acting diltiazem for tachypalpitations.  She remains on Eliquis in the setting of a CHA2DS2-VASc at least 6 (HTN, age x2, TIA x2, gender).  No symptoms concerning for bleeding.  Recent hemoglobin and renal function normal earlier this month as outlined above.  2. HTN: Blood pressure is mildly elevated in the office this morning though she has not yet taken her amlodipine.  BP well controlled at home.  Continue current dose of amlodipine.  If BP remains elevated she will contact our office.  3. HLD: LDL of 47 from 01/2020 with normal LFT at that time.  She remains on Crestor.  4. TIA: No residual symptoms.  She remains on Eliquis as outlined above given underlying PAF.  Continue Crestor with goal LDL less than 70.  5. Mitral regurgitation: No murmur noted on exam.  Echo from 12/2018 demonstrated trivial mitral regurgitation.  Follow-up periodic echo.  6. Preoperative cardiac risk stratification: She is scheduled to undergo a screening colonoscopy on 04/01/2020.  Revised cardiac index - low risk for noncardiac procedure.  Duke activity status index - greater than 4 METs without cardiac limitation.  She should hold Eliquis for 2 days prior to procedure and resume as soon as safely possible with this to be determined by the GI team.  She may proceed at an overall low risk without any further cardiac testing at this time.  Disposition: F/u with Dr. Fletcher Anon or an APP in 6 months.   Medication Adjustments/Labs and Tests Ordered: Current medicines are reviewed at length with the patient today.  Concerns regarding medicines are outlined above. Medication changes, Labs and Tests ordered today are summarized above and listed in the Patient Instructions accessible in Encounters.   Signed, Christell Faith, PA-C 03/04/2020 8:06  AM     [facility] [address] [place], [place] [address] [phone]

## 2020-02-29 NOTE — Chronic Care Management (AMB) (Signed)
  Chronic Care Management   Note   Name: [name] MRN: [id number] DOB: July 26, 1940    Received call from Ms. Sundstrom. She has started working during the week and will need to adjust a few upcoming appointments. Anticipates being available for her upcoming PCP appointment but will need to complete a telephonic or virtual visit with the Gastroenterology clinic on 02/25/20.   Follow up plan: Will contact the Gastroenterology clinic as requested. Will reschedule next CCM outreach for September.    [name] [facility] Care Management [facility] [phone]

## 2020-03-04 ENCOUNTER — Other Ambulatory Visit: Payer: Self-pay

## 2020-03-04 ENCOUNTER — Encounter: Payer: Self-pay | Admitting: Physician Assistant

## 2020-03-04 ENCOUNTER — Ambulatory Visit (INDEPENDENT_AMBULATORY_CARE_PROVIDER_SITE_OTHER): Payer: Medicare Other | Admitting: Physician Assistant

## 2020-03-04 VITALS — BP 146/80 | HR 48 | Ht 63.0 in | Wt 179.2 lb

## 2020-03-04 DIAGNOSIS — E785 Hyperlipidemia, unspecified: Secondary | ICD-10-CM | POA: Diagnosis not present

## 2020-03-04 DIAGNOSIS — I340 Nonrheumatic mitral (valve) insufficiency: Secondary | ICD-10-CM | POA: Diagnosis not present

## 2020-03-04 DIAGNOSIS — I10 Essential (primary) hypertension: Secondary | ICD-10-CM | POA: Diagnosis not present

## 2020-03-04 DIAGNOSIS — I480 Paroxysmal atrial fibrillation: Secondary | ICD-10-CM

## 2020-03-04 DIAGNOSIS — G459 Transient cerebral ischemic attack, unspecified: Secondary | ICD-10-CM

## 2020-03-04 MED ORDER — APIXABAN 5 MG PO TABS: ORAL_TABLET | ORAL | 6 refills | Status: DC

## 2020-03-04 NOTE — Patient Instructions (Signed)
Medication Instructions:  Eliquis refill sent to pharmacy *If you need a refill on your cardiac medications before your next appointment, please call your pharmacy*   Lab Work: None If you have labs (blood work) drawn today and your tests are completely normal, you will receive your results only by: [name] MyChart Message (if you have MyChart) OR . A paper copy in the mail If you have any lab test that is abnormal or we need to change your treatment, we will call you to review the results.   Testing/Procedures: None   Follow-Up: At [facility], you and your health needs are our priority.  As part of our continuing mission to provide you with exceptional heart care, we have created designated Provider Care Teams.  These Care Teams include your primary Cardiologist (physician) and Advanced Practice Providers (APPs -  Physician Assistants and Nurse Practitioners) who all work together to provide you with the care you need, when you need it.  We recommend signing up for the patient portal called "MyChart".  Sign up information is provided on this After Visit Summary.  MyChart is used to connect with patients for Virtual Visits (Telemedicine).  Patients are able to view lab/test results, encounter notes, upcoming appointments, etc.  Non-urgent messages can be sent to your provider as well.   To learn more about what you can do with MyChart, go to [email or web address].    Your next appointment: 6 Months   The format for your next appointment:   In Person  Provider:    You may see Kathlyn Sacramento, MD or one of the following Advanced Practice Providers on your designated Care Team:    Murray Hodgkins, NP  Christell Faith, PA-C  Marrianne Mood, PA-C    Other Instructions None

## 2020-03-06 ENCOUNTER — Ambulatory Visit: Payer: Self-pay

## 2020-03-06 DIAGNOSIS — Z8673 Personal history of transient ischemic attack (TIA), and cerebral infarction without residual deficits: Secondary | ICD-10-CM

## 2020-03-06 DIAGNOSIS — I10 Essential (primary) hypertension: Secondary | ICD-10-CM

## 2020-03-06 DIAGNOSIS — I480 Paroxysmal atrial fibrillation: Secondary | ICD-10-CM

## 2020-03-06 NOTE — Chronic Care Management (AMB) (Signed)
°  Chronic Care Management   Note  03/06/2020 Name: [name] MRN: [id number] DOB: 1941-05-17    Care Coordination:  [name] requested an updated order for a 30 supply of maintenance medications. Referral submitted for CCM Pharmacist assistance.    Follow up plan: A member of the chronic care management team will follow up with [name] within the next 30 days.     [name] [facility] Care Management [facility] [phone]

## 2020-03-07 ENCOUNTER — Telehealth: Payer: Self-pay | Admitting: *Deleted

## 2020-03-07 NOTE — Chronic Care Management (AMB) (Signed)
  Chronic Care Management   Note  03/07/2020 Name: STEPHAINE BRESHEARS MRN: [id number] DOB: 10-Apr-1941  [name] is a 79 y.o. year old female who is a primary care patient of Steele Sizer, MD. LARAYA PESTKA is currently enrolled in care management services. An additional referral for Pharmacy was placed.   Follow up plan: Unsuccessful telephone outreach attempt made. A HIPPA compliant phone message was left for the patient providing contact information and requesting a return call.  The care management team will reach out to the patient again over the next 7 days.  If patient returns call to provider office, please advise to call [place] at [phone].  [place] Management

## 2020-03-07 NOTE — Chronic Care Management (AMB) (Signed)
  Chronic Care Management   Note  03/07/2020 Name: KEYSI OELKERS MRN: [id number] DOB: April 16, 1941  [name] is a 79 y.o. year old female who is a primary care patient of Steele Sizer, MD and is actively engaged with the care management team. I reached out to [name] by phone today to assist with scheduling an initial visit with the Pharmacist  Follow up plan: Telephone appointment with care management team member scheduled for:04/02/2020  [place] Management

## 2020-03-13 ENCOUNTER — Ambulatory Visit: Payer: Self-pay

## 2020-03-13 NOTE — Chronic Care Management (AMB) (Signed)
  Chronic Care Management   Note  03/13/2020 Name: [name] MRN: [id number] DOB: [birth year]    Call received from [name] regarding new onset of intermittent pain to her right leg. Describes the pain as an "ache" that is noticeable mostly behind her knee. Denies increased swelling to her leg. She does report continued swelling to her right ankle which was discussed during her clinic visit on 02/22/20.  No redness, discoloration or increased warmth to the extremity. Denies need for urgent evaluation but wanted to make Dr. Ancil Boozer aware.   Follow up plan: Will update PCP and follow-up with recommendations.    [name] [facility] Care Management [facility] [phone]

## 2020-03-17 ENCOUNTER — Other Ambulatory Visit: Payer: Self-pay

## 2020-03-17 ENCOUNTER — Ambulatory Visit
Admission: RE | Admit: 2020-03-17 | Discharge: 2020-03-17 | Disposition: A | Discharge status: Home / Self Care | Payer: Medicare Other | Source: Ambulatory Visit | Attending: Family Medicine | Admitting: Family Medicine

## 2020-03-17 DIAGNOSIS — Z1231 Encounter for screening mammogram for malignant neoplasm of breast: Secondary | ICD-10-CM | POA: Insufficient documentation

## 2020-03-26 ENCOUNTER — Telehealth: Payer: Self-pay | Admitting: Physician Assistant

## 2020-03-26 NOTE — Telephone Encounter (Signed)
Pt c/o medication issue:  1. Name of Medication: eliquis   2. How are you currently taking this medication (dosage and times per day)?  5 mg po BID  3. Are you having a reaction (difficulty breathing--STAT)? No    4. What is your medication issue?  Patient cant remember how many days to hold prior to surgery

## 2020-03-26 NOTE — Telephone Encounter (Signed)
Patient verbalized understanding of the instructions below. She was appreciative.  From office visit on 03/04/20 with Christell Faith, PA-C: 6. Preoperative cardiac risk stratification: She is scheduled to undergo a screening colonoscopy on 04/01/2020.  Revised cardiac index - low risk for noncardiac procedure.  Duke activity status index - greater than 4 METs without cardiac limitation.  She should hold Eliquis for 2 days prior to procedure and resume as soon as safely possible with this to be determined by the GI team.  She may proceed at an overall low risk without any further cardiac testing at this time.

## 2020-03-28 ENCOUNTER — Other Ambulatory Visit
Admission: RE | Admit: 2020-03-28 | Discharge: 2020-03-28 | Disposition: A | Discharge status: Home / Self Care | Payer: Medicare Other | Source: Ambulatory Visit | Attending: Gastroenterology | Admitting: Gastroenterology

## 2020-03-28 ENCOUNTER — Other Ambulatory Visit: Payer: Self-pay

## 2020-03-28 DIAGNOSIS — Z20822 Contact with and (suspected) exposure to covid-19: Secondary | ICD-10-CM | POA: Insufficient documentation

## 2020-03-28 DIAGNOSIS — Z01812 Encounter for preprocedural laboratory examination: Secondary | ICD-10-CM | POA: Diagnosis present

## 2020-03-29 LAB — SARS CORONAVIRUS 2 (TAT 6-24 HRS): SARS Coronavirus 2: NEGATIVE

## 2020-04-01 ENCOUNTER — Other Ambulatory Visit: Payer: Self-pay

## 2020-04-01 ENCOUNTER — Encounter: Payer: Self-pay | Admitting: Gastroenterology

## 2020-04-01 ENCOUNTER — Ambulatory Visit
Admission: RE | Admit: 2020-04-01 | Discharge: 2020-04-01 | Disposition: A | Discharge status: Home / Self Care | Payer: Medicare Other | Attending: Gastroenterology | Admitting: Gastroenterology

## 2020-04-01 ENCOUNTER — Ambulatory Visit: Payer: Medicare Other | Admitting: Registered Nurse

## 2020-04-01 ENCOUNTER — Encounter
Admission: RE | Disposition: A | Discharge status: Home / Self Care | Payer: Self-pay | Source: Home / Self Care | Attending: Gastroenterology

## 2020-04-01 DIAGNOSIS — I10 Essential (primary) hypertension: Secondary | ICD-10-CM | POA: Diagnosis not present

## 2020-04-01 DIAGNOSIS — Z880 Allergy status to penicillin: Secondary | ICD-10-CM | POA: Diagnosis not present

## 2020-04-01 DIAGNOSIS — Z888 Allergy status to other drugs, medicaments and biological substances status: Secondary | ICD-10-CM | POA: Diagnosis not present

## 2020-04-01 DIAGNOSIS — Z7901 Long term (current) use of anticoagulants: Secondary | ICD-10-CM | POA: Insufficient documentation

## 2020-04-01 DIAGNOSIS — K5730 Diverticulosis of large intestine without perforation or abscess without bleeding: Secondary | ICD-10-CM | POA: Diagnosis not present

## 2020-04-01 DIAGNOSIS — Z825 Family history of asthma and other chronic lower respiratory diseases: Secondary | ICD-10-CM | POA: Insufficient documentation

## 2020-04-01 DIAGNOSIS — D123 Benign neoplasm of transverse colon: Secondary | ICD-10-CM | POA: Insufficient documentation

## 2020-04-01 DIAGNOSIS — Z860100 Personal history of colon polyps, unspecified: Secondary | ICD-10-CM

## 2020-04-01 DIAGNOSIS — Z800 Family history of malignant neoplasm of digestive organs: Secondary | ICD-10-CM | POA: Diagnosis not present

## 2020-04-01 DIAGNOSIS — Z7951 Long term (current) use of inhaled steroids: Secondary | ICD-10-CM | POA: Diagnosis not present

## 2020-04-01 DIAGNOSIS — Z90710 Acquired absence of both cervix and uterus: Secondary | ICD-10-CM | POA: Insufficient documentation

## 2020-04-01 DIAGNOSIS — Z79899 Other long term (current) drug therapy: Secondary | ICD-10-CM | POA: Insufficient documentation

## 2020-04-01 DIAGNOSIS — Z8673 Personal history of transient ischemic attack (TIA), and cerebral infarction without residual deficits: Secondary | ICD-10-CM | POA: Insufficient documentation

## 2020-04-01 DIAGNOSIS — K648 Other hemorrhoids: Secondary | ICD-10-CM | POA: Insufficient documentation

## 2020-04-01 DIAGNOSIS — Z1211 Encounter for screening for malignant neoplasm of colon: Secondary | ICD-10-CM | POA: Diagnosis present

## 2020-04-01 DIAGNOSIS — K635 Polyp of colon: Secondary | ICD-10-CM

## 2020-04-01 DIAGNOSIS — I480 Paroxysmal atrial fibrillation: Secondary | ICD-10-CM | POA: Diagnosis not present

## 2020-04-01 DIAGNOSIS — G4700 Insomnia, unspecified: Secondary | ICD-10-CM | POA: Insufficient documentation

## 2020-04-01 DIAGNOSIS — Z823 Family history of stroke: Secondary | ICD-10-CM | POA: Insufficient documentation

## 2020-04-01 DIAGNOSIS — Z8249 Family history of ischemic heart disease and other diseases of the circulatory system: Secondary | ICD-10-CM | POA: Insufficient documentation

## 2020-04-01 DIAGNOSIS — D126 Benign neoplasm of colon, unspecified: Secondary | ICD-10-CM | POA: Diagnosis not present

## 2020-04-01 DIAGNOSIS — M810 Age-related osteoporosis without current pathological fracture: Secondary | ICD-10-CM | POA: Insufficient documentation

## 2020-04-01 DIAGNOSIS — E7800 Pure hypercholesterolemia, unspecified: Secondary | ICD-10-CM | POA: Insufficient documentation

## 2020-04-01 HISTORY — PX: COLONOSCOPY WITH PROPOFOL: SHX5780

## 2020-04-01 SURGERY — COLONOSCOPY WITH PROPOFOL
Anesthesia: General

## 2020-04-01 MED ORDER — PROPOFOL 10 MG/ML IV BOLUS
INTRAVENOUS | Status: DC | PRN
  Administered 2020-04-01: 50 mg via INTRAVENOUS

## 2020-04-01 MED ORDER — PROPOFOL 500 MG/50ML IV EMUL
INTRAVENOUS | Status: AC
  Filled 2020-04-01: qty 50

## 2020-04-01 MED ORDER — LIDOCAINE HCL (CARDIAC) PF 100 MG/5ML IV SOSY
PREFILLED_SYRINGE | INTRAVENOUS | Status: DC | PRN
  Administered 2020-04-01: 50 mg via INTRAVENOUS

## 2020-04-01 MED ORDER — PROPOFOL 500 MG/50ML IV EMUL
INTRAVENOUS | Status: DC | PRN
  Administered 2020-04-01: 150 ug/kg/min via INTRAVENOUS

## 2020-04-01 MED ORDER — SODIUM CHLORIDE 0.9 % IV SOLN: INTRAVENOUS | Status: DC

## 2020-04-01 NOTE — Anesthesia Preprocedure Evaluation (Signed)
Anesthesia Evaluation  Patient identified by MRN, date of birth, ID band Patient awake    Reviewed: Allergy & Precautions, H&P , NPO status , Patient's Chart, lab work & pertinent test results  History of Anesthesia Complications Negative for: history of anesthetic complications  Airway Mallampati: III  TM Distance: <3 FB Neck ROM: limited    Dental  (+) Chipped   Pulmonary asthma ,    Pulmonary exam normal        Cardiovascular hypertension, (-) angina(-) DOE + dysrhythmias Atrial Fibrillation      Neuro/Psych TIAnegative psych ROS   GI/Hepatic negative GI ROS, Neg liver ROS, neg GERD  ,  Endo/Other  negative endocrine ROS  Renal/GU negative Renal ROS  negative genitourinary   Musculoskeletal   Abdominal   Peds  Hematology negative hematology ROS (+)   Anesthesia Other Findings Past Medical History: No date: History of stress test     Comment:  a. 02/2013 Nl stress test. No date: Hypertension No date: Insomnia, unspecified No date: Mitral regurgitation     Comment:  a. echo 01/2016: nl LV sys fxn, mild MR, w/o pulm htn, nl              atrial size No date: Osteoporosis, unspecified 01/2016: Paroxysmal atrial fibrillation ([place])     Comment:  a. diagnosed 01/2016; b. has been on eliquis, pradaxa,               and xarelto at varying times 2/2 cost of each               medication-->currently on eliquis (11/2016); c. CHADS2VASc              --> 3 (HTN, age x 1, female). No date: Pure hypercholesterolemia No date: TIA (transient ischemic attack) No date: Unspecified asthma(493.90) No date: Vertigo  Past Surgical History: No date: ABDOMINAL HYSTERECTOMY 08/31/2016: CATARACT EXTRACTION; Right 03/04/2015: COLONOSCOPY WITH PROPOFOL; N/A     Comment:  Procedure: COLONOSCOPY WITH PROPOFOL;  Surgeon: Lucilla Lame, MD;  Location: [facility] ENDOSCOPY;  Service: Endoscopy;              Laterality: N/A;  BMI     Body Mass Index: 31.35 kg/m      Reproductive/Obstetrics negative OB ROS                             Anesthesia Physical Anesthesia Plan  ASA: III  Anesthesia Plan: General   Post-op Pain Management:    Induction: Intravenous  PONV Risk Score and Plan: Propofol infusion and TIVA  Airway Management Planned: Natural Airway and Nasal Cannula  Additional Equipment:   Intra-op Plan:   Post-operative Plan:   Informed Consent: I have reviewed the patients History and Physical, chart, labs and discussed the procedure including the risks, benefits and alternatives for the proposed anesthesia with the patient or authorized representative who has indicated his/her understanding and acceptance.     Dental Advisory Given  Plan Discussed with: Anesthesiologist, CRNA and Surgeon  Anesthesia Plan Comments: (Patient consented for risks of anesthesia including but not limited to:  - adverse reactions to medications - risk of intubation if required - damage to eyes, teeth, lips or other oral mucosa - nerve damage due to positioning  - sore throat or hoarseness - Damage to heart, brain, nerves, lungs, other parts of body or  loss of life  Patient voiced understanding.)        Anesthesia Quick Evaluation

## 2020-04-01 NOTE — H&P (Signed)
Lucilla Lame, MD [address] [place], [place] [address] Phone:[phone] Fax : [phone]  Primary Care Physician:  Steele Sizer, MD Primary Gastroenterologist:  Dr. Allen Norris  Pre-Procedure History & Physical: HPI:  [name] is a 79 y.o. female is here for an colonoscopy.   Past Medical History:  Diagnosis Date  . History of stress test    a. 02/2013 Nl stress test.  . Hypertension   . Insomnia, unspecified   . Mitral regurgitation    a. echo 01/2016: nl LV sys fxn, mild MR, w/o pulm htn, nl atrial size  . Osteoporosis, unspecified   . Paroxysmal atrial fibrillation ([place]) 01/2016   a. diagnosed 01/2016; b. has been on eliquis, pradaxa, and xarelto at varying times 2/2 cost of each medication-->currently on eliquis (11/2016); c. CHADS2VASc --> 3 (HTN, age x 1, female).  . Pure hypercholesterolemia   . TIA (transient ischemic attack)   . Unspecified asthma(493.90)   . Vertigo     Past Surgical History:  Procedure Laterality Date  . ABDOMINAL HYSTERECTOMY    . CATARACT EXTRACTION Right 08/31/2016  . COLONOSCOPY WITH PROPOFOL N/A 03/04/2015   Procedure: COLONOSCOPY WITH PROPOFOL;  Surgeon: Lucilla Lame, MD;  Location: [facility] ENDOSCOPY;  Service: Endoscopy;  Laterality: N/A;    Prior to Admission medications   Medication Sig Start Date End Date Taking? Authorizing Provider  amLODipine (NORVASC) 10 MG tablet TAKE 1 TABLET(10 MG) BY MOUTH DAILY 02/24/20  Yes Sowles, Drue Stager, MD  apixaban (ELIQUIS) 5 MG TABS tablet TAKE 1 TABLET(5 MG) BY MOUTH TWICE DAILY 03/04/20  Yes Dunn, Areta Haber, PA-C  diltiazem (CARDIZEM) 30 MG tablet Take 1 tablet (30 mg total) by mouth every 6 (six) hours as needed (for tachycardia/recurrent Afib.). 02/16/16  Yes Theora Gianotti, NP  meclizine (ANTIVERT) 25 MG tablet Take 25 mg by mouth 3 (three) times daily as needed for dizziness.   Yes [provider]  promethazine (PHENERGAN) 12.5 MG tablet Take 1 tablet (12.5 mg total)  by mouth every 8 (eight) hours as needed for nausea or vomiting. 05/31/19  Yes Sowles, Drue Stager, MD  rosuvastatin (CRESTOR) 10 MG tablet TAKE 1 TABLET(10 MG) BY MOUTH DAILY 02/24/20  Yes Sowles, Drue Stager, MD  SYMBICORT 160-4.5 MCG/ACT inhaler INHALE 2 PUFFS INTO THE LUNGS TWICE DAILY 11/02/18  Yes Ancil Boozer, Drue Stager, MD  traZODone (DESYREL) 50 MG tablet Take 0.5-1 tablets (25-50 mg total) by mouth at bedtime as needed for sleep. 11/23/19  Yes Sowles, Drue Stager, MD  Omega-3 Fatty Acids (FISH OIL PO) Take by mouth. Patient not taking: Reported on 04/01/2020 07/13/19   [provider]    Allergies as of 02/26/2020 - Review Complete 02/25/2020  Allergen Reaction Noted  . Ace inhibitors Swelling 02/15/2013  . Penicillins Anxiety 02/15/2013    Family History  Problem Relation Age of Onset  . COPD Father   . Hypertension Mother   . Aneurysm Mother   . Hypertension Brother   . Stroke Brother   . Cancer Brother        pancreatic  . Healthy Brother     Social History   Socioeconomic History  . Marital status: Single    Spouse name: Not on file  . Number of children: 2  . Years of education: Not on file  . Highest education level: Associate degree: academic program  Occupational History  . Occupation: Retired  Tobacco Use  . Smoking status: Never Smoker  . Smokeless tobacco: Never Used  . Tobacco comment:  smoking cessation materials not required  Vaping Use  . Vaping Use: Never used  Substance and Sexual Activity  . Alcohol use: No    Alcohol/week: 0.0 standard drinks  . Drug use: No  . Sexual activity: Not Currently    Partners: Male  Other Topics Concern  . Not on file  Social History Narrative   Independent at baseline. Lives alone.    Social Determinants of Health   Financial Resource Strain: Low Risk   . Difficulty of Paying Living Expenses: Not hard at all  Food Insecurity: No Food Insecurity  . Worried About Charity fundraiser in the Last Year: Never true  . Ran Out  of Food in the Last Year: Never true  Transportation Needs: No Transportation Needs  . Lack of Transportation (Medical): No  . Lack of Transportation (Non-Medical): No  Physical Activity: Insufficiently Active  . Days of Exercise per Week: 3 days  . Minutes of Exercise per Session: 30 min  Stress: No Stress Concern Present  . Feeling of Stress : Not at all  Social Connections: Moderately Integrated  . Frequency of Communication with Friends and Family: More than three times a week  . Frequency of Social Gatherings with Friends and Family: More than three times a week  . Attends Religious Services: More than 4 times per year  . Active Member of Clubs or Organizations: Yes  . Attends Archivist Meetings: More than 4 times per year  . Marital Status: Never married  Intimate Partner Violence: Not At Risk  . Fear of Current or Ex-Partner: No  . Emotionally Abused: No  . Physically Abused: No  . Sexually Abused: No    Review of Systems: See HPI, otherwise negative ROS  Physical Exam: BP (!) 142/76   Pulse (!) 52   Temp 97.7 F (36.5 C) (Oral)   Resp 18   Ht 5\' 3"  (1.6 m)   Wt 80.3 kg   SpO2 99%   BMI 31.35 kg/m  General:   Alert,  pleasant and cooperative in NAD Head:  Normocephalic and atraumatic. Neck:  Supple; no masses or thyromegaly. Lungs:  Clear throughout to auscultation.    Heart:  Regular rate and rhythm. Abdomen:  Soft, nontender and nondistended. Normal bowel sounds, without guarding, and without rebound.   Neurologic:  Alert and  oriented x4;  grossly normal neurologically.  Impression/Plan: [name] is here for an colonoscopy to be performed for a history of adenomatous polyps on 02/2015  Risks, benefits, limitations, and alternatives regarding  colonoscopy have been reviewed with the patient.  Questions have been answered.  All parties agreeable.   Lucilla Lame, MD  04/01/2020, 7:26 AM

## 2020-04-01 NOTE — Op Note (Signed)
[facility] Patient Name: Relda Agosto Procedure Date: 04/01/2020 7:33 AM MRN: [id number] Account #: [id number] Date of Birth: 1941/04/25 Admit Type: Outpatient Age: 79 Room: [facility] ENDO ROOM 4 Gender: Female Note Status: Finalized Procedure:             Colonoscopy Indications:           High risk colon cancer surveillance: Personal history                         of colonic polyps Providers:             Lucilla Lame MD, MD Referring MD:          Bethena Roys. Sowles, MD (Referring MD) Medicines:             Propofol per Anesthesia Complications:         No immediate complications. Procedure:             Pre-Anesthesia Assessment:                        - Prior to the procedure, a History and Physical was                         performed, and patient medications and allergies were                         reviewed. The patient's tolerance of previous                         anesthesia was also reviewed. The risks and benefits                         of the procedure and the sedation options and risks                         were discussed with the patient. All questions were                         answered, and informed consent was obtained. Prior                         Anticoagulants: The patient has taken no previous                         anticoagulant or antiplatelet agents. ASA Grade                         Assessment: II - A patient with mild systemic disease.                         After reviewing the risks and benefits, the patient                         was deemed in satisfactory condition to undergo the                         procedure.  After obtaining informed consent, the colonoscope was                         passed under direct vision. Throughout the procedure,                         the patient's blood pressure, pulse, and oxygen                         saturations were monitored continuously. The                          Colonoscope was introduced through the anus and                         advanced to the the cecum, identified by appendiceal                         orifice and ileocecal valve. The colonoscopy was                         performed without difficulty. The patient tolerated                         the procedure well. The quality of the bowel                         preparation was excellent. Findings:      The perianal and digital rectal examinations were normal.      A 3 mm polyp was found in the transverse colon. The polyp was sessile.       The polyp was removed with a cold biopsy forceps. Resection and       retrieval were complete.      Multiple small-mouthed diverticula were found in the sigmoid colon.      Non-bleeding internal hemorrhoids were found during retroflexion. The       hemorrhoids were Grade I (internal hemorrhoids that do not prolapse). Impression:            - One 3 mm polyp in the transverse colon, removed with                         a cold biopsy forceps. Resected and retrieved.                        - Diverticulosis in the sigmoid colon.                        - Non-bleeding internal hemorrhoids. Recommendation:        - Discharge patient to home.                        - Resume previous diet.                        - Continue present medications.                        - Await pathology results. Procedure Code(s):     --- Professional ---  45380, Colonoscopy, flexible; with biopsy, single or                         multiple Diagnosis Code(s):     --- Professional ---                        Z86.010, Personal history of colonic polyps                        K63.5, Polyp of colon CPT copyright 2019 American Medical Association. All rights reserved. The codes documented in this report are preliminary and upon coder review may  be revised to meet current compliance requirements. Lucilla Lame MD, MD 04/01/2020 7:58:52 AM This  report has been signed electronically. Number of Addenda: 0 Note Initiated On: 04/01/2020 7:33 AM Scope Withdrawal Time: 0 hours 6 minutes 21 seconds  Total Procedure Duration: 0 hours 13 minutes 35 seconds  Estimated Blood Loss:  Estimated blood loss: none.      [facility]

## 2020-04-01 NOTE — Transfer of Care (Signed)
Immediate Anesthesia Transfer of Care Note  Patient: [name]  Procedure(s) Performed: COLONOSCOPY WITH PROPOFOL (N/A )  Patient Location: PACU and Endoscopy Unit  Anesthesia Type:General  Level of Consciousness: awake, oriented, drowsy and patient cooperative  Airway & Oxygen Therapy: Patient Spontanous Breathing and Patient connected to nasal cannula oxygen  Post-op Assessment: Report given to RN and Post -op Vital signs reviewed and stable  Post vital signs: Reviewed and stable  Last Vitals:  Vitals Value Taken Time  BP 105/78 04/01/20 0804  Temp 36.4 C 04/01/20 0801  Pulse 66 04/01/20 0804  Resp 25 04/01/20 0804  SpO2 95 % 04/01/20 0804    Last Pain:  Vitals:   04/01/20 0801  TempSrc: Temporal  PainSc: Asleep         Complications: No complications documented.

## 2020-04-01 NOTE — Anesthesia Postprocedure Evaluation (Signed)
Anesthesia Post Note  Patient: SWAN FAIRFAX  Procedure(s) Performed: COLONOSCOPY WITH PROPOFOL (N/A )  Patient location during evaluation: Endoscopy Anesthesia Type: General Level of consciousness: awake and alert Pain management: pain level controlled Vital Signs Assessment: post-procedure vital signs reviewed and stable Respiratory status: spontaneous breathing, nonlabored ventilation, respiratory function stable and patient connected to nasal cannula oxygen Cardiovascular status: blood pressure returned to baseline and stable Postop Assessment: no apparent nausea or vomiting Anesthetic complications: no   No complications documented.   Last Vitals:  Vitals:   04/01/20 0821 04/01/20 0831  BP:  115/67  Pulse:    Resp: 17 20  Temp:    SpO2:      Last Pain:  Vitals:   04/01/20 0831  TempSrc:   PainSc: 0-No pain                 Precious Haws Corneisha Alvi

## 2020-04-02 ENCOUNTER — Ambulatory Visit: Payer: Self-pay

## 2020-04-02 ENCOUNTER — Ambulatory Visit: Payer: Medicare Other | Admitting: Pharmacist

## 2020-04-02 ENCOUNTER — Encounter: Payer: Self-pay | Admitting: Gastroenterology

## 2020-04-02 ENCOUNTER — Other Ambulatory Visit: Payer: Self-pay

## 2020-04-02 DIAGNOSIS — I480 Paroxysmal atrial fibrillation: Secondary | ICD-10-CM

## 2020-04-02 DIAGNOSIS — E786 Lipoprotein deficiency: Secondary | ICD-10-CM

## 2020-04-02 LAB — SURGICAL PATHOLOGY

## 2020-04-02 NOTE — Chronic Care Management (AMB) (Signed)
  Chronic Care Management   Note  04/02/2020 Name: SOPHINA MITTEN MRN: [id number] DOB: [birth year]    Care Coordination Only:  [facility]  Contacted the [facility] regarding Ms. Windhorst's request for a mailed copy of her colonoscopy report. Colonoscopy was completed on 04/01/20. Staff confirmed that copy of the report can be mailed out later this week.    [name] Care Management [facility] [phone]

## 2020-04-02 NOTE — Chronic Care Management (AMB) (Signed)
Chronic Care Management Pharmacy  Name: PAHOLA DIMMITT  MRN: [id number] DOB: [birth year]  Chief Complaint/ HPI  [name],  79 y.o. , female presents for their Initial CCM visit with the clinical pharmacist via telephone due to COVID-19 Pandemic.  PCP : Steele Sizer, MD  Their chronic conditions include: HTN, HLD, Afib, Vertigo  Office Visits: 8/27 Afib, Sowles, BP 110/74 P 72 Wt 181, A1c 5.9%, Afib 2017, Eliquis, Cardizem, hx TIA, Symbicort 4 times qwk, can't tolerate CPAP  Consult Visit: NA  Medications: Outpatient Encounter Medications as of 04/02/2020  Medication Sig   amLODipine (NORVASC) 10 MG tablet TAKE 1 TABLET(10 MG) BY MOUTH DAILY   apixaban (ELIQUIS) 5 MG TABS tablet TAKE 1 TABLET(5 MG) BY MOUTH TWICE DAILY   diltiazem (CARDIZEM) 30 MG tablet Take 1 tablet (30 mg total) by mouth every 6 (six) hours as needed (for tachycardia/recurrent Afib.).   meclizine (ANTIVERT) 25 MG tablet Take 25 mg by mouth 3 (three) times daily as needed for dizziness.   Omega-3 Fatty Acids (FISH OIL PO) Take by mouth. (Patient not taking: Reported on 04/01/2020)   promethazine (PHENERGAN) 12.5 MG tablet Take 1 tablet (12.5 mg total) by mouth every 8 (eight) hours as needed for nausea or vomiting.   rosuvastatin (CRESTOR) 10 MG tablet TAKE 1 TABLET(10 MG) BY MOUTH DAILY   SYMBICORT 160-4.5 MCG/ACT inhaler INHALE 2 PUFFS INTO THE LUNGS TWICE DAILY   traZODone (DESYREL) 50 MG tablet Take 0.5-1 tablets (25-50 mg total) by mouth at bedtime as needed for sleep.   No facility-administered encounter medications on file as of 04/02/2020.     Financial Resource Strain: Low Risk    Difficulty of Paying Living Expenses: Not hard at all    Current Diagnosis/Assessment:  Goals Addressed   None    Hyperlipidemia   LDL goal < 100  Lipid Panel     Component Value Date/Time   CHOL 106 02/22/2020 0823   CHOL 98 (L) 01/09/2019 1023   TRIG 52 02/22/2020 0823   HDL 46 (L)  02/22/2020 0823   HDL 39 (L) 01/09/2019 1023   LDLCALC 47 02/22/2020 0823   LDLDIRECT 46 01/09/2019 1023    Hepatic Function Latest Ref Rng & Units 02/22/2020 01/09/2019 06/12/2018  Total Protein 6.1 - 8.1 g/dL 7.3 7.4 7.5  Albumin 3.7 - 4.7 g/dL - 4.3 -  AST 10 - 35 U/L _0 ALT 6 - 29 U/L _1 Alk Phosphatase 39 - 117 IU/L - 69 -  Total Bilirubin 0.2 - 1.2 mg/dL 0.3 0.3 0.5     The ASCVD Risk score (Esperanza., et al., 2013) failed to calculate for the following reasons:   The patient has a prior MI or stroke diagnosis   Patient has failed these meds in past: NA Patient is currently controlled on the following medications:   Crestor 62m daily  We discussed:  At goal Denies myalgias  Plan  Continue current medications  AFIB   Patient is currently rate controlled. HR 72 BPM  Patient has failed these meds in past: NA Patient is currently controlled on the following medications: Eliquis 51mbid, Cardizem 3072mrn flutter  We discussed:  Cardizem 42m77mn? Palpitations, flutter, rare use (about monthly)  Plan  Continue current medications   Vertigo   Patient has failed these meds in past: NA Patient is currently controlled on the following medications:   Meclizine 25mg40m  Phenergan 12.5mg q74mprn  We discussed:   Meclizine infrequently, phenergan Vertigo requires EMS Vitamin D 10mg daily Trazodone? About monthly, usually takes 1/2  Plan  Continue current medications  Medication Management   [place]? Yes, 2 weeks Pt endorses 100% compliance   We discussed:  Stick with [facility]. Will see if they do pill packs. A1c 6.1%, no dx DM Symbicort 2  - 3 times weekly Calcium 12069m1000 iu, counseled on absorption   Plan  Continue current medication management strategy  Follow up: 3 month phone visit  TeMilus HeightPharmD, [phone]

## 2020-04-08 NOTE — Patient Instructions (Addendum)
Visit Information  Goals Addressed            This Visit's Progress   . Chronic Care Management       CARE PLAN ENTRY (see longitudinal plan of care for additional care plan information)  Current Barriers:  . Chronic Disease Management support, education, and care coordination needs related to Hypertension, Hyperlipidemia, and Atrial Fibrillation   Hypertension BP Readings from Last 3 Encounters:  04/01/20 115/67  03/04/20 (!) 146/80  02/22/20 110/74   . Pharmacist Clinical Goal(s): o Over the next 90 days, patient will work with PharmD and providers to maintain BP goal <140/90 . Current regimen:  o Amlodipine 10mg  daily . Interventions: o None . Patient self care activities - Over the next 90 days, patient will: o Check BP weekly, document, and provide at future appointments o Ensure daily salt intake < 2300 mg/day  Hyperlipidemia Lab Results  Component Value Date/Time   LDLCALC 47 02/22/2020 08:23 AM   LDLDIRECT 46 01/09/2019 10:23 AM   . Pharmacist Clinical Goal(s): o Over the next 90 days, patient will work with PharmD and providers to maintain LDL goal < 100 . Current regimen:  o Crestor 10mg  daily . Interventions: o None . Patient self care activities - Over the next 90 days, patient will: o Continue current lifestyle practices  Atrial Fibrillation . Pharmacist Clinical Goal(s) o Over the next 90 days, patient will work with PharmD and providers to prevent flutter . Current regimen:  o Eliquis 5mg  twice daily o Diltiazem 30mg  as needed for flutter . Interventions: o None . Patient self care activities - Over the next 90 days, patient will: o Continue current, effective practices  Medication management . Pharmacist Clinical Goal(s): o Over the next 90 days, patient will work with PharmD and providers to maintain optimal medication adherence . Current pharmacy: [facility] . Interventions o Comprehensive medication review performed. o Continue current  medication management strategy . Patient self care activities - Over the next 90 days, patient will: o Focus on medication adherence by considering delviery, med synchronization and pill packs from [facility] o Take medications as prescribed o Report any questions or concerns to PharmD and/or provider(s)  Initial goal documentation        Ms. Hopes was given information about Chronic Care Management services today including:  1. CCM service includes personalized support from designated clinical staff supervised by her physician, including individualized plan of care and coordination with other care providers 2. 24/7 contact phone numbers for assistance for urgent and routine care needs. 3. Standard insurance, coinsurance, copays and deductibles apply for chronic care management only during months in which we provide at least 20 minutes of these services. Most insurances cover these services at 100%, however patients may be responsible for any copay, coinsurance and/or deductible if applicable. This service may help you avoid the need for more expensive face-to-face services. 4. Only one practitioner may furnish and bill the service in a calendar month. 5. The patient may stop CCM services at any time (effective at the end of the month) by phone call to the office staff.  Patient agreed to services and verbal consent obtained.   Print copy of patient instructions provided.  Telephone follow up appointment with pharmacy team member scheduled for:  Milus Height, PharmD, [place], [facility] [phone]   Atrial Fibrillation  Atrial fibrillation is a type of heartbeat that is irregular or fast. If you have this condition, your heart beats  without any order. This makes it hard for your heart to pump blood in a normal way. Atrial fibrillation may come and go, or it may become a long-lasting problem. If this condition is not treated, it can put  you at higher risk for stroke, heart failure, and other heart problems. What are the causes? This condition may be caused by diseases that damage the heart. They include:  High blood pressure.  Heart failure.  Heart valve disease.  Heart surgery. Other causes include:  Diabetes.  Thyroid disease.  Being overweight.  Kidney disease. Sometimes the cause is not known. What increases the risk? You are more likely to develop this condition if:  You are older.  You smoke.  You exercise often and very hard.  You have a family history of this condition.  You are a man.  You use drugs.  You drink a lot of alcohol.  You have lung conditions, such as emphysema, pneumonia, or COPD.  You have sleep apnea. What are the signs or symptoms? Common symptoms of this condition include:  A feeling that your heart is beating very fast.  Chest pain or discomfort.  Feeling short of breath.  Suddenly feeling light-headed or weak.  Getting tired easily during activity.  Fainting.  Sweating. In some cases, there are no symptoms. How is this treated? Treatment for this condition depends on underlying conditions and how you feel when you have atrial fibrillation. They include:  Medicines to: ? Prevent blood clots. ? Treat heart rate or heart rhythm problems.  Using devices, such as a pacemaker, to correct heart rhythm problems.  Doing surgery to remove the part of the heart that sends bad signals.  Closing an area where clots can form in the heart (left atrial appendage). In some cases, your doctor will treat other underlying conditions. Follow these instructions at home: Medicines  Take over-the-counter and prescription medicines only as told by your doctor.  Do not take any new medicines without first talking to your doctor.  If you are taking blood thinners: ? Talk with your doctor before you take any medicines that have aspirin or NSAIDs, such as ibuprofen, in  them. ? Take your medicine exactly as told by your doctor. Take it at the same time each day. ? Avoid activities that could hurt or bruise you. Follow instructions about how to prevent falls. ? Wear a bracelet that says you are taking blood thinners. Or, carry a card that lists what medicines you take. Lifestyle      Do not use any products that have nicotine or tobacco in them. These include cigarettes, e-cigarettes, and chewing tobacco. If you need help quitting, ask your doctor.  Eat heart-healthy foods. Talk with your doctor about the right eating plan for you.  Exercise regularly as told by your doctor.  Do not drink alcohol.  Lose weight if you are overweight.  Do not use drugs, including cannabis. General instructions  If you have a condition that causes breathing to stop for a short period of time (apnea), treat it as told by your doctor.  Keep a healthy weight. Do not use diet pills unless your doctor says they are safe for you. Diet pills may make heart problems worse.  Keep all follow-up visits as told by your doctor. This is important. Contact a doctor if:  You notice a change in the speed, rhythm, or strength of your heartbeat.  You are taking a blood-thinning medicine and you get more bruising.  You get tired more easily when you move or exercise.  You have a sudden change in weight. Get help right away if:   You have pain in your chest or your belly (abdomen).  You have trouble breathing.  You have side effects of blood thinners, such as blood in your vomit, poop (stool), or pee (urine), or bleeding that cannot stop.  You have any signs of a stroke. "BE FAST" is an easy way to remember the main warning signs: ? B - Balance. Signs are dizziness, sudden trouble walking, or loss of balance. ? E - Eyes. Signs are trouble seeing or a change in how you see. ? F - Face. Signs are sudden weakness or loss of feeling in the face, or the face or eyelid drooping on  one side. ? A - Arms. Signs are weakness or loss of feeling in an arm. This happens suddenly and usually on one side of the body. ? S - Speech. Signs are sudden trouble speaking, slurred speech, or trouble understanding what people say. ? T - Time. Time to call emergency services. Write down what time symptoms started.  You have other signs of a stroke, such as: ? A sudden, very bad headache with no known cause. ? Feeling like you may vomit (nausea). ? Vomiting. ? A seizure. These symptoms may be an emergency. Do not wait to see if the symptoms will go away. Get medical help right away. Call your local emergency services (911 in the [place] Do not drive yourself to the hospital. Summary  Atrial fibrillation is a type of heartbeat that is irregular or fast.  You are at higher risk of this condition if you smoke, are older, have diabetes, or are overweight.  Follow your doctor's instructions about medicines, diet, exercise, and follow-up visits.  Get help right away if you have signs or symptoms of a stroke.  Get help right away if you cannot catch your breath, or you have chest pain or discomfort. This information is not intended to replace advice given to you by your health care provider. Make sure you discuss any questions you have with your health care provider. Document Revised: 12/06/2018 Document Reviewed: 12/06/2018 Elsevier Patient Education  [place].

## 2020-04-21 ENCOUNTER — Other Ambulatory Visit: Payer: Self-pay

## 2020-04-21 ENCOUNTER — Ambulatory Visit (INDEPENDENT_AMBULATORY_CARE_PROVIDER_SITE_OTHER): Payer: Medicare Other | Admitting: Emergency Medicine

## 2020-04-21 DIAGNOSIS — Z23 Encounter for immunization: Secondary | ICD-10-CM | POA: Diagnosis not present

## 2020-04-22 ENCOUNTER — Telehealth: Payer: Self-pay | Admitting: Pharmacist

## 2020-04-22 NOTE — Progress Notes (Signed)
    Chronic Care Management Pharmacy Assistant   Name: [name]  MRN: [id number] DOB: 1941-01-20  Reason for Encounter: Medication Review  Patient Questions:  1.  Have you seen any other providers since your last visit? No  2.  Any changes in your medicines or health? No   [name],  79 y.o. , female presents for their Follow-Up CCM visit with the clinical pharmacist via telephone.  PCP : Steele Sizer, MD  Allergies:   Allergies  Allergen Reactions  . Ace Inhibitors Swelling    Patient states her tongue swell.  [name] Penicillins Anxiety    Has patient had a PCN reaction causing immediate rash, facial/tongue/throat swelling, SOB or lightheadedness with hypotension: No Has patient had a PCN reaction causing severe rash involving mucus membranes or skin necrosis: No Has patient had a PCN reaction that required hospitalization No Has patient had a PCN reaction occurring within the last 10 years: No If all of the above answers are "NO", then may proceed with Cephalosporin use.    Medications: Outpatient Encounter Medications as of 04/22/2020  Medication Sig  . amLODipine (NORVASC) 10 MG tablet TAKE 1 TABLET(10 MG) BY MOUTH DAILY  . apixaban (ELIQUIS) 5 MG TABS tablet TAKE 1 TABLET(5 MG) BY MOUTH TWICE DAILY  . diltiazem (CARDIZEM) 30 MG tablet Take 1 tablet (30 mg total) by mouth every 6 (six) hours as needed (for tachycardia/recurrent Afib.).  [name] meclizine (ANTIVERT) 25 MG tablet Take 25 mg by mouth 3 (three) times daily as needed for dizziness.  . Omega-3 Fatty Acids (FISH OIL PO) Take by mouth. (Patient not taking: Reported on 04/01/2020)  . promethazine (PHENERGAN) 12.5 MG tablet Take 1 tablet (12.5 mg total) by mouth every 8 (eight) hours as needed for nausea or vomiting.  . rosuvastatin (CRESTOR) 10 MG tablet TAKE 1 TABLET(10 MG) BY MOUTH DAILY  . SYMBICORT 160-4.5 MCG/ACT inhaler INHALE 2 PUFFS INTO THE LUNGS TWICE DAILY  . traZODone (DESYREL) 50 MG tablet Take  0.5-1 tablets (25-50 mg total) by mouth at bedtime as needed for sleep.   No facility-administered encounter medications on file as of 04/22/2020.    Current Diagnosis: Patient Active Problem List   Diagnosis Date Noted  . History of colonic polyps   . Polyp of transverse colon   . Aneurysm ([place]) 02/08/2018  . History of CVA (cerebrovascular accident) without residual deficits 01/24/2018  . 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 TIA (transient ischemic attack) 01/22/2018  . Impingement syndrome of shoulder region 01/02/2018  . History of cataract surgery, right 09/01/2016  . Paroxysmal atrial fibrillation ([place]) 02/24/2016  . Vertigo 02/24/2016  . Low HDL (under 40) 02/17/2016  . Dizziness 02/15/2016  . Hx of colonic polyps   . Benign neoplasm of sigmoid colon   . Asthma, mild intermittent 02/21/2015  . Hyperglycemia 02/21/2015  . Essential hypertension 02/20/2015  . Inconclusive mammogram due to dense breasts 01/02/2015  . Dense breast 01/02/2015  . Abnormal EKG 02/23/2013    Goals Addressed   None     Follow-Up:  Care Coordination with Outside Provider and Coordination of Enhanced Pharmacy Services    Reviewed chart and adherence measures. Per insurance data, patient is 90-99% compliant for statin adherence.

## 2020-05-01 NOTE — Progress Notes (Deleted)
Patient is a 79 year old female patient of Dr. Ancil Boozer Last visit with her was 02/22/2020 Follows up today with knee pain

## 2020-05-02 ENCOUNTER — Ambulatory Visit (INDEPENDENT_AMBULATORY_CARE_PROVIDER_SITE_OTHER): Payer: Medicare Other | Admitting: Family Medicine

## 2020-05-02 ENCOUNTER — Ambulatory Visit: Payer: Medicare Other | Admitting: Internal Medicine

## 2020-05-02 ENCOUNTER — Encounter: Payer: Self-pay | Admitting: Family Medicine

## 2020-05-02 ENCOUNTER — Other Ambulatory Visit: Payer: Self-pay

## 2020-05-02 VITALS — BP 120/70 | HR 91 | Temp 97.8°F | Resp 16 | Ht 63.0 in | Wt 177.9 lb

## 2020-05-02 DIAGNOSIS — M79604 Pain in right leg: Secondary | ICD-10-CM | POA: Diagnosis not present

## 2020-05-02 DIAGNOSIS — M1711 Unilateral primary osteoarthritis, right knee: Secondary | ICD-10-CM | POA: Diagnosis not present

## 2020-05-02 NOTE — Progress Notes (Signed)
Name: [name]   MRN: [id number]    DOB: 30-Jun-1940   Date:05/02/2020       Progress Note  Subjective  Chief Complaint  Chief Complaint  Patient presents with  . Leg Pain    R leg/knee, x1 month    HPI  Right left pain: she noticed pain on right popliteal fossa over one month ago with sudden onset of throbbing sensation and swelling behind her knee, the pain is affecting her sleep , difficulty with bearing weight, no redness or increase in warmth. No pain during palpation, no history of of DVT or gout. Pain improves with Tylenol but she does not like taking it all the time, pain is currently 9/10. She denies any trauma. She is on Eliquis for afib.    Patient Active Problem List   Diagnosis Date Noted  . History of colonic polyps   . Polyp of transverse colon   . Aneurysm ([place]) 02/08/2018  . History of CVA (cerebrovascular accident) without residual deficits 01/24/2018  . 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 TIA (transient ischemic attack) 01/22/2018  . Impingement syndrome of shoulder region 01/02/2018  . History of cataract surgery, right 09/01/2016  . Paroxysmal atrial fibrillation ([place]) 02/24/2016  . Vertigo 02/24/2016  . Low HDL (under 40) 02/17/2016  . Dizziness 02/15/2016  . Hx of colonic polyps   . Benign neoplasm of sigmoid colon   . Asthma, mild intermittent 02/21/2015  . Hyperglycemia 02/21/2015  . Essential hypertension 02/20/2015  . Inconclusive mammogram due to dense breasts 01/02/2015  . Dense breast 01/02/2015  . Abnormal EKG 02/23/2013    Past Surgical History:  Procedure Laterality Date  . ABDOMINAL HYSTERECTOMY    . CATARACT EXTRACTION Right 08/31/2016  . COLONOSCOPY WITH PROPOFOL N/A 03/04/2015   Procedure: COLONOSCOPY WITH PROPOFOL;  Surgeon: Lucilla Lame, MD;  Location: [facility] ENDOSCOPY;  Service: Endoscopy;  Laterality: N/A;  . COLONOSCOPY WITH PROPOFOL N/A 04/01/2020   Procedure: COLONOSCOPY WITH PROPOFOL;  Surgeon: Lucilla Lame, MD;   Location: [facility] ENDOSCOPY;  Service: Endoscopy;  Laterality: N/A;    Family History  Problem Relation Age of Onset  . COPD Father   . Hypertension Mother   . Aneurysm Mother   . Hypertension Brother   . Stroke Brother   . Cancer Brother        pancreatic  . Healthy Brother     Social History   Tobacco Use  . Smoking status: Never Smoker  . Smokeless tobacco: Never Used  . Tobacco comment: smoking cessation materials not required  Substance Use Topics  . Alcohol use: No    Alcohol/week: 0.0 standard drinks     Current Outpatient Medications:  .  amLODipine (NORVASC) 10 MG tablet, TAKE 1 TABLET(10 MG) BY MOUTH DAILY, Disp: 90 tablet, Rfl: 0 .  apixaban (ELIQUIS) 5 MG TABS tablet, TAKE 1 TABLET(5 MG) BY MOUTH TWICE DAILY, Disp: 60 tablet, Rfl: 6 .  diltiazem (CARDIZEM) 30 MG tablet, Take 1 tablet (30 mg total) by mouth every 6 (six) hours as needed (for tachycardia/recurrent Afib.)., Disp: 30 tablet, Rfl: 3 .  meclizine (ANTIVERT) 25 MG tablet, Take 25 mg by mouth 3 (three) times daily as needed for dizziness., Disp: , Rfl:  .  Omega-3 Fatty Acids (FISH OIL PO), Take by mouth. , Disp: , Rfl:  .  promethazine (PHENERGAN) 12.5 MG tablet, Take 1 tablet (12.5 mg total) by mouth every 8 (eight) hours as needed for nausea or vomiting., Disp: 6 tablet, Rfl:  0 .  rosuvastatin (CRESTOR) 10 MG tablet, TAKE 1 TABLET(10 MG) BY MOUTH DAILY, Disp: 90 tablet, Rfl: 1 .  SYMBICORT 160-4.5 MCG/ACT inhaler, INHALE 2 PUFFS INTO THE LUNGS TWICE DAILY, Disp: 10.2 g, Rfl: 0 .  traZODone (DESYREL) 50 MG tablet, Take 0.5-1 tablets (25-50 mg total) by mouth at bedtime as needed for sleep., Disp: 30 tablet, Rfl: 0  Allergies  Allergen Reactions  . Ace Inhibitors Swelling    Patient states her tongue swell.  [name] Penicillins Anxiety    Has patient had a PCN reaction causing immediate rash, facial/tongue/throat swelling, SOB or lightheadedness with hypotension: No Has patient had a PCN reaction causing  severe rash involving mucus membranes or skin necrosis: No Has patient had a PCN reaction that required hospitalization No Has patient had a PCN reaction occurring within the last 10 years: No If all of the above answers are "NO", then may proceed with Cephalosporin use.    I personally reviewed active problem list, medication list, allergies, family history, social history with the patient/caregiver today.   ROS  Ten systems reviewed and is negative except as mentioned in HPI   Objective  Vitals:   05/02/20 1455  BP: 120/70  Pulse: 91  Resp: 16  Temp: 97.8 F (36.6 C)  TempSrc: Oral  SpO2: 99%  Weight: 177 lb 14.4 oz (80.7 kg)  Height: 5\' 3"  (1.6 m)    Body mass index is 31.51 kg/m.  Physical Exam  Constitutional: Patient appears well-developed and well-nourished. Obese  No distress.  HEENT: head atraumatic, normocephalic, pupils equal and reactive to light,  neck supple Cardiovascular: Normal rate, regular rhythm and normal heart sounds.  No murmur heard. right leg is swollen when compared to right side Muscular skeletal: pain during rom of right knee, crepitus with extension of right knee, popliteal fossa fullness, no redness or increase in warmth Pulmonary/Chest: Effort normal and breath sounds normal. No respiratory distress. Abdominal: Soft.  There is no tenderness. Psychiatric: Patient has a normal mood and affect. behavior is normal. Judgment and thought content normal.  Recent Results (from the past 2160 hour(s))  Hepatitis C antibody     Status: None   Collection Time: 02/22/20  8:23 AM  Result Value Ref Range   Hepatitis C Ab NON-REACTIVE NON-REACTI   SIGNAL TO CUT-OFF 0.01 <1.00    Comment: . HCV antibody was non-reactive. There is no laboratory  evidence of HCV infection. . In most cases, no further action is required. However, if recent HCV exposure is suspected, a test for HCV RNA (test code [id number]) is suggested. . For additional information please  refer to http://education.quest[facility].com/faq/FAQ22v1 (This link is being provided for informational/ educational purposes only.) .   COMPLETE METABOLIC PANEL WITH GFR     Status: Abnormal   Collection Time: 02/22/20  8:23 AM  Result Value Ref Range   Glucose, Bld 97 65 - 99 mg/dL    Comment: .            Fasting reference interval .    BUN 16 7 - 25 mg/dL   Creat 1.00 (H) 0.60 - 0.93 mg/dL    Comment: For patients >23 years of age, the reference limit for Creatinine is approximately 13% higher for people identified as African-American. .    GFR, Est Non African American 54 (L) > OR = 60 mL/min/1.3m2   GFR, Est African American 63 > OR = 60 mL/min/1.80m2   BUN/Creatinine Ratio 16 6 - 22 (calc)  Sodium 139 135 - 146 mmol/L   Potassium 4.6 3.5 - 5.3 mmol/L   Chloride 106 98 - 110 mmol/L   CO2 28 20 - 32 mmol/L   Calcium 9.9 8.6 - 10.4 mg/dL   Total Protein 7.3 6.1 - 8.1 g/dL   Albumin 4.2 3.6 - 5.1 g/dL   Globulin 3.1 1.9 - 3.7 g/dL (calc)   AG Ratio 1.4 1.0 - 2.5 (calc)   Total Bilirubin 0.3 0.2 - 1.2 mg/dL   Alkaline phosphatase (APISO) 68 37 - 153 U/L   AST 19 10 - 35 U/L   ALT 21 6 - 29 U/L  CBC with Differential/Platelet     Status: None   Collection Time: 02/22/20  8:23 AM  Result Value Ref Range   WBC 5.6 3.8 - 10.8 Thousand/uL   RBC 4.68 3.80 - 5.10 Million/uL   Hemoglobin 14.3 11.7 - 15.5 g/dL   HCT 43.3 35 - 45 %   MCV 92.5 80.0 - 100.0 fL   MCH 30.6 27.0 - 33.0 pg   MCHC 33.0 32.0 - 36.0 g/dL   RDW 13.0 11.0 - 15.0 %   Platelets 397 140 - 400 Thousand/uL   MPV 10.9 7.5 - 12.5 fL   Neutro Abs 3,304 1,500 - 7,800 cells/uL   Lymphs Abs 1,568 850 - 3,900 cells/uL   Absolute Monocytes 588 200 - 950 cells/uL   Eosinophils Absolute 112 15.0 - 500.0 cells/uL   Basophils Absolute 28 0.0 - 200.0 cells/uL   Neutrophils Relative % 59 %   Total Lymphocyte 28.0 %   Monocytes Relative 10.5 %   Eosinophils Relative 2.0 %   Basophils Relative 0.5 %  Lipid  panel     Status: Abnormal   Collection Time: 02/22/20  8:23 AM  Result Value Ref Range   Cholesterol 106 <200 mg/dL   HDL 46 (L) > OR = 50 mg/dL   Triglycerides 52 <150 mg/dL   LDL Cholesterol (Calc) 47 mg/dL (calc)    Comment: Reference range: <100 . Desirable range <100 mg/dL for primary prevention;   <70 mg/dL for patients with CHD or diabetic patients  with > or = 2 CHD risk factors. [name] LDL-C is now calculated using the Martin-Hopkins  calculation, which is a validated novel method providing  better accuracy than the Friedewald equation in the  estimation of LDL-C.  Cresenciano Genre et al. Annamaria Helling. 9798;921(19): 2061-2068  (http://education.[facility].com/faq/FAQ164)    Total CHOL/HDL Ratio 2.3 <5.0 (calc)   Non-HDL Cholesterol (Calc) 60 <130 mg/dL (calc)    Comment: For patients with diabetes plus 1 major ASCVD risk  factor, treating to a non-HDL-C goal of <100 mg/dL  (LDL-C of <70 mg/dL) is considered a therapeutic  option.   Hemoglobin A1c     Status: Abnormal   Collection Time: 02/22/20  8:23 AM  Result Value Ref Range   Hgb A1c MFr Bld 6.1 (H) <5.7 % of total Hgb    Comment: For someone without known diabetes, a hemoglobin  A1c value between 5.7% and 6.4% is consistent with prediabetes and should be confirmed with a  follow-up test. . For someone with known diabetes, a value <7% indicates that their diabetes is well controlled. A1c targets should be individualized based on duration of diabetes, age, comorbid conditions, and other considerations. . This assay result is consistent with an increased risk of diabetes. . Currently, no consensus exists regarding use of hemoglobin A1c for diagnosis of diabetes for children. .    Mean Plasma  Glucose 128 (calc)   eAG (mmol/L) 7.1 (calc)  VITAMIN D 25 Hydroxy (Vit-D Deficiency, Fractures)     Status: Abnormal   Collection Time: 02/22/20  8:23 AM  Result Value Ref Range   Vit D, 25-Hydroxy 20 (L) 30 - 100 ng/mL     Comment: Vitamin D Status         25-OH Vitamin D: . Deficiency:                    <20 ng/mL Insufficiency:             20 - 29 ng/mL Optimal:                 > or = 30 ng/mL . For 25-OH Vitamin D testing on patients on  D2-supplementation and patients for whom quantitation  of D2 and D3 fractions is required, the QuestAssureD(TM) 25-OH VIT D, (D2,D3), LC/MS/MS is recommended: order  code [phone] (patients >37yrs). See Note 1 . Note 1 . For additional information, please refer to  http://education.[facility].com/faq/FAQ199  (This link is being provided for informational/ educational purposes only.)   SARS CORONAVIRUS 2 (TAT 6-24 HRS) Nasopharyngeal Nasopharyngeal Swab     Status: None   Collection Time: 03/28/20  8:51 AM   Specimen: Nasopharyngeal Swab  Result Value Ref Range   SARS Coronavirus 2 NEGATIVE NEGATIVE    Comment: (NOTE) SARS-CoV-2 target nucleic acids are NOT DETECTED.  The SARS-CoV-2 RNA is generally detectable in upper and lower respiratory specimens during the acute phase of infection. Negative results do not preclude SARS-CoV-2 infection, do not rule out co-infections with other pathogens, and should not be used as the sole basis for treatment or other patient management decisions. Negative results must be combined with clinical observations, patient history, and epidemiological information. The expected result is Negative.  Fact Sheet for Patients: SugarRoll.be  Fact Sheet for Healthcare Providers: https://www.woods-mathews.com/  This test is not yet approved or cleared by the [place] FDA and  has been authorized for detection and/or diagnosis of SARS-CoV-2 by FDA under an Emergency Use Authorization (EUA). This EUA will remain  in effect (meaning this test can be used) for the duration of the COVID-19 declaration under Se ction 564(b)(1) of the Act, 21 U.S.C. section 360bbb-3(b)(1), unless the  authorization is terminated or revoked sooner.  Performed at [facility], [place] [address], [place], [place] [address]   Surgical pathology     Status: None   Collection Time: 04/01/20  7:52 AM  Result Value Ref Range   SURGICAL PATHOLOGY      SURGICAL PATHOLOGY CASE: [id number] PATIENT: Ilean China Surgical Pathology Report     Specimen Submitted: A. Colon polyp, transverse; cbx  Clinical History: Personal history of colon polyps.  Transverse colon polyp.      DIAGNOSIS: A. COLON POLYP, TRANSVERSE; COLD BIOPSY: - TUBULAR ADENOMA. - NEGATIVE FOR HIGH-GRADE DYSPLASIA AND MALIGNANCY.  GROSS DESCRIPTION: A. Labeled: Transverse colon polyp cbx Received: Formalin Tissue fragment(s): 2 Size: Range from 0.2-0.3 cm Description: Tan soft tissue fragments Entirely submitted in 1 cassette.     Final Diagnosis performed by Quay Burow, MD.   Electronically signed 04/02/2020 9:29:10AM The electronic signature indicates that the named Attending Pathologist has evaluated the specimen Technical component performed at [facility], [address], [place], [place] [address] Lab: [phone] Dir: Rush Farmer, MD, MMM  Professional component performed at [facility], [facility], [place], [place], [place] [address] Lab: [phone] Dir:  Dellia Nims Rubinas, MD      PHQ2/9: Depression screen [facility] 2/9 05/02/2020 02/22/2020 01/31/2020 11/23/2019 08/31/2019  Decreased Interest 0 0 0 0 0  Down, Depressed, Hopeless 0 0 0 0 0  PHQ - 2 Score 0 0 0 0 0  Altered sleeping - 0 - 0 0  Tired, decreased energy - 0 - 0 0  Change in appetite - 0 - 0 0  Feeling bad or failure about yourself  - 0 - 0 0  Trouble concentrating - 0 - 0 0  Moving slowly or fidgety/restless - 0 - 0 0  Suicidal thoughts - 0 - 0 0  PHQ-9 Score - 0 - 0 0  Difficult doing work/chores - - - - Not difficult at all  Some recent data might be hidden    phq 9 is negative   Fall  Risk: Fall Risk  05/02/2020 02/22/2020 01/31/2020 11/23/2019 08/31/2019  Falls in the past year? 0 0 0 0 -  Number falls in past yr: 0 0 0 0 -  Injury with Fall? 0 0 0 0 -  Risk for fall due to : - - No Fall Risks - -  Risk for fall due to: Comment - - - - -  Follow up - - Falls prevention discussed - Falls evaluation completed    Functional Status Survey: Is the patient deaf or have difficulty hearing?: No Does the patient have difficulty seeing, even when wearing glasses/contacts?: No Does the patient have difficulty concentrating, remembering, or making decisions?: No Does the patient have difficulty walking or climbing stairs?: Yes Does the patient have difficulty dressing or bathing?: No Does the patient have difficulty doing errands alone such as visiting a doctor's office or shopping?: No    Assessment & Plan  1. Pain in right leg  - Ambulatory referral to Orthopedic Surgery  Unlikely to be DVT since on Eliquis and pain is more on knee than lower leg, no pain during compression of calf

## 2020-05-05 ENCOUNTER — Emergency Department
Admission: EM | Admit: 2020-05-05 | Discharge: 2020-05-05 | Disposition: A | Discharge status: Home / Self Care | Payer: Medicare Other | Attending: Emergency Medicine | Admitting: Emergency Medicine

## 2020-05-05 ENCOUNTER — Other Ambulatory Visit: Payer: Self-pay

## 2020-05-05 ENCOUNTER — Emergency Department: Payer: Medicare Other

## 2020-05-05 DIAGNOSIS — M1711 Unilateral primary osteoarthritis, right knee: Secondary | ICD-10-CM | POA: Insufficient documentation

## 2020-05-05 DIAGNOSIS — Z79899 Other long term (current) drug therapy: Secondary | ICD-10-CM | POA: Insufficient documentation

## 2020-05-05 DIAGNOSIS — M25561 Pain in right knee: Secondary | ICD-10-CM | POA: Insufficient documentation

## 2020-05-05 DIAGNOSIS — Z7901 Long term (current) use of anticoagulants: Secondary | ICD-10-CM | POA: Diagnosis not present

## 2020-05-05 DIAGNOSIS — G8929 Other chronic pain: Secondary | ICD-10-CM | POA: Diagnosis not present

## 2020-05-05 DIAGNOSIS — J45909 Unspecified asthma, uncomplicated: Secondary | ICD-10-CM | POA: Insufficient documentation

## 2020-05-05 DIAGNOSIS — I10 Essential (primary) hypertension: Secondary | ICD-10-CM | POA: Diagnosis not present

## 2020-05-05 DIAGNOSIS — Z7951 Long term (current) use of inhaled steroids: Secondary | ICD-10-CM | POA: Diagnosis not present

## 2020-05-05 DIAGNOSIS — Z85038 Personal history of other malignant neoplasm of large intestine: Secondary | ICD-10-CM | POA: Insufficient documentation

## 2020-05-05 DIAGNOSIS — Y990 Civilian activity done for income or pay: Secondary | ICD-10-CM | POA: Insufficient documentation

## 2020-05-05 DIAGNOSIS — X501XXA Overexertion from prolonged static or awkward postures, initial encounter: Secondary | ICD-10-CM | POA: Insufficient documentation

## 2020-05-05 MED ORDER — ONDANSETRON 4 MG PO TBDP
4.0000 mg | ORAL_TABLET | Freq: Once | ORAL | Status: AC
  Administered 2020-05-05: 4 mg via ORAL
  Filled 2020-05-05: qty 1

## 2020-05-05 MED ORDER — TRAMADOL HCL 50 MG PO TABS: 50.0000 mg | ORAL_TABLET | Freq: Four times a day (QID) | ORAL | 0 refills | Status: AC | PRN

## 2020-05-05 MED ORDER — ONDANSETRON 4 MG PO TBDP: 4.0000 mg | ORAL_TABLET | Freq: Three times a day (TID) | ORAL | 0 refills | Status: AC | PRN

## 2020-05-05 MED ORDER — TRAMADOL HCL 50 MG PO TABS
50.0000 mg | ORAL_TABLET | Freq: Once | ORAL | Status: AC
  Administered 2020-05-05: 50 mg via ORAL
  Filled 2020-05-05: qty 1

## 2020-05-05 NOTE — Discharge Instructions (Signed)
Take tramadol and Zofran for pain. Please make follow-up appointment with orthopedist, Dr. Posey Pronto.

## 2020-05-05 NOTE — ED Notes (Signed)
Pt has right knee pain.  Pt had steroid shot last week to right knee.  Pain for several months with it worsening during the last few days.  Pt was seen  By emerg ortho today and sent to er for eval for possible mri.  Pt alert.  No known injury to right knee.

## 2020-05-05 NOTE — ED Provider Notes (Signed)
Emergency Department Provider Note  ____________________________________________  Time seen: Approximately 3:54 PM  I have reviewed the triage vital signs and the nursing notes.   HISTORY  Chief Complaint Knee Pain   Historian Patient     HPI [name] is a 79 y.o. female presents to the emergency department with acute on chronic right knee pain for the past 2 months.  Patient went to Ortho urgent care on Friday and had a cortisone injection.  She reports that her pain improved for approximately 1 day.  She states that she went to sit down at work and felt like her knee gave out on her.  She states that she has had difficulty bearing weight for the past 2 months.  She states that she was referred from urgent care for a MRI in the absence of new falls or traumas.  She denies chest pain, chest tightness or abdominal pain.  No other alleviating measures have been attempted.   Past Medical History:  Diagnosis Date  . History of stress test    a. 02/2013 Nl stress test.  . Hypertension   . Insomnia, unspecified   . Mitral regurgitation    a. echo 01/2016: nl LV sys fxn, mild MR, w/o pulm htn, nl atrial size  . Osteoporosis, unspecified   . Paroxysmal atrial fibrillation ([place]) 01/2016   a. diagnosed 01/2016; b. has been on eliquis, pradaxa, and xarelto at varying times 2/2 cost of each medication-->currently on eliquis (11/2016); c. CHADS2VASc --> 3 (HTN, age x 1, female).  . Pure hypercholesterolemia   . TIA (transient ischemic attack)   . Unspecified asthma(493.90)   . Vertigo      Immunizations up to date:  Yes.     Past Medical History:  Diagnosis Date  . History of stress test    a. 02/2013 Nl stress test.  . Hypertension   . Insomnia, unspecified   . Mitral regurgitation    a. echo 01/2016: nl LV sys fxn, mild MR, w/o pulm htn, nl atrial size  . Osteoporosis, unspecified   . Paroxysmal atrial fibrillation ([place]) 01/2016   a. diagnosed 01/2016; b. has been on  eliquis, pradaxa, and xarelto at varying times 2/2 cost of each medication-->currently on eliquis (11/2016); c. CHADS2VASc --> 3 (HTN, age x 1, female).  . Pure hypercholesterolemia   . TIA (transient ischemic attack)   . Unspecified asthma(493.90)   . Vertigo     Patient Active Problem List   Diagnosis Date Noted  . History of colonic polyps   . Polyp of transverse colon   . Aneurysm ([place]) 02/08/2018  . History of CVA (cerebrovascular accident) without residual deficits 01/24/2018  . 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 TIA (transient ischemic attack) 01/22/2018  . Impingement syndrome of shoulder region 01/02/2018  . History of cataract surgery, right 09/01/2016  . Paroxysmal atrial fibrillation ([place]) 02/24/2016  . Vertigo 02/24/2016  . Low HDL (under 40) 02/17/2016  . Dizziness 02/15/2016  . Hx of colonic polyps   . Benign neoplasm of sigmoid colon   . Asthma, mild intermittent 02/21/2015  . Hyperglycemia 02/21/2015  . Essential hypertension 02/20/2015  . Inconclusive mammogram due to dense breasts 01/02/2015  . Dense breast 01/02/2015  . Abnormal EKG 02/23/2013    Past Surgical History:  Procedure Laterality Date  . ABDOMINAL HYSTERECTOMY    . CATARACT EXTRACTION Right 08/31/2016  . COLONOSCOPY WITH PROPOFOL N/A 03/04/2015   Procedure: COLONOSCOPY WITH PROPOFOL;  Surgeon: Lucilla Lame, MD;  Location: [facility] ENDOSCOPY;  Service: Endoscopy;  Laterality: N/A;  . COLONOSCOPY WITH PROPOFOL N/A 04/01/2020   Procedure: COLONOSCOPY WITH PROPOFOL;  Surgeon: Lucilla Lame, MD;  Location[facility] ENDOSCOPY;  Service: Endoscopy;  Laterality: N/A;    Prior to Admission medications   Medication Sig Start Date End Date Taking? Authorizing Provider  amLODipine (NORVASC) 10 MG tablet TAKE 1 TABLET(10 MG) BY MOUTH DAILY 02/24/20   Ancil Boozer, Drue Stager, MD  apixaban (ELIQUIS) 5 MG TABS tablet TAKE 1 TABLET(5 MG) BY MOUTH TWICE DAILY 03/04/20   Dunn, Areta Haber, PA-C  diltiazem (CARDIZEM) 30 MG tablet  Take 1 tablet (30 mg total) by mouth every 6 (six) hours as needed (for tachycardia/recurrent Afib.). 02/16/16   Theora Gianotti, NP  meclizine (ANTIVERT) 25 MG tablet Take 25 mg by mouth 3 (three) times daily as needed for dizziness.    [provider]  Omega-3 Fatty Acids (FISH OIL PO) Take by mouth.  07/13/19   [provider]  ondansetron (ZOFRAN ODT) 4 MG disintegrating tablet Take 1 tablet (4 mg total) by mouth every 8 (eight) hours as needed for up to 5 days. 05/05/20 05/10/20  Lannie Fields, PA-C  promethazine (PHENERGAN) 12.5 MG tablet Take 1 tablet (12.5 mg total) by mouth every 8 (eight) hours as needed for nausea or vomiting. 05/31/19   Steele Sizer, MD  rosuvastatin (CRESTOR) 10 MG tablet TAKE 1 TABLET(10 MG) BY MOUTH DAILY 02/24/20   Steele Sizer, MD  SYMBICORT 160-4.5 MCG/ACT inhaler INHALE 2 PUFFS INTO THE LUNGS TWICE DAILY 11/02/18   Steele Sizer, MD  traMADol (ULTRAM) 50 MG tablet Take 1 tablet (50 mg total) by mouth every 6 (six) hours as needed for up to 3 days. 05/05/20 05/08/20  Lannie Fields, PA-C  traZODone (DESYREL) 50 MG tablet Take 0.5-1 tablets (25-50 mg total) by mouth at bedtime as needed for sleep. 11/23/19   Steele Sizer, MD    Allergies Ace inhibitors and Penicillins  Family History  Problem Relation Age of Onset  . COPD Father   . Hypertension Mother   . Aneurysm Mother   . Hypertension Brother   . Stroke Brother   . Cancer Brother        pancreatic  . Healthy Brother     Social History Social History   Tobacco Use  . Smoking status: Never Smoker  . Smokeless tobacco: Never Used  . Tobacco comment: smoking cessation materials not required  Vaping Use  . Vaping Use: Never used  Substance Use Topics  . Alcohol use: No    Alcohol/week: 0.0 standard drinks  . Drug use: No     Review of Systems  Constitutional: No fever/chills Eyes:  No discharge ENT: No upper respiratory complaints. Respiratory: no cough.  No SOB/ use of accessory muscles to breath Gastrointestinal:   No nausea, no vomiting.  No diarrhea.  No constipation. Musculoskeletal: Patient has right knee pain.  Skin: Negative for rash, abrasions, lacerations, ecchymosis.    ____________________________________________   PHYSICAL EXAM:  VITAL SIGNS: ED Triage Vitals  Enc Vitals Group     BP 05/05/20 1450 (!) 143/67     Pulse Rate 05/05/20 1450 70     Resp 05/05/20 1450 17     Temp 05/05/20 1451 97.6 F (36.4 C)     Temp Source 05/05/20 1450 Oral     SpO2 05/05/20 1450 98 %     Weight 05/05/20 1451 180 lb (81.6 kg)     Height 05/05/20 1451  5\' 3"  (1.6 m)     Head Circumference --      Peak Flow --      Pain Score 05/05/20 1451 10     Pain Loc --      Pain Edu? --      Excl. in [place]? --      Constitutional: Alert and oriented. Well appearing and in no acute distress. Eyes: Conjunctivae are normal. PERRL. EOMI. Head: Atraumatic. Cardiovascular: Normal rate, regular rhythm. Normal S1 and S2.  Good peripheral circulation. Respiratory: Normal respiratory effort without tachypnea or retractions. Lungs CTAB. Good air entry to the bases with no decreased or absent breath sounds Gastrointestinal: Bowel sounds x 4 quadrants. Soft and nontender to palpation. No guarding or rigidity. No distention. Musculoskeletal: Patient performs limited range of motion at the right knee.  Palpable dorsalis pedis pulse, right. Neurologic:  Normal for age. No gross focal neurologic deficits are appreciated.  Skin:  Skin is warm, dry and intact. No rash noted. Psychiatric: Mood and affect are normal for age. Speech and behavior are normal.   ____________________________________________   LABS (all labs ordered are listed, but only abnormal results are displayed)  Labs Reviewed - No data to display ____________________________________________  EKG   ____________________________________________  RADIOLOGY Unk Pinto, personally  viewed and evaluated these images (plain radiographs) as part of my medical decision making, as well as reviewing the written report by the radiologist.    DG Knee Complete 4 Views Right  Result Date: 05/05/2020 CLINICAL DATA:  79 year old female with right knee pain. EXAM: RIGHT KNEE - COMPLETE 4+ VIEW COMPARISON:  None. FINDINGS: There is no acute fracture or dislocation. The bones are well mineralized. There is mild arthritic changes with mild tricompartmental narrowing and spurring. No joint effusion. The soft tissues are unremarkable. IMPRESSION: 1. No acute fracture or dislocation. 2. Mild arthritic changes. Electronically Signed   By: Anner Crete M.D.   On: 05/05/2020 16:42    ____________________________________________    PROCEDURES  Procedure(s) performed:     Procedures     Medications  traMADol (ULTRAM) tablet 50 mg (has no administration in time range)  ondansetron (ZOFRAN-ODT) disintegrating tablet 4 mg (has no administration in time range)     ____________________________________________   INITIAL IMPRESSION / ASSESSMENT AND PLAN / ED COURSE  Pertinent labs & imaging results that were available during my care of the patient were reviewed by me and considered in my medical decision making (see chart for details).      Assessment and plan Right knee pain 79 year old female presents to the emergency department with acute right knee pain.  Patient was mildly hypertensive at triage but vital signs were otherwise reassuring.  No bony abnormality was visualized on x-rays of the right knee.  Patient has no history of serotonin syndrome or seizures.  Patient was started on a short course of tramadol for pain and was provided a prescription for a walker.  She was advised to follow-up with orthopedics, Dr. Posey Pronto.  Return precautions were given to return with new or worsening symptoms.    ____________________________________________  FINAL CLINICAL  IMPRESSION(S) / ED DIAGNOSES  Final diagnoses:  Acute pain of right knee      NEW MEDICATIONS STARTED DURING THIS VISIT:  ED Discharge Orders         Ordered    traMADol (ULTRAM) 50 MG tablet  Every 6 hours PRN        05/05/20 1658    ondansetron (ZOFRAN  ODT) 4 MG disintegrating tablet  Every 8 hours PRN        05/05/20 1658    Walker standard        05/05/20 1659              This chart was dictated using voice recognition software/Dragon. Despite best efforts to proofread, errors can occur which can change the meaning. Any change was purely unintentional.     Lannie Fields, PA-C 05/05/20 1702    Blake Divine, MD 05/05/20 2337

## 2020-05-05 NOTE — ED Triage Notes (Signed)
Pt states she has been having right knee pain for the past 2 months and was seen at emerg ortho  On Friday and given a cortisone shot and had xray, states today while at work her knee just buckled and is now having severe pain. Pt arrives with ace wrap in place and ice pack.

## 2020-05-08 ENCOUNTER — Ambulatory Visit: Payer: Self-pay

## 2020-05-12 NOTE — Chronic Care Management (AMB) (Signed)
  Chronic Care Management   Note  05/12/2020 Name: [name] MRN: [id number] DOB: Jan 25, 1941   Call received from Ms. Monterroso to confirm pending appointments. She was recently evaluated in the Emergency Room for right knee pain. Called to confirm scheduled outreach with the Orthopedic Surgery team at [facility]. Confirmed pending appointment for 05/13/20.  Per Epic, she is also scheduled for a follow-up appointment with [facility]. Requested assistance with cancelling that appointment since she anticipates being followed by the team at [facility].   Follow up plan: Will plan to outreach again next month and establish new goals after she completes the initial visit with the [facility] team. She is agreeable to this plan. Agreed to call if additional assistance is needed.   [name] Care Management [facility] [phone]

## 2020-05-13 ENCOUNTER — Other Ambulatory Visit (HOSPITAL_COMMUNITY): Payer: Self-pay | Admitting: Orthopedic Surgery

## 2020-05-13 ENCOUNTER — Other Ambulatory Visit: Payer: Self-pay | Admitting: Orthopedic Surgery

## 2020-05-13 ENCOUNTER — Ambulatory Visit: Payer: Medicare Other | Admitting: Internal Medicine

## 2020-05-13 DIAGNOSIS — M1711 Unilateral primary osteoarthritis, right knee: Secondary | ICD-10-CM

## 2020-05-13 DIAGNOSIS — M2391 Unspecified internal derangement of right knee: Secondary | ICD-10-CM

## 2020-05-13 DIAGNOSIS — M2351 Chronic instability of knee, right knee: Secondary | ICD-10-CM | POA: Diagnosis not present

## 2020-05-21 ENCOUNTER — Other Ambulatory Visit: Payer: Self-pay

## 2020-05-21 ENCOUNTER — Ambulatory Visit
Admission: RE | Admit: 2020-05-21 | Discharge: 2020-05-21 | Disposition: A | Discharge status: Home / Self Care | Payer: Medicare Other | Source: Ambulatory Visit | Attending: Orthopedic Surgery | Admitting: Orthopedic Surgery

## 2020-05-21 DIAGNOSIS — M1711 Unilateral primary osteoarthritis, right knee: Secondary | ICD-10-CM | POA: Diagnosis present

## 2020-05-21 DIAGNOSIS — M2391 Unspecified internal derangement of right knee: Secondary | ICD-10-CM | POA: Diagnosis present

## 2020-05-21 DIAGNOSIS — M2351 Chronic instability of knee, right knee: Secondary | ICD-10-CM | POA: Diagnosis present

## 2020-05-21 DIAGNOSIS — M25561 Pain in right knee: Secondary | ICD-10-CM | POA: Diagnosis not present

## 2020-05-26 ENCOUNTER — Telehealth: Payer: Self-pay

## 2020-05-26 NOTE — Telephone Encounter (Signed)
  Chronic Care Management   Outreach Note  05/26/2020 Name: [name] MRN: [id number] DOB: April 18, 1941  Primary Care Provider: Steele Sizer, MD Reason for referral : Chronic Care Management   Attempted to return call to Ms. Whilden. She was not available at the time.  A HIPAA compliant voice message was left  requesting a return call.     Follow Up Plan:  Pending return call. Anticipate outreach within the next week.     [name] Care Management [facility] [phone]

## 2020-06-02 ENCOUNTER — Telehealth: Payer: Self-pay

## 2020-06-02 NOTE — Progress Notes (Signed)
Chronic Care Management Pharmacy Assistant   Name: [name]  MRN: [id number] DOB: 03/17/1941  Reason for Encounter: Disease State  Patient Questions:  1.  Have you seen any other providers since your last visit? Yes, Steele Sizer (PCP)  05/05/2020 Isidor Holts (orthopedic) 05/05/2020 ED 05/13/2020 Hampton Abbot (orthopedic)   2.  Any changes in your medicines or health? Yes, short term of tramadol (qty 12) and ondansteron (qty 15) given on 05/05/2020 for right knee pain.  05/13/2020 PT exercises for meniscus tear given;Votaren gel and Tylenol PRN 05/27/2020 pt needs to see Dr Posey Pronto for torn meniscus root tear in right knee.   [name],  79 y.o. , female presents for their Follow-Up CCM visit with the clinical pharmacist via telephone.  PCP : Steele Sizer, MD  Allergies:   Allergies  Allergen Reactions  . Ace Inhibitors Swelling    Patient states her tongue swell.  [name] Penicillins Anxiety    Has patient had a PCN reaction causing immediate rash, facial/tongue/throat swelling, SOB or lightheadedness with hypotension: No Has patient had a PCN reaction causing severe rash involving mucus membranes or skin necrosis: No Has patient had a PCN reaction that required hospitalization No Has patient had a PCN reaction occurring within the last 10 years: No If all of the above answers are "NO", then may proceed with Cephalosporin use.    Medications: Outpatient Encounter Medications as of 06/02/2020  Medication Sig  . amLODipine (NORVASC) 10 MG tablet TAKE 1 TABLET(10 MG) BY MOUTH DAILY  . apixaban (ELIQUIS) 5 MG TABS tablet TAKE 1 TABLET(5 MG) BY MOUTH TWICE DAILY  . diltiazem (CARDIZEM) 30 MG tablet Take 1 tablet (30 mg total) by mouth every 6 (six) hours as needed (for tachycardia/recurrent Afib.).  [name] meclizine (ANTIVERT) 25 MG tablet Take 25 mg by mouth 3 (three) times daily as needed for dizziness.  . Omega-3 Fatty Acids (FISH OIL PO) Take by mouth.   .  promethazine (PHENERGAN) 12.5 MG tablet Take 1 tablet (12.5 mg total) by mouth every 8 (eight) hours as needed for nausea or vomiting.  . rosuvastatin (CRESTOR) 10 MG tablet TAKE 1 TABLET(10 MG) BY MOUTH DAILY  . SYMBICORT 160-4.5 MCG/ACT inhaler INHALE 2 PUFFS INTO THE LUNGS TWICE DAILY  . traZODone (DESYREL) 50 MG tablet Take 0.5-1 tablets (25-50 mg total) by mouth at bedtime as needed for sleep.   No facility-administered encounter medications on file as of 06/02/2020.    Current Diagnosis: Patient Active Problem List   Diagnosis Date Noted  . History of colonic polyps   . Polyp of transverse colon   . Aneurysm ([place]) 02/08/2018  . History of CVA (cerebrovascular accident) without residual deficits 01/24/2018  . 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 TIA (transient ischemic attack) 01/22/2018  . Impingement syndrome of shoulder region 01/02/2018  . History of cataract surgery, right 09/01/2016  . Paroxysmal atrial fibrillation ([place]) 02/24/2016  . Vertigo 02/24/2016  . Low HDL (under 40) 02/17/2016  . Dizziness 02/15/2016  . Hx of colonic polyps   . Benign neoplasm of sigmoid colon   . Asthma, mild intermittent 02/21/2015  . Hyperglycemia 02/21/2015  . Essential hypertension 02/20/2015  . Inconclusive mammogram due to dense breasts 01/02/2015  . Dense breast 01/02/2015  . Abnormal EKG 02/23/2013    Goals Addressed   None     Follow-Up:  Coordination of Enhanced Pharmacy Services and Pharmacist Review  Pt needs f/u appt with CCM Pharmacist .. Reviewed chart  prior to disease state call. Spoke with patient regarding BP   Pt states her blood pressure is fine and wants to call me back.    Recent Office Vitals: BP Readings from Last 3 Encounters:  05/05/20 (!) 143/67  05/02/20 120/70  04/01/20 115/67   Pulse Readings from Last 3 Encounters:  05/05/20 70  05/02/20 91  04/01/20 66    Wt Readings from Last 3 Encounters:  05/05/20 180 lb (81.6 kg)  05/02/20 177  lb 14.4 oz (80.7 kg)  04/01/20 177 lb (80.3 kg)     Kidney Function Lab Results  Component Value Date/Time   CREATININE 1.00 (H) 02/22/2020 08:23 AM   CREATININE 0.90 07/31/2019 09:27 AM   CREATININE 0.93 01/09/2019 10:23 AM   CREATININE 0.94 (H) 06/12/2018 10:35 AM   GFRNONAA 54 (L) 02/22/2020 08:23 AM   GFRAA 63 02/22/2020 08:23 AM    BMP Latest Ref Rng & Units 02/22/2020 07/31/2019 01/09/2019  Glucose 65 - 99 mg/dL 97 109(H) 98  BUN 7 - 25 mg/dL 16 12 13   Creatinine 0.60 - 0.93 mg/dL 1.00(H) 0.90 0.93  BUN/Creat Ratio 6 - 22 (calc) 16 13 14   Sodium 135 - 146 mmol/L 139 141 141  Potassium 3.5 - 5.3 mmol/L 4.6 4.4 4.9  Chloride 98 - 110 mmol/L 106 107(H) 104  CO2 20 - 32 mmol/L 28 19(L) 22  Calcium 8.6 - 10.4 mg/dL 9.9 9.6 9.6    Call back not received. Call deferred until better time for pt.  Hollie W [employer]

## 2020-06-05 ENCOUNTER — Telehealth: Payer: Self-pay | Admitting: *Deleted

## 2020-06-05 ENCOUNTER — Other Ambulatory Visit: Payer: Self-pay | Admitting: Family Medicine

## 2020-06-05 DIAGNOSIS — M2391 Unspecified internal derangement of right knee: Secondary | ICD-10-CM | POA: Diagnosis not present

## 2020-06-05 DIAGNOSIS — I10 Essential (primary) hypertension: Secondary | ICD-10-CM

## 2020-06-05 NOTE — Chronic Care Management (AMB) (Signed)
  Care Management   Note  06/05/2020 Name: [name] MRN: [id number] DOB: 07-06-1940  [name] is a 79 y.o. year old female who is a primary care patient of Steele Sizer, MD and is actively engaged with the care management team. I reached out to [name] by phone today to assist with scheduling a follow up visit with the Pharmacist.  Follow up plan: Unsuccessful telephone outreach attempt made. A HIPAA compliant phone message was left for the patient providing contact information and requesting a return call. The care management team will reach out to the patient again over the next 7 days. If patient returns call to provider office, please advise to call [place] at [phone].  [place], [place] [address] Direct Dial: [phone] [email or web address]

## 2020-06-10 NOTE — Chronic Care Management (AMB) (Signed)
  Care Management   Note  06/10/2020 Name: [name] MRN: [id number] DOB: [birth year]  [name] is a 79 y.o. year old female who is a primary care patient of Steele Sizer, MD and is actively engaged with the care management team. I reached out to [name] by phone today to assist with scheduling a follow up visit with the Pharmacist.  Follow up plan: The patient has been provided with contact information for the care management team and has been advised to call with any health related questions or concerns. If patient returns call to provider office, please advise to call [place] at [phone].  [facility]

## 2020-06-13 ENCOUNTER — Telehealth: Payer: Self-pay

## 2020-06-13 DIAGNOSIS — S86811A Strain of other muscle(s) and tendon(s) at lower leg level, right leg, initial encounter: Secondary | ICD-10-CM | POA: Diagnosis not present

## 2020-06-13 DIAGNOSIS — M1711 Unilateral primary osteoarthritis, right knee: Secondary | ICD-10-CM | POA: Diagnosis not present

## 2020-06-13 NOTE — Telephone Encounter (Signed)
° ° ° °  06/13/2020 Name: [name] MRN: [id number] DOB: October 13, 1940  Primary Care Provider: Steele Sizer, MD   Call received from Ms. Murtha. She reports increased pain and edema to her right knee. Also notes bruising and discoloration to the area behind her right knee as well as her ankle. She is scheduled to be evaluated by the [facility] team today at 4 pm. She will also receive a gel injection today. Agreed to call if additional assistance is needed.    Follow Up Plan:  Will update provider and follow up within the next two weeks.    [name] Care Management [facility] [phone]

## 2020-06-23 ENCOUNTER — Ambulatory Visit: Payer: Self-pay

## 2020-06-23 DIAGNOSIS — I10 Essential (primary) hypertension: Secondary | ICD-10-CM

## 2020-06-23 DIAGNOSIS — I480 Paroxysmal atrial fibrillation: Secondary | ICD-10-CM

## 2020-06-23 DIAGNOSIS — M79604 Pain in right leg: Secondary | ICD-10-CM

## 2020-06-30 ENCOUNTER — Other Ambulatory Visit: Payer: Self-pay | Admitting: Orthopedic Surgery

## 2020-06-30 DIAGNOSIS — M79661 Pain in right lower leg: Secondary | ICD-10-CM

## 2020-07-01 ENCOUNTER — Ambulatory Visit
Admission: RE | Admit: 2020-07-01 | Discharge: 2020-07-01 | Disposition: A | Discharge status: Home / Self Care | Payer: Medicare Other | Source: Ambulatory Visit | Attending: Orthopedic Surgery | Admitting: Orthopedic Surgery

## 2020-07-01 ENCOUNTER — Other Ambulatory Visit: Payer: Self-pay

## 2020-07-01 ENCOUNTER — Ambulatory Visit: Payer: Medicare Other | Admitting: Internal Medicine

## 2020-07-01 DIAGNOSIS — M79661 Pain in right lower leg: Secondary | ICD-10-CM | POA: Insufficient documentation

## 2020-07-01 DIAGNOSIS — M7121 Synovial cyst of popliteal space [Baker], right knee: Secondary | ICD-10-CM | POA: Diagnosis not present

## 2020-07-02 NOTE — Chronic Care Management (AMB) (Signed)
  Chronic Care Management   Follow Up Note    Name: [name] MRN: [id number] DOB: [birth year]  Primary Care Provider: Steele Sizer, MD Reason for referral : Chronic Care Management  [name] is a 80 y.o. year old female who is a primary care patient of Steele Sizer, MD. She is currently enrolled in the Chronic Care Management program.   Review of [name]'s status, including review of consultants reports, relevant labs and test results was conducted today. Collaboration with appropriate care team members was performed as part of the comprehensive evaluation and provision of chronic care management services.    SDOH (Social Determinants of Health) assessments performed: No    Outpatient Encounter Medications as of 06/23/2020  Medication Sig  . amLODipine (NORVASC) 10 MG tablet TAKE 1 TABLET(10 MG) BY MOUTH DAILY  . apixaban (ELIQUIS) 5 MG TABS tablet TAKE 1 TABLET(5 MG) BY MOUTH TWICE DAILY  . diltiazem (CARDIZEM) 30 MG tablet Take 1 tablet (30 mg total) by mouth every 6 (six) hours as needed (for tachycardia/recurrent Afib.).  [name] meclizine (ANTIVERT) 25 MG tablet Take 25 mg by mouth 3 (three) times daily as needed for dizziness.  . Omega-3 Fatty Acids (FISH OIL PO) Take by mouth.   . promethazine (PHENERGAN) 12.5 MG tablet Take 1 tablet (12.5 mg total) by mouth every 8 (eight) hours as needed for nausea or vomiting.  . rosuvastatin (CRESTOR) 10 MG tablet TAKE 1 TABLET(10 MG) BY MOUTH DAILY  . SYMBICORT 160-4.5 MCG/ACT inhaler INHALE 2 PUFFS INTO THE LUNGS TWICE DAILY  . traZODone (DESYREL) 50 MG tablet Take 0.5-1 tablets (25-50 mg total) by mouth at bedtime as needed for sleep.   No facility-administered encounter medications on file as of 06/23/2020.       Goals Addressed            This Visit's Progress   . COMPLETED: Improve Ability to Self-Manage Chronic Illnesses       Current Barriers:  . Chronic Disease Management support and education needs  related to Hypertension and Atrial Fibrillation. Patient with history of TIA.  Case Manager Clinical Goal(s):  [name] Over the next 90 days, patient will continue taking medications as prescribed.-Complete . Over the next 90 days, patient will complete provider appointments as scheduled-Complete . Over the next 90 days, patient will monitor her blood pressure and record readings.-Complete . Over the next 90 days, patient will continue adherence with cardiac prudent/heart healthy diet.-Complete    Interventions:  . Discussed follow up plan for care management. Remains compliant with medications and treatment recommendations. She is currently being followed by Ortho team and receiving joint injections as recommended. No changes in care management needs. She is pending follow-up outreach with the CCM Pharmacist. Nursing care management goals have been met. She will call if additional outreach/assistance is required.    Patient Self Care Activities:  . Self administers medications . Attends provider appointments . Calls pharmacy for medication refills . Performs ADL's independently . Performs IADL's independently   Please see past updates related to this goal by clicking on the "Past Updates" button in the selected goal           PLAN [name] will contact the care management team if additional outreach/assistance is needed.     [name] Care Management [facility] [phone]

## 2020-07-02 NOTE — Patient Instructions (Addendum)
Thank you for allowing the Chronic Care Management team to participate in your care.   Goals Addressed            This Visit's Progress   . COMPLETED: Improve Ability to Self-Manage Chronic Illnesses       Current Barriers:  . Chronic Disease Management support and education needs related to Hypertension and Atrial Fibrillation. Patient with history of TIA.  Case Manager Clinical Goal(s):  [name] Over the next 90 days, patient will continue taking medications as prescribed.-Complete . Over the next 90 days, patient will complete provider appointments as scheduled-Complete . Over the next 90 days, patient will monitor her blood pressure and record readings.-Complete . Over the next 90 days, patient will continue adherence with cardiac prudent/heart healthy diet.-Complete    Interventions:  . Discussed follow up plan for care management. Remains compliant with medications and treatment recommendations. She is currently being followed by Ortho team and receiving joint injections as recommended. No changes in care management needs. She is pending follow-up outreach with the CCM Pharmacist. Nursing care management goals have been met. She will call if additional outreach/assistance is required.    Patient Self Care Activities:  . Self administers medications . Attends provider appointments . Calls pharmacy for medication refills . Performs ADL's independently . Performs IADL's independently   Please see past updates related to this goal by clicking on the "Past Updates" button in the selected goal          Ms. Helget verbalized understanding of the information discussed during the telephonic outreach today. Declined need for a mailed/printed copy of the information. She is scheduled for outreach with her PCP in February. She will contact the care management team if additional nursing outreach is needed.     [name] Care Management [facility] [phone]

## 2020-07-15 ENCOUNTER — Ambulatory Visit (INDEPENDENT_AMBULATORY_CARE_PROVIDER_SITE_OTHER): Payer: Medicare Other | Admitting: Family Medicine

## 2020-07-15 ENCOUNTER — Encounter: Payer: Self-pay | Admitting: Family Medicine

## 2020-07-15 ENCOUNTER — Other Ambulatory Visit: Payer: Self-pay

## 2020-07-15 ENCOUNTER — Telehealth: Payer: Self-pay

## 2020-07-15 VITALS — BP 130/82 | HR 82 | Temp 97.8°F | Resp 16 | Ht 63.0 in | Wt 171.6 lb

## 2020-07-15 DIAGNOSIS — M79671 Pain in right foot: Secondary | ICD-10-CM | POA: Diagnosis not present

## 2020-07-15 NOTE — Progress Notes (Signed)
Chronic Care Management Pharmacy Assistant   Name: [name]  MRN: [id number] DOB: [birth year]  Reason for [place] Call.   PCP : Kristin Sizer, MD  Allergies:   Allergies  Allergen Reactions  . Ace Inhibitors Swelling    Patient states her tongue swell.  [name] Penicillins Anxiety    Has patient had a PCN reaction causing immediate rash, facial/tongue/throat swelling, SOB or lightheadedness with hypotension: No Has patient had a PCN reaction causing severe rash involving mucus membranes or skin necrosis: No Has patient had a PCN reaction that required hospitalization No Has patient had a PCN reaction occurring within the last 10 years: No If all of the above answers are "NO", then may proceed with Cephalosporin use.    Medications: Outpatient Encounter Medications as of 07/15/2020  Medication Sig  . amLODipine (NORVASC) 10 MG tablet TAKE 1 TABLET(10 MG) BY MOUTH DAILY  . apixaban (ELIQUIS) 5 MG TABS tablet TAKE 1 TABLET(5 MG) BY MOUTH TWICE DAILY  . diltiazem (CARDIZEM) 30 MG tablet Take 1 tablet (30 mg total) by mouth every 6 (six) hours as needed (for tachycardia/recurrent Afib.).  [name] meclizine (ANTIVERT) 25 MG tablet Take 25 mg by mouth 3 (three) times daily as needed for dizziness.  . Omega-3 Fatty Acids (FISH OIL PO) Take by mouth.   . promethazine (PHENERGAN) 12.5 MG tablet Take 1 tablet (12.5 mg total) by mouth every 8 (eight) hours as needed for nausea or vomiting.  . rosuvastatin (CRESTOR) 10 MG tablet TAKE 1 TABLET(10 MG) BY MOUTH DAILY  . SYMBICORT 160-4.5 MCG/ACT inhaler INHALE 2 PUFFS INTO THE LUNGS TWICE DAILY  . traZODone (DESYREL) 50 MG tablet Take 0.5-1 tablets (25-50 mg total) by mouth at bedtime as needed for sleep.   No facility-administered encounter medications on file as of 07/15/2020.    Current Diagnosis: Patient Active Problem List   Diagnosis Date Noted  . Osteoarthritis of right knee 05/05/2020  . History of  colonic polyps   . Polyp of transverse colon   . Aneurysm ([place]) 02/08/2018  . History of CVA (cerebrovascular accident) without residual deficits 01/24/2018  . 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 TIA (transient ischemic attack) 01/22/2018  . Impingement syndrome of shoulder region 01/02/2018  . History of cataract surgery, right 09/01/2016  . Paroxysmal atrial fibrillation ([place]) 02/24/2016  . Vertigo 02/24/2016  . Low HDL (under 40) 02/17/2016  . Dizziness 02/15/2016  . Hx of colonic polyps   . Benign neoplasm of sigmoid colon   . Asthma, mild intermittent 02/21/2015  . Hyperglycemia 02/21/2015  . Essential hypertension 02/20/2015  . Inconclusive mammogram due to dense breasts 01/02/2015  . Dense breast 01/02/2015  . Abnormal EKG 02/23/2013    Goals Addressed   None     Reviewed chart prior to disease state call. Spoke with patient regarding BP  Recent Office Vitals: BP Readings from Last 3 Encounters:  07/15/20 130/82  05/05/20 (!) 143/67  05/02/20 120/70   Pulse Readings from Last 3 Encounters:  07/15/20 82  05/05/20 70  05/02/20 91    Wt Readings from Last 3 Encounters:  07/15/20 171 lb 9.6 oz (77.8 kg)  05/05/20 180 lb (81.6 kg)  05/02/20 177 lb 14.4 oz (80.7 kg)     Kidney Function Lab Results  Component Value Date/Time   CREATININE 1.00 (H) 02/22/2020 08:23 AM   CREATININE 0.90 07/31/2019 09:27 AM   CREATININE 0.93 01/09/2019 10:23 AM   CREATININE 0.94 (H) 06/12/2018  10:35 AM   GFRNONAA 54 (L) 02/22/2020 08:23 AM   GFRAA 63 02/22/2020 08:23 AM    BMP Latest Ref Rng & Units 02/22/2020 07/31/2019 01/09/2019  Glucose 65 - 99 mg/dL 97 109(H) 98  BUN 7 - 25 mg/dL 16 12 13   Creatinine 0.60 - 0.93 mg/dL 1.00(H) 0.90 0.93  BUN/Creat Ratio 6 - 22 (calc) 16 13 14   Sodium 135 - 146 mmol/L 139 141 141  Potassium 3.5 - 5.3 mmol/L 4.6 4.4 4.9  Chloride 98 - 110 mmol/L 106 107(H) 104  CO2 20 - 32 mmol/L 28 19(L) 22  Calcium 8.6 - 10.4 mg/dL 9.9 9.6 9.6     . Current antihypertensive regimen:  o Amlodipine 10 mg Tablet Daily o Diltiazem 30 mg tablet PRN . How often are you checking your Blood Pressure? infrequently . Current home BP readings: Patient states her blood pressure has been good and normal. . What recent interventions/DTPs have been made by any provider to improve Blood Pressure control since last CPP Visit: None ID  . Any recent hospitalizations or ED visits since last visit with CPP? No . What diet changes have been made to improve Blood Pressure Control?  o Patient states she cooks at home here and there  . What exercise is being done to improve your Blood Pressure Control?  o Patient states she is unable to exercise because she has knee pain.  Adherence Review: Is the patient currently on ACE/ARB medication? No Does the patient have >5 day gap between last estimated fill dates? Yes   Maryjean Ka  Follow-Up:  Pharmacist Review   [place] Clinical Pharmacist Assistant [phone]

## 2020-07-15 NOTE — Progress Notes (Signed)
Name: [name]   MRN: [id number]    DOB: 1940/10/29   Date:07/15/2020       Progress Note  Subjective  Chief Complaint  Chief Complaint  Patient presents with  . knot on foot    Right foot, x1 week    HPI  Right foot pain and swelling: she noticed a sudden onset of a knot on top of right foot associated with pain one week ago. She does not recall any injury, she just  noticed the increase in pigmentation on the distal aspect of her foot / like a bruise a few days after the sudden onset of pain and knot formation. She states not getting any worse. She has been using a cane because she has OA and baker's cyst of right also meniscal tear. She avoid bearing weight on right side because of it. She was wearing a brace on right knee back in Dec but developed large bruise on right calf, doppler US negative for DVT01/2022 She does not have a follow up with Dr. Posey Pronto, she was advised to call when ready to have surgery with Dr. Estelle June She does not have a history of gout. She takes Eliquis for Afib    Patient Active Problem List   Diagnosis Date Noted  . History of colonic polyps   . Polyp of transverse colon   . Aneurysm ([place]) 02/08/2018  . History of CVA (cerebrovascular accident) without residual deficits 01/24/2018  . 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 TIA (transient ischemic attack) 01/22/2018  . Impingement syndrome of shoulder region 01/02/2018  . History of cataract surgery, right 09/01/2016  . Paroxysmal atrial fibrillation ([place]) 02/24/2016  . Vertigo 02/24/2016  . Low HDL (under 40) 02/17/2016  . Dizziness 02/15/2016  . Hx of colonic polyps   . Benign neoplasm of sigmoid colon   . Asthma, mild intermittent 02/21/2015  . Hyperglycemia 02/21/2015  . Essential hypertension 02/20/2015  . Inconclusive mammogram due to dense breasts 01/02/2015  . Dense breast 01/02/2015  . Abnormal EKG 02/23/2013    Past Surgical History:  Procedure Laterality Date  . ABDOMINAL  HYSTERECTOMY    . CATARACT EXTRACTION Right 08/31/2016  . COLONOSCOPY WITH PROPOFOL N/A 03/04/2015   Procedure: COLONOSCOPY WITH PROPOFOL;  Surgeon: Lucilla Lame, MD;  Location: [facility] ENDOSCOPY;  Service: Endoscopy;  Laterality: N/A;  . COLONOSCOPY WITH PROPOFOL N/A 04/01/2020   Procedure: COLONOSCOPY WITH PROPOFOL;  Surgeon: Lucilla Lame, MD;  [facility] ENDOSCOPY;  Service: Endoscopy;  Laterality: N/A;    Family History  Problem Relation Age of Onset  . COPD Father   . Hypertension Mother   . Aneurysm Mother   . Hypertension Brother   . Stroke Brother   . Cancer Brother        pancreatic  . Healthy Brother     Social History   Tobacco Use  . Smoking status: Never Smoker  . Smokeless tobacco: Never Used  . Tobacco comment: smoking cessation materials not required  Substance Use Topics  . Alcohol use: No    Alcohol/week: 0.0 standard drinks     Current Outpatient Medications:  .  amLODipine (NORVASC) 10 MG tablet, TAKE 1 TABLET(10 MG) BY MOUTH DAILY, Disp: 90 tablet, Rfl: 0 .  apixaban (ELIQUIS) 5 MG TABS tablet, TAKE 1 TABLET(5 MG) BY MOUTH TWICE DAILY, Disp: 60 tablet, Rfl: 6 .  diltiazem (CARDIZEM) 30 MG tablet, Take 1 tablet (30 mg total) by mouth every 6 (six) hours as needed (for tachycardia/recurrent Afib.).,  Disp: 30 tablet, Rfl: 3 .  meclizine (ANTIVERT) 25 MG tablet, Take 25 mg by mouth 3 (three) times daily as needed for dizziness., Disp: , Rfl:  .  Omega-3 Fatty Acids (FISH OIL PO), Take by mouth. , Disp: , Rfl:  .  promethazine (PHENERGAN) 12.5 MG tablet, Take 1 tablet (12.5 mg total) by mouth every 8 (eight) hours as needed for nausea or vomiting., Disp: 6 tablet, Rfl: 0 .  rosuvastatin (CRESTOR) 10 MG tablet, TAKE 1 TABLET(10 MG) BY MOUTH DAILY, Disp: 90 tablet, Rfl: 1 .  SYMBICORT 160-4.5 MCG/ACT inhaler, INHALE 2 PUFFS INTO THE LUNGS TWICE DAILY, Disp: 10.2 g, Rfl: 0 .  traZODone (DESYREL) 50 MG tablet, Take 0.5-1 tablets (25-50 mg total) by mouth at bedtime  as needed for sleep., Disp: 30 tablet, Rfl: 0  Allergies  Allergen Reactions  . Ace Inhibitors Swelling    Patient states her tongue swell.  [name] Penicillins Anxiety    Has patient had a PCN reaction causing immediate rash, facial/tongue/throat swelling, SOB or lightheadedness with hypotension: No Has patient had a PCN reaction causing severe rash involving mucus membranes or skin necrosis: No Has patient had a PCN reaction that required hospitalization No Has patient had a PCN reaction occurring within the last 10 years: No If all of the above answers are "NO", then may proceed with Cephalosporin use.    I personally reviewed active problem list, medication list, allergies, family history, social history, health maintenance with the patient/caregiver today.   ROS  Ten systems reviewed and is negative except as mentioned in HPI   Objective  Vitals:   07/15/20 1006  BP: 130/82  Pulse: 82  Resp: 16  Temp: 97.8 F (36.6 C)  TempSrc: Oral  SpO2: 95%  Weight: 171 lb 9.6 oz (77.8 kg)  Height: 5\' 3"  (1.6 m)    Body mass index is 30.4 kg/m.  Physical Exam  Constitutional: Patient appears well-developed and well-nourished. Obese No distress.  HEENT: head atraumatic, normocephalic, pupils equal and reactive to light, neck supple Cardiovascular: Normal rate, regular rhythm and normal heart sounds.  No murmur heard Pulmonary/Chest: Effort normal and breath sounds normal. No respiratory distress. Muscular skeletal: mild effusion right knee, also has area of erythema and soft knot that is tender to touch on right dorsal mid-foot. Ecchymosis on distal foot over metatarsal bones 2nd- 5th, no pain during compression of sides of the foot, some pain with compression of the area that has a knot. Normal rom of motion of ankle.  Abdominal: Soft.  There is no tenderness. Psychiatric: Patient has a normal mood and affect. behavior is normal. Judgment and thought content  normal.  PHQ2/9: Depression screen [facility] 2/9 07/15/2020 05/02/2020 02/22/2020 01/31/2020 11/23/2019  Decreased Interest 0 0 0 0 0  Down, Depressed, Hopeless 0 0 0 0 0  PHQ - 2 Score 0 0 0 0 0  Altered sleeping - - 0 - 0  Tired, decreased energy - - 0 - 0  Change in appetite - - 0 - 0  Feeling bad or failure about yourself  - - 0 - 0  Trouble concentrating - - 0 - 0  Moving slowly or fidgety/restless - - 0 - 0  Suicidal thoughts - - 0 - 0  PHQ-9 Score - - 0 - 0  Difficult doing work/chores - - - - -  Some recent data might be hidden    phq 9 is negative   Fall Risk: Fall Risk  07/15/2020 05/02/2020  02/22/2020 01/31/2020 11/23/2019  Falls in the past year? 0 0 0 0 0  Number falls in past yr: 0 0 0 0 0  Injury with Fall? 0 0 0 0 0  Risk for fall due to : - - - No Fall Risks -  Risk for fall due to: Comment - - - - -  Follow up - - - Falls prevention discussed -     Functional Status Survey: Is the patient deaf or have difficulty hearing?: No Does the patient have difficulty seeing, even when wearing glasses/contacts?: No Does the patient have difficulty concentrating, remembering, or making decisions?: No Does the patient have difficulty walking or climbing stairs?: No Does the patient have difficulty dressing or bathing?: No Does the patient have difficulty doing errands alone such as visiting a doctor's office or shopping?: No   Assessment & Plan  1. Acute foot pain, right  - Uric acid - Ambulatory referral to Podiatry.

## 2020-07-16 ENCOUNTER — Encounter: Payer: Self-pay | Admitting: Cardiovascular Disease

## 2020-07-16 ENCOUNTER — Ambulatory Visit: Payer: Medicare Other | Admitting: Cardiovascular Disease

## 2020-07-16 DIAGNOSIS — I10 Essential (primary) hypertension: Secondary | ICD-10-CM

## 2020-07-16 DIAGNOSIS — E785 Hyperlipidemia, unspecified: Secondary | ICD-10-CM | POA: Diagnosis not present

## 2020-07-16 DIAGNOSIS — I480 Paroxysmal atrial fibrillation: Secondary | ICD-10-CM | POA: Diagnosis not present

## 2020-07-16 LAB — URIC ACID: Uric Acid, Serum: 4.2 mg/dL (ref 2.5–7.0)

## 2020-07-16 NOTE — Patient Instructions (Signed)

## 2020-07-16 NOTE — Progress Notes (Signed)
Cardiology Office Note   Date:  07/16/2020   ID:  [name], DOB 24-Apr-1941, MRN [id number]  PCP:  Steele Sizer, MD  Cardiologist:   Kathlyn Sacramento, MD   Chief Complaint  Patient presents with  . Other    6 month follow up. Patient c.o swelling around the ankle and foot. Patient states some toe discoloration. Meds reviewed verbally with patient.       History of Present Illness: [name] is a 80 y.o. female who presents for a follow-up visit regarding paroxysmal atrial fibrillation.  Atrial fibrillation happened in the past in the setting of severe vertigo and it was felt to be vagally mediated.  Other past medical history includes TIA, vertigo, small left thalamic artery aneurysm, essential hypertension, hyperlipidemia and insomnia. She was hospitalized in July of 2019 with spasms involving her left hand.  MRI of the brain showed no evidence of acute stroke.  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  She had no eye symptoms.  She had an echocardiogram done that showed normal LV systolic function with no cardiac source of emboli.  Carotid Doppler showed mild nonobstructive disease.   She had a Lexiscan Myoview in July 2020 that was normal.  It was done for atypical chest pain. Echocardiogram in July 2020 showed an EF of 55 to 60%, mild biatrial enlargement and trivial mitral regurgitation. She has been doing well from a cardiac standpoint with no chest pain, shortness of breath or palpitations.  No tachycardia.  She has been dealing with right knee discomfort due to torn meniscus and she might need surgery in the near future.  She also had some swelling in the right leg and had a recent venous Doppler that showed no evidence of DVT.  She has a tender spot on the dorsal side of the right foot with some discoloration.  Her pulses are normal there.   Past Medical History:  Diagnosis Date  . History of stress test    a. 02/2013 Nl stress test.   . Hypertension   . Insomnia, unspecified   . Mitral regurgitation    a. echo 01/2016: nl LV sys fxn, mild MR, w/o pulm htn, nl atrial size  . Osteoporosis, unspecified   . Paroxysmal atrial fibrillation ([place]) 01/2016   a. diagnosed 01/2016; b. has been on eliquis, pradaxa, and xarelto at varying times 2/2 cost of each medication-->currently on eliquis (11/2016); c. CHADS2VASc --> 3 (HTN, age x 1, female).  . Pure hypercholesterolemia   . TIA (transient ischemic attack)   . Unspecified asthma(493.90)   . Vertigo     Past Surgical History:  Procedure Laterality Date  . ABDOMINAL HYSTERECTOMY    . CATARACT EXTRACTION Right 08/31/2016  . COLONOSCOPY WITH PROPOFOL N/A 03/04/2015   Procedure: COLONOSCOPY WITH PROPOFOL;  Surgeon: Lucilla Lame, MD;  Location: [facility] ENDOSCOPY;  Service: Endoscopy;  Laterality: N/A;  . COLONOSCOPY WITH PROPOFOL N/A 04/01/2020   Procedure: COLONOSCOPY WITH PROPOFOL;  Surgeon: Lucilla Lame, MD;  Location: [facility] ENDOSCOPY;  Service: Endoscopy;  Laterality: N/A;     Current Outpatient Medications  Medication Sig Dispense Refill  . amLODipine (NORVASC) 10 MG tablet TAKE 1 TABLET(10 MG) BY MOUTH DAILY 90 tablet 0  . apixaban (ELIQUIS) 5 MG TABS tablet TAKE 1 TABLET(5 MG) BY MOUTH TWICE DAILY 60 tablet 6  . diltiazem (CARDIZEM) 30 MG tablet Take 1 tablet (30 mg total) by mouth every 6 (six) hours as needed (for tachycardia/recurrent Afib.). [place]  tablet 3  . meclizine (ANTIVERT) 25 MG tablet Take 25 mg by mouth 3 (three) times daily as needed for dizziness.    . Omega-3 Fatty Acids (FISH OIL PO) Take 1 tablet by mouth daily.    . promethazine (PHENERGAN) 12.5 MG tablet Take 1 tablet (12.5 mg total) by mouth every 8 (eight) hours as needed for nausea or vomiting. 6 tablet 0  . rosuvastatin (CRESTOR) 10 MG tablet TAKE 1 TABLET(10 MG) BY MOUTH DAILY 90 tablet 1  . SYMBICORT 160-4.5 MCG/ACT inhaler INHALE 2 PUFFS INTO THE LUNGS TWICE DAILY 10.2 g 0  . traZODone (DESYREL) 50 MG  tablet Take 0.5-1 tablets (25-50 mg total) by mouth at bedtime as needed for sleep. 30 tablet 0   No current facility-administered medications for this visit.    Allergies:   Ace inhibitors and Penicillins    Social History:  The patient  reports that she has never smoked. She has never used smokeless tobacco. She reports that she does not drink alcohol and does not use drugs.   Family History:  The patient's family history includes Aneurysm in her mother; COPD in her father; Cancer in her brother; Healthy in her brother; Hypertension in her brother and mother; Stroke in her brother.    ROS:  Please see the history of present illness.   Otherwise, review of systems are positive for none.   All other systems are reviewed and negative.    PHYSICAL EXAM: VS:  BP 102/60 (BP Location: Left Arm, Patient Position: Sitting, Cuff Size: Normal)   Pulse 61   Ht 5\' 3"  (1.6 m)   Wt 172 lb (78 kg)   SpO2 96%   BMI 30.47 kg/m  , BMI Body mass index is 30.47 kg/m. GEN: Well nourished, well developed, in no acute distress  HEENT: normal  Neck: no JVD, carotid bruits, or masses Cardiac: RRR; no murmurs, rubs, or gallops,no edema  Respiratory:  clear to auscultation bilaterally, normal work of breathing GI: soft, nontender, nondistended, + BS MS: no deformity or atrophy  Skin: warm and dry, no rash Neuro:  Strength and sensation are intact Psych: euthymic mood, full affect Mild swelling of the right calf.  There is a tender spot on the dorsal side of the right foot but dorsalis pedis and posterior tibial pulse are normal.  EKG:  EKG is ordered today. EKG showed normal sinus rhythm with no significant ST or T wave changes.  Recent Labs: 02/22/2020: ALT 21; BUN 16; Creat 1.00; Hemoglobin 14.3; Platelets 397; Potassium 4.6; Sodium 139    Lipid Panel    Component Value Date/Time   CHOL 106 02/22/2020 0823   CHOL 98 (L) 01/09/2019 1023   TRIG 52 02/22/2020 0823   HDL 46 (L) 02/22/2020 0823    HDL 39 (L) 01/09/2019 1023   CHOLHDL 2.3 02/22/2020 0823   VLDL 7 01/22/2018 1154   LDLCALC 47 02/22/2020 0823   LDLDIRECT 46 01/09/2019 1023      Wt Readings from Last 3 Encounters:  07/16/20 172 lb (78 kg)  07/15/20 171 lb 9.6 oz (77.8 kg)  05/05/20 180 lb (81.6 kg)        PAD Screen 02/23/2016  Previous PAD dx? No  Previous surgical procedure? No  Pain with walking? No  Feet/toe relief with dangling? No  Painful, non-healing ulcers? No  Extremities discolored? No      ASSESSMENT AND PLAN:  1.   Paroxysmal atrial fibrillation: She is maintaining in sinus rhythm with  no symptomatic episodes over the last year.  She is tolerating anticoagulation with Eliquis with no side effects.  Eliquis is expensive and I asked her to investigate different options under her Medicare plan.   2. Essential hypertension: Blood pressure is controlled on current medications.  3. Possible benign positional vertigo.  Seems to be overall controlled.  4.  Hyperlipidemia: Currently on rosuvastatin.  Most recent lipid profile showed an LDL of 47.  5.  Right leg pain and swelling: Negative for DVT by recent Doppler.  She was noted to have a large Baker's cyst which might be causing some of her symptoms.  Foot symptoms do not seem vascular in etiology as she has normal pulses.   Disposition:   FU with me in 6 months  Signed,  Kathlyn Sacramento, MD  07/16/2020 9:51 AM    [place]

## 2020-07-18 ENCOUNTER — Ambulatory Visit: Payer: Medicare Other | Admitting: Nurse Practitioner

## 2020-07-31 ENCOUNTER — Ambulatory Visit: Payer: Medicare Other | Admitting: Cardiovascular Disease

## 2020-08-05 ENCOUNTER — Ambulatory Visit: Payer: Self-pay

## 2020-08-08 NOTE — Chronic Care Management (AMB) (Signed)
  Chronic Care Management   Outreach Note   Name: [name] MRN: [id number] DOB: 1941-01-01  Primary Care Provider: Steele Sizer, MD    Call received from [name] regarding pending appointments. Confirmed pending appointment with her Neurologist, Dr. Manuella Ghazi on 08/18/20. She will complete a primary care follow-up visit on 08/25/20. Agreed to call if assistance with rescheduling is needed.   [name] Care Management [facility] [phone]

## 2020-08-18 DIAGNOSIS — I672 Cerebral atherosclerosis: Secondary | ICD-10-CM | POA: Diagnosis not present

## 2020-08-18 DIAGNOSIS — I729 Aneurysm of unspecified site: Secondary | ICD-10-CM | POA: Diagnosis not present

## 2020-08-18 DIAGNOSIS — G459 Transient cerebral ischemic attack, unspecified: Secondary | ICD-10-CM | POA: Diagnosis not present

## 2020-08-18 DIAGNOSIS — G4733 Obstructive sleep apnea (adult) (pediatric): Secondary | ICD-10-CM | POA: Diagnosis not present

## 2020-08-20 NOTE — Progress Notes (Signed)
Name: [name]   MRN: [id number]    DOB: [birth year]   Date:08/25/2020       Progress Note  Subjective  Chief Complaint  Follow up   HPI   Right foot edema: seen here, had a referral to Podiatrist, but symptoms resolved and she cancelled the appointment   HTN:she take medication daily, bp at home is 120-130's she denies chest pain ,denies decrease in exercise tolerance , very seldom now has palpitation. Denies dizziness. BP today is towards low end of normal She states following a weight loss diet. She states last week was salads only, this week meat and vegetables. We will change norvasc from 10 mg to 5 mg and she will try taking it bid .   Pre-diabetes: last hgb A1C was 6.1 %  Denies polyphagia, polydipsia or polyuria. She has been trying to avoid carbs.We will recheck labs today   Paroxysmal Afib: sees Dr. Fletcher Anon, symptoms started August 2017, she is onEliquis daily and prn Cardizem, she staes it has been months since she took a dose of Cardizem.   History of CVA/TIA and also aneursym of left para ophthalmic artery , under the care of Dr. Manuella Ghazi- neurologistShe is now taking Crestor and denies side effects.. She has occasional right frontal headache and soreness on right side of neck , symptoms are stable and unchanged   MR angio head without contrast from 01/23/2020 compared with previous one done  09/08/2018:  IMPRESSION: Stable appearance of 3 mm left paraophthalmic ICA aneurysm.  No new aneurysm or high-grade narrowing.  Asthma: she states she states noticing a little more symptoms lately, with some wheezing but no cough or SOB. No chest tightness. She states she has not been using medication lately, but will like to resume it   OSA: seen by Dr. Manuella Ghazi- neurologist, unable to tolerate CPAP, sleep study done 06/30/2018, she was supposed to have an oral appliance. She went to [facility] but insurance did not cover it. She could not afford it   Vertigo: she  has vertigo with head movement, motion sickness, and when she has an episode she needs to go to [facility] by EMS because of severe nausea, vomiting and diarrhea01/2018.She had one recent Summer of 2020 but resolved by itself . She states promethazine is more helpful than Meclizineprn. Episodes are sporadic, last episode was about two weeks ago and lasted 24 hours. She still has medications at home   Insomnia: she states a couple of times a week she has difficulty falling asleep. She states mind is busy at night and cannot wind down at times. She is still taking Trazodone prn . Unchanged   OA right knee: seeing Ortho waiting to have surgery this Summer when her daughter can come over to take care of her.   Weight loss: she started a diet last Fall. She states doing it with her sister. She has lost 13 lbs in the past 6 months, explained that weight loss at her age makes me worry, but she states she is intentionally trying to lose weight.   Patient Active Problem List   Diagnosis Date Noted  . Osteoarthritis of right knee 05/05/2020  . History of colonic polyps   . Polyp of transverse colon   . Aneurysm ([place]) 02/08/2018  . History of CVA (cerebrovascular accident) without residual deficits 01/24/2018  . 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 TIA (transient ischemic attack) 01/22/2018  . Impingement syndrome of shoulder region 01/02/2018  . History of cataract  surgery, right 09/01/2016  . Paroxysmal atrial fibrillation ([place]) 02/24/2016  . Vertigo 02/24/2016  . Low HDL (under 40) 02/17/2016  . Dizziness 02/15/2016  . Hx of colonic polyps   . Benign neoplasm of sigmoid colon   . Asthma, mild intermittent 02/21/2015  . Hyperglycemia 02/21/2015  . Essential hypertension 02/20/2015  . Inconclusive mammogram due to dense breasts 01/02/2015  . Dense breast 01/02/2015  . Abnormal EKG 02/23/2013    Past Surgical History:  Procedure Laterality Date  . ABDOMINAL HYSTERECTOMY    . CATARACT  EXTRACTION Right 08/31/2016  . COLONOSCOPY WITH PROPOFOL N/A 03/04/2015   Procedure: COLONOSCOPY WITH PROPOFOL;  Surgeon: Lucilla Lame, MD;  Location: [facility] ENDOSCOPY;  Service: Endoscopy;  Laterality: N/A;  . COLONOSCOPY WITH PROPOFOL N/A 04/01/2020   Procedure: COLONOSCOPY WITH PROPOFOL;  Surgeon: Lucilla Lame, MD;  Location: [facility] ENDOSCOPY;  Service: Endoscopy;  Laterality: N/A;    Family History  Problem Relation Age of Onset  . COPD Father   . Hypertension Mother   . Aneurysm Mother   . Hypertension Brother   . Stroke Brother   . Cancer Brother        pancreatic  . Healthy Brother     Social History   Tobacco Use  . Smoking status: Never Smoker  . Smokeless tobacco: Never Used  . Tobacco comment: smoking cessation materials not required  Substance Use Topics  . Alcohol use: No    Alcohol/week: 0.0 standard drinks     Current Outpatient Medications:  .  amLODipine (NORVASC) 10 MG tablet, TAKE 1 TABLET(10 MG) BY MOUTH DAILY, Disp: 90 tablet, Rfl: 0 .  apixaban (ELIQUIS) 5 MG TABS tablet, TAKE 1 TABLET(5 MG) BY MOUTH TWICE DAILY, Disp: 60 tablet, Rfl: 6 .  diltiazem (CARDIZEM) 30 MG tablet, Take 1 tablet (30 mg total) by mouth every 6 (six) hours as needed (for tachycardia/recurrent Afib.)., Disp: 30 tablet, Rfl: 3 .  meclizine (ANTIVERT) 25 MG tablet, Take 25 mg by mouth 3 (three) times daily as needed for dizziness., Disp: , Rfl:  .  Omega-3 Fatty Acids (FISH OIL PO), Take 1 tablet by mouth daily., Disp: , Rfl:  .  promethazine (PHENERGAN) 12.5 MG tablet, Take 1 tablet (12.5 mg total) by mouth every 8 (eight) hours as needed for nausea or vomiting., Disp: 6 tablet, Rfl: 0 .  rosuvastatin (CRESTOR) 10 MG tablet, TAKE 1 TABLET(10 MG) BY MOUTH DAILY, Disp: 90 tablet, Rfl: 1 .  SYMBICORT 160-4.5 MCG/ACT inhaler, INHALE 2 PUFFS INTO THE LUNGS TWICE DAILY, Disp: 10.2 g, Rfl: 0 .  traZODone (DESYREL) 50 MG tablet, Take 0.5-1 tablets (25-50 mg total) by mouth at bedtime as needed for  sleep., Disp: 30 tablet, Rfl: 0  Allergies  Allergen Reactions  . Ace Inhibitors Swelling    Patient states her tongue swell.  [name] Penicillins Anxiety    Has patient had a PCN reaction causing immediate rash, facial/tongue/throat swelling, SOB or lightheadedness with hypotension: No Has patient had a PCN reaction causing severe rash involving mucus membranes or skin necrosis: No Has patient had a PCN reaction that required hospitalization No Has patient had a PCN reaction occurring within the last 10 years: No If all of the above answers are "NO", then may proceed with Cephalosporin use.    I personally reviewed active problem list, medication list, allergies, family history, social history, health maintenance with the patient/caregiver today.   ROS  Constitutional: Negative for fever, positive  weight change.  Respiratory: Negative for cough  and shortness of breath.   Cardiovascular: Negative for chest pain or palpitations.  Gastrointestinal: Negative for abdominal pain, no bowel changes.  Musculoskeletal: positive  for gait problem and right  joint swelling.  Skin: Negative for rash.  Neurological: positive  For intermittent  dizziness but no headache.  No other specific complaints in a complete review of systems (except as listed in HPI above).  Objective  Vitals:   08/25/20 0830  BP: 108/64  Pulse: 77  Resp: 16  Temp: 97.9 F (36.6 C)  TempSrc: Oral  SpO2: 97%  Weight: 167 lb 12.8 oz (76.1 kg)  Height: 5\' 3"  (1.6 m)    Body mass index is 29.72 kg/m.  Physical Exam  Constitutional: Patient appears well-developed and well-nourished. Overweight.  No distress.  HEENT: head atraumatic, normocephalic, pupils equal and reactive to light,  neck supple Cardiovascular: Normal rate, regular rhythm and normal heart sounds.  No murmur heard. No BLE edema. Pulmonary/Chest: Effort normal and breath sounds normal. No respiratory distress. Abdominal: Soft.  There is no  tenderness. Psychiatric: Patient has a normal mood and affect. behavior is normal. Judgment and thought content normal.  Recent Results (from the past 2160 hour(s))  Uric acid     Status: None   Collection Time: 07/15/20 10:37 AM  Result Value Ref Range   Uric Acid, Serum 4.2 2.5 - 7.0 mg/dL    Comment: Therapeutic target for gout patients: <6.0 mg/dL .     PHQ2/9: Depression screen [facility] 2/9 08/25/2020 07/15/2020 05/02/2020 02/22/2020 01/31/2020  Decreased Interest 0 0 0 0 0  Down, Depressed, Hopeless 0 0 0 0 0  PHQ - 2 Score 0 0 0 0 0  Altered sleeping - - - 0 -  Tired, decreased energy - - - 0 -  Change in appetite - - - 0 -  Feeling bad or failure about yourself  - - - 0 -  Trouble concentrating - - - 0 -  Moving slowly or fidgety/restless - - - 0 -  Suicidal thoughts - - - 0 -  PHQ-9 Score - - - 0 -  Difficult doing work/chores - - - - -  Some recent data might be hidden    phq 9 is negative   Fall Risk: Fall Risk  08/25/2020 07/15/2020 05/02/2020 02/22/2020 01/31/2020  Falls in the past year? 0 0 0 0 0  Number falls in past yr: 0 0 0 0 0  Injury with Fall? 0 0 0 0 0  Risk for fall due to : - - - - No Fall Risks  Risk for fall due to: Comment - - - - -  Follow up - - - - Falls prevention discussed    Functional Status Survey: Is the patient deaf or have difficulty hearing?: No Does the patient have difficulty seeing, even when wearing glasses/contacts?: No Does the patient have difficulty concentrating, remembering, or making decisions?: No Does the patient have difficulty walking or climbing stairs?: No Does the patient have difficulty dressing or bathing?: No Does the patient have difficulty doing errands alone such as visiting a doctor's office or shopping?: No   Assessment & Plan  1. Aneurysm ([place])  Stable, under the care of Dr. Manuella Ghazi   2. History of TIA (transient ischemic attack)  On statin and Eliquis   3. OSA (obstructive sleep apnea)  Cannot tolerate CPAP  and oral piece too expensive   4. Stage 3a chronic kidney disease ([place])  Reviewed last labs, advised to  avoid NSAID's  5. History of CVA (cerebrovascular accident) without residual deficits  - rosuvastatin (CRESTOR) 10 MG tablet; Take 1 tablet (10 mg total) by mouth daily.  Dispense: 90 tablet; Refill: 1  6. Essential hypertension  - amLODipine (NORVASC) 5 MG tablet; Take 1 tablet (5 mg total) by mouth in the morning and at bedtime. Hold evening dose if bp below 115/75  Dispense: 180 tablet; Refill: 0  7. Paroxysmal atrial fibrillation ([facility])  Rate controlled   8. Pre-diabetes  Reviewed diabetic diet   9. Mild intermittent asthma without complication  - albuterol (VENTOLIN HFA) 108 (90 Base) MCG/ACT inhaler; Inhale 2 puffs into the lungs every 6 (six) hours as needed for wheezing or shortness of breath.  Dispense: 18 g; Refill: 0  10. Dyslipidemia  - rosuvastatin (CRESTOR) 10 MG tablet; Take 1 tablet (10 mg total) by mouth daily.  Dispense: 90 tablet; Refill: 1

## 2020-08-22 ENCOUNTER — Telehealth: Payer: Self-pay

## 2020-08-22 NOTE — Telephone Encounter (Signed)
Pt was seen in January for acute visit and is scheduled to come in on Monday for her 5m fu. She wanted to ask if she need to keep the Monday appt since she was just seen.

## 2020-08-22 NOTE — Telephone Encounter (Signed)
Left vm to let pt know that dr Ancil Boozer has asked her to keep her appt for monday

## 2020-08-25 ENCOUNTER — Ambulatory Visit (INDEPENDENT_AMBULATORY_CARE_PROVIDER_SITE_OTHER): Payer: Medicare Other | Admitting: Family Medicine

## 2020-08-25 ENCOUNTER — Encounter: Payer: Self-pay | Admitting: Family Medicine

## 2020-08-25 ENCOUNTER — Other Ambulatory Visit: Payer: Self-pay

## 2020-08-25 VITALS — BP 108/64 | HR 77 | Temp 97.9°F | Resp 16 | Ht 63.0 in | Wt 167.8 lb

## 2020-08-25 DIAGNOSIS — E785 Hyperlipidemia, unspecified: Secondary | ICD-10-CM

## 2020-08-25 DIAGNOSIS — J4520 Mild intermittent asthma, uncomplicated: Secondary | ICD-10-CM | POA: Diagnosis not present

## 2020-08-25 DIAGNOSIS — Z8673 Personal history of transient ischemic attack (TIA), and cerebral infarction without residual deficits: Secondary | ICD-10-CM | POA: Diagnosis not present

## 2020-08-25 DIAGNOSIS — G4733 Obstructive sleep apnea (adult) (pediatric): Secondary | ICD-10-CM | POA: Diagnosis not present

## 2020-08-25 DIAGNOSIS — I10 Essential (primary) hypertension: Secondary | ICD-10-CM

## 2020-08-25 DIAGNOSIS — I480 Paroxysmal atrial fibrillation: Secondary | ICD-10-CM | POA: Diagnosis not present

## 2020-08-25 DIAGNOSIS — N1831 Chronic kidney disease, stage 3a: Secondary | ICD-10-CM

## 2020-08-25 DIAGNOSIS — I729 Aneurysm of unspecified site: Secondary | ICD-10-CM

## 2020-08-25 DIAGNOSIS — R7303 Prediabetes: Secondary | ICD-10-CM

## 2020-08-25 MED ORDER — ALBUTEROL SULFATE HFA 108 (90 BASE) MCG/ACT IN AERS: 2.0000 | INHALATION_SPRAY | Freq: Four times a day (QID) | RESPIRATORY_TRACT | 0 refills | Status: DC | PRN

## 2020-08-25 MED ORDER — ROSUVASTATIN CALCIUM 10 MG PO TABS: 10.0000 mg | ORAL_TABLET | Freq: Every day | ORAL | 1 refills | Status: DC

## 2020-08-25 MED ORDER — AMLODIPINE BESYLATE 5 MG PO TABS: 5.0000 mg | ORAL_TABLET | Freq: Two times a day (BID) | ORAL | 0 refills | Status: DC

## 2020-08-28 ENCOUNTER — Ambulatory Visit: Payer: Medicare Other | Admitting: Cardiovascular Disease

## 2020-09-02 ENCOUNTER — Telehealth: Payer: Self-pay

## 2020-09-02 NOTE — Telephone Encounter (Signed)
Called patient and left message on vmail and asked her to call and schedule new patient consult with dr Devona Konig per [facility] for consult. beth

## 2020-09-04 ENCOUNTER — Ambulatory Visit: Payer: Self-pay

## 2020-09-04 DIAGNOSIS — I10 Essential (primary) hypertension: Secondary | ICD-10-CM

## 2020-09-04 NOTE — Chronic Care Management (AMB) (Signed)
  Chronic Care Management   Note  09/04/2020 Name: [name] MRN: [id number] DOB: July 08, 1940    Call received from [name]. Reports amlodipine dose was recently decreased from 10mg  to 5mg . Wanted to confirm instructions for administering. Reviewed instructions to take 5mg  by mouth in the morning and 5mg  by mouth in the evening. Instructed to hold evening dose if her blood pressure is below 115/75. She verbalized understanding of the instructions. Agreed to call if additional assistance is needed.   Follow up plan: [name] will call with questions or concerns if needed. The care management team will gladly assist.   Horris Latino [facility] Care Management [facility] [phone]

## 2020-09-10 ENCOUNTER — Other Ambulatory Visit: Payer: Self-pay | Admitting: Family Medicine

## 2020-09-10 DIAGNOSIS — I10 Essential (primary) hypertension: Secondary | ICD-10-CM

## 2020-09-16 DIAGNOSIS — M2391 Unspecified internal derangement of right knee: Secondary | ICD-10-CM | POA: Diagnosis not present

## 2020-09-16 DIAGNOSIS — M1711 Unilateral primary osteoarthritis, right knee: Secondary | ICD-10-CM | POA: Diagnosis not present

## 2020-09-29 ENCOUNTER — Telehealth: Payer: Self-pay | Admitting: *Deleted

## 2020-09-29 ENCOUNTER — Other Ambulatory Visit: Payer: Self-pay

## 2020-09-29 ENCOUNTER — Encounter
Admission: RE | Admit: 2020-09-29 | Discharge: 2020-09-29 | Disposition: A | Discharge status: Home / Self Care | Payer: Medicare Other | Source: Ambulatory Visit | Attending: Orthopedic Surgery | Admitting: Orthopedic Surgery

## 2020-09-29 ENCOUNTER — Ambulatory Visit: Payer: Self-pay

## 2020-09-29 ENCOUNTER — Telehealth: Payer: Self-pay | Admitting: Cardiovascular Disease

## 2020-09-29 DIAGNOSIS — I480 Paroxysmal atrial fibrillation: Secondary | ICD-10-CM

## 2020-09-29 HISTORY — DX: Sleep apnea, unspecified: G47.30

## 2020-09-29 MED ORDER — ELIQUIS 5 MG PO TABS: ORAL_TABLET | ORAL | 6 refills | Status: DC

## 2020-09-29 NOTE — Telephone Encounter (Signed)
Request for pre-operative cardiac clearance Received: Today Karen Kitchens, NP  P [facility] Request for pre-operative cardiac clearance:    1. What type of surgery is being performed?  RIGHT KNEE ARTHROSCOPY   2. When is this surgery scheduled?  10/08/2020    3. Are there any medications that need to be held prior to surgery?  APIXABAN x 3 days (last dose 10/04/2020)   4. Practice name and name of physician performing surgery?  Performing surgeon: Dr. Skip Estimable, MD  Requesting clearance: Honor Loh, FNP-C     5. Anesthesia type (none, local, MAC, general)? General   6. What is the office phone and fax number?   Phone: [phone]  Fax: [phone]   ATTENTION: Unable to create telephone message as per your standard workflow. Directed by [facility] providers to send requests for cardiac clearance to this pool for appropriate distribution to provider covering pre-operative clearances.   Honor Loh, MSN, APRN, FNP-C, CEN  [facility]  Peri-operative Services Nurse Practitioner  Phone: [phone]  09/29/20 3:19 PM

## 2020-09-29 NOTE — Telephone Encounter (Signed)
Pt's age 80, wt 75.3 kg, SCr 1, CrCl 56, last ov w/ MA 07/16/20.

## 2020-09-29 NOTE — Telephone Encounter (Signed)
Patient with diagnosis of afib on Eliquis for anticoagulation.    Procedure: right knee arthroscopy Date of procedure: 10/08/20  CHA2DS2-VASc Score = 7  This indicates a 11.2% annual risk of stroke. The patient's score is based upon: CHF History: No HTN History: Yes Diabetes History: No Stroke History: Yes Vascular Disease History: Yes Age Score: 2 Gender Score: 1  CrCl 23mL/min using adjusted body weight Platelet count 397K  Request is to hold Eliquis for 3 days prior to procedure. Given elevated cardiovascular risk including hx of TIA/CHADS2VASc score of 7, will forward to MD for input.

## 2020-09-29 NOTE — Patient Instructions (Addendum)
Your procedure is scheduled on: 10/08/20 Report to [facility]. To find out your arrival time please call [phone] between 1PM - 3PM on 10/07/20.  Remember: Instructions that are not followed completely may result in serious medical risk, up to and including death, or upon the discretion of your surgeon and anesthesiologist your surgery may need to be rescheduled.     _X__ 1. Do not eat food after midnight the night before your procedure.                 No gum chewing or hard candies. You may drink clear liquids up to 2 hours                 before you are scheduled to arrive for your surgery- DO not drink clear                 liquids within 2 hours of the start of your surgery.                 Clear Liquids include:  water, apple juice without pulp, clear carbohydrate                 drink such as Clearfast or Gatorade, Black Coffee or Tea (Do not add                 anything to coffee or tea). Diabetics water only  __X__2.  On the morning of surgery brush your teeth with toothpaste and water, you                 may rinse your mouth with mouthwash if you wish.  Do not swallow any              toothpaste of mouthwash.     _X__ 3.  No Alcohol for 24 hours before or after surgery.   _X__ 4.  Do Not Smoke or use e-cigarettes For 24 Hours Prior to Your Surgery.                 Do not use any chewable tobacco products for at least 6 hours prior to                 surgery.  ____  5.  Bring all medications with you on the day of surgery if instructed.   __X__  6.  Notify your doctor if there is any change in your medical condition      (cold, fever, infections).     Do not wear jewelry, make-up, hairpins, clips or nail polish. Do not wear lotions, powders, or perfumes.  Do not shave 48 hours prior to surgery. Men may shave face and neck. Do not bring valuables to the hospital.    [facility] is not responsible for any belongings or  valuables.  Contacts, dentures/partials or body piercings may not be worn into surgery. Bring a case for your contacts, glasses or hearing aids, a denture cup will be supplied. Leave your suitcase in the car. After surgery it may be brought to your room. For patients admitted to the hospital, discharge time is determined by your treatment team.   Patients discharged the day of surgery will not be allowed to drive home.   Please read over the following fact sheets that you were given:   MRSA Information, CHG SOAP  __X__ Take these medicines the morning of surgery with A SIP OF WATER:  1. amLODipine (NORVASC) 5 MG tablet  2. diltiazem (CARDIZEM) 30 MG tablet IF NEEDED  3.   4.  5.  6.  ____ Fleet Enema (as directed)   __X__ Use CHG Soap/SAGE wipes as directed  __X__ Use inhalers on the day of surgery  ____ Stop metformin/Janumet/Farxiga 2 days prior to surgery    ____ Take 1/2 of usual insulin dose the night before surgery. No insulin the morning          of surgery.   __X__ LAST DOSE OF ELIQUIS WILL BE 10/04/20 (Saturday)  __X__ Stop Anti-inflammatories 7 days before surgery such as Advil, Ibuprofen, Motrin,  BC or Goodies Powder, Naprosyn, Naproxen, Aleve, Aspirin    __X__ Stop all herbal supplements, fish oil or vitamin E until after surgery.    ____ Bring C-Pap to the hospital.

## 2020-09-29 NOTE — Telephone Encounter (Signed)
*  STAT* If patient is at the pharmacy, call can be transferred to refill team.   1. Which medications need to be refilled? (please list name of each medication and dose if known)  Eliquis 5 MG   2. Which pharmacy/location (including street and city if local pharmacy) is medication to be sent to? [facility] in [place]   3. Do they need a 30 day or 90 day supply? 30 day

## 2020-09-29 NOTE — Telephone Encounter (Signed)
Pharmacy, please comment on how long patient can hold Eliquis for upcoming procedure?  Thank you! 

## 2020-09-30 ENCOUNTER — Encounter: Payer: Self-pay | Admitting: Orthopedic Surgery

## 2020-09-30 NOTE — Telephone Encounter (Signed)
I recommend holding Eliquis only for 2 days before the procedure and resume as soon as possible after surgery.

## 2020-09-30 NOTE — Progress Notes (Addendum)
Perioperative Services  Pre-Admission/Anesthesia Testing Clinical Review  Date: 10/07/20  Patient Demographics:  Name: [name] DOB:   27-Nov-1940 MRN:   [id number]  Planned Surgical Procedure(s):    Case: [id number] Date/Time: 10/08/20 1526   Procedure: ARTHROSCOPY KNEE (Right Knee)   Anesthesia type: Choice   Pre-op diagnosis: INTERNAL DERANGEMENT OF RIGHT KNEE.   Location: [facility] OR ROOM 01 / [place] [facility]S FOR ANESTHESIA GROUP   Surgeons: Dereck Leep, MD    NOTE: Available PAT nursing documentation and vital signs have been reviewed. Clinical nursing staff has updated patient's PMH/PSHx, current medication list, and drug allergies/intolerances to ensure comprehensive history available to assist in medical decision making as it pertains to the aforementioned surgical procedure and anticipated anesthetic course.   Clinical Discussion:  [name] is a 80 y.o. female who is submitted for pre-surgical anesthesia review and clearance prior to her undergoing the above procedure. Patient has never been a smoker. Pertinent PMH includes: paroxysmal atrial fibrillation, MV regurgitation, CVA/TIA, left thalamic artery aneurysm, HTN, HLD, asthma, OSA (unable to tolerate nocturnal PAP therapy), OA, insomnia.  Patient is followed by cardiology Fletcher Anon, MD). She was last seen in the cardiology clinic on 07/16/2020; notes reviewed.  At the time of her clinic visit, patient doing well overall from a cardiovascular perspective.  She denies any chest pain, shortness of breath, PND, orthopnea, palpitations, peripheral edema, vertiginous symptoms, and presyncope/syncope.  PMH (+) for paroxysmal atrial fibrillation. CHA2DS2-VASc Score = 7 (age x 2, sex, HTN, CVA/TIA, vascular disease) . Patient chronically anticoagulated using apixaban; compliant with therapy with no evidence of GI bleeding.  Rate and rhythm controlled on daily 30 mg diltiazem dose. Last TTE performed on 01/18/2019 revealed normal left  ventricular systolic function with an EF of 55-60%.  Myocardial perfusion imaging study performed on 01/11/2019 revealed no evidence of significant stress-induced myocardial ischemia or arrhythmia (see full interpretation of cardiovascular testing below). Patient on GDMT for her HTN and HLD diagnoses.  Blood pressure well controlled at 102/60 on currently prescribed CCB monotherapy.  Patient is on a statin for her HLD. Functional capacity, as defined by DASI, is documented as being >/= 4 METS.  No changes were made to patient's medication regimen.  Patient to follow-up with outpatient cardiology in 6 months or sooner if needed.  Patient is scheduled to undergo an elective orthopedic procedure on 10/08/2020 with Dr. Skip Estimable.  Given patient's past medical history significant for cardiovascular diagnoses, presurgical cardiac clearance was sought by the PAT team. Per cardiology, "based on patient's past medical history and time since his last clinic visit, patient would be at an overall LOW risk for the planned procedure without further cardiovascular testing or intervention at this time". Again, this patient is on daily anticoagulation therapy.  She has been instructed on recommendations from cardiology for only holding her apixaban dose for 2 days prior to her procedure with plans to restart as soon as postoperative bleeding risk felt to be minimized by primary attending surgeon.  Of note, Dr. Marry Guan originally requested 3 day apixaban wash out prior to surgery; patient advised to stop as of 10/04/2020. Several attempts were made to contact patient to discuss that cardiology only wanting her to hold for 2 days; patient failed to return phone calls to cardiology despite messages being left. Patient finally able to be contacted on 10/06/2020 by cardiology, at which time she advised that she last took her apixaban dose on 10/04/2020 as originally instructed.   Patient denies previous  perioperative complications  with anesthesia in the past. In review of the available records, it is noted that patient underwent a general anesthetic course here (ASA III) in 03/2020 without documented complications.   Vitals with BMI 09/29/2020 08/25/2020 07/16/2020  Height 5\' 3"  5\' 3"  5\' 3"   Weight 166 lbs 167 lbs 13 oz 172 lbs  BMI 29.41 73.41 93.79  Systolic - 024 097  Diastolic - 64 60  Pulse - 77 61    Providers/Specialists:   NOTE: Primary physician provider listed below. Patient may have been seen by APP or partner within same practice.   PROVIDER ROLE / SPECIALTY LAST OV  Hooten, Laurice Record, MD Orthopedics (Surgeon)  09/16/2020  Steele Sizer, MD Primary Care Provider  08/25/2020  Marlyne Beards, MD Cardiology  07/16/2020   Allergies:  Ace inhibitors and Penicillins  Current Home Medications:   No current facility-administered medications for this encounter.   [name] albuterol (VENTOLIN HFA) 108 (90 Base) MCG/ACT inhaler  . amLODipine (NORVASC) 5 MG tablet  . diltiazem (CARDIZEM) 30 MG tablet  . meclizine (ANTIVERT) 25 MG tablet  . ondansetron (ZOFRAN-ODT) 4 MG disintegrating tablet  . rosuvastatin (CRESTOR) 10 MG tablet  . apixaban (ELIQUIS) 5 MG TABS tablet   History:   Past Medical History:  Diagnosis Date  . History of stress test    a. 02/2013 Nl stress test.  . Hypertension   . Insomnia, unspecified   . Mitral regurgitation    a. echo 01/2016: nl LV sys fxn, mild MR, w/o pulm htn, nl atrial size  . Osteoporosis, unspecified   . Paroxysmal atrial fibrillation ([place]) 01/2016   a. diagnosed 01/2016; b. has been on eliquis, pradaxa, and xarelto at varying times 2/2 cost of each medication-->currently on eliquis (11/2016)  . Pure hypercholesterolemia   . Sleep apnea    Unable to tolerate nocturnal PAP therapy  . TIA (transient ischemic attack)   . Unspecified asthma(493.90)   . Vertigo    Past Surgical History:  Procedure Laterality Date  . ABDOMINAL HYSTERECTOMY    . CATARACT EXTRACTION  Right 08/31/2016  . COLONOSCOPY WITH PROPOFOL N/A 03/04/2015   Procedure: COLONOSCOPY WITH PROPOFOL;  Surgeon: Lucilla Lame, MD;  Location: [facility] ENDOSCOPY;  Service: Endoscopy;  Laterality: N/A;  . COLONOSCOPY WITH PROPOFOL N/A 04/01/2020   Procedure: COLONOSCOPY WITH PROPOFOL;  Surgeon: Lucilla Lame, MD;  Location: [facility] ENDOSCOPY;  Service: Endoscopy;  Laterality: N/A;  . DIAGNOSTIC LAPAROSCOPY     REMOVAL OF SCAR TISSUE (ADHESIONS)   Family History  Problem Relation Age of Onset  . COPD Father   . Hypertension Mother   . Aneurysm Mother   . Hypertension Brother   . Stroke Brother   . Cancer Brother        pancreatic  . Healthy Brother    Social History   Tobacco Use  . Smoking status: Never Smoker  . Smokeless tobacco: Never Used  . Tobacco comment: smoking cessation materials not required  Vaping Use  . Vaping Use: Never used  Substance Use Topics  . Alcohol use: No    Alcohol/week: 0.0 standard drinks  . Drug use: No    Pertinent Clinical Results:  LABS: Labs reviewed: Acceptable for surgery.  Hospital Outpatient Visit on 10/06/2020  Component Date Value Ref Range Status  . SARS Coronavirus 2 10/06/2020 NEGATIVE  NEGATIVE Final   Comment: (NOTE) SARS-CoV-2 target nucleic acids are NOT DETECTED.  The SARS-CoV-2 RNA is generally detectable in upper and lower respiratory specimens during  the acute phase of infection. Negative results do not preclude SARS-CoV-2 infection, do not rule out co-infections with other pathogens, and should not be used as the sole basis for treatment or other patient management decisions. Negative results must be combined with clinical observations, patient history, and epidemiological information. The expected result is Negative.  Fact Sheet for Patients: SugarRoll.be  Fact Sheet for Healthcare Providers: https://www.woods-mathews.com/  This test is not yet approved or cleared by the [place]  FDA and  has been authorized for detection and/or diagnosis of SARS-CoV-2 by FDA under an Emergency Use Authorization (EUA). This EUA will remain  in effect (meaning this test can be used) for the duration of the COVID-19 declaration under Se                          ction 564(b)(1) of the Act, 21 U.S.C. section 360bbb-3(b)(1), unless the authorization is terminated or revoked sooner.  Performed at [facility], [place] [address], [place], [place] [address]   . Sodium 10/06/2020 138  135 - 145 mmol/L Final  . Potassium 10/06/2020 3.8  3.5 - 5.1 mmol/L Final  . Chloride 10/06/2020 104  98 - 111 mmol/L Final  . CO2 10/06/2020 27  22 - 32 mmol/L Final  . Glucose, Bld 10/06/2020 119* 70 - 99 mg/dL Final   Glucose reference range applies only to samples taken after fasting for at least 8 hours.  . BUN 10/06/2020 16  8 - 23 mg/dL Final  . Creatinine, Ser 10/06/2020 1.01* 0.44 - 1.00 mg/dL Final  . Calcium 10/06/2020 9.6  8.9 - 10.3 mg/dL Final  . GFR, Estimated 10/06/2020 57* >60 mL/min Final   Comment: (NOTE) Calculated using the CKD-EPI Creatinine Equation (2021)   . Anion gap 10/06/2020 7  5 - 15 Final   Performed at [facility], [address] [place], [place] [address]  . WBC 10/06/2020 5.8  4.0 - 10.5 K/uL Final  . RBC 10/06/2020 4.77  3.87 - 5.11 MIL/uL Final  . Hemoglobin 10/06/2020 14.6  12.0 - 15.0 g/dL Final  . HCT 10/06/2020 43.4  36.0 - 46.0 % Final  . MCV 10/06/2020 91.0  80.0 - 100.0 fL Final  . MCH 10/06/2020 30.6  26.0 - 34.0 pg Final  . MCHC 10/06/2020 33.6  30.0 - 36.0 g/dL Final  . RDW 10/06/2020 13.2  11.5 - 15.5 % Final  . Platelets 10/06/2020 381  150 - 400 K/uL Final  . nRBC 10/06/2020 0.0  0.0 - 0.2 % Final   Performed at [facility], [address] [place], [place] [address]    ECG: Date: 07/16/2020 Time ECG obtained: 0944 AM Rate: 61 bpm Rhythm: normal sinus Axis (leads I and aVF): Normal Intervals: PR 140 ms. QRS 106 ms.  QTc 408 ms. ST segment and T wave changes: No evidence of acute ST segment elevation or depression Comparison: Similar to previous tracing obtained on 03/04/2020   IMAGING / PROCEDURES: MRI KNEE RIGHT WITHOUT CONTRAST performed on 05/21/2020 1. Complete nondisplaced radial tear root of the posterior horn of the medial meniscus. 2. More peripherally in the posterior horn, a horizontal tear reaches the meniscal undersurface that extends into the posterior body of the medial meniscus. 3. Mild osteoarthritis about the knee 4. Baker's cyst  MRI ANGIO HEAD WITHOUT CONTRAST performed on 01/23/2020 1. Stable appearance of a 3 mm left paraophthalmic ICA aneurysm 2. No new aneurysm or high-grade narrowing  LEXISCAN performed on 01/11/2019 1. No evidence of stress-induced myocardial ischemia or arrhythmia 2. Overall low risk study  ECHOCARDIOGRAM performed on 01/18/2019 1. The left ventricle has normal systolic function, with an ejection fraction of 55-60%.  2. The LV cavity size was normal.  3. There is mildly increased left ventricular wall thickness.  4. Left ventricular diastolic Doppler parameters are consistent with impaired relaxation.  5. No evidence of left ventricular regional wall motion abnormalities.  6. The right ventricle has normal systolic function.  7. The RV cavity was normal.  8. There is no increase in right ventricular wall thickness.  9. Right ventricular systolic pressure could not be assessed.  10. Left atrial size was mildly dilated.  11. Right atrial size was mildly dilated.  12. The aortic valve is tricuspid. Mild aortic annular calcification noted.  13. The mitral valve is grossly normal.  14. The aorta is normal in size and structure.  15. The interatrial septum was not well visualized.   BILATERAL CAROTID DOPPLER performed on 01/22/2018 1. No significant stenosis in the carotid arteries bilaterally by Doppler criteria (0-49%).  2. No significant carotid  plaque. 3. Normal antegrade flow in both vertebral arteries.  Impression and Plan:  CAYLIN NASS has been referred for pre-anesthesia review and clearance prior to her undergoing the planned anesthetic and procedural courses. Available labs, pertinent testing, and imaging results were personally reviewed by me. This patient has been appropriately cleared by cardiology with an overall LOW risk of significant perioperative cardiovascular complications.  Based on clinical review performed today (10/07/20), barring any significant acute changes in the patient's overall condition, it is anticipated that she will be able to proceed with the planned surgical intervention. Any acute changes in clinical condition may necessitate her procedure being postponed and/or cancelled. Pre-surgical instructions were reviewed with the patient during her PAT appointment and questions were fielded by PAT clinical staff.  Honor Loh, MSN, APRN, FNP-C, CEN [facility]  Peri-operative Services Nurse Practitioner Phone: [phone] 10/07/20 3:02 PM  NOTE: This note has been prepared using Dragon dictation software. Despite my best ability to proofread, there is always the potential that unintentional transcriptional errors may still occur from this process.

## 2020-10-01 NOTE — Telephone Encounter (Signed)
Left message to call back and ask to speak with pre-op team.  Darreld Mclean, PA-C 10/01/2020 8:18 AM

## 2020-10-02 NOTE — Telephone Encounter (Signed)
Honor Loh, NP sent staff message checking on clearance request as procedure is Wed 4/13 and tomorrow is Friday 4/8.

## 2020-10-03 NOTE — Telephone Encounter (Signed)
Left message on the home phone to contact cardiology service for preop clearance.  I also instructed her to hold Eliquis for 2 days prior to the procedure.  I attempted to call her mobile phone, it was picked up for a second and quickly hanged up.  When I attempted to call the mobile phone again second time, it keeps ringing without anybody picking it up.

## 2020-10-05 NOTE — H&P (Signed)
ORTHOPAEDIC HISTORY & PHYSICAL [name], Florinda Marker., MD - 09/16/2020 3:15 PM EDT Formatting of this note is different from the original. Images from the original note were not included. Chief Complaint: Chief Complaint  Patient presents with  . Knee Pain  Right knee degenerative arthsosis   Reason for Visit: The patient is a 80 y.o. female who presents today for reevaluation of her right knee. She reported the sudden onset of right knee pain in late October. She was sitting down and felt a "pop" and had the sudden onset of pain. She localizes most of the pain along the medial and posterior aspect of the knee. She reports significant swelling, no locking, and some giving way of the knee. The pain is aggravated by lateral movements, pivoting and rising after sitting. The patient has not appreciated any significant improvement despite intraarticular corticosteroid injections, viscosupplementation, knee brace, and activity modification. She is unable to tolerate NSAIDs due to anticoagulation with Eliquis.   Medications: Current Outpatient Medications  Medication Sig Dispense Refill  . acetaminophen (TYLENOL) 325 MG tablet Take 325 mg by mouth nightly as needed for Pain  . amLODIPine (NORVASC) 10 MG tablet Take 10 mg by mouth once daily  . cloNIDine HCl (CATAPRES) 0.1 MG tablet 0.1 mg 2 (two) times daily as needed  . diltiazem (CARDIZEM) 30 MG tablet Take 30 mg by mouth once daily as needed (heart flutter) 3  . ELIQUIS 5 mg tablet Take 5 mg by mouth 2 (two) times daily 3  . meclizine (ANTIVERT) 25 mg tablet 25 mg 3 (three) times daily as needed 0  . rosuvastatin (CRESTOR) 10 MG tablet Take 10 mg by mouth once daily   No current facility-administered medications for this visit.   Allergies: Allergies  Allergen Reactions  . Ace Inhibitors Swelling  Patient states her tongue swell.  [name] Penicillins Anxiety  Has patient had a PCN reaction causing immediate rash, facial/tongue/throat  swelling, SOB or lightheadedness with hypotension: No Has patient had a PCN reaction causing severe rash involving mucus membranes or skin necrosis: No Has patient had a PCN reaction that required hospitalization No Has patient had a PCN reaction occurring within the last 10 years: No If all of the above answers are "NO", then may proceed with Cephalosporin use.   Past Medical History: Past Medical History:  Diagnosis Date  . Atrial fibrillation (CMS-HCC)  . Hyperlipidemia  . Hypertension  . Stroke (CMS-HCC)  TIA   Past Surgical History: Past Surgical History:  Procedure Laterality Date  . CARPAL TUNNEL RELEASE  . HYSTERECTOMY   Social History: Social History   Socioeconomic History  . Marital status: Divorced  Spouse name: Not on file  . Number of children: 2  . Years of education: 3  . Highest education level: Associate degree: academic program  Occupational History  . Occupation: Part-time- Electrical engineer  Tobacco Use  . Smoking status: Never Smoker  . Smokeless tobacco: Never Used  Vaping Use  . Vaping Use: Never used  Substance and Sexual Activity  . Alcohol use: Never  . Drug use: Never  . Sexual activity: Defer  Partners: Male  Other Topics Concern  . Not on file  Social History Narrative  Education: na  Occupation: retired  Hobbies: na  Marital Status: divorced   Social Determinants of Radio broadcast assistant Strain: Not on file  Food Insecurity: Not on file  Transportation Needs: Not on file  Physical Activity: Not on file  Stress: Not  on file  Social Connections: Not on file  Housing Stability: Not on file   Family History: Family History  Problem Relation Age of Onset  . Aneurysm Mother  . Cancer Brother  . High blood pressure (Hypertension) Daughter  . Migraines Daughter   Review of Systems: A comprehensive 14 point ROS was performed, reviewed, and the pertinent orthopaedic findings are documented in the  HPI.  Exam BP 106/68  Temp 36.2 C (97.2 F)  Ht 160 cm (5\' 3" )  Wt 76.4 kg (168 lb 6.4 oz)  BMI 29.83 kg/m   General:  Well-developed, well-nourished female seen in no acute distress.  Antalgic gait.  No varus or valgus thrust to the right knee.  HEENT:  Atraumatic, normocephalic. Pupils are equal and reactive to light. Extraocular motion is intact. Sclera are clear. Oropharynx is clear with moist mucosa.  Lungs:  Clear to auscultation bilaterally.  Cardiovascular: Regular rate and rhythm. Normal S1, S2. No murmur . No appreciable gallops or rubs. Peripheral pulses are palpable. No lower extremity edema. Homan`s test is negative.   Extremities: Good strength, stability, and range of motion of the upper extremities. Good range of motion of the hips and ankles.  Right Knee:  Soft tissue swelling: moderate Effusion: mild Erythema: none Crepitance: mild Tenderness: medial, posterior Alignment: normal Mediolateral laxity: stable Anterior drawer test:negative Lachman`s test: negative McMurray`s test: positive Atrophy: No significant atrophy.  Quadriceps tone was fair to good. Range of Motion: Greater than 120 degrees  Neurologic:  Awake, alert, and oriented.  Sensory function is intact to pinprick and light touch.  Motor strength is judged to be 5/5.  Motor coordination is within normal limits.  No apparent clonus. No tremor.   Radiographs: I ordered and interpreted standing AP, lateral, and sunrise radiographs of the right knee that were obtained in the office today. There is mild narrowing of the medial cartilage spacet. Early osteophyte formation is noted. No evidence of fracture or dislocation.   MRI: I reviewed the right knee MRI from [facility] dated 05/21/2021. I concur with the radiologist's interpretation as below:  MRI OF THE RIGHT KNEE WITHOUT CONTRAST   TECHNIQUE:  Multiplanar, multisequence MR imaging of the knee was performed.  No  intravenous contrast was administered.   COMPARISON: None.   FINDINGS:  MENISCI   Medial meniscus: There is a complete, nondisplaced radial tear  through the root of the posterior horn. More peripherally in the  posterior horn, a horizontal tear reaches the meniscal undersurface  and extends into the posterior body.   Lateral meniscus: Intact.   LIGAMENTS   Cruciates: Intact.   Collaterals: Intact.   CARTILAGE   Patellofemoral: Thinned without focal defect.   Medial: Thinned with associated mild joint space narrowing.   Lateral: Minimally degenerated.   Joint: Small effusion.   Popliteal Fossa: Baker's cyst measures approximately 2.2 cm AP x 1  cm transverse x 5 cm craniocaudal.   Extensor Mechanism: Intact.   Bones: No fracture or focal lesion. Small osteophytes are seen about  the knee. There is mild subchondral edema subjacent to the medial  tibial eminence.   Other: None.   IMPRESSION:  Complete, nondisplaced radial tear root of the posterior horn of the  medial meniscus. More peripherally in the posterior horn, a  horizontal tear reaches the meniscal undersurface and extends into  the posterior body of the medial meniscus.   Mild osteoarthritis about the knee.   Baker's cyst.   Electronically Signed  By: Inge Rise M.D.  On: 05/21/2020 15:25  Impression: Internal derangement of the right knee  Plan:  The findings were discussed in detail with the patient. The patient was given informational material on knee arthroscopy. Conservative treatment options were reviewed with the patient. We discussed the risks and benefits of surgical intervention. The usual perioperative course was also discussed in detail. Arthroscopy is an appropriate treatment for the meniscal pathology, but would have limited or no effect on degenerative changes of the articular cartilage. The patient expressed understanding of the risks and benefits of surgical  intervention and would like to proceed with plans for right knee arthroscopy.  MEDICAL CLEARANCE: Per anesthesiology. ACTIVITIES:  Avoid pivoting, squatting, or twisting. WORK STATUS: Anticipate out of work for 2-3 weeks. THERAPY: Quadriceps strengthening exercises. MEDICATIONS: Requested Prescriptions   No prescriptions requested or ordered in this encounter   FOLLOW-UP: Return for preop History & Physical pending surgery date.   Kalani Baray P. Holley Bouche., M.D.  This note was generated in part with voice recognition software and I apologize for any typographical errors that were not detected and corrected.  Electronically signed by Lamar Benes., MD at 09/21/2020 6:10 PM EDT

## 2020-10-06 ENCOUNTER — Other Ambulatory Visit
Admission: RE | Admit: 2020-10-06 | Discharge: 2020-10-06 | Disposition: A | Discharge status: Home / Self Care | Payer: Medicare Other | Source: Ambulatory Visit | Attending: Orthopedic Surgery | Admitting: Orthopedic Surgery

## 2020-10-06 ENCOUNTER — Other Ambulatory Visit: Payer: Self-pay

## 2020-10-06 DIAGNOSIS — Z20822 Contact with and (suspected) exposure to covid-19: Secondary | ICD-10-CM | POA: Insufficient documentation

## 2020-10-06 DIAGNOSIS — Z01812 Encounter for preprocedural laboratory examination: Secondary | ICD-10-CM | POA: Diagnosis present

## 2020-10-06 LAB — BASIC METABOLIC PANEL WITH GFR
Anion gap: 7 (ref 5–15)
BUN: 16 mg/dL (ref 8–23)
CO2: 27 mmol/L (ref 22–32)
Calcium: 9.6 mg/dL (ref 8.9–10.3)
Chloride: 104 mmol/L (ref 98–111)
Creatinine, Ser: 1.01 mg/dL — ABNORMAL HIGH (ref 0.44–1.00)
GFR, Estimated: 57 mL/min — ABNORMAL LOW
Glucose, Bld: 119 mg/dL — ABNORMAL HIGH (ref 70–99)
Potassium: 3.8 mmol/L (ref 3.5–5.1)
Sodium: 138 mmol/L (ref 135–145)

## 2020-10-06 LAB — SARS CORONAVIRUS 2 (TAT 6-24 HRS): SARS Coronavirus 2: NEGATIVE

## 2020-10-06 LAB — CBC
HCT: 43.4 % (ref 36.0–46.0)
Hemoglobin: 14.6 g/dL (ref 12.0–15.0)
MCH: 30.6 pg (ref 26.0–34.0)
MCHC: 33.6 g/dL (ref 30.0–36.0)
MCV: 91 fL (ref 80.0–100.0)
Platelets: 381 10*3/uL (ref 150–400)
RBC: 4.77 MIL/uL (ref 3.87–5.11)
RDW: 13.2 % (ref 11.5–15.5)
WBC: 5.8 10*3/uL (ref 4.0–10.5)
nRBC: 0 % (ref 0.0–0.2)

## 2020-10-06 NOTE — Chronic Care Management (AMB) (Signed)
Error. Please disregard

## 2020-10-06 NOTE — Telephone Encounter (Signed)
Patient in office to check on received vm.    Patient will be available after 430 for call back.   Patient reports she was instructed to hold eliquis and last dose was Saturday night 10/04/20.  Patient denies any cardiology issues at this time.

## 2020-10-06 NOTE — Telephone Encounter (Signed)
Called and spoke to patient. Again, confirmed the last day she took Eliquis was Saturday 4/9 (3 days prior to procedure), per pharmacy recommendations. In review of notes, there was possibly a confusion, since Dr. Fletcher Anon recommended to hold it only 2 days prior to procedure.  Patient is scheduled for 4/13. At this point we will continue to hold Eliquis and I will route this to Dr. Fletcher Anon to let him know. Instructed Eliquis should be restarted post-procedure as as possible.

## 2020-10-07 ENCOUNTER — Encounter: Payer: Self-pay | Admitting: Orthopedic Surgery

## 2020-10-07 NOTE — Telephone Encounter (Signed)
   Patient Name: [name]  DOB: 02-09-1941  MRN: [id number]   Primary Cardiologist: Kathlyn Sacramento, MD  Chart reviewed as part of pre-operative protocol coverage. Given past medical history and time since last visit, based on ACC/AHA guidelines, [name] would be at acceptable risk for the planned procedure without further cardiovascular testing. Per Dr. Saunders Revel and Dr. Tyrell Antonio recommendations, okay to hold Elquis 2 days prior to surgery. Of note, patient held Eliquis 3 days prior to surgery, and surgery is tomorrow, 10/08/20.  The patient was advised that if she develops new symptoms prior to surgery to contact our office to arrange for a follow-up visit, and she verbalized understanding.  I will route this recommendation to the requesting party via Epic fax function and remove from pre-op pool.  Please call with questions.  Torrey Horseman Ninfa Meeker, PA-C 10/07/2020, 3:25 PM

## 2020-10-07 NOTE — Telephone Encounter (Signed)
Ok to proceed with surgery as planned, which is low risk from a cardiac standpoint.  Recommend apixaban be held for 2 days prior to surgery (as previously recommended by Dr. Fletcher Anon) and restarted as soon as possible after surgery.  Of note, apixaban already on hold in anticipation of surgery tomorrow.  Nelva Bush, MD [facility]

## 2020-10-08 ENCOUNTER — Other Ambulatory Visit: Payer: Self-pay

## 2020-10-08 ENCOUNTER — Ambulatory Visit: Payer: Medicare Other | Admitting: Urgent Care

## 2020-10-08 ENCOUNTER — Encounter: Payer: Self-pay | Admitting: Orthopedic Surgery

## 2020-10-08 ENCOUNTER — Encounter
Admission: RE | Disposition: A | Discharge status: Home / Self Care | Payer: Self-pay | Source: Home / Self Care | Attending: Orthopedic Surgery

## 2020-10-08 ENCOUNTER — Ambulatory Visit
Admission: RE | Admit: 2020-10-08 | Discharge: 2020-10-08 | Disposition: A | Discharge status: Home / Self Care | Payer: Medicare Other | Attending: Orthopedic Surgery | Admitting: Orthopedic Surgery

## 2020-10-08 DIAGNOSIS — Z8673 Personal history of transient ischemic attack (TIA), and cerebral infarction without residual deficits: Secondary | ICD-10-CM | POA: Diagnosis not present

## 2020-10-08 DIAGNOSIS — Z7901 Long term (current) use of anticoagulants: Secondary | ICD-10-CM | POA: Diagnosis not present

## 2020-10-08 DIAGNOSIS — S83231A Complex tear of medial meniscus, current injury, right knee, initial encounter: Secondary | ICD-10-CM | POA: Diagnosis not present

## 2020-10-08 DIAGNOSIS — I340 Nonrheumatic mitral (valve) insufficiency: Secondary | ICD-10-CM | POA: Diagnosis not present

## 2020-10-08 DIAGNOSIS — Z823 Family history of stroke: Secondary | ICD-10-CM | POA: Insufficient documentation

## 2020-10-08 DIAGNOSIS — I480 Paroxysmal atrial fibrillation: Secondary | ICD-10-CM | POA: Insufficient documentation

## 2020-10-08 DIAGNOSIS — M94261 Chondromalacia, right knee: Secondary | ICD-10-CM | POA: Diagnosis not present

## 2020-10-08 DIAGNOSIS — X58XXXA Exposure to other specified factors, initial encounter: Secondary | ICD-10-CM | POA: Diagnosis not present

## 2020-10-08 DIAGNOSIS — Z800 Family history of malignant neoplasm of digestive organs: Secondary | ICD-10-CM | POA: Diagnosis not present

## 2020-10-08 DIAGNOSIS — Z8249 Family history of ischemic heart disease and other diseases of the circulatory system: Secondary | ICD-10-CM | POA: Diagnosis not present

## 2020-10-08 DIAGNOSIS — M23221 Derangement of posterior horn of medial meniscus due to old tear or injury, right knee: Secondary | ICD-10-CM | POA: Diagnosis not present

## 2020-10-08 DIAGNOSIS — I10 Essential (primary) hypertension: Secondary | ICD-10-CM | POA: Insufficient documentation

## 2020-10-08 DIAGNOSIS — Z79899 Other long term (current) drug therapy: Secondary | ICD-10-CM | POA: Insufficient documentation

## 2020-10-08 DIAGNOSIS — Y939 Activity, unspecified: Secondary | ICD-10-CM | POA: Insufficient documentation

## 2020-10-08 DIAGNOSIS — Z880 Allergy status to penicillin: Secondary | ICD-10-CM | POA: Diagnosis not present

## 2020-10-08 DIAGNOSIS — Z825 Family history of asthma and other chronic lower respiratory diseases: Secondary | ICD-10-CM | POA: Diagnosis not present

## 2020-10-08 DIAGNOSIS — Z9889 Other specified postprocedural states: Secondary | ICD-10-CM

## 2020-10-08 DIAGNOSIS — M1711 Unilateral primary osteoarthritis, right knee: Secondary | ICD-10-CM | POA: Diagnosis not present

## 2020-10-08 DIAGNOSIS — M2391 Unspecified internal derangement of right knee: Secondary | ICD-10-CM | POA: Diagnosis present

## 2020-10-08 HISTORY — PX: KNEE ARTHROSCOPY: SHX127

## 2020-10-08 SURGERY — ARTHROSCOPY KNEE
Anesthesia: General | Site: Knee | Laterality: Right

## 2020-10-08 MED ORDER — FENTANYL CITRATE (PF) 100 MCG/2ML IJ SOLN
INTRAMUSCULAR | Status: DC | PRN
  Administered 2020-10-08: 25 ug via INTRAVENOUS
  Administered 2020-10-08: 50 ug via INTRAVENOUS
  Administered 2020-10-08: 25 ug via INTRAVENOUS
  Administered 2020-10-08 (×2): 50 ug via INTRAVENOUS

## 2020-10-08 MED ORDER — MORPHINE SULFATE 4 MG/ML IJ SOLN
INTRAMUSCULAR | Status: DC | PRN
  Administered 2020-10-08: 4 mg via SUBCUTANEOUS

## 2020-10-08 MED ORDER — FENTANYL CITRATE (PF) 100 MCG/2ML IJ SOLN
INTRAMUSCULAR | Status: AC
  Filled 2020-10-08: qty 2

## 2020-10-08 MED ORDER — SODIUM CHLORIDE 0.9 % IV SOLN: INTRAVENOUS | Status: DC

## 2020-10-08 MED ORDER — MORPHINE SULFATE (PF) 2 MG/ML IV SOLN: 0.5000 mg | INTRAVENOUS | Status: DC | PRN

## 2020-10-08 MED ORDER — FENTANYL CITRATE (PF) 100 MCG/2ML IJ SOLN
25.0000 ug | INTRAMUSCULAR | Status: DC | PRN
  Administered 2020-10-08: 25 ug via INTRAVENOUS

## 2020-10-08 MED ORDER — LACTATED RINGERS IV SOLN: INTRAVENOUS | Status: DC

## 2020-10-08 MED ORDER — HYDROCODONE-ACETAMINOPHEN 7.5-325 MG PO TABS
1.0000 | ORAL_TABLET | ORAL | Status: DC | PRN
  Filled 2020-10-08: qty 2

## 2020-10-08 MED ORDER — ONDANSETRON HCL 4 MG PO TABS: 4.0000 mg | ORAL_TABLET | Freq: Four times a day (QID) | ORAL | Status: DC | PRN

## 2020-10-08 MED ORDER — ORAL CARE MOUTH RINSE: 15.0000 mL | Freq: Once | OROMUCOSAL | Status: AC

## 2020-10-08 MED ORDER — HYDROCODONE-ACETAMINOPHEN 5-325 MG PO TABS: 1.0000 | ORAL_TABLET | ORAL | 0 refills | Status: DC | PRN

## 2020-10-08 MED ORDER — ONDANSETRON HCL 4 MG/2ML IJ SOLN
INTRAMUSCULAR | Status: DC | PRN
  Administered 2020-10-08: 4 mg via INTRAVENOUS

## 2020-10-08 MED ORDER — CHLORHEXIDINE GLUCONATE 0.12 % MT SOLN
OROMUCOSAL | Status: AC
  Administered 2020-10-08: 15 mL via OROMUCOSAL
  Filled 2020-10-08: qty 15

## 2020-10-08 MED ORDER — FAMOTIDINE 20 MG PO TABS: 20.0000 mg | ORAL_TABLET | Freq: Once | ORAL | Status: AC

## 2020-10-08 MED ORDER — METOCLOPRAMIDE HCL 10 MG PO TABS: 5.0000 mg | ORAL_TABLET | Freq: Three times a day (TID) | ORAL | Status: DC | PRN

## 2020-10-08 MED ORDER — ONDANSETRON HCL 4 MG/2ML IJ SOLN
4.0000 mg | Freq: Once | INTRAMUSCULAR | Status: AC | PRN
  Administered 2020-10-08: 4 mg via INTRAVENOUS

## 2020-10-08 MED ORDER — MORPHINE SULFATE (PF) 4 MG/ML IV SOLN
INTRAVENOUS | Status: AC
  Filled 2020-10-08: qty 1

## 2020-10-08 MED ORDER — HYDROCODONE-ACETAMINOPHEN 5-325 MG PO TABS
ORAL_TABLET | ORAL | Status: AC
  Administered 2020-10-08: 1 via ORAL
  Filled 2020-10-08: qty 1

## 2020-10-08 MED ORDER — ACETAMINOPHEN 325 MG PO TABS: 325.0000 mg | ORAL_TABLET | Freq: Four times a day (QID) | ORAL | Status: DC | PRN

## 2020-10-08 MED ORDER — FAMOTIDINE 20 MG PO TABS
ORAL_TABLET | ORAL | Status: AC
  Administered 2020-10-08: 20 mg via ORAL
  Filled 2020-10-08: qty 1

## 2020-10-08 MED ORDER — ONDANSETRON HCL 4 MG/2ML IJ SOLN: 4.0000 mg | Freq: Four times a day (QID) | INTRAMUSCULAR | Status: DC | PRN

## 2020-10-08 MED ORDER — ONDANSETRON HCL 4 MG/2ML IJ SOLN
INTRAMUSCULAR | Status: AC
  Filled 2020-10-08: qty 2

## 2020-10-08 MED ORDER — ACETAMINOPHEN 10 MG/ML IV SOLN
INTRAVENOUS | Status: AC
  Filled 2020-10-08: qty 100

## 2020-10-08 MED ORDER — BUPIVACAINE-EPINEPHRINE (PF) 0.25% -1:200000 IJ SOLN
INTRAMUSCULAR | Status: AC
  Filled 2020-10-08: qty 30

## 2020-10-08 MED ORDER — CHLORHEXIDINE GLUCONATE 0.12 % MT SOLN: 15.0000 mL | Freq: Once | OROMUCOSAL | Status: AC

## 2020-10-08 MED ORDER — CELECOXIB 200 MG PO CAPS
ORAL_CAPSULE | ORAL | Status: AC
  Administered 2020-10-08: 400 mg via ORAL
  Filled 2020-10-08: qty 2

## 2020-10-08 MED ORDER — DEXAMETHASONE SODIUM PHOSPHATE 10 MG/ML IJ SOLN
INTRAMUSCULAR | Status: DC | PRN
  Administered 2020-10-08: 10 mg via INTRAVENOUS

## 2020-10-08 MED ORDER — METOCLOPRAMIDE HCL 5 MG/ML IJ SOLN: 5.0000 mg | Freq: Three times a day (TID) | INTRAMUSCULAR | Status: DC | PRN

## 2020-10-08 MED ORDER — FENTANYL CITRATE (PF) 100 MCG/2ML IJ SOLN
INTRAMUSCULAR | Status: AC
  Administered 2020-10-08: 25 ug via INTRAVENOUS
  Filled 2020-10-08: qty 2

## 2020-10-08 MED ORDER — CELECOXIB 200 MG PO CAPS: 400.0000 mg | ORAL_CAPSULE | Freq: Once | ORAL | Status: AC

## 2020-10-08 MED ORDER — PROPOFOL 10 MG/ML IV BOLUS
INTRAVENOUS | Status: DC | PRN
  Administered 2020-10-08: 130 mg via INTRAVENOUS

## 2020-10-08 MED ORDER — LIDOCAINE HCL (CARDIAC) PF 100 MG/5ML IV SOSY
PREFILLED_SYRINGE | INTRAVENOUS | Status: DC | PRN
  Administered 2020-10-08: 100 mg via INTRAVENOUS

## 2020-10-08 MED ORDER — ACETAMINOPHEN 10 MG/ML IV SOLN
INTRAVENOUS | Status: DC | PRN
  Administered 2020-10-08: 1000 mg via INTRAVENOUS

## 2020-10-08 MED ORDER — BUPIVACAINE-EPINEPHRINE 0.25% -1:200000 IJ SOLN
INTRAMUSCULAR | Status: DC | PRN
  Administered 2020-10-08: 5 mL
  Administered 2020-10-08: 25 mL

## 2020-10-08 MED ORDER — HYDROCODONE-ACETAMINOPHEN 5-325 MG PO TABS: 1.0000 | ORAL_TABLET | ORAL | Status: DC | PRN

## 2020-10-08 SURGICAL SUPPLY — 29 items
ADAPTER IRRIG TUBE 2 SPIKE SOL (ADAPTER) ×4
ADPR TBG 2 SPK PMP STRL ASCP (ADAPTER) ×2
BLADE SHAVER 4.5 DBL SERAT CV (CUTTER)
BLADE SHAVER TORPEDO 4X13 (MISCELLANEOUS) ×2
COVER WAND RF STERILE (DRAPES) ×2
CUFF TOURN SGL QUICK 24 (TOURNIQUET CUFF)
CUFF TOURN SGL QUICK 30 (TOURNIQUET CUFF)
CUFF TRNQT CYL 24X4X16.5-23 (TOURNIQUET CUFF)
CUFF TRNQT CYL 30X4X21-28X (TOURNIQUET CUFF)
DRAPE ARTHRO LIMB 89X125 STRL (DRAPES) ×2
DRSG DERMACEA 8X12 NADH (GAUZE/BANDAGES/DRESSINGS) ×2
DURAPREP 26ML APPLICATOR (WOUND CARE) ×4
GAUZE SPONGE 4X4 12PLY STRL (GAUZE/BANDAGES/DRESSINGS) ×2
GLOVE SURG ENC TEXT LTX SZ7.5 (GLOVE) ×2
GLOVE SURG UNDER LTX SZ8 (GLOVE) ×2
GOWN STRL REUS W/ TWL LRG LVL3 (GOWN DISPOSABLE) ×2
GOWN STRL REUS W/TWL LRG LVL3 (GOWN DISPOSABLE) ×4
IV LACTATED RINGER IRRG 3000ML (IV SOLUTION) ×12
IV LR IRRIG 3000ML ARTHROMATIC (IV SOLUTION) ×6
KIT TURNOVER KIT A (KITS) ×2
MANIFOLD NEPTUNE II (INSTRUMENTS) ×4
PACK ARTHROSCOPY KNEE (MISCELLANEOUS) ×2
SET TUBE SUCT SHAVER OUTFL 24K (TUBING) ×2
SET TUBE TIP INTRA-ARTICULAR (MISCELLANEOUS) ×2
SOL PREP PVP 2OZ (MISCELLANEOUS) ×2
SUT ETHILON 3-0 FS-10 30 BLK (SUTURE) ×2
TUBING ARTHRO INFLOW-ONLY STRL (TUBING) ×2
WAND HAND CNTRL MULTIVAC 50 (MISCELLANEOUS) ×2
WRAP KNEE W/COLD PACKS 25.5X14 (SOFTGOODS) ×2

## 2020-10-08 NOTE — Op Note (Signed)
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   ASSISTANT: none  ANESTHESIA: general  ESTIMATED BLOOD LOSS: Minimal  FLUIDS REPLACED: 700 mL of crystalloid  TOURNIQUET TIME: Not used  INDICATIONS FOR SURGERY: [name] is a 80 y.o. year old female who has been seen for complaints of right knee pain. MRI demonstrated findings consistent with meniscal pathology. After discussion of the risks and benefits of surgical intervention, the patient expressed understanding of the risks benefits and agree with plans for right knee arthroscopy.   PROCEDURE IN DETAIL: The patient was brought into the operating room and, after adequate general anesthesia was achieved, a tourniquet was applied to the right thigh and the leg was placed in the leg holder. All bony prominences were well padded. The patient's right knee was cleaned and prepped with alcohol and Duraprep and draped in the usual sterile fashion. A "timeout" was performed as per usual protocol. The anticipated portal sites were injected with 0.25% Marcaine with epinephrine. An anterolateral incision was made and a cannula was inserted. A moderate effusion was evacuated and the knee was distended with fluid using the pump. The scope was advanced down the medial gutter into the medial compartment. Under visualization with the scope, an anteromedial portal was created and a hooked probe was inserted. The medial meniscus was visualized and probed.  There was a complex tear of the posterior horn medial meniscus involving a  vertical tear and as well as a radial tear.  The tear was debrided using meniscal punches and a 4.5 mm incisor shaver.  Final contouring was performed using a 50 degree wand.  The room meniscus was visualized and probed and felt be stable.  The articular cartilage was visualized.  There were grade 3 changes of chondromalacia involving both the medial femoral condyle and medial tibial plateau.  These areas were debrided and contoured using the wand.  The scope was then advanced into the intercondylar notch. The anterior cruciate ligament was visualized and probed and felt to be intact. The scope was removed from the lateral portal and reinserted via the anteromedial portal to better visualize the lateral compartment. The lateral meniscus was visualized and probed.  The lateral meniscus was felt to be intact and stable.  The articular cartilage of the lateral compartment was visualized and noted to be in good condition.  Finally, the scope was advanced so as to visualize the patellofemoral articulation. Good patellar tracking was appreciated.  Articular surface demonstrated some mild chondral changes.  The knee was irrigated with copius amounts of fluid and suctioned dry. The anterolateral portal was re-approximated with #3-0 nylon. A combination of 0.25% Marcaine with epinephrine and 4 mg of Morphine were injected via the scope. The scope was removed and the anteromedial portal was re-approximated with #3-0 nylon. A sterile dressing was applied followed by application of an ice wrap.  The patient tolerated the procedure well and was transported to the PACU in stable condition.  [name]., M.D.

## 2020-10-08 NOTE — Anesthesia Procedure Notes (Signed)
Procedure Name: LMA Insertion Date/Time: 10/08/2020 4:43 PM Performed by: Nelda Marseille, CRNA Pre-anesthesia Checklist: Patient identified, Emergency Drugs available, Suction available, Patient being monitored and Timeout performed Patient Re-evaluated:Patient Re-evaluated prior to induction Oxygen Delivery Method: Circle system utilized Preoxygenation: Pre-oxygenation with 100% oxygen Induction Type: IV induction Ventilation: Mask ventilation without difficulty LMA: LMA inserted LMA Size: 4.0 Tube type: Oral Number of attempts: 1 Placement Confirmation: positive ETCO2 and breath sounds checked- equal and bilateral Tube secured with: Tape Dental Injury: Teeth and Oropharynx as per pre-operative assessment

## 2020-10-08 NOTE — Anesthesia Postprocedure Evaluation (Signed)
Anesthesia Post Note  Patient: [name]  Procedure(s) Performed: ARTHROSCOPY KNEE (Right Knee)  Patient location during evaluation: PACU Anesthesia Type: General Level of consciousness: awake and alert Pain management: pain level controlled Vital Signs Assessment: post-procedure vital signs reviewed and stable Respiratory status: spontaneous breathing, nonlabored ventilation, respiratory function stable and patient connected to nasal cannula oxygen Cardiovascular status: blood pressure returned to baseline and stable Postop Assessment: no apparent nausea or vomiting Anesthetic complications: no   No complications documented.   Last Vitals:  Vitals:   10/08/20 1923 10/08/20 1930  BP:  131/84  Pulse: 82 80  Resp:    Temp:    SpO2: 98% 94%    Last Pain:  Vitals:   10/08/20 1900  TempSrc:   PainSc: 6                  Martha Clan

## 2020-10-08 NOTE — Discharge Instructions (Signed)
AMBULATORY SURGERY  DISCHARGE INSTRUCTIONS   1) The drugs that you were given will stay in your system until tomorrow so for the next 24 hours you should not:  A) Drive an automobile B) Make any legal decisions C) Drink any alcoholic beverage   2) You may resume regular meals tomorrow.  Today it is better to start with liquids and gradually work up to solid foods.  You may eat anything you prefer, but it is better to start with liquids, then soup and crackers, and gradually work up to solid foods.   3) Please notify your doctor immediately if you have any unusual bleeding, trouble breathing, redness and pain at the surgery site, drainage, fever, or pain not relieved by medication. 4)   5) Your post-operative visit with Dr.                                     is: Date:                        Time:    Please call to schedule your post-operative visit.  6) Additional Instructions:       Instructions after Knee Arthroscopy    James P. Holley Bouche., M.D.     Dept. of [facility]  [place], [place]  [address]   Phone: [phone]   Fax: [phone]   DIET: . Drink plenty of non-alcoholic fluids & begin a light diet. [name] Resume your normal diet the day after surgery.  ACTIVITY:  . You may use crutches or a walker with weight-bearing as tolerated, unless instructed otherwise. . You may wean yourself off of the walker or crutches as tolerated.  . Begin doing gentle exercises. Exercising will reduce the pain and swelling, increase motion, and prevent muscle weakness.   . Avoid strenuous activities or athletics for a minimum of 4-6 weeks after arthroscopic surgery. . Do not drive or operate any equipment until instructed.  WOUND CARE:  . Place one to two pillows under the knee the first day or two when sitting or lying.  . Continue to use the ice packs periodically to reduce pain and swelling. . The small incisions in  your knee are closed with nylon stitches. The stitches will be removed in the office. . The bulky dressing may be removed on the second day after surgery. DO NOT TOUCH THE STITCHES. Put a Band-Aid over each stitch. Do NOT use any ointments or creams on the incisions.  . You may bathe or shower after the stitches are removed at the first office visit following surgery.  MEDICATIONS: . Dennis Bast may resume your regular medications. . Please take the pain medication as prescribed. . Do not take pain medication on an empty stomach. . Do not drive or drink alcoholic beverages when taking pain medications.  CALL THE OFFICE FOR: . Temperature above 101 degrees . Excessive bleeding or drainage on the dressing. . Excessive swelling, coldness, or paleness of the toes. . Persistent nausea and vomiting.  FOLLOW-UP:  . You should have an appointment to return to the office in 7-10 days after surgery.      [facility] Department Directory         [email or web address]       [email or web address]          Cardiology  Appointments: [place]  [place] [phone]  Endocrinology  Appointments: [place] [phone]  Gastroenterology  Appointments: [place] [phone]        General Surgery   Appointments: [facility]  Internal Medicine/Family Medicine  Appointments: [place] - [phone] [place] [phone]  Metabolic and Hardy Loss Surgery  Appointments: [facility]        Neurology  Appointments: [place] [phone] [place] - [phone]  Neurosurgery  Appointments: [place]  Obstetrics & Gynecology  Appointments: [place] [phone] [place] - [phone]        Pediatrics  Appointments: [place] [phone] [place] - [phone]  Physiatry  Appointments: [place]  [phone]  Physical Therapy  Appointments: [place] [phone]        Podiatry  Appointments: [place] [phone] [place] - [phone]  Pulmonology  Appointments: [place]  Rheumatology  Appointments: [place] [phone]        [place] Location: [facility]  [place], [place]  [address]  [place] Location: [facility] [address] [place], [place]  [address]  [place] Location: [facility] [address] [place],   [address]

## 2020-10-08 NOTE — H&P (Signed)
The patient has been re-examined, and the chart reviewed, and there have been no interval changes to the documented history and physical.    The risks, benefits, and alternatives have been discussed at length. The patient expressed understanding of the risks benefits and agreed with plans for surgical intervention.  Afua Hoots P. Seif Teichert, Jr. M.D.    

## 2020-10-08 NOTE — Anesthesia Preprocedure Evaluation (Signed)
Anesthesia Evaluation  Patient identified by MRN, date of birth, ID band Patient awake    Reviewed: Allergy & Precautions, H&P , NPO status , Patient's Chart, lab work & pertinent test results, reviewed documented beta blocker date and time   Airway Mallampati: II  TM Distance: >3 FB Neck ROM: full    Dental  (+) Teeth Intact   Pulmonary asthma , sleep apnea ,    Pulmonary exam normal        Cardiovascular Exercise Tolerance: Good hypertension, On Medications negative cardio ROS Normal cardiovascular exam Rate:Normal     Neuro/Psych TIAnegative psych ROS   GI/Hepatic negative GI ROS, Neg liver ROS,   Endo/Other  negative endocrine ROS  Renal/GU negative Renal ROS  negative genitourinary   Musculoskeletal   Abdominal   Peds  Hematology negative hematology ROS (+)   Anesthesia Other Findings   Reproductive/Obstetrics negative OB ROS                             Anesthesia Physical Anesthesia Plan  ASA: III  Anesthesia Plan: General LMA   Post-op Pain Management:    Induction:   PONV Risk Score and Plan:   Airway Management Planned:   Additional Equipment:   Intra-op Plan:   Post-operative Plan:   Informed Consent: I have reviewed the patients History and Physical, chart, labs and discussed the procedure including the risks, benefits and alternatives for the proposed anesthesia with the patient or authorized representative who has indicated his/her understanding and acceptance.       Plan Discussed with: CRNA  Anesthesia Plan Comments:         Anesthesia Quick Evaluation

## 2020-10-08 NOTE — Transfer of Care (Signed)
Immediate Anesthesia Transfer of Care Note  Patient: [name]  Procedure(s) Performed: ARTHROSCOPY KNEE (Right Knee)  Patient Location: PACU  Anesthesia Type:General  Level of Consciousness: sedated  Airway & Oxygen Therapy: Patient Spontanous Breathing and Patient connected to face mask oxygen  Post-op Assessment: Report given to RN and Post -op Vital signs reviewed and stable  Post vital signs: Reviewed and stable  Last Vitals:  Vitals Value Taken Time  BP 130/66 10/08/20 1805  Temp 36.6 C 10/08/20 1805  Pulse 69 10/08/20 1809  Resp 15 10/08/20 1809  SpO2 98 % 10/08/20 1809  Vitals shown include unvalidated device data.  Last Pain:  Vitals:   10/08/20 1805  TempSrc:   PainSc: Asleep         Complications: No complications documented.

## 2020-10-09 ENCOUNTER — Encounter: Payer: Self-pay | Admitting: Orthopedic Surgery

## 2020-10-20 ENCOUNTER — Inpatient Hospital Stay
Admission: EM | Admit: 2020-10-20 | Discharge: 2020-10-24 | DRG: 908 | Disposition: A | Discharge status: Home Health | Payer: Medicare Other | Attending: Orthopedic Surgery | Admitting: Orthopedic Surgery

## 2020-10-20 ENCOUNTER — Other Ambulatory Visit: Payer: Self-pay

## 2020-10-20 ENCOUNTER — Emergency Department: Payer: Medicare Other

## 2020-10-20 DIAGNOSIS — Z823 Family history of stroke: Secondary | ICD-10-CM | POA: Diagnosis not present

## 2020-10-20 DIAGNOSIS — Z888 Allergy status to other drugs, medicaments and biological substances status: Secondary | ICD-10-CM

## 2020-10-20 DIAGNOSIS — Y929 Unspecified place or not applicable: Secondary | ICD-10-CM

## 2020-10-20 DIAGNOSIS — Z8249 Family history of ischemic heart disease and other diseases of the circulatory system: Secondary | ICD-10-CM | POA: Diagnosis not present

## 2020-10-20 DIAGNOSIS — I959 Hypotension, unspecified: Secondary | ICD-10-CM | POA: Diagnosis present

## 2020-10-20 DIAGNOSIS — G4700 Insomnia, unspecified: Secondary | ICD-10-CM | POA: Diagnosis present

## 2020-10-20 DIAGNOSIS — B9689 Other specified bacterial agents as the cause of diseases classified elsewhere: Secondary | ICD-10-CM | POA: Diagnosis present

## 2020-10-20 DIAGNOSIS — Z79899 Other long term (current) drug therapy: Secondary | ICD-10-CM

## 2020-10-20 DIAGNOSIS — T888XXA Other specified complications of surgical and medical care, not elsewhere classified, initial encounter: Principal | ICD-10-CM | POA: Diagnosis present

## 2020-10-20 DIAGNOSIS — G4730 Sleep apnea, unspecified: Secondary | ICD-10-CM | POA: Diagnosis present

## 2020-10-20 DIAGNOSIS — Z825 Family history of asthma and other chronic lower respiratory diseases: Secondary | ICD-10-CM

## 2020-10-20 DIAGNOSIS — R262 Difficulty in walking, not elsewhere classified: Secondary | ICD-10-CM | POA: Diagnosis present

## 2020-10-20 DIAGNOSIS — Z20822 Contact with and (suspected) exposure to covid-19: Secondary | ICD-10-CM | POA: Diagnosis present

## 2020-10-20 DIAGNOSIS — M1711 Unilateral primary osteoarthritis, right knee: Secondary | ICD-10-CM | POA: Diagnosis not present

## 2020-10-20 DIAGNOSIS — Z7901 Long term (current) use of anticoagulants: Secondary | ICD-10-CM | POA: Diagnosis not present

## 2020-10-20 DIAGNOSIS — E7800 Pure hypercholesterolemia, unspecified: Secondary | ICD-10-CM | POA: Diagnosis not present

## 2020-10-20 DIAGNOSIS — M810 Age-related osteoporosis without current pathological fracture: Secondary | ICD-10-CM | POA: Diagnosis not present

## 2020-10-20 DIAGNOSIS — Z880 Allergy status to penicillin: Secondary | ICD-10-CM

## 2020-10-20 DIAGNOSIS — M25461 Effusion, right knee: Secondary | ICD-10-CM | POA: Diagnosis not present

## 2020-10-20 DIAGNOSIS — M00861 Arthritis due to other bacteria, right knee: Secondary | ICD-10-CM | POA: Diagnosis not present

## 2020-10-20 DIAGNOSIS — I480 Paroxysmal atrial fibrillation: Secondary | ICD-10-CM | POA: Diagnosis present

## 2020-10-20 DIAGNOSIS — I10 Essential (primary) hypertension: Secondary | ICD-10-CM | POA: Diagnosis not present

## 2020-10-20 DIAGNOSIS — M25561 Pain in right knee: Secondary | ICD-10-CM | POA: Diagnosis not present

## 2020-10-20 DIAGNOSIS — M542 Cervicalgia: Secondary | ICD-10-CM | POA: Diagnosis not present

## 2020-10-20 DIAGNOSIS — Z9841 Cataract extraction status, right eye: Secondary | ICD-10-CM

## 2020-10-20 DIAGNOSIS — M009 Pyogenic arthritis, unspecified: Secondary | ICD-10-CM | POA: Diagnosis present

## 2020-10-20 DIAGNOSIS — M7989 Other specified soft tissue disorders: Secondary | ICD-10-CM | POA: Diagnosis not present

## 2020-10-20 DIAGNOSIS — Z8673 Personal history of transient ischemic attack (TIA), and cerebral infarction without residual deficits: Secondary | ICD-10-CM | POA: Diagnosis not present

## 2020-10-20 DIAGNOSIS — Y838 Other surgical procedures as the cause of abnormal reaction of the patient, or of later complication, without mention of misadventure at the time of the procedure: Secondary | ICD-10-CM | POA: Diagnosis present

## 2020-10-20 LAB — BASIC METABOLIC PANEL WITH GFR
Anion gap: 9 (ref 5–15)
BUN: 20 mg/dL (ref 8–23)
CO2: 22 mmol/L (ref 22–32)
Calcium: 8.6 mg/dL — ABNORMAL LOW (ref 8.9–10.3)
Chloride: 103 mmol/L (ref 98–111)
Creatinine, Ser: 0.99 mg/dL (ref 0.44–1.00)
GFR, Estimated: 59 mL/min — ABNORMAL LOW
Glucose, Bld: 147 mg/dL — ABNORMAL HIGH (ref 70–99)
Potassium: 3.7 mmol/L (ref 3.5–5.1)
Sodium: 134 mmol/L — ABNORMAL LOW (ref 135–145)

## 2020-10-20 LAB — CBC WITH DIFFERENTIAL/PLATELET
Abs Immature Granulocytes: 0.04 10*3/uL (ref 0.00–0.07)
Basophils Absolute: 0 10*3/uL (ref 0.0–0.1)
Basophils Relative: 0 %
Eosinophils Absolute: 0 10*3/uL (ref 0.0–0.5)
Eosinophils Relative: 0 %
HCT: 37 % (ref 36.0–46.0)
Hemoglobin: 12.6 g/dL (ref 12.0–15.0)
Immature Granulocytes: 0 %
Lymphocytes Relative: 8 %
Lymphs Abs: 1 10*3/uL (ref 0.7–4.0)
MCH: 31 pg (ref 26.0–34.0)
MCHC: 34.1 g/dL (ref 30.0–36.0)
MCV: 91.1 fL (ref 80.0–100.0)
Monocytes Absolute: 1.4 10*3/uL — ABNORMAL HIGH (ref 0.1–1.0)
Monocytes Relative: 12 %
Neutro Abs: 9.5 10*3/uL — ABNORMAL HIGH (ref 1.7–7.7)
Neutrophils Relative %: 80 %
Platelets: 290 10*3/uL (ref 150–400)
RBC: 4.06 MIL/uL (ref 3.87–5.11)
RDW: 12.9 % (ref 11.5–15.5)
WBC: 11.9 10*3/uL — ABNORMAL HIGH (ref 4.0–10.5)
nRBC: 0 % (ref 0.0–0.2)

## 2020-10-20 LAB — LACTIC ACID, PLASMA: Lactic Acid, Venous: 0.8 mmol/L (ref 0.5–1.9)

## 2020-10-20 LAB — SEDIMENTATION RATE: Sed Rate: 89 mm/h — ABNORMAL HIGH (ref 0–30)

## 2020-10-20 MED ORDER — ONDANSETRON HCL 4 MG/2ML IJ SOLN
4.0000 mg | Freq: Once | INTRAMUSCULAR | Status: AC
  Administered 2020-10-20: 4 mg via INTRAVENOUS
  Filled 2020-10-20: qty 2

## 2020-10-20 MED ORDER — SODIUM CHLORIDE 0.9 % IV BOLUS
1000.0000 mL | Freq: Once | INTRAVENOUS | Status: AC
  Administered 2020-10-20: 1000 mL via INTRAVENOUS

## 2020-10-20 MED ORDER — MORPHINE SULFATE (PF) 4 MG/ML IV SOLN
4.0000 mg | Freq: Once | INTRAVENOUS | Status: AC
  Administered 2020-10-20: 4 mg via INTRAVENOUS
  Filled 2020-10-20: qty 1

## 2020-10-20 MED ORDER — LIDOCAINE-EPINEPHRINE 2 %-1:100000 IJ SOLN
20.0000 mL | Freq: Once | INTRAMUSCULAR | Status: AC
  Administered 2020-10-20: 1 mL via INTRADERMAL
  Filled 2020-10-20: qty 1

## 2020-10-20 NOTE — ED Triage Notes (Signed)
Pt had arthroscopy of right knee 4/13 to fix tear states over the weekend started having increased pain and swelling to right knee and calf. No redness noted at this time.

## 2020-10-20 NOTE — ED Provider Notes (Signed)
[facility] Emergency Department Provider Note  ____________________________________________   Event Date/Time   First MD Initiated Contact with Patient 10/20/20 2206     (approximate)  I have reviewed the triage vital signs and the nursing notes.   HISTORY  Chief Complaint Knee Pain    HPI [name] is a 80 y.o. female  S/p recent meniscal repair with Dr. Marry Guan on 4/13 here with severe r knee pain. Pt had been recovering well from her procedure, states that her pain was improving and she was ambulating w/o difficulty. However, over the past 2 days, she has had acute worsening of R knee pain along with effusion, warmth, and redness. She has had exquisite pain with any pROM and has been unable to walk. No specific new trauma. Wounds have been clean, no drainage. No alleviating factors. No known fevers.        Past Medical History:  Diagnosis Date  . History of stress test    a. 02/2013 Nl stress test.  . Hypertension   . Insomnia, unspecified   . Mitral regurgitation    a. echo 01/2016: nl LV sys fxn, mild MR, w/o pulm htn, nl atrial size  . Osteoporosis, unspecified   . Paroxysmal atrial fibrillation ([place]) 01/2016   a. diagnosed 01/2016; b. has been on eliquis, pradaxa, and xarelto at varying times 2/2 cost of each medication-->currently on eliquis (11/2016)  . Pure hypercholesterolemia   . Sleep apnea    Unable to tolerate nocturnal PAP therapy  . TIA (transient ischemic attack)   . Unspecified asthma(493.90)   . Vertigo     Patient Active Problem List   Diagnosis Date Noted  . Osteoarthritis of right knee 05/05/2020  . History of colonic polyps   . Polyp of transverse colon   . Aneurysm ([place]) 02/08/2018  . History of CVA (cerebrovascular accident) without residual deficits 01/24/2018  . 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 TIA (transient ischemic attack) 01/22/2018  . Impingement syndrome of shoulder region 01/02/2018  .  History of cataract surgery, right 09/01/2016  . Paroxysmal atrial fibrillation ([place]) 02/24/2016  . Vertigo 02/24/2016  . Low HDL (under 40) 02/17/2016  . Dizziness 02/15/2016  . Hx of colonic polyps   . Benign neoplasm of sigmoid colon   . Asthma, mild intermittent 02/21/2015  . Hyperglycemia 02/21/2015  . Essential hypertension 02/20/2015  . Inconclusive mammogram due to dense breasts 01/02/2015  . Dense breast 01/02/2015  . Abnormal EKG 02/23/2013    Past Surgical History:  Procedure Laterality Date  . ABDOMINAL HYSTERECTOMY    . CATARACT EXTRACTION Right 08/31/2016  . COLONOSCOPY WITH PROPOFOL N/A 03/04/2015   Procedure: COLONOSCOPY WITH PROPOFOL;  Surgeon: Lucilla Lame, MD;  Location: [facility] ENDOSCOPY;  Service: Endoscopy;  Laterality: N/A;  . COLONOSCOPY WITH PROPOFOL N/A 04/01/2020   Procedure: COLONOSCOPY WITH PROPOFOL;  Surgeon: Lucilla Lame, MD;  Location: [facility] ENDOSCOPY;  Service: Endoscopy;  Laterality: N/A;  . DIAGNOSTIC LAPAROSCOPY     REMOVAL OF SCAR TISSUE (ADHESIONS)  . KNEE ARTHROSCOPY Right 10/08/2020   Procedure: ARTHROSCOPY KNEE;  Surgeon: Dereck Leep, MD;  Location: [facility] ORS;  Service: Orthopedics;  Laterality: Right;    Prior to Admission medications   Medication Sig Start Date End Date Taking? Authorizing Provider  albuterol (VENTOLIN HFA) 108 (90 Base) MCG/ACT inhaler Inhale 2 puffs into the lungs every 6 (six) hours as needed for wheezing or shortness of breath. 08/25/20   Steele Sizer, MD  amLODipine ([place]zn[place])  5 MG tablet Take 1 tablet (5 mg total) by mouth in the morning and at bedtime. Hold evening dose if bp below 115/75 08/25/20   Steele Sizer, MD  apixaban (ELIQUIS) 5 MG TABS tablet TAKE 1 TABLET(5 MG) BY MOUTH TWICE DAILY 09/29/20   Wellington Hampshire, MD  diltiazem (CARDIZEM) 30 MG tablet Take 1 tablet (30 mg total) by mouth every 6 (six) hours as needed (for tachycardia/recurrent Afib.). 02/16/16   Theora Gianotti, NP   HYDROcodone-acetaminophen (NORCO) 5-325 MG tablet Take 1-2 tablets by mouth every 4 (four) hours as needed for moderate pain. 10/08/20   Hooten, Laurice Record, MD  meclizine (ANTIVERT) 25 MG tablet Take 25 mg by mouth 3 (three) times daily as needed (vertigo/dizziness).    [provider]  ondansetron (ZOFRAN-ODT) 4 MG disintegrating tablet Take 4 mg by mouth every 8 (eight) hours as needed for nausea or vomiting.    [provider]  rosuvastatin (CRESTOR) 10 MG tablet Take 1 tablet (10 mg total) by mouth daily. Patient taking differently: Take 10 mg by mouth at bedtime. 08/25/20   Steele Sizer, MD    Allergies Ace inhibitors and Penicillins  Family History  Problem Relation Age of Onset  . COPD Father   . Hypertension Mother   . Aneurysm Mother   . Hypertension Brother   . Stroke Brother   . Cancer Brother        pancreatic  . Healthy Brother     Social History Social History   Tobacco Use  . Smoking status: Never Smoker  . Smokeless tobacco: Never Used  . Tobacco comment: smoking cessation materials not required  Vaping Use  . Vaping Use: Never used  Substance Use Topics  . Alcohol use: No    Alcohol/week: 0.0 standard drinks  . Drug use: No    Review of Systems  Review of Systems  Constitutional: Negative for chills, fatigue and fever.  HENT: Negative for congestion and sore throat.   Eyes: Negative for visual disturbance.  Respiratory: Negative for cough and shortness of breath.   Cardiovascular: Negative for chest pain.  Gastrointestinal: Negative for abdominal pain, diarrhea, nausea and vomiting.  Genitourinary: Negative for flank pain.  Musculoskeletal: Positive for arthralgias and gait problem. Negative for back pain and neck pain.  Skin: Negative for rash and wound.  Allergic/Immunologic: Negative for immunocompromised state.  Neurological: Negative for weakness and numbness.  Hematological: Does not bruise/bleed easily.      ____________________________________________  PHYSICAL EXAM:      VITAL SIGNS: ED Triage Vitals  Enc Vitals Group     BP 10/20/20 2036 95/81     Pulse Rate 10/20/20 2036 81     Resp 10/20/20 2036 20     Temp 10/20/20 2036 99.2 F (37.3 C)     Temp Source 10/20/20 2036 Oral     SpO2 10/20/20 2036 100 %     Weight 10/20/20 2037 165 lb (74.8 kg)     Height 10/20/20 2037 5\' 3"  (1.6 m)     Head Circumference --      Peak Flow --      Pain Score 10/20/20 2037 9     Pain Loc --      Pain Edu? --      Excl. in [place]? --      Physical Exam Vitals and nursing note reviewed.  Constitutional:      General: She is not in acute distress.    Appearance: She is  well-developed.  HENT:     Head: Normocephalic and atraumatic.  Eyes:     Conjunctiva/sclera: Conjunctivae normal.  Cardiovascular:     Rate and Rhythm: Normal rate and regular rhythm.     Heart sounds: Normal heart sounds. No murmur heard. No friction rub.  Pulmonary:     Effort: Pulmonary effort is normal. No respiratory distress.     Breath sounds: Normal breath sounds. No wheezing or rales.  Abdominal:     General: There is no distension.     Palpations: Abdomen is soft.     Tenderness: There is no abdominal tenderness.  Musculoskeletal:     Cervical back: Neck supple.  Skin:    General: Skin is warm.     Capillary Refill: Capillary refill takes less than 2 seconds.  Neurological:     Mental Status: She is alert and oriented to person, place, and time.     Motor: No abnormal muscle tone.      LOWER EXTREMITY EXAM: RIGHT  INSPECTION & PALPATION: Marked swelling and warmth of the R knee, with exquisite pain to any pROM. No erythema. Surgical sites appear c/d/i with no surrounding erythema or breakdown.  SENSORY: sensation is intact to light touch in:  Superficial peroneal nerve distribution (over dorsum of foot) Deep peroneal nerve distribution (over first dorsal web space) Sural nerve distribution (over  lateral aspect 5th metatarsal) Saphenous nerve distribution (over medial instep)  MOTOR:  + Motor EHL (great toe dorsiflexion) + FHL (great toe plantar flexion)  + TA (ankle dorsiflexion)  + GSC (ankle plantar flexion)   ____________________________________________   LABS (all labs ordered are listed, but only abnormal results are displayed)  Labs Reviewed  CBC WITH DIFFERENTIAL/PLATELET - Abnormal; Notable for the following components:      Result Value   WBC 11.9 (*)    Neutro Abs 9.5 (*)    Monocytes Absolute 1.4 (*)    All other components within normal limits  BASIC METABOLIC PANEL - Abnormal; Notable for the following components:   Sodium 134 (*)    Glucose, Bld 147 (*)    Calcium 8.6 (*)    GFR, Estimated 59 (*)    All other components within normal limits  SEDIMENTATION RATE - Abnormal; Notable for the following components:   Sed Rate 89 (*)    All other components within normal limits  GRAM STAIN  CULTURE, BLOOD (SINGLE)  CULTURE, BLOOD (SINGLE) W REFLEX TO ID PANEL  BODY FLUID CULTURE W GRAM STAIN  LACTIC ACID, PLASMA  LACTIC ACID, PLASMA  C-REACTIVE PROTEIN  GLUCOSE, BODY FLUID OTHER  PROTEIN, BODY FLUID (OTHER)  SYNOVIAL CELL COUNT + DIFF, W/ CRYSTALS    ____________________________________________  EKG:  ________________________________________  RADIOLOGY All imaging, including plain films, CT scans, and ultrasounds, independently reviewed by me, and interpretations confirmed via formal radiology reads.  ED MD interpretation:   XR Knee: Large R knee effusion DVT Study R leg: Negative  Official radiology report(s): DG Knee 2 Views Right  Result Date: 10/20/2020 CLINICAL DATA:  Increasing pain and swelling to the right knee. Arthroscopy on 10/08/2020. EXAM: RIGHT KNEE - 1-2 VIEW COMPARISON:  None. FINDINGS: Mild degenerative changes with medial compartment narrowing and small osteophyte formation. No evidence of acute fracture or  dislocation. No focal bone lesion or bone destruction. Large right knee effusion. IMPRESSION: Mild degenerative changes in the medial compartment. Large effusion. No acute bony abnormalities. Electronically Signed   By: Lucienne Capers M.D.   On: 10/20/2020  22:52   US Venous Img Lower Unilateral Right  Result Date: 10/20/2020 CLINICAL DATA:  Recent right knee arthroscopy with swelling for 3 days. Anticoagulation therapy. EXAM: Right LOWER EXTREMITY VENOUS DOPPLER ULTRASOUND TECHNIQUE: Gray-scale sonography with compression, as well as color and duplex ultrasound, were performed to evaluate the deep venous system(s) from the level of the common femoral vein through the popliteal and proximal calf veins. COMPARISON:  None. FINDINGS: VENOUS Normal compressibility of the common femoral, superficial femoral, and popliteal veins, as well as the visualized calf veins. Visualized portions of profunda femoral vein and great saphenous vein unremarkable. No filling defects to suggest DVT on grayscale or color Doppler imaging. Doppler waveforms show normal direction of venous flow, normal respiratory plasticity and response to augmentation. Limited views of the contralateral common femoral vein are unremarkable. OTHER None. Limitations: none IMPRESSION: No evidence of deep venous thrombosis in the visualized right lower extremity veins. Electronically Signed   By: Lucienne Capers M.D.   On: 10/20/2020 22:09    ____________________________________________  PROCEDURES   Procedure(s) performed (including Critical Care):  .Joint Aspiration/Arthrocentesis  Date/Time: 10/21/2020 1:53 AM Performed by: Duffy Bruce, MD Authorized by: Duffy Bruce, MD   Consent:    Consent obtained:  Verbal   Consent given by:  Patient   Risks, benefits, and alternatives were discussed: yes     Risks discussed:  Bleeding, incomplete drainage, infection, nerve damage, pain and poor cosmetic result   Alternatives discussed:   Alternative treatment Universal protocol:    Procedure explained and questions answered to patient or proxy's satisfaction: yes     Imaging studies available: yes   Location:    Location: Right knee. Anesthesia:    Anesthesia method:  Local infiltration   Local anesthetic:  Lidocaine 2% WITH epi Procedure details:    Needle gauge:  18 G   Approach:  Anterior   Aspirate amount:  60   Aspirate characteristics:  Cloudy and yellow   Steroid injected: no     Specimen collected: yes   Post-procedure details:    Dressing:  Sterile dressing   Procedure completion:  Tolerated well, no immediate complications    ____________________________________________  INITIAL IMPRESSION / MDM / ASSESSMENT AND PLAN / ED COURSE  As part of my medical decision making, I reviewed the following data within the [place] notes reviewed and incorporated, Old chart reviewed, Notes from prior ED visits, and [place] Controlled Substance Database       *TALISA PETRAK was evaluated in Emergency Department on 10/21/2020 for the symptoms described in the history of present illness. She was evaluated in the context of the global COVID-19 pandemic, which necessitated consideration that the patient might be at risk for infection with the SARS-CoV-2 virus that causes COVID-19. Institutional protocols and algorithms that pertain to the evaluation of patients at risk for COVID-19 are in a state of rapid change based on information released by regulatory bodies including the CDC and federal and state organizations. These policies and algorithms were followed during the patient's care in the ED.  Some ED evaluations and interventions may be delayed as a result of limited staffing during the pandemic.*     Medical Decision Making:  80 yo F here with severe R knee pain, s/p recent arthrocentesis. DDx includes septic arthritis, reactive/inflammatory arthritis, gout, hemarthrosis. After informed  consent, knee aspirated which returned 60-70 cc of cloudy, yellow fluid. Labs thus far show moderate leukocytosis, and pt borderline febrile, hypotensive on arrival.  Will treat empirically for possible septic arthritis, f/u labs. Signed out to Dr. Leonides Schanz.  ____________________________________________  FINAL CLINICAL IMPRESSION(S) / ED DIAGNOSES  Final diagnoses:  Effusion of right knee  Acute pain of right knee     MEDICATIONS GIVEN DURING THIS VISIT:  Medications  vancomycin (VANCOREADY) IVPB 1750 mg/350 mL (1,750 mg Intravenous New Bag/Given 10/21/20 0109)  sodium chloride 0.9 % bolus 1,000 mL (1,000 mLs Intravenous New Bag/Given 10/20/20 2304)  morphine 4 MG/ML injection 4 mg (4 mg Intravenous Given 10/20/20 2320)  ondansetron (ZOFRAN) injection 4 mg (4 mg Intravenous Given 10/20/20 2320)  lidocaine-EPINEPHrine (XYLOCAINE W/EPI) 2 %-1:100000 (with pres) injection 20 mL (1 mL Intradermal Given 10/20/20 2320)  cefTRIAXone (ROCEPHIN) 2 g in sodium chloride 0.9 % 100 mL IVPB (2 g Intravenous New Bag/Given 10/21/20 0024)     ED Discharge Orders    None       Note:  This document was prepared using Dragon voice recognition software and may include unintentional dictation errors.   Duffy Bruce, MD 10/21/20 [phone]

## 2020-10-20 NOTE — ED Provider Notes (Signed)
  Physical Exam  BP 130/66   Pulse 72   Temp 99.2 F (37.3 C) (Oral)   Resp 18   Ht 5\' 3"  (1.6 m)   Wt 74.8 kg   SpO2 98%   BMI 29.23 kg/m   Physical Exam  ED Course/Procedures     Procedures  MDM    12:00 AM  Assumed care.  Patient here with complaints of right knee pain with swelling, warmth.  Temperature here 99.2.  Does have a leukocytosis with left shift.  Large joint effusion seen on x-ray with no acute bony abnormalities.  Did have recent arthroscopic surgery for meniscal injury by Dr. Marry Guan.  Arthrocentesis performed by previous provider with cloudy straw appearing fluid.  Cell counts, gram stain and culture pending.  Inflammatory markers pending.  Patient will need admission.   2:30 AM  Pt's synovial fluid shows no organisms but does show 36,000 white blood cells with predominant neutrophils.  She had received vancomycin and Rocephin.  Will discuss with orthopedics on-call.   2:36 AM  Spoke with Dr. Roland Rack with orthopedics.  He will admit patient for washout.  We will keep her n.p.o.  COVID pending.  Last had her Eliquis dose at 12 PM on 10/20/2020.  Patient and daughter have been updated with plan.   I reviewed all nursing notes and pertinent previous records as available.  I have reviewed and interpreted any EKGs, lab and urine results, imaging (as available).    Carmello Cabiness, Delice Bison, DO 10/21/20 [phone]

## 2020-10-21 ENCOUNTER — Inpatient Hospital Stay: Payer: Medicare Other | Admitting: Anesthesiology

## 2020-10-21 ENCOUNTER — Encounter
Admission: EM | Disposition: A | Discharge status: Home Health | Payer: Self-pay | Source: Home / Self Care | Attending: Orthopedic Surgery

## 2020-10-21 ENCOUNTER — Encounter: Payer: Self-pay | Admitting: Surgery

## 2020-10-21 DIAGNOSIS — T888XXA Other specified complications of surgical and medical care, not elsewhere classified, initial encounter: Secondary | ICD-10-CM | POA: Diagnosis present

## 2020-10-21 DIAGNOSIS — Z79899 Other long term (current) drug therapy: Secondary | ICD-10-CM | POA: Diagnosis not present

## 2020-10-21 DIAGNOSIS — M02861 Other reactive arthropathies, right knee: Secondary | ICD-10-CM | POA: Diagnosis not present

## 2020-10-21 DIAGNOSIS — Z20822 Contact with and (suspected) exposure to covid-19: Secondary | ICD-10-CM | POA: Diagnosis present

## 2020-10-21 DIAGNOSIS — Y838 Other surgical procedures as the cause of abnormal reaction of the patient, or of later complication, without mention of misadventure at the time of the procedure: Secondary | ICD-10-CM | POA: Diagnosis present

## 2020-10-21 DIAGNOSIS — Z880 Allergy status to penicillin: Secondary | ICD-10-CM | POA: Diagnosis not present

## 2020-10-21 DIAGNOSIS — G4700 Insomnia, unspecified: Secondary | ICD-10-CM | POA: Diagnosis present

## 2020-10-21 DIAGNOSIS — Z888 Allergy status to other drugs, medicaments and biological substances status: Secondary | ICD-10-CM | POA: Diagnosis not present

## 2020-10-21 DIAGNOSIS — G4730 Sleep apnea, unspecified: Secondary | ICD-10-CM | POA: Diagnosis present

## 2020-10-21 DIAGNOSIS — M01X61 Direct infection of right knee in infectious and parasitic diseases classified elsewhere: Secondary | ICD-10-CM | POA: Diagnosis not present

## 2020-10-21 DIAGNOSIS — R262 Difficulty in walking, not elsewhere classified: Secondary | ICD-10-CM | POA: Diagnosis present

## 2020-10-21 DIAGNOSIS — Z8673 Personal history of transient ischemic attack (TIA), and cerebral infarction without residual deficits: Secondary | ICD-10-CM | POA: Diagnosis not present

## 2020-10-21 DIAGNOSIS — I480 Paroxysmal atrial fibrillation: Secondary | ICD-10-CM | POA: Diagnosis present

## 2020-10-21 DIAGNOSIS — I959 Hypotension, unspecified: Secondary | ICD-10-CM | POA: Diagnosis present

## 2020-10-21 DIAGNOSIS — Y929 Unspecified place or not applicable: Secondary | ICD-10-CM | POA: Diagnosis not present

## 2020-10-21 DIAGNOSIS — Z9841 Cataract extraction status, right eye: Secondary | ICD-10-CM | POA: Diagnosis not present

## 2020-10-21 DIAGNOSIS — M810 Age-related osteoporosis without current pathological fracture: Secondary | ICD-10-CM | POA: Diagnosis present

## 2020-10-21 DIAGNOSIS — M1711 Unilateral primary osteoarthritis, right knee: Secondary | ICD-10-CM | POA: Diagnosis present

## 2020-10-21 DIAGNOSIS — M25461 Effusion, right knee: Secondary | ICD-10-CM | POA: Diagnosis present

## 2020-10-21 DIAGNOSIS — Z825 Family history of asthma and other chronic lower respiratory diseases: Secondary | ICD-10-CM | POA: Diagnosis not present

## 2020-10-21 DIAGNOSIS — Z8249 Family history of ischemic heart disease and other diseases of the circulatory system: Secondary | ICD-10-CM | POA: Diagnosis not present

## 2020-10-21 DIAGNOSIS — M00861 Arthritis due to other bacteria, right knee: Secondary | ICD-10-CM | POA: Diagnosis present

## 2020-10-21 DIAGNOSIS — Z823 Family history of stroke: Secondary | ICD-10-CM | POA: Diagnosis not present

## 2020-10-21 DIAGNOSIS — I10 Essential (primary) hypertension: Secondary | ICD-10-CM | POA: Diagnosis present

## 2020-10-21 DIAGNOSIS — Z7901 Long term (current) use of anticoagulants: Secondary | ICD-10-CM | POA: Diagnosis not present

## 2020-10-21 DIAGNOSIS — M009 Pyogenic arthritis, unspecified: Secondary | ICD-10-CM | POA: Diagnosis present

## 2020-10-21 DIAGNOSIS — E7800 Pure hypercholesterolemia, unspecified: Secondary | ICD-10-CM | POA: Diagnosis present

## 2020-10-21 DIAGNOSIS — B9689 Other specified bacterial agents as the cause of diseases classified elsewhere: Secondary | ICD-10-CM | POA: Diagnosis present

## 2020-10-21 DIAGNOSIS — M542 Cervicalgia: Secondary | ICD-10-CM | POA: Diagnosis not present

## 2020-10-21 HISTORY — PX: KNEE ARTHROSCOPY: SHX127

## 2020-10-21 LAB — GRAM STAIN

## 2020-10-21 LAB — SYNOVIAL CELL COUNT + DIFF, W/ CRYSTALS
Crystals, Fluid: NONE SEEN
Crystals, Fluid: NONE SEEN
Eosinophils-Synovial: 0 %
Eosinophils-Synovial: 0 %
Lymphocytes-Synovial Fld: 4 %
Lymphocytes-Synovial Fld: 1 %
Monocyte-Macrophage-Synovial Fluid: 1 %
Monocyte-Macrophage-Synovial Fluid: 9 %
Neutrophil, Synovial: 95 %
Neutrophil, Synovial: 90 %
Other Cells-SYN: 0
WBC, Synovial: 32899 /mm3 — ABNORMAL HIGH (ref 0–200)
WBC, Synovial: 20857 /mm3 — ABNORMAL HIGH (ref 0–200)

## 2020-10-21 LAB — RESP PANEL BY RT-PCR (FLU A&B, COVID) ARPGX2
Influenza A by PCR: NEGATIVE
Influenza B by PCR: NEGATIVE
SARS Coronavirus 2 by RT PCR: NEGATIVE

## 2020-10-21 LAB — C-REACTIVE PROTEIN: CRP: 16.4 mg/dL — ABNORMAL HIGH

## 2020-10-21 SURGERY — ARTHROSCOPY KNEE
Anesthesia: General | Site: Knee | Laterality: Right

## 2020-10-21 MED ORDER — FENTANYL CITRATE (PF) 100 MCG/2ML IJ SOLN
INTRAMUSCULAR | Status: AC
  Administered 2020-10-21: 25 ug via INTRAVENOUS
  Filled 2020-10-21: qty 2

## 2020-10-21 MED ORDER — LIDOCAINE HCL (CARDIAC) PF 100 MG/5ML IV SOSY
PREFILLED_SYRINGE | INTRAVENOUS | Status: DC | PRN
  Administered 2020-10-21: 40 mg via INTRAVENOUS
  Administered 2020-10-21: 60 mg via INTRAVENOUS

## 2020-10-21 MED ORDER — APIXABAN 5 MG PO TABS
5.0000 mg | ORAL_TABLET | Freq: Two times a day (BID) | ORAL | Status: DC
  Administered 2020-10-22 – 2020-10-24 (×5): 5 mg via ORAL
  Filled 2020-10-21 (×5): qty 1

## 2020-10-21 MED ORDER — LIDOCAINE HCL (PF) 2 % IJ SOLN
INTRAMUSCULAR | Status: AC
  Filled 2020-10-21: qty 5

## 2020-10-21 MED ORDER — ONDANSETRON HCL 4 MG/2ML IJ SOLN: 4.0000 mg | Freq: Four times a day (QID) | INTRAMUSCULAR | Status: DC | PRN

## 2020-10-21 MED ORDER — MAGNESIUM HYDROXIDE 400 MG/5ML PO SUSP: 30.0000 mL | Freq: Every day | ORAL | Status: DC | PRN

## 2020-10-21 MED ORDER — AMLODIPINE BESYLATE 5 MG PO TABS
5.0000 mg | ORAL_TABLET | Freq: Two times a day (BID) | ORAL | Status: DC
  Administered 2020-10-21 – 2020-10-24 (×6): 5 mg via ORAL
  Filled 2020-10-21 (×6): qty 1

## 2020-10-21 MED ORDER — BISACODYL 10 MG RE SUPP
10.0000 mg | Freq: Every day | RECTAL | Status: DC | PRN
  Filled 2020-10-21: qty 1

## 2020-10-21 MED ORDER — HYDROMORPHONE HCL 1 MG/ML IJ SOLN: 0.2500 mg | INTRAMUSCULAR | Status: DC | PRN

## 2020-10-21 MED ORDER — FENTANYL CITRATE (PF) 100 MCG/2ML IJ SOLN
25.0000 ug | INTRAMUSCULAR | Status: DC | PRN
  Administered 2020-10-21 (×3): 25 ug via INTRAVENOUS

## 2020-10-21 MED ORDER — DILTIAZEM HCL 30 MG PO TABS: 30.0000 mg | ORAL_TABLET | Freq: Four times a day (QID) | ORAL | Status: DC | PRN

## 2020-10-21 MED ORDER — ONDANSETRON HCL 4 MG PO TABS: 4.0000 mg | ORAL_TABLET | Freq: Four times a day (QID) | ORAL | Status: DC | PRN

## 2020-10-21 MED ORDER — HYDROCODONE-ACETAMINOPHEN 7.5-325 MG PO TABS: 1.0000 | ORAL_TABLET | ORAL | Status: DC | PRN

## 2020-10-21 MED ORDER — MORPHINE SULFATE (PF) 4 MG/ML IV SOLN
INTRAVENOUS | Status: AC
  Filled 2020-10-21: qty 1

## 2020-10-21 MED ORDER — PROPOFOL 10 MG/ML IV BOLUS
INTRAVENOUS | Status: AC
  Filled 2020-10-21: qty 40

## 2020-10-21 MED ORDER — METOCLOPRAMIDE HCL 5 MG/ML IJ SOLN: 5.0000 mg | Freq: Three times a day (TID) | INTRAMUSCULAR | Status: DC | PRN

## 2020-10-21 MED ORDER — ACETAMINOPHEN 325 MG PO TABS
325.0000 mg | ORAL_TABLET | Freq: Four times a day (QID) | ORAL | Status: DC | PRN
  Administered 2020-10-23 – 2020-10-24 (×2): 650 mg via ORAL
  Filled 2020-10-21 (×2): qty 2

## 2020-10-21 MED ORDER — METOCLOPRAMIDE HCL 10 MG PO TABS: 5.0000 mg | ORAL_TABLET | Freq: Three times a day (TID) | ORAL | Status: DC | PRN

## 2020-10-21 MED ORDER — MECLIZINE HCL 25 MG PO TABS
25.0000 mg | ORAL_TABLET | Freq: Three times a day (TID) | ORAL | Status: DC | PRN
  Filled 2020-10-21: qty 1

## 2020-10-21 MED ORDER — VANCOMYCIN HCL 1000 MG/200ML IV SOLN
1000.0000 mg | INTRAVENOUS | Status: DC
  Administered 2020-10-22 – 2020-10-24 (×3): 1000 mg via INTRAVENOUS
  Filled 2020-10-21 (×4): qty 200

## 2020-10-21 MED ORDER — ALBUTEROL SULFATE HFA 108 (90 BASE) MCG/ACT IN AERS
2.0000 | INHALATION_SPRAY | Freq: Four times a day (QID) | RESPIRATORY_TRACT | Status: DC | PRN
  Filled 2020-10-21: qty 6.7

## 2020-10-21 MED ORDER — DEXMEDETOMIDINE (PRECEDEX) IN NS 20 MCG/5ML (4 MCG/ML) IV SYRINGE
PREFILLED_SYRINGE | INTRAVENOUS | Status: DC | PRN
  Administered 2020-10-21 (×2): 4 ug via INTRAVENOUS
  Administered 2020-10-21: 8 ug via INTRAVENOUS
  Administered 2020-10-21: 4 ug via INTRAVENOUS

## 2020-10-21 MED ORDER — ONDANSETRON HCL 4 MG/2ML IJ SOLN
INTRAMUSCULAR | Status: DC | PRN
  Administered 2020-10-21: 4 mg via INTRAVENOUS

## 2020-10-21 MED ORDER — PROPOFOL 10 MG/ML IV BOLUS
INTRAVENOUS | Status: AC
  Filled 2020-10-21: qty 20

## 2020-10-21 MED ORDER — DEXAMETHASONE SODIUM PHOSPHATE 10 MG/ML IJ SOLN
INTRAMUSCULAR | Status: DC | PRN
  Administered 2020-10-21: 5 mg via INTRAVENOUS

## 2020-10-21 MED ORDER — ACETAMINOPHEN 325 MG PO TABS: 650.0000 mg | ORAL_TABLET | Freq: Four times a day (QID) | ORAL | Status: DC | PRN

## 2020-10-21 MED ORDER — SODIUM CHLORIDE 0.9 % IV SOLN: INTRAVENOUS | Status: DC

## 2020-10-21 MED ORDER — DOCUSATE SODIUM 100 MG PO CAPS
100.0000 mg | ORAL_CAPSULE | Freq: Two times a day (BID) | ORAL | Status: DC
  Administered 2020-10-21: 100 mg via ORAL
  Filled 2020-10-21: qty 1

## 2020-10-21 MED ORDER — MENTHOL 3 MG MT LOZG
1.0000 | LOZENGE | OROMUCOSAL | Status: DC | PRN
  Filled 2020-10-21: qty 9

## 2020-10-21 MED ORDER — ACETAMINOPHEN 650 MG RE SUPP: 650.0000 mg | Freq: Four times a day (QID) | RECTAL | Status: DC | PRN

## 2020-10-21 MED ORDER — FENTANYL CITRATE (PF) 100 MCG/2ML IJ SOLN
INTRAMUSCULAR | Status: AC
  Filled 2020-10-21: qty 2

## 2020-10-21 MED ORDER — ROSUVASTATIN CALCIUM 10 MG PO TABS
10.0000 mg | ORAL_TABLET | Freq: Every day | ORAL | Status: DC
  Administered 2020-10-22 – 2020-10-24 (×3): 10 mg via ORAL
  Filled 2020-10-21 (×4): qty 1

## 2020-10-21 MED ORDER — ALBUTEROL SULFATE (2.5 MG/3ML) 0.083% IN NEBU: 2.5000 mg | INHALATION_SOLUTION | Freq: Four times a day (QID) | RESPIRATORY_TRACT | Status: DC | PRN

## 2020-10-21 MED ORDER — VANCOMYCIN HCL IN DEXTROSE 1-5 GM/200ML-% IV SOLN
1000.0000 mg | INTRAVENOUS | Status: DC
  Filled 2020-10-21: qty 200

## 2020-10-21 MED ORDER — ACETAMINOPHEN 10 MG/ML IV SOLN
INTRAVENOUS | Status: AC
  Filled 2020-10-21: qty 100

## 2020-10-21 MED ORDER — METOCLOPRAMIDE HCL 10 MG PO TABS
10.0000 mg | ORAL_TABLET | Freq: Three times a day (TID) | ORAL | Status: AC
  Administered 2020-10-22 – 2020-10-23 (×8): 10 mg via ORAL
  Filled 2020-10-21 (×8): qty 1

## 2020-10-21 MED ORDER — SENNOSIDES-DOCUSATE SODIUM 8.6-50 MG PO TABS
1.0000 | ORAL_TABLET | Freq: Two times a day (BID) | ORAL | Status: DC
  Administered 2020-10-21 – 2020-10-24 (×6): 1 via ORAL
  Filled 2020-10-21 (×6): qty 1

## 2020-10-21 MED ORDER — HYDROCODONE-ACETAMINOPHEN 5-325 MG PO TABS: 1.0000 | ORAL_TABLET | ORAL | Status: DC | PRN

## 2020-10-21 MED ORDER — FLEET ENEMA 7-19 GM/118ML RE ENEM: 1.0000 | ENEMA | Freq: Once | RECTAL | Status: DC | PRN

## 2020-10-21 MED ORDER — MORPHINE SULFATE (PF) 2 MG/ML IV SOLN: 0.5000 mg | INTRAVENOUS | Status: DC | PRN

## 2020-10-21 MED ORDER — BISACODYL 10 MG RE SUPP: 10.0000 mg | Freq: Every day | RECTAL | Status: DC | PRN

## 2020-10-21 MED ORDER — ONDANSETRON HCL 4 MG/2ML IJ SOLN: 4.0000 mg | Freq: Once | INTRAMUSCULAR | Status: DC | PRN

## 2020-10-21 MED ORDER — DEXAMETHASONE SODIUM PHOSPHATE 10 MG/ML IJ SOLN
INTRAMUSCULAR | Status: AC
  Filled 2020-10-21: qty 1

## 2020-10-21 MED ORDER — BUPIVACAINE-EPINEPHRINE 0.25% -1:200000 IJ SOLN
INTRAMUSCULAR | Status: DC | PRN
  Administered 2020-10-21: 30 mL

## 2020-10-21 MED ORDER — DEXMEDETOMIDINE (PRECEDEX) IN NS 20 MCG/5ML (4 MCG/ML) IV SYRINGE
PREFILLED_SYRINGE | INTRAVENOUS | Status: AC
  Filled 2020-10-21: qty 5

## 2020-10-21 MED ORDER — MORPHINE SULFATE 4 MG/ML IJ SOLN
INTRAMUSCULAR | Status: DC | PRN
  Administered 2020-10-21: 4 mg via SUBCUTANEOUS

## 2020-10-21 MED ORDER — AMLODIPINE BESYLATE 5 MG PO TABS
5.0000 mg | ORAL_TABLET | Freq: Every day | ORAL | Status: DC
  Administered 2020-10-21: 5 mg via ORAL
  Filled 2020-10-21: qty 1

## 2020-10-21 MED ORDER — TRAMADOL HCL 50 MG PO TABS
50.0000 mg | ORAL_TABLET | ORAL | Status: DC | PRN
  Administered 2020-10-22: 100 mg via ORAL
  Administered 2020-10-23: 50 mg via ORAL
  Administered 2020-10-23: 100 mg via ORAL
  Filled 2020-10-21: qty 1
  Filled 2020-10-21 (×2): qty 2

## 2020-10-21 MED ORDER — SODIUM CHLORIDE 0.9 % IV SOLN
2.0000 g | Freq: Once | INTRAVENOUS | Status: AC
  Administered 2020-10-21: 2 g via INTRAVENOUS
  Filled 2020-10-21: qty 20

## 2020-10-21 MED ORDER — HYDROCODONE-ACETAMINOPHEN 5-325 MG PO TABS
1.0000 | ORAL_TABLET | ORAL | Status: DC | PRN
  Administered 2020-10-21: 1 via ORAL
  Filled 2020-10-21 (×2): qty 1

## 2020-10-21 MED ORDER — ACETAMINOPHEN 10 MG/ML IV SOLN
INTRAVENOUS | Status: DC | PRN
  Administered 2020-10-21: 1000 mg via INTRAVENOUS

## 2020-10-21 MED ORDER — PHENOL 1.4 % MT LIQD
1.0000 | OROMUCOSAL | Status: DC | PRN
  Filled 2020-10-21: qty 177

## 2020-10-21 MED ORDER — ONDANSETRON HCL 4 MG/2ML IJ SOLN
INTRAMUSCULAR | Status: AC
  Filled 2020-10-21: qty 2

## 2020-10-21 MED ORDER — EPHEDRINE SULFATE 50 MG/ML IJ SOLN
INTRAMUSCULAR | Status: DC | PRN
  Administered 2020-10-21: 10 mg via INTRAVENOUS

## 2020-10-21 MED ORDER — PROPOFOL 500 MG/50ML IV EMUL
INTRAVENOUS | Status: DC | PRN
  Administered 2020-10-21: 150 ug/kg/min via INTRAVENOUS

## 2020-10-21 MED ORDER — PANTOPRAZOLE SODIUM 40 MG IV SOLR: 40.0000 mg | Freq: Every day | INTRAVENOUS | Status: DC

## 2020-10-21 MED ORDER — PROPOFOL 10 MG/ML IV BOLUS
INTRAVENOUS | Status: DC | PRN
  Administered 2020-10-21: 130 mg via INTRAVENOUS
  Administered 2020-10-21: 80 mg via INTRAVENOUS

## 2020-10-21 MED ORDER — FENTANYL CITRATE (PF) 100 MCG/2ML IJ SOLN
INTRAMUSCULAR | Status: DC | PRN
  Administered 2020-10-21 (×5): 25 ug via INTRAVENOUS

## 2020-10-21 MED ORDER — CELECOXIB 200 MG PO CAPS
200.0000 mg | ORAL_CAPSULE | Freq: Two times a day (BID) | ORAL | Status: DC
  Administered 2020-10-21 – 2020-10-24 (×6): 200 mg via ORAL
  Filled 2020-10-21 (×6): qty 1

## 2020-10-21 MED ORDER — VANCOMYCIN HCL 1750 MG/350ML IV SOLN
1750.0000 mg | Freq: Once | INTRAVENOUS | Status: AC
  Administered 2020-10-21: 1750 mg via INTRAVENOUS
  Filled 2020-10-21: qty 350

## 2020-10-21 MED ORDER — ACETAMINOPHEN 10 MG/ML IV SOLN
1000.0000 mg | Freq: Four times a day (QID) | INTRAVENOUS | Status: AC
  Administered 2020-10-21 – 2020-10-22 (×3): 1000 mg via INTRAVENOUS
  Filled 2020-10-21 (×4): qty 100

## 2020-10-21 SURGICAL SUPPLY — 25 items
ADAPTER IRRIG TUBE 2 SPIKE SOL (ADAPTER) ×4
BLADE SHAVER 4.5 DBL SERAT CV (CUTTER) ×2
CNTNR SPEC 2.5X3XGRAD LEK (MISCELLANEOUS) ×1
CONT SPEC 4OZ STER OR WHT (MISCELLANEOUS) ×1
COVER WAND RF STERILE (DRAPES) ×2
CUFF TOURN SGL QUICK 24 (TOURNIQUET CUFF) ×1
CUFF TRNQT CYL 24X4X16.5-23 (TOURNIQUET CUFF) ×1
DRAPE ARTHRO LIMB 89X125 STRL (DRAPES) ×2
DRSG DERMACEA 8X12 NADH (GAUZE/BANDAGES/DRESSINGS) ×2
DURAPREP 26ML APPLICATOR (WOUND CARE) ×4
GAUZE SPONGE 4X4 12PLY STRL (GAUZE/BANDAGES/DRESSINGS) ×2
GLOVE SURG ENC TEXT LTX SZ7.5 (GLOVE) ×2
GLOVE SURG UNDER LTX SZ8 (GLOVE) ×2
GOWN STRL REUS W/ TWL LRG LVL3 (GOWN DISPOSABLE) ×2
GOWN STRL REUS W/TWL LRG LVL3 (GOWN DISPOSABLE) ×2
IV LACTATED RINGER IRRG 3000ML (IV SOLUTION) ×4
IV LR IRRIG 3000ML ARTHROMATIC (IV SOLUTION) ×4
KIT TURNOVER KIT A (KITS) ×2
MANIFOLD NEPTUNE II (INSTRUMENTS) ×2
PACK ARTHROSCOPY KNEE (MISCELLANEOUS) ×2
SOL PREP PVP 2OZ (MISCELLANEOUS) ×2
SPONGE DRAIN TRACH 4X4 STRL 2S (GAUZE/BANDAGES/DRESSINGS) ×2
SUT ETHILON 3-0 FS-10 30 BLK (SUTURE) ×2
WAND HAND CNTRL MULTIVAC 50 (MISCELLANEOUS) ×2
WRAPON POLAR PAD KNEE (MISCELLANEOUS) ×2

## 2020-10-21 NOTE — ED Notes (Signed)
Patient placed on inpatient hospital bed, provided with gown, informed of plan of care, daughter at bedside

## 2020-10-21 NOTE — Plan of Care (Signed)

## 2020-10-21 NOTE — Progress Notes (Signed)
Pharmacy Antibiotic Note  [name] is a 80 y.o. female admitted on 10/20/2020 with wound infection.  Pharmacy has been consulted for Vancomycin dosing.  Plan: Vancomycin 1750 mg IV X 1 given on 4/26 @ 0100. Vancomycin 1 gm IV Q24H ordered to continue on 4/27 @ 0100.  AUC = 500.8 Vanc trough = 13.4 mcg/mL  Height: 5\' 3"  (160 cm) Weight: 74.8 kg (165 lb) IBW/kg (Calculated) : 52.4  Temp (24hrs), Avg:99.1 F (37.3 C), Min:99 F (37.2 C), Max:99.2 F (37.3 C)  Recent Labs  Lab 10/20/20 2255 10/20/20 2305  WBC 11.9*  --   CREATININE 0.99  --   LATICACIDVEN  --  0.8    Estimated Creatinine Clearance: 46.1 mL/min (by C-G formula based on SCr of 0.99 mg/dL).    Allergies  Allergen Reactions  . Ace Inhibitors Swelling    Patient states her tongue swell.  [name] Penicillins Anxiety    Has patient had a PCN reaction causing immediate rash, facial/tongue/throat swelling, SOB or lightheadedness with hypotension: No Has patient had a PCN reaction causing severe rash involving mucus membranes or skin necrosis: No Has patient had a PCN reaction that required hospitalization No Has patient had a PCN reaction occurring within the last 10 years: No If all of the above answers are "NO", then may proceed with Cephalosporin use.    Antimicrobials this admission:   >>    >>   Dose adjustments this admission:   Microbiology results:  BCx:   UCx:    Sputum:    MRSA PCR:  Thank you for allowing pharmacy to be a part of this patient's care.  Mariluz Crespo D 10/21/2020 5:07 AM

## 2020-10-21 NOTE — Progress Notes (Signed)
Plan of care discussed with patient and daughter at bedside. Pain controlled with PRN medication. Patient alert and oriented and call bell within reach. Dressing CDI polar care and hemovac intact.

## 2020-10-21 NOTE — Transfer of Care (Signed)
Immediate Anesthesia Transfer of Care Note  Patient: [name]  Procedure(s) Performed: Arthroscopic irrigation and debridement right knee (Right Knee)  Patient Location: PACU  Anesthesia Type:General  Level of Consciousness: drowsy and patient cooperative  Airway & Oxygen Therapy: Patient Spontanous Breathing and Patient connected to nasal cannula oxygen  Post-op Assessment: Report given to RN and Post -op Vital signs reviewed and stable  Post vital signs: Reviewed and stable  Last Vitals:  Vitals Value Taken Time  BP 125/63 10/21/20 2000  Temp 36.6 C 10/21/20 2000  Pulse 72 10/21/20 2006  Resp 23 10/21/20 2006  SpO2 97 % 10/21/20 2006  Vitals shown include unvalidated device data.  Last Pain:  Vitals:   10/21/20 2000  TempSrc:   PainSc: Asleep         Complications: No complications documented.

## 2020-10-21 NOTE — Anesthesia Procedure Notes (Signed)
Procedure Name: LMA Insertion Date/Time: 10/21/2020 5:55 PM Performed by: Jonna Clark, CRNA Pre-anesthesia Checklist: Patient identified, Patient being monitored, Timeout performed, Emergency Drugs available and Suction available Patient Re-evaluated:Patient Re-evaluated prior to induction Oxygen Delivery Method: Circle system utilized Preoxygenation: Pre-oxygenation with 100% oxygen Induction Type: IV induction Ventilation: Mask ventilation without difficulty LMA: LMA inserted LMA Size: 4.0 Tube type: Oral Number of attempts: 1 Placement Confirmation: positive ETCO2 and breath sounds checked- equal and bilateral Tube secured with: Tape Dental Injury: Teeth and Oropharynx as per pre-operative assessment

## 2020-10-21 NOTE — Progress Notes (Signed)
PHARMACY -  BRIEF ANTIBIOTIC NOTE   Pharmacy has received consult(s) for Vancomycin from an ED provider.  The patient's profile has been reviewed for ht/wt/allergies/indication/available labs.    One time order(s) placed for Vancomycin 1750 mg IV X 1   Further antibiotics/pharmacy consults should be ordered by admitting physician if indicated.                       Thank you, [name] D 10/21/2020  12:10 AM

## 2020-10-21 NOTE — Op Note (Signed)
OPERATIVE NOTE  DATE OF SURGERY:  10/21/2020  PATIENT NAME:  [name]   DOB: [birth year]  MRN: [id number]   PRE-OPERATIVE DIAGNOSIS: Septic right knee   POST-OPERATIVE DIAGNOSIS: Same (cultures pending)  PROCEDURE:  Right knee arthroscopy, arthroscopic irrigation and debridement of the right knee  SURGEON:  Marciano Sequin., M.D.   ASSISTANT: none  ANESTHESIA: general  ESTIMATED BLOOD LOSS: Minimal  FLUIDS REPLACED: 600 mL of crystalloid  TOURNIQUET TIME: 45 minutes  DRAINS: 1 medium Hemovac  INDICATIONS FOR SURGERY: [name] is a 80 y.o. year old female who underwent right knee arthroscopy, partial medial meniscectomy, and chondroplasty approximately 2 weeks ago.  She had done well initially but had the 3-day history of increasing pain and swelling to the right knee.  Aspiration in the Emergency Department demonstrated findings suggestive of a septic knee.  After discussion of the risks and benefits of surgical intervention, the patient expressed understanding of the risks benefits and agree with plans for right knee arthroscopy.   PROCEDURE IN DETAIL: The patient was brought into the operating room and, after adequate general anesthesia was achieved, a tourniquet was applied to the right thigh and the leg was placed in the leg holder. All bony prominences were well padded. The patient's right knee was cleaned and prepped with alcohol and Duraprep and draped in the usual sterile fashion. A "timeout" was performed as per usual protocol. The anticipated portal sites were injected with 0.25% Marcaine with epinephrine. An anterolateral incision was made and a cannula was inserted. A large effusion (30 mL) was evacuated and the fluid submitted to pathology for stat gram stain, cultures, and sensitivity.  The knee was distended with fluid using the pump. The scope was advanced down the medial gutter into the medial compartment. Under visualization with the scope, an  anteromedial portal was created and a hooked probe was inserted. The medial meniscus was visualized and probed.  The articular cartilage was visualized and noted to have grade III chondromalacia.  A limited synovectomy was performed involving the medial compartment.  The scope was then advanced into the intercondylar notch. The anterior cruciate ligament was visualized and probed and felt to be intact. The scope was removed from the lateral portal and reinserted via the anteromedial portal to better visualize the lateral compartment. The lateral meniscus was visualized and probed.  The lateral meniscus was intact the articular cartilage of the lateral compartment was visualized and noted to be in good condition.  Finally, the scope was advanced so as to visualize the patellofemoral articulation. Good patellar tracking was appreciated.  Some synovial hypertrophy was encountered and a limited synovectomy was performed involving the suprapatellar pouch as well as the medial and lateral gutters.  Under direct visualization with the scope, a superior lateral portal was created and a medium Hemovac drain was advanced through the cannula.  Cannula was then removed.  The knee was irrigated with copius amounts of fluid (a total of 12 L) and suctioned dry. The anterolateral portal was re-approximated with #3-0 nylon. A combination of 0.25% Marcaine with epinephrine and 4 mg of Morphine were injected via the scope. The scope was removed and the anteromedial portal was re-approximated with #3-0 nylon. A sterile dressing was applied followed by application of an ice wrap.  The patient tolerated the procedure well and was transported to the PACU in stable condition.  Jerica Creegan P. Holley Bouche., M.D.

## 2020-10-21 NOTE — ED Notes (Signed)
Report to pre op RN, patient awaiting to go to inpatient bed

## 2020-10-21 NOTE — ED Notes (Addendum)
Per Dr. Marry Guan he will be taking patient into procedure around 5pm. Patient aware.

## 2020-10-21 NOTE — Anesthesia Postprocedure Evaluation (Signed)
Anesthesia Post Note  Patient: [name]  Procedure(s) Performed: Arthroscopic irrigation and debridement right knee (Right Knee)  Patient location during evaluation: PACU Anesthesia Type: General Level of consciousness: awake and alert Pain management: pain level controlled Vital Signs Assessment: post-procedure vital signs reviewed and stable Respiratory status: spontaneous breathing and respiratory function stable Cardiovascular status: stable Anesthetic complications: no   No complications documented.   Last Vitals:  Vitals:   10/21/20 2045 10/21/20 2100  BP: 135/66 136/67  Pulse: 75 72  Resp: 14 13  Temp:  (!) 36.1 C  SpO2: 95% 96%    Last Pain:  Vitals:   10/21/20 2100  TempSrc:   PainSc: Asleep                 [name]

## 2020-10-21 NOTE — Anesthesia Preprocedure Evaluation (Signed)
Anesthesia Evaluation  Patient identified by MRN, date of birth, ID band Patient awake    Reviewed: Allergy & Precautions, H&P , NPO status , Patient's Chart, lab work & pertinent test results  History of Anesthesia Complications (+) PONV and history of anesthetic complications  Airway Mallampati: II       Dental   Pulmonary asthma , sleep apnea and Continuous Positive Airway Pressure Ventilation ,           Cardiovascular Exercise Tolerance: Good hypertension, On Medications negative cardio ROS       Neuro/Psych TIAnegative psych ROS   GI/Hepatic negative GI ROS, Neg liver ROS,   Endo/Other  negative endocrine ROS  Renal/GU negative Renal ROS  negative genitourinary   Musculoskeletal   Abdominal   Peds  Hematology negative hematology ROS (+)   Anesthesia Other Findings   Reproductive/Obstetrics negative OB ROS                             Anesthesia Physical  Anesthesia Plan  ASA: III  Anesthesia Plan: General LMA   Post-op Pain Management:    Induction:   PONV Risk Score and Plan: 4 or greater and Propofol infusion, TIVA and Treatment may vary due to age or medical condition  Airway Management Planned: LMA  Additional Equipment:   Intra-op Plan:   Post-operative Plan:   Informed Consent: I have reviewed the patients History and Physical, chart, labs and discussed the procedure including the risks, benefits and alternatives for the proposed anesthesia with the patient or authorized representative who has indicated his/her understanding and acceptance.       Plan Discussed with:   Anesthesia Plan Comments:         Anesthesia Quick Evaluation

## 2020-10-21 NOTE — H&P (Addendum)
ORTHOPAEDIC HISTORY & PHYSICAL  PATIENT NAME: [name] DOB: 07-16-1940  MRN: [id number]  Chief Complaint: Right knee pain  HPI: [name] is a 80 y.o. female who underwent right knee arthroscopy, partial medial meniscectomy, and chondroplasty on 10/08/2020.  She was recovering well with minimal pain and swelling.  However, last Friday she noted the onset of swelling and increased right knee pain.  She also had some calf pain.  She denied any fevers or chills.  The pain increased in severity and she presented to the Emergency Department for further evaluation.  The right knee was aspirated by the ED physician with a gram stain showing multiple WBCs but no organisms.  The cell count was elevated at 32,899 with 95% neutrophils.  Vancomycin was initiated.  The patient denied any other significant pain or swelling.  She denied any falls or injury to the knee.   Past Medical History:  Diagnosis Date  . History of stress test    a. 02/2013 Nl stress test.  . Hypertension   . Insomnia, unspecified   . Mitral regurgitation    a. echo 01/2016: nl LV sys fxn, mild MR, w/o pulm htn, nl atrial size  . Osteoporosis, unspecified   . Paroxysmal atrial fibrillation ([place]) 01/2016   a. diagnosed 01/2016; b. has been on eliquis, pradaxa, and xarelto at varying times 2/2 cost of each medication-->currently on eliquis (11/2016)  . Pure hypercholesterolemia   . Sleep apnea    Unable to tolerate nocturnal PAP therapy  . TIA (transient ischemic attack)   . Unspecified asthma(493.90)   . Vertigo    Past Surgical History:  Procedure Laterality Date  . ABDOMINAL HYSTERECTOMY    . CATARACT EXTRACTION Right 08/31/2016  . COLONOSCOPY WITH PROPOFOL N/A 03/04/2015   Procedure: COLONOSCOPY WITH PROPOFOL;  Surgeon: Lucilla Lame, MD;  Location: [facility] ENDOSCOPY;  Service: Endoscopy;  Laterality: N/A;  . COLONOSCOPY WITH PROPOFOL N/A 04/01/2020   Procedure: COLONOSCOPY WITH PROPOFOL;  Surgeon: Lucilla Lame, MD;   Location: [facility] ENDOSCOPY;  Service: Endoscopy;  Laterality: N/A;  . DIAGNOSTIC LAPAROSCOPY     REMOVAL OF SCAR TISSUE (ADHESIONS)  . KNEE ARTHROSCOPY Right 10/08/2020   Procedure: ARTHROSCOPY KNEE;  Surgeon: Dereck Leep, MD;  Location: [facility] ORS;  Service: Orthopedics;  Laterality: Right;   Social History   Socioeconomic History  . Marital status: Single    Spouse name: Not on file  . Number of children: 2  . Years of education: Not on file  . Highest education level: Associate degree: academic program  Occupational History  . Occupation: Retired  Tobacco Use  . Smoking status: Never Smoker  . Smokeless tobacco: Never Used  . Tobacco comment: smoking cessation materials not required  Vaping Use  . Vaping Use: Never used  Substance and Sexual Activity  . Alcohol use: No    Alcohol/week: 0.0 standard drinks  . Drug use: No  . Sexual activity: Not Currently    Partners: Male  Other Topics Concern  . Not on file  Social History Narrative   Independent at baseline. Lives alone.    Social Determinants of Health   Financial Resource Strain: Low Risk   . Difficulty of Paying Living Expenses: Not hard at all  Food Insecurity: No Food Insecurity  . Worried About Charity fundraiser in the Last Year: Never true  . Ran Out of Food in the Last Year: Never true  Transportation Needs: No Transportation Needs  . Lack of Transportation (  Medical): No  . Lack of Transportation (Non-Medical): No  Physical Activity: Insufficiently Active  . Days of Exercise per Week: 3 days  . Minutes of Exercise per Session: 30 min  Stress: No Stress Concern Present  . Feeling of Stress : Not at all  Social Connections: Moderately Integrated  . Frequency of Communication with Friends and Family: More than three times a week  . Frequency of Social Gatherings with Friends and Family: More than three times a week  . Attends Religious Services: More than 4 times per year  . Active Member of Clubs or  Organizations: Yes  . Attends Archivist Meetings: More than 4 times per year  . Marital Status: Never married   Family History  Problem Relation Age of Onset  . COPD Father   . Hypertension Mother   . Aneurysm Mother   . Hypertension Brother   . Stroke Brother   . Cancer Brother        pancreatic  . Healthy Brother    Allergies  Allergen Reactions  . Ace Inhibitors Swelling    Patient states her tongue swell.  [name] Penicillins Anxiety    Has patient had a PCN reaction causing immediate rash, facial/tongue/throat swelling, SOB or lightheadedness with hypotension: No Has patient had a PCN reaction causing severe rash involving mucus membranes or skin necrosis: No Has patient had a PCN reaction that required hospitalization No Has patient had a PCN reaction occurring within the last 10 years: No If all of the above answers are "NO", then may proceed with Cephalosporin use.   Prior to Admission medications   Medication Sig Start Date End Date Taking? Authorizing Provider  apixaban (ELIQUIS) 5 MG TABS tablet TAKE 1 TABLET(5 MG) BY MOUTH TWICE DAILY 09/29/20  Yes Wellington Hampshire, MD  albuterol (VENTOLIN HFA) 108 (90 Base) MCG/ACT inhaler Inhale 2 puffs into the lungs every 6 (six) hours as needed for wheezing or shortness of breath. 08/25/20   Steele Sizer, MD  amLODipine (NORVASC) 5 MG tablet Take 1 tablet (5 mg total) by mouth in the morning and at bedtime. Hold evening dose if bp below 115/75 08/25/20   Steele Sizer, MD  diltiazem (CARDIZEM) 30 MG tablet Take 1 tablet (30 mg total) by mouth every 6 (six) hours as needed (for tachycardia/recurrent Afib.). 02/16/16   Theora Gianotti, NP  HYDROcodone-acetaminophen (NORCO) 5-325 MG tablet Take 1-2 tablets by mouth every 4 (four) hours as needed for moderate pain. 10/08/20   Noemi Ishmael, Laurice Record, MD  meclizine (ANTIVERT) 25 MG tablet Take 25 mg by mouth 3 (three) times daily as needed (vertigo/dizziness).    [provider]  ondansetron (ZOFRAN-ODT) 4 MG disintegrating tablet Take 4 mg by mouth every 8 (eight) hours as needed for nausea or vomiting.    [provider]  rosuvastatin (CRESTOR) 10 MG tablet Take 1 tablet (10 mg total) by mouth daily. Patient taking differently: Take 10 mg by mouth at bedtime. 08/25/20   Steele Sizer, MD   DG Knee 2 Views Right  Result Date: 10/20/2020 CLINICAL DATA:  Increasing pain and swelling to the right knee. Arthroscopy on 10/08/2020. EXAM: RIGHT KNEE - 1-2 VIEW COMPARISON:  None. FINDINGS: Mild degenerative changes with medial compartment narrowing and small osteophyte formation. No evidence of acute fracture or dislocation. No focal bone lesion or bone destruction. Large right knee effusion. IMPRESSION: Mild degenerative changes in the medial compartment. Large effusion. No acute bony abnormalities. Electronically Signed   By:  Lucienne Capers M.D.   On: 10/20/2020 22:52   US Venous Img Lower Unilateral Right  Result Date: 10/20/2020 CLINICAL DATA:  Recent right knee arthroscopy with swelling for 3 days. Anticoagulation therapy. EXAM: Right LOWER EXTREMITY VENOUS DOPPLER ULTRASOUND TECHNIQUE: Gray-scale sonography with compression, as well as color and duplex ultrasound, were performed to evaluate the deep venous system(s) from the level of the common femoral vein through the popliteal and proximal calf veins. COMPARISON:  None. FINDINGS: VENOUS Normal compressibility of the common femoral, superficial femoral, and popliteal veins, as well as the visualized calf veins. Visualized portions of profunda femoral vein and great saphenous vein unremarkable. No filling defects to suggest DVT on grayscale or color Doppler imaging. Doppler waveforms show normal direction of venous flow, normal respiratory plasticity and response to augmentation. Limited views of the contralateral common femoral vein are unremarkable. OTHER None. Limitations: none IMPRESSION: No evidence  of deep venous thrombosis in the visualized right lower extremity veins. Electronically Signed   By: Lucienne Capers M.D.   On: 10/20/2020 22:09    Positive ROS: All other systems have been reviewed and were otherwise negative with the exception of those mentioned in the HPI and as above.  Physical Exam: General: Well developed, well nourished female seen in no acute distress. HEENT: Atraumatic and normocephalic. Sclera are clear. Extraocular motion is intact. Oropharynx is clear with moist mucosa. Neck: Supple, nontender, good range of motion. No JVD or carotid bruits. Lungs: Clear to auscultation bilaterally. Cardiovascular: Regular rate and rhythm with normal S1 and S2. No appreciable murmurs. No gallops or rubs. Pedal pulses are palpable bilaterally. Homans test is negative bilaterally, although the right calf is tender to palpation. No significant pretibial or ankle edema. Abdomen: Soft, nontender, and nondistended. Bowel sounds are present. Skin: No lesions in the area of chief complaint Neurologic: Awake, alert, and oriented. Sensory function is grossly intact. Motor strength is felt to be 5 over 5 bilaterally. No clonus or tremor. Good motor coordination. Lymphatic: No axillary or cervical lymphadenopathy  MUSCULOSKELETAL: Examination of the right knee demonstrates moderate soft tissue swelling.  An effusion is present.  The previous portal sites are well approximated with Steri-Strips.  There is no gross erythema or significant increased warmth to the knee.  Range of motion is somewhat limited secondary to pain.  The knee is stable to varus and valgus stress.  Assessment: Septic right knee, status post right knee arthroscopy  Plan: The findings were discussed in detail with the patient.  Recommendation was made for arthroscopic irrigation and debridement of the knee with probable placement of drain. The findings were discussed in detail with the patient. The usual perioperative course  was discussed. The risks and benefits of surgical intervention were reviewed. The patient expressed understanding of the risks and benefits and agreed with plans for surgical intervention. The surgical site was signed as per the "right site surgery" protocol.  We will continue with antibiotic coverage pending culture results.  Yarielis Funaro P. Holley Bouche M.D.

## 2020-10-22 ENCOUNTER — Encounter: Payer: Self-pay | Admitting: Orthopedic Surgery

## 2020-10-22 LAB — PROTEIN, BODY FLUID (OTHER): Total Protein, Body Fluid Other: 4.6 g/dL

## 2020-10-22 LAB — GLUCOSE, BODY FLUID OTHER: Glucose, Body Fluid Other: 32 mg/dL

## 2020-10-22 LAB — CREATININE, SERUM
Creatinine, Ser: 0.86 mg/dL (ref 0.44–1.00)
GFR, Estimated: >60 mL/min

## 2020-10-22 MED ORDER — SODIUM CHLORIDE 0.9 % IV SOLN
2.0000 g | Freq: Two times a day (BID) | INTRAVENOUS | Status: DC
  Administered 2020-10-22 – 2020-10-24 (×5): 2 g via INTRAVENOUS
  Filled 2020-10-22 (×6): qty 2

## 2020-10-22 NOTE — Evaluation (Addendum)
Occupational Therapy Evaluation Patient Details Name: [name] MRN: [id number] DOB: October 22, 1940 Today's Date: 10/22/2020    History of Present Illness [name] is a 80 y.o. female who underwent right knee arthroscopy, partial medial meniscectomy, and chondroplasty on 10/08/2020.  She was recovering well with minimal pain and swelling.  However, last Friday she noted the onset of swelling and increased right knee pain.  She also had some calf pain.  She denied any fevers or chills. S/P repeat arthroscopy.   Clinical Impression   [name] was seen for OT evaluation this date. Prior to hospital admission, pt was MOD I for mobility and ADLs. Pt lives alone, with help available PRN. Pt presents to acute OT demonstrating impaired ADL performance and functional mobility 2/2 decreased activity tolerance and functional ROM/balance deficits. Pt currently requires MIN A don R sock seated EOB. SUPERVISION lateral scoot t/f along EOB. Pt and daughter instructed in polar care mgt, falls prevention strategies, home/routines modifications, DME/AE for LB bathing/dressing tasks, and compression stocking mgt. Handout provided. Pt would benefit from skilled OT to address noted impairments and functional limitations (see below for any additional details) in order to maximize safety and independence while minimizing falls risk and caregiver burden. Upon hospital discharge, recommend HHOT to maximize pt safety and return to functional independence during meaningful occupations of daily life.     Follow Up Recommendations  Home health OT    Equipment Recommendations  3 in 1 bedside commode    Recommendations for Other Services       Precautions / Restrictions Precautions Precautions: Fall Restrictions Weight Bearing Restrictions: Yes RLE Weight Bearing: Weight bearing as tolerated      Mobility Bed Mobility Overal bed mobility: Needs Assistance Bed Mobility: Supine to Sit;Sit to Supine      Supine to sit: Min guard Sit to supine: Min guard   General bed mobility comments: HOB and L foot to hook R foot for BLE mgmt    Transfers Overall transfer level: Needs assistance Equipment used: None Transfers: Lateral/Scoot Transfers          Lateral/Scoot Transfers: Supervision      Balance Overall balance assessment: Needs assistance Sitting-balance support: Feet supported Sitting balance-Leahy Scale: Good                                     ADL either performed or assessed with clinical judgement   ADL Overall ADL's : Needs assistance/impaired                                       General ADL Comments: MIN A don R sock seated EOB. SUPERVISION lateral scoot t/f along EOB.                  Pertinent Vitals/Pain Pain Assessment: 0-10 Pain Score: 7  Pain Location: R knee Pain Descriptors / Indicators: Discomfort Pain Intervention(s): Monitored during session     Hand Dominance     Extremity/Trunk Assessment Upper Extremity Assessment Upper Extremity Assessment: Overall WFL for tasks assessed   Lower Extremity Assessment Lower Extremity Assessment: Defer to PT evaluation       Communication Communication Communication: No difficulties   Cognition Arousal/Alertness: Awake/alert Behavior During Therapy: WFL for tasks assessed/performed Overall Cognitive Status: Within Functional Limits for tasks assessed  General Comments       Exercises Exercises: Other exercises Other Exercises Other Exercises: Pt and family educated re: OT role, DME recs, d/c recs, falls prevention, ECS, home/routines modifications, toileting schedule Other Exercises: LBD< grooming, sup<>sit, sitting/balance/tolerance, lateral scoot   Shoulder Instructions      Home Living Family/patient expects to be discharged to:: Private residence Living Arrangements: Alone Available Help at  Discharge: Family;Available PRN/intermittently Type of Home: House Home Access: Level entry     Home Layout: One level     Bathroom Shower/Tub: Occupational psychologist: Standard     Home Equipment: Environmental consultant - 2 wheels          Prior Functioning/Environment Level of Independence: Independent with assistive device(s)                 OT Problem List: Decreased strength;Decreased range of motion;Decreased activity tolerance;Impaired balance (sitting and/or standing)      OT Treatment/Interventions: Self-care/ADL training;Therapeutic exercise;Energy conservation;DME and/or AE instruction;Therapeutic activities;Patient/family education;Balance training    OT Goals(Current goals can be found in the care plan section) Acute Rehab OT Goals Patient Stated Goal: to decrease pain OT Goal Formulation: With patient/family Time For Goal Achievement: 11/05/20 Potential to Achieve Goals: Good ADL Goals Pt Will Perform Grooming: with modified independence;standing (c LRAD PRN) Pt Will Transfer to Toilet: with modified independence;ambulating;regular height toilet Additional ADL Goal #1: Pt will Independently verbalize plan to implement x3 falls prevention strategies  OT Frequency: Min 1X/week    AM-PAC OT "6 Clicks" Daily Activity     Outcome Measure Help from another person eating meals?: None Help from another person taking care of personal grooming?: None Help from another person toileting, which includes using toliet, bedpan, or urinal?: A Little Help from another person bathing (including washing, rinsing, drying)?: A Little Help from another person to put on and taking off regular upper body clothing?: None Help from another person to put on and taking off regular lower body clothing?: A Little 6 Click Score: 21   End of Session Nurse Communication: Patient requests pain meds  Activity Tolerance: Patient tolerated treatment well Patient left: in bed;with call  bell/phone within reach;with bed alarm set;with nursing/sitter in room  OT Visit Diagnosis: Other abnormalities of gait and mobility (R26.89);Muscle weakness (generalized) (M62.81)                Time: 3536-1443 OT Time Calculation (min): 34 min Charges:  OT General Charges $OT Visit: 1 Visit OT Evaluation $OT Eval Low Complexity: 1 Low OT Treatments $Self Care/Home Management : 23-37 mins  Dessie Coma, M.S. OTR/L  10/22/20, 2:18 PM  ascom [phone]

## 2020-10-22 NOTE — Progress Notes (Signed)
  Subjective: 1 Day Post-Op Procedure(s) (LRB): Arthroscopic irrigation and debridement right knee (Right) Patient states that her neighbor who is a nurse came over and examined her knee after we spoke on the phone Monday evening. Nurse called after hours number at clinic and was advised to go to the emergency department.  Patient reports pain as well-controlled.   Patient is well, and has had no acute complaints or problems Plan is to go Home after hospital stay. Negative for chest pain and shortness of breath Fever: no Gastrointestinal: negative for nausea and vomiting.   Patient has not had a bowel movement.  Objective: Vital signs in last 24 hours: Temp:  [97 F (36.1 C)-98.6 F (37 C)] 97.9 F (36.6 C) (04/27 0848) Pulse Rate:  [48-78] 78 (04/27 0848) Resp:  [12-22] 16 (04/27 0848) BP: (116-142)/(59-73) 142/66 (04/27 0848) SpO2:  [95 %-98 %] 97 % (04/27 0848)  Intake/Output from previous day:  Intake/Output Summary (Last 24 hours) at 10/22/2020 1457 Last data filed at 10/22/2020 1350 Gross per 24 hour  Intake 2336.87 ml  Output 805 ml  Net 1531.87 ml    Intake/Output this shift: Total I/O In: 240 [P.O.:240] Out: 0   Labs: Recent Labs    10/20/20 2255  HGB 12.6   Recent Labs    10/20/20 2255  WBC 11.9*  RBC 4.06  HCT 37.0  PLT 290   Recent Labs    10/20/20 2255 10/22/20 0528  NA 134*  --   K 3.7  --   CL 103  --   CO2 22  --   BUN 20  --   CREATININE 0.99 0.86  GLUCOSE 147*  --   CALCIUM 8.6*  --    No results for input(s): LABPT, INR in the last 72 hours.   EXAM General - Patient is Alert, Appropriate and Oriented Extremity - Neurovascular intact Dorsiflexion/Plantar flexion intact Compartment soft Dressing/Incision -Postoperative dressing remains in place., Polar Care in place and working. , Hemovac in place.  Motor Function - intact, moving foot and toes well on exam.  Cardiovascular- Regular rate and rhythm, no  murmurs/rubs/gallops Respiratory- Lungs clear to auscultation bilaterally Gastrointestinal- soft, nontender and active bowel sounds   Assessment/Plan: 1 Day Post-Op Procedure(s) (LRB): Arthroscopic irrigation and debridement right knee (Right) Active Problems:   Septic arthritis of knee, right (HCC)  Estimated body mass index is 29.23 kg/m as calculated from the following:   Height as of this encounter: 5\' 3"  (1.6 m).   Weight as of this encounter: 74.8 kg. Advance diet Up with therapy  Discussed that d/c home tomorrow is a possibility if cultures continue to show no growth and patient meets therapy goals.   DVT Prophylaxis - Eloquis, Ted hose and foot pumps Weight-Bearing as tolerated to right leg  Cassell Smiles, PA-C [facility] Orthopaedic Surgery 10/22/2020, 2:57 PM

## 2020-10-22 NOTE — Evaluation (Signed)
Physical Therapy Evaluation Patient Details Name: [name] MRN: [id number] DOB: [birth year] Today's Date: 10/22/2020   History of Present Illness  [name] is a 80 y.o. female who underwent right knee arthroplasty, partial medial meniscectomy, and chondroplasty on 10/08/2020.  She was recovering well with minimal pain and swelling.  However, last Friday she noted the onset of swelling and increased right knee pain.  She also had some calf pain.  She denied any fevers or chills. S/P repeat arthroscopy.  Clinical Impression  Patient received in bed, daughter at bedside from [place]. Patient reports soreness in R knee. She is agreeable to PT session. Patient requires min assist with bed mobility and transfers. She ambulated 100 feet with RW and min guard. Patient limited by soreness and fatigue. She will continue to benefit from skilled PT while here to improve functional mobility and independence.       Follow Up Recommendations No PT follow up    Equipment Recommendations  None recommended by PT    Recommendations for Other Services       Precautions / Restrictions Precautions Precautions: Fall Restrictions Weight Bearing Restrictions: Yes RLE Weight Bearing: Weight bearing as tolerated      Mobility  Bed Mobility Overal bed mobility: Needs Assistance Bed Mobility: Supine to Sit;Sit to Supine     Supine to sit: Min assist Sit to supine: Min assist   General bed mobility comments: Requires min assist to raise trunk and min assist bringing R LE back up onto bed.    Transfers Overall transfer level: Needs assistance Equipment used: Rolling walker (2 wheeled) Transfers: Sit to/from Stand Sit to Stand: Min assist;From elevated surface         General transfer comment: cues for hand placement  Ambulation/Gait Ambulation/Gait assistance: Min guard Gait Distance (Feet): 100 Feet Assistive device: Rolling walker (2 wheeled) Gait Pattern/deviations: Step-to  pattern;Decreased stance time - right;Decreased weight shift to right;Shuffle Gait velocity: decr   General Gait Details: Patient is pain limited.  Stairs            Wheelchair Mobility    Modified Rankin (Stroke Patients Only)       Balance Overall balance assessment: Needs assistance Sitting-balance support: Feet supported Sitting balance-Leahy Scale: Good     Standing balance support: Bilateral upper extremity supported;During functional activity Standing balance-Leahy Scale: Good Standing balance comment: reliant on RW                             Pertinent Vitals/Pain Pain Assessment: Faces Faces Pain Scale: Hurts a little bit Pain Location: R knee Pain Descriptors / Indicators: Discomfort Pain Intervention(s): Monitored during session    Home Living Family/patient expects to be discharged to:: Private residence Living Arrangements: Alone Available Help at Discharge: Family;Available PRN/intermittently Type of Home: House Home Access: Level entry     Home Layout: One level Home Equipment: Walker - 2 wheels      Prior Function Level of Independence: Independent with assistive device(s)               Hand Dominance        Extremity/Trunk Assessment   Upper Extremity Assessment Upper Extremity Assessment: Defer to OT evaluation    Lower Extremity Assessment Lower Extremity Assessment: RLE deficits/detail RLE Sensation: WNL RLE Coordination: decreased gross motor    Cervical / Trunk Assessment Cervical / Trunk Assessment: Normal  Communication   Communication: No difficulties  Cognition Arousal/Alertness:  Awake/alert Behavior During Therapy: WFL for tasks assessed/performed Overall Cognitive Status: Within Functional Limits for tasks assessed                                        General Comments      Exercises     Assessment/Plan    PT Assessment Patient needs continued PT services  PT Problem  List Decreased strength;Decreased mobility;Decreased activity tolerance;Pain;Decreased range of motion       PT Treatment Interventions DME instruction;Gait training;Therapeutic exercise;Functional mobility training;Therapeutic activities;Patient/family education    PT Goals (Current goals can be found in the Care Plan section)  Acute Rehab PT Goals Patient Stated Goal: to decrease pain PT Goal Formulation: With patient/family Time For Goal Achievement: 10/29/20 Potential to Achieve Goals: Good    Frequency BID   Barriers to discharge Decreased caregiver support      Co-evaluation               AM-PAC PT "6 Clicks" Mobility  Outcome Measure Help needed turning from your back to your side while in a flat bed without using bedrails?: None Help needed moving from lying on your back to sitting on the side of a flat bed without using bedrails?: A Lot Help needed moving to and from a bed to a chair (including a wheelchair)?: A Little Help needed standing up from a chair using your arms (e.g., wheelchair or bedside chair)?: A Little Help needed to walk in hospital room?: A Little Help needed climbing 3-5 steps with a railing? : A Lot 6 Click Score: 17    End of Session Equipment Utilized During Treatment: Gait belt Activity Tolerance: Patient tolerated treatment well;Patient limited by pain;Patient limited by fatigue Patient left: in bed;with call bell/phone within reach;with bed alarm set;with family/visitor present Nurse Communication: Mobility status PT Visit Diagnosis: Other abnormalities of gait and mobility (R26.89);Difficulty in walking, not elsewhere classified (R26.2);Muscle weakness (generalized) (M62.81);Pain Pain - Right/Left: Right Pain - part of body: Knee    Time: 3664-4034 PT Time Calculation (min) (ACUTE ONLY): 23 min   Charges:   PT Evaluation $PT Eval Moderate Complexity: 1 Mod PT Treatments $Gait Training: 8-22 mins        Shamia Uppal, PT,  GCS 10/22/20,9:58 AM

## 2020-10-22 NOTE — Progress Notes (Signed)
Pharmacy Antibiotic Note  [name] is a 80 y.o. female admitted on 10/20/2020 with a septic knee s/p right knee arthroplasty, partial medial meniscectomy, and chondroplasty on 4/13/2.  Was recovering well with minimal pain and swelling. However, last Friday she noted the onset of welling and increased right knee pain. The right knee was aspirated by the EDP with a gram stain showing multiple WBCs but no organisms. Pharmacy has been consulted for Vancomycin and Cefepime dosing.  Plan: 1) Continue Vancomycin 1 gm IV Q24H  AUC = 442.0 Vanc trough = 11.1 mcg/mL  2) Cefepime 2 gm IV Q12H  Height: 5\' 3"  (160 cm) Weight: 74.8 kg (165 lb) IBW/kg (Calculated) : 52.4  Temp (24hrs), Avg:97.8 F (36.6 C), Min:97 F (36.1 C), Max:98.6 F (37 C)  Recent Labs  Lab 10/20/20 2255 10/20/20 2305 10/22/20 0528  WBC 11.9*  --   --   CREATININE 0.99  --  0.86  LATICACIDVEN  --  0.8  --     Estimated Creatinine Clearance: 53.1 mL/min (by C-G formula based on SCr of 0.86 mg/dL).    Allergies  Allergen Reactions  . Ace Inhibitors Swelling    Patient states her tongue swell.  [name] Penicillins Anxiety    Has patient had a PCN reaction causing immediate rash, facial/tongue/throat swelling, SOB or lightheadedness with hypotension: No Has patient had a PCN reaction causing severe rash involving mucus membranes or skin necrosis: No Has patient had a PCN reaction that required hospitalization No Has patient had a PCN reaction occurring within the last 10 years: No If all of the above answers are "NO", then may proceed with Cephalosporin use.    Antimicrobials this admission:  Vancomycin 4/26 >>   Cefepime 4/27 >>   Microbiology results: 4/25 BCx: NG x2 days 4/25 Body fluid Cx:  Rare WBC present, no organisms seen, final result pending   Thank you for allowing pharmacy to be a part of this patient's care.  Lance Coon A Giang Hemme 10/22/2020 12:22 PM

## 2020-10-22 NOTE — Progress Notes (Signed)
Physical Therapy Treatment Patient Details Name: [name] MRN: [id number] DOB: November 27, 1940 Today's Date: 10/22/2020    History of Present Illness [name] is a 80 y.o. female who underwent right knee arthroplasty, partial medial meniscectomy, and chondroplasty on 10/08/2020.  She was recovering well with minimal pain and swelling.  However, last Friday she noted the onset of swelling and increased right knee pain.  She also had some calf pain.  She denied any fevers or chills. S/P repeat arthroscopy.    PT Comments    Patient received in bed, she declines oob activity at this time. Agrees to bed exercises. Patient did well with LE strengthening and rom exercises and we will continue to see patient tomorrow and work on furthering ambulation for return home.        Follow Up Recommendations  No PT follow up     Equipment Recommendations  None recommended by PT    Recommendations for Other Services       Precautions / Restrictions Precautions Precautions: Fall Restrictions Weight Bearing Restrictions: Yes RLE Weight Bearing: Weight bearing as tolerated    Mobility  Bed Mobility Overal bed mobility: Needs Assistance Bed Mobility: Supine to Sit;Sit to Supine     Supine to sit: Min guard Sit to supine: Min guard   General bed mobility comments: patient declines getting oob again.    Transfers Overall transfer level: Needs assistance Equipment used: None Transfers: Lateral/Scoot Transfers          Lateral/Scoot Transfers: Supervision General transfer comment: declines  Ambulation/Gait             General Gait Details: declines walking again this pm.   Stairs             Wheelchair Mobility    Modified Rankin (Stroke Patients Only)       Balance Overall balance assessment: Needs assistance Sitting-balance support: Feet supported Sitting balance-Leahy Scale: Good                                      Cognition  Arousal/Alertness: Awake/alert Behavior During Therapy: WFL for tasks assessed/performed Overall Cognitive Status: Within Functional Limits for tasks assessed                                        Exercises Other Exercises Other Exercises: Pt and family educated re: OT role, DME recs, d/c recs, falls prevention, ECS, home/routines modifications, toileting schedule Other Exercises: LBD< grooming, sup<>sit, sitting/balance/tolerance, lateral scoot Other Exercises: R LE exercises: AP, heel slides, hip abd/add, SLR, SAQ x 10 reps    General Comments        Pertinent Vitals/Pain Pain Assessment: Faces Pain Score: 7  Faces Pain Scale: Hurts a little bit Pain Location: R knee Pain Descriptors / Indicators: Discomfort Pain Intervention(s): Monitored during session    Home Living Family/patient expects to be discharged to:: Private residence Living Arrangements: Alone Available Help at Discharge: Family;Available PRN/intermittently Type of Home: House Home Access: Level entry   Home Layout: One level Home Equipment: Walker - 2 wheels      Prior Function Level of Independence: Independent with assistive device(s)          PT Goals (current goals can now be found in the care plan section) Acute Rehab PT Goals Patient Stated  Goal: to decrease pain PT Goal Formulation: With patient Time For Goal Achievement: 10/29/20 Potential to Achieve Goals: Good Progress towards PT goals: Progressing toward goals    Frequency    7X/week      PT Plan Frequency needs to be updated    Co-evaluation              AM-PAC PT "6 Clicks" Mobility   Outcome Measure  Help needed turning from your back to your side while in a flat bed without using bedrails?: None Help needed moving from lying on your back to sitting on the side of a flat bed without using bedrails?: A Lot Help needed moving to and from a bed to a chair (including a wheelchair)?: A Little Help needed  standing up from a chair using your arms (e.g., wheelchair or bedside chair)?: A Little Help needed to walk in hospital room?: A Little Help needed climbing 3-5 steps with a railing? : A Little 6 Click Score: 18    End of Session   Activity Tolerance: Patient tolerated treatment well;Patient limited by lethargy Patient left: in bed;with call bell/phone within reach Nurse Communication: Mobility status PT Visit Diagnosis: Other abnormalities of gait and mobility (R26.89);Difficulty in walking, not elsewhere classified (R26.2);Muscle weakness (generalized) (M62.81);Pain Pain - Right/Left: Right Pain - part of body: Knee     Time: 3762-8315 PT Time Calculation (min) (ACUTE ONLY): 14 min  Charges:  $Therapeutic Exercise: 8-22 mins                     [employer], PT, GCS 10/22/20,3:34 PM

## 2020-10-23 LAB — CBC WITH DIFFERENTIAL/PLATELET
Abs Immature Granulocytes: 0.02 10*3/uL (ref 0.00–0.07)
Basophils Absolute: 0 10*3/uL (ref 0.0–0.1)
Basophils Relative: 0 %
Eosinophils Absolute: 0.1 10*3/uL (ref 0.0–0.5)
Eosinophils Relative: 2 %
HCT: 35 % — ABNORMAL LOW (ref 36.0–46.0)
Hemoglobin: 11.8 g/dL — ABNORMAL LOW (ref 12.0–15.0)
Immature Granulocytes: 0 %
Lymphocytes Relative: 27 %
Lymphs Abs: 2.5 10*3/uL (ref 0.7–4.0)
MCH: 30.4 pg (ref 26.0–34.0)
MCHC: 33.7 g/dL (ref 30.0–36.0)
MCV: 90.2 fL (ref 80.0–100.0)
Monocytes Absolute: 0.6 10*3/uL (ref 0.1–1.0)
Monocytes Relative: 6 %
Neutro Abs: 6 10*3/uL (ref 1.7–7.7)
Neutrophils Relative %: 65 %
Platelets: 341 10*3/uL (ref 150–400)
RBC: 3.88 MIL/uL (ref 3.87–5.11)
RDW: 12.9 % (ref 11.5–15.5)
WBC: 9.3 10*3/uL (ref 4.0–10.5)
nRBC: 0 % (ref 0.0–0.2)

## 2020-10-23 LAB — SEDIMENTATION RATE: Sed Rate: 60 mm/h — ABNORMAL HIGH (ref 0–30)

## 2020-10-23 LAB — C-REACTIVE PROTEIN: CRP: 3.4 mg/dL — ABNORMAL HIGH

## 2020-10-23 NOTE — Progress Notes (Signed)
  Subjective: 2 Days Post-Op Procedure(s) (LRB): Arthroscopic irrigation and debridement right knee (Right) Patient reports pain as well-controlled in the knee, but does complain of significant right sided neck pain this AM. Daughter at bedside.  Plan is to go Home after hospital stay. Negative for chest pain and shortness of breath Fever: no Gastrointestinal: negative for nausea and vomiting.   Patient has not had a bowel movement.  Objective: Vital signs in last 24 hours: Temp:  [97.8 F (36.6 C)-98.8 F (37.1 C)] 97.8 F (36.6 C) (04/28 0346) Pulse Rate:  [53-78] 53 (04/28 0346) Resp:  [16-18] 18 (04/28 0346) BP: (117-142)/(60-66) 124/65 (04/28 0346) SpO2:  [93 %-97 %] 96 % (04/28 0346)  Intake/Output from previous day:  Intake/Output Summary (Last 24 hours) at 10/23/2020 0758 Last data filed at 10/22/2020 1853 Gross per 24 hour  Intake 446.8 ml  Output 30 ml  Net 416.8 ml    Intake/Output this shift: No intake/output data recorded.  Labs: Recent Labs    10/20/20 2255  HGB 12.6   Recent Labs    10/20/20 2255  WBC 11.9*  RBC 4.06  HCT 37.0  PLT 290   Recent Labs    10/20/20 2255 10/22/20 0528  NA 134*  --   K 3.7  --   CL 103  --   CO2 22  --   BUN 20  --   CREATININE 0.99 0.86  GLUCOSE 147*  --   CALCIUM 8.6*  --    No results for input(s): LABPT, INR in the last 72 hours.   EXAM General - Patient is Alert, Appropriate and Oriented Extremity - Neurovascular intact Dorsiflexion/Plantar flexion intact Compartment soft Dressing/Incision -Postoperative dressing remains in place., Polar Care in place and working. , Hemovac in place.  Motor Function - intact, moving foot and toes well on exam.  Cardiovascular- Regular rate and rhythm, no murmurs/rubs/gallops Respiratory- Lungs clear to auscultation bilaterally Gastrointestinal- soft, nontender and active bowel sounds   Assessment/Plan: 2 Days Post-Op Procedure(s) (LRB): Arthroscopic irrigation  and debridement right knee (Right) Active Problems:   Septic arthritis of knee, right (HCC)  Estimated body mass index is 29.23 kg/m as calculated from the following:   Height as of this encounter: 5\' 3"  (1.6 m).   Weight as of this encounter: 74.8 kg. Advance diet Up with therapy  K pad ordered for patient's neck pain.  Awaiting culture results this AM. Will consider discharge this PM pending culture results and progress with therapy. Will return this PM to take down post-op dressing and remove drain.   DVT Prophylaxis - Eloquis, Ted hose and foot pumps Weight-Bearing as tolerated to right leg  Cassell Smiles, PA-C [facility] Orthopaedic Surgery 10/23/2020, 7:58 AM

## 2020-10-23 NOTE — Progress Notes (Signed)
PT Cancellation Note  Patient Details Name: [name] MRN: [id number] DOB: 10/12/1940   Cancelled Treatment:    Reason Eval/Treat Not Completed: Patient declined, no reason specified. Attempted to see patient at 9:35 she refused due to neck pain and just got heating pad on neck. Will return later to re-attempt.    Keigen Caddell 10/23/2020, 10:39 AM

## 2020-10-23 NOTE — Progress Notes (Signed)
Met with the patient to discuss DC plan and needs  The patient will have help with her daughter The patient is set up with [facility] PT She has a rw and a 3 in 1 at home She has transportation and can afford her medications

## 2020-10-23 NOTE — Progress Notes (Signed)
Physical Therapy Treatment Patient Details Name: [name] MRN: [id number] DOB: [birth year] Today's Date: 10/23/2020    History of Present Illness [name] is a 80 y.o. female who underwent right knee arthroscopy, partial medial meniscectomy, and chondroplasty on 10/08/2020.  She was recovering well with minimal pain and swelling.  However, last Friday she noted the onset of swelling and increased right knee pain.  She also had some calf pain.  She denied any fevers or chills. S/P repeat arthroscopy.    PT Comments    Patient received in bed, agreeable to PT session. Reports she has been up some. Patient is mod independent with bed mobility, min guard for transfers and ambulation of 175 feet.  She requires cues for sequencing and for proper use of AD for full effectiveness. Patient performed LE exercises at end of session. She will continue to benefit from skilled PT while here to improve functional mobility and independence.       Follow Up Recommendations  Home health PT     Equipment Recommendations  None recommended by PT    Recommendations for Other Services       Precautions / Restrictions Precautions Precautions: Fall Restrictions Weight Bearing Restrictions: Yes RLE Weight Bearing: Weight bearing as tolerated    Mobility  Bed Mobility Overal bed mobility: Modified Independent Bed Mobility: Supine to Sit;Sit to Supine     Supine to sit: Modified independent (Device/Increase time) Sit to supine: Modified independent (Device/Increase time)   General bed mobility comments: Patient does not require physical assist to get oob, increased time, use of rail, HOB elevated    Transfers Overall transfer level: Needs assistance Equipment used: Rolling walker (2 wheeled) Transfers: Sit to/from Stand Sit to Stand: Min guard            Ambulation/Gait Ambulation/Gait assistance: Min guard Gait Distance (Feet): 175 Feet Assistive device: Rolling walker (2  wheeled) Gait Pattern/deviations: Step-to pattern;Decreased weight shift to right;Decreased step length - left;Decreased step length - right;Shuffle;Decreased stance time - right;Trunk flexed Gait velocity: decr   General Gait Details: Patient requires cues to get centered in walker to assist with R LE, ambulates with antalgic pattern and shuffle left step   Stairs             Wheelchair Mobility    Modified Rankin (Stroke Patients Only)       Balance Overall balance assessment: Needs assistance Sitting-balance support: Feet supported Sitting balance-Leahy Scale: Good     Standing balance support: Bilateral upper extremity supported;During functional activity Standing balance-Leahy Scale: Fair Standing balance comment: reliant on RW                            Cognition Arousal/Alertness: Awake/alert Behavior During Therapy: WFL for tasks assessed/performed Overall Cognitive Status: Within Functional Limits for tasks assessed                                        Exercises Total Joint Exercises Ankle Circles/Pumps: AROM;Both;10 reps Heel Slides: AROM;Right;10 reps Hip ABduction/ADduction: AROM;Right;10 reps Straight Leg Raises: AROM;Right;10 reps    General Comments        Pertinent Vitals/Pain Pain Assessment: Faces Faces Pain Scale: Hurts a little bit Pain Location: R knee Pain Descriptors / Indicators: Discomfort Pain Intervention(s): Monitored during session;Repositioned;Ice applied;Premedicated before session    Home Living  Prior Function            PT Goals (current goals can now be found in the care plan section) Acute Rehab PT Goals Patient Stated Goal: to decrease pain PT Goal Formulation: With patient Time For Goal Achievement: 10/29/20 Potential to Achieve Goals: Good Progress towards PT goals: Progressing toward goals    Frequency    7X/week      PT Plan Current plan  remains appropriate    Co-evaluation              AM-PAC PT "6 Clicks" Mobility   Outcome Measure  Help needed turning from your back to your side while in a flat bed without using bedrails?: None Help needed moving from lying on your back to sitting on the side of a flat bed without using bedrails?: None Help needed moving to and from a bed to a chair (including a wheelchair)?: A Little Help needed standing up from a chair using your arms (e.g., wheelchair or bedside chair)?: A Little Help needed to walk in hospital room?: A Little Help needed climbing 3-5 steps with a railing? : A Little 6 Click Score: 20    End of Session Equipment Utilized During Treatment: Gait belt Activity Tolerance: Patient tolerated treatment well Patient left: in bed;with call bell/phone within reach;with family/visitor present Nurse Communication: Mobility status PT Visit Diagnosis: Other abnormalities of gait and mobility (R26.89);Difficulty in walking, not elsewhere classified (R26.2);Muscle weakness (generalized) (M62.81);Pain Pain - Right/Left: Right Pain - part of body: Knee     Time: 3007-6226 PT Time Calculation (min) (ACUTE ONLY): 27 min  Charges:  $Gait Training: 8-22 mins $Therapeutic Exercise: 8-22 mins                     Sadaf Przybysz, PT, GCS 10/23/20,1:57 PM

## 2020-10-23 NOTE — Progress Notes (Signed)
Kpad applied once received from materials

## 2020-10-24 LAB — CBC
HCT: 38 % (ref 36.0–46.0)
Hemoglobin: 12.8 g/dL (ref 12.0–15.0)
MCH: 30 pg (ref 26.0–34.0)
MCHC: 33.7 g/dL (ref 30.0–36.0)
MCV: 89.2 fL (ref 80.0–100.0)
Platelets: 380 10*3/uL (ref 150–400)
RBC: 4.26 MIL/uL (ref 3.87–5.11)
RDW: 12.9 % (ref 11.5–15.5)
WBC: 7.3 10*3/uL (ref 4.0–10.5)
nRBC: 0 % (ref 0.0–0.2)

## 2020-10-24 LAB — BASIC METABOLIC PANEL WITH GFR
Anion gap: 10 (ref 5–15)
BUN: 14 mg/dL (ref 8–23)
CO2: 24 mmol/L (ref 22–32)
Calcium: 9.4 mg/dL (ref 8.9–10.3)
Chloride: 105 mmol/L (ref 98–111)
Creatinine, Ser: 0.78 mg/dL (ref 0.44–1.00)
GFR, Estimated: >60 mL/min
Glucose, Bld: 92 mg/dL (ref 70–99)
Potassium: 3.8 mmol/L (ref 3.5–5.1)
Sodium: 139 mmol/L (ref 135–145)

## 2020-10-24 LAB — BODY FLUID CULTURE W GRAM STAIN: Culture: NO GROWTH

## 2020-10-24 MED ORDER — CIPROFLOXACIN HCL 500 MG PO TABS: 500.0000 mg | ORAL_TABLET | Freq: Two times a day (BID) | ORAL | 0 refills | Status: DC

## 2020-10-24 MED ORDER — CELECOXIB 200 MG PO CAPS: 200.0000 mg | ORAL_CAPSULE | Freq: Two times a day (BID) | ORAL | 0 refills | Status: DC

## 2020-10-24 MED ORDER — TRAMADOL HCL 50 MG PO TABS: 50.0000 mg | ORAL_TABLET | ORAL | 0 refills | Status: DC | PRN

## 2020-10-24 MED ORDER — DOXYCYCLINE MONOHYDRATE 100 MG PO TABS: 100.0000 mg | ORAL_TABLET | Freq: Two times a day (BID) | ORAL | 0 refills | Status: DC

## 2020-10-24 NOTE — Progress Notes (Signed)
Discharge instructions reviewed with the pt, including  Medications, f/u appointments, wound care, activity and signs/symptoms of infection. IV dc'd tip intact. Pt left with all her belongings and polar care. Pt taken to personal vehicle via wheelchair.

## 2020-10-24 NOTE — Progress Notes (Signed)
  Subjective: 3 Days Post-Op Procedure(s) (LRB): Arthroscopic irrigation and debridement right knee (Right) Patient reports she has no pain this AM. Patient is well, and has had no acute complaints or problems Plan is to go Home after hospital stay. Negative for chest pain and shortness of breath Fever: no Gastrointestinal: negative for nausea and vomiting.   Patient has had a bowel movement.  Objective: Vital signs in last 24 hours: Temp:  [97.8 F (36.6 C)-98.4 F (36.9 C)] 98 F (36.7 C) (04/29 0721) Pulse Rate:  [51-69] 59 (04/29 0721) Resp:  [15-18] 15 (04/29 0721) BP: (125-151)/(68-77) 151/77 (04/29 0721) SpO2:  [96 %-98 %] 98 % (04/29 0721)  Intake/Output from previous day:  Intake/Output Summary (Last 24 hours) at 10/24/2020 1219 Last data filed at 10/24/2020 1028 Gross per 24 hour  Intake 0 ml  Output --  Net 0 ml    Intake/Output this shift: No intake/output data recorded.  Labs: Recent Labs    10/23/20 1404 10/24/20 0718  HGB 11.8* 12.8   Recent Labs    10/23/20 1404 10/24/20 0718  WBC 9.3 7.3  RBC 3.88 4.26  HCT 35.0* 38.0  PLT 341 380   Recent Labs    10/22/20 0528 10/24/20 0718  NA  --  139  K  --  3.8  CL  --  105  CO2  --  24  BUN  --  14  CREATININE 0.86 0.78  GLUCOSE  --  92  CALCIUM  --  9.4   No results for input(s): LABPT, INR in the last 72 hours.   EXAM General - Patient is Alert, Appropriate and Oriented Extremity - Neurovascular intact Dorsiflexion/Plantar flexion intact Compartment soft Dressing/Incision -clean, dry, no drainage, Polar Care in place and working.  Motor Function - intact, moving foot and toes well on exam.  Cardiovascular- Regular rate and rhythm, no murmurs/rubs/gallops Respiratory- Lungs clear to auscultation bilaterally Gastrointestinal- soft, nontender and active bowel sounds   Assessment/Plan: 3 Days Post-Op Procedure(s) (LRB): Arthroscopic irrigation and debridement right knee (Right) Active  Problems:   Septic arthritis of knee, right (HCC)  Estimated body mass index is 29.23 kg/m as calculated from the following:   Height as of this encounter: 5\' 3"  (1.6 m).   Weight as of this encounter: 74.8 kg. Advance diet Up with therapy  Spoke with patient and daughter. Plan is for likely d/c today pending consult with Dr Ola Spurr of infectious disease. No growth noted at this time.   DVT Prophylaxis - Eloquis and foot pumps Weight-Bearing as tolerated to right leg  Cassell Smiles, PA-C [facility] Orthopaedic Surgery 10/24/2020, 12:19 PM

## 2020-10-24 NOTE — Consult Note (Signed)
Infectious Disease     Reason for Consult: Septic knee   Referring Physician: Dr Marry Guan Date of Admission:  10/20/2020   Active Problems:   Septic arthritis of knee, right [facility])   HPI: [name] is a 80 y.o. female with a history of hypertension,Hyperlipidemia proximal A. fib who initially underwent on April 13 right knee arthroscopy  for right knee pain.  She did well postop however developed worsening knee pain and swelling over 3-day period.  She was seen in the emergency room and underwent aspiration.  Concern for septic arthritis so she underwent a washout on April 26.  She had limited synovectomy at that time.  Cultures so far have been all negative.  She did have on admission April 25 a white count of 11.9.  She has had no fevers.  Her CRP and sed rate were checked and her CRP was noted to be 16.4 on April 25 but has come down to 3.4. She has been on vancomycin and ceftriaxone. Currently she reports feeling well.  She has min pain in knee.  Past Medical History:  Diagnosis Date  . History of stress test    a. 02/2013 Nl stress test.  . Hypertension   . Insomnia, unspecified   . Mitral regurgitation    a. echo 01/2016: nl LV sys fxn, mild MR, w/o pulm htn, nl atrial size  . Osteoporosis, unspecified   . Paroxysmal atrial fibrillation ([place]) 01/2016   a. diagnosed 01/2016; b. has been on eliquis, pradaxa, and xarelto at varying times 2/2 cost of each medication-->currently on eliquis (11/2016)  . Pure hypercholesterolemia   . Sleep apnea    Unable to tolerate nocturnal PAP therapy  . TIA (transient ischemic attack)   . Unspecified asthma(493.90)   . Vertigo    Past Surgical History:  Procedure Laterality Date  . ABDOMINAL HYSTERECTOMY    . CATARACT EXTRACTION Right 08/31/2016  . COLONOSCOPY WITH PROPOFOL N/A 03/04/2015   Procedure: COLONOSCOPY WITH PROPOFOL;  Surgeon: Lucilla Lame, MD;  Location: [facility] ENDOSCOPY;  Service: Endoscopy;  Laterality: N/A;  . COLONOSCOPY WITH  PROPOFOL N/A 04/01/2020   Procedure: COLONOSCOPY WITH PROPOFOL;  Surgeon: Lucilla Lame, MD;  Location: [facility] ENDOSCOPY;  Service: Endoscopy;  Laterality: N/A;  . DIAGNOSTIC LAPAROSCOPY     REMOVAL OF SCAR TISSUE (ADHESIONS)  . KNEE ARTHROSCOPY Right 10/08/2020   Procedure: ARTHROSCOPY KNEE;  Surgeon: Dereck Leep, MD;  Location: [facility] ORS;  Service: Orthopedics;  Laterality: Right;  . KNEE ARTHROSCOPY Right 10/21/2020   Procedure: Arthroscopic irrigation and debridement right knee;  Surgeon: Dereck Leep, MD;  Location: [facility] ORS;  Service: Orthopedics;  Laterality: Right;   Social History   Tobacco Use  . Smoking status: Never Smoker  . Smokeless tobacco: Never Used  . Tobacco comment: smoking cessation materials not required  Vaping Use  . Vaping Use: Never used  Substance Use Topics  . Alcohol use: No    Alcohol/week: 0.0 standard drinks  . Drug use: No   Family History  Problem Relation Age of Onset  . COPD Father   . Hypertension Mother   . Aneurysm Mother   . Hypertension Brother   . Stroke Brother   . Cancer Brother        pancreatic  . Healthy Brother     Allergies:  Allergies  Allergen Reactions  . Ace Inhibitors Swelling    Patient states her tongue swell.  [name] Penicillins Anxiety    Has patient had a  PCN reaction causing immediate rash, facial/tongue/throat swelling, SOB or lightheadedness with hypotension: No Has patient had a PCN reaction causing severe rash involving mucus membranes or skin necrosis: No Has patient had a PCN reaction that required hospitalization No Has patient had a PCN reaction occurring within the last 10 years: No If all of the above answers are "NO", then may proceed with Cephalosporin use.    Current antibiotics: Antibiotics Given (last 72 hours)    Date/Time Action Medication Dose Rate   10/22/20 0055 New Bag/Given   vancomycin (VANCOREADY) IVPB 1000 mg/200 mL 1,000 mg 200 mL/hr   10/22/20 1311 New Bag/Given   ceFEPIme  (MAXIPIME) 2 g in sodium chloride 0.9 % 100 mL IVPB 2 g 200 mL/hr   10/22/20 2112 New Bag/Given   ceFEPIme (MAXIPIME) 2 g in sodium chloride 0.9 % 100 mL IVPB 2 g 200 mL/hr   10/23/20 0141 New Bag/Given   vancomycin (VANCOREADY) IVPB 1000 mg/200 mL 1,000 mg 200 mL/hr   10/23/20 0933 New Bag/Given   ceFEPIme (MAXIPIME) 2 g in sodium chloride 0.9 % 100 mL IVPB 2 g 200 mL/hr   10/23/20 2212 New Bag/Given   ceFEPIme (MAXIPIME) 2 g in sodium chloride 0.9 % 100 mL IVPB 2 g 200 mL/hr   10/24/20 0104 New Bag/Given   vancomycin (VANCOREADY) IVPB 1000 mg/200 mL 1,000 mg 200 mL/hr   10/24/20 1018 New Bag/Given   ceFEPIme (MAXIPIME) 2 g in sodium chloride 0.9 % 100 mL IVPB 2 g 200 mL/hr      MEDICATIONS: . amLODipine  5 mg Oral BID  . apixaban  5 mg Oral BID  . celecoxib  200 mg Oral BID  . rosuvastatin  10 mg Oral Daily  . senna-docusate  1 tablet Oral BID    Review of Systems - 11 systems reviewed and negative per HPI   OBJECTIVE: Temp:  [97.8 F (36.6 C)-98.4 F (36.9 C)] 98 F (36.7 C) (04/29 0721) Pulse Rate:  [51-69] 59 (04/29 0721) Resp:  [15-18] 15 (04/29 0721) BP: (125-151)/(68-77) 151/77 (04/29 0721) SpO2:  [96 %-98 %] 98 % (04/29 0721) Physical Exam  Constitutional:  oriented to person, place, and time. HENT: [place]/AT, PERRLA, no scleral icterus Mouth/Throat: Oropharynx is clear and moist. No oropharyngeal exudate.  Cardiovascular: Normal rate, regular rhythm and normal heart sounds. Pulmonary/Chest: Effort normal and breath sounds normal. No respiratory distress.  has no wheezes.  Neck = supple, no nuchal rigidity Abdominal: Soft. Bowel sounds are normal.  exhibits no distension. There is no tenderness.  Lymphadenopathy: no cervical adenopathy. No axillary adenopathy Neurological: alert and oriented to person, place, and time.  Skin: R knee with post op dressingPsychiatric: a normal mood and affect.  behavior is normal.    LABS: Results for orders placed or performed  during the hospital encounter of 10/20/20 (from the past 48 hour(s))  CBC with Differential/Platelet     Status: Abnormal   Collection Time: 10/23/20  2:04 PM  Result Value Ref Range   WBC 9.3 4.0 - 10.5 K/uL   RBC 3.88 3.87 - 5.11 MIL/uL   Hemoglobin 11.8 (L) 12.0 - 15.0 g/dL   HCT 35.0 (L) 36.0 - 46.0 %   MCV 90.2 80.0 - 100.0 fL   MCH 30.4 26.0 - 34.0 pg   MCHC 33.7 30.0 - 36.0 g/dL   RDW 12.9 11.5 - 15.5 %   Platelets 341 150 - 400 K/uL   nRBC 0.0 0.0 - 0.2 %   Neutrophils Relative %  65 %   Neutro Abs 6.0 1.7 - 7.7 K/uL   Lymphocytes Relative 27 %   Lymphs Abs 2.5 0.7 - 4.0 K/uL   Monocytes Relative 6 %   Monocytes Absolute 0.6 0.1 - 1.0 K/uL   Eosinophils Relative 2 %   Eosinophils Absolute 0.1 0.0 - 0.5 K/uL   Basophils Relative 0 %   Basophils Absolute 0.0 0.0 - 0.1 K/uL   Immature Granulocytes 0 %   Abs Immature Granulocytes 0.02 0.00 - 0.07 K/uL    Comment: Performed at [facility], [address] [place], [place] [address]  Sedimentation rate     Status: Abnormal   Collection Time: 10/23/20  2:04 PM  Result Value Ref Range   Sed Rate 60 (H) 0 - 30 mm/hr    Comment: Performed at [facility], [address] [place], [place] [address]  C-reactive protein     Status: Abnormal   Collection Time: 10/23/20  2:04 PM  Result Value Ref Range   CRP 3.4 (H) <1.0 mg/dL    Comment: Performed at [facility] [address], [place], [place] [address]  Basic metabolic panel     Status: None   Collection Time: 10/24/20  7:18 AM  Result Value Ref Range   Sodium 139 135 - 145 mmol/L   Potassium 3.8 3.5 - 5.1 mmol/L   Chloride 105 98 - 111 mmol/L   CO2 24 22 - 32 mmol/L   Glucose, Bld 92 70 - 99 mg/dL    Comment: Glucose reference range applies only to samples taken after fasting for at least 8 hours.   BUN 14 8 - 23 mg/dL   Creatinine, Ser 0.78 0.44 - 1.00 mg/dL   Calcium 9.4 8.9 - 10.3 mg/dL   GFR, Estimated >60 >60 mL/min    Comment:  (NOTE) Calculated using the CKD-EPI Creatinine Equation (2021)    Anion gap 10 5 - 15    Comment: Performed at [facility] [address] [place], [place] [address]  CBC     Status: None   Collection Time: 10/24/20  7:18 AM  Result Value Ref Range   WBC 7.3 4.0 - 10.5 K/uL   RBC 4.26 3.87 - 5.11 MIL/uL   Hemoglobin 12.8 12.0 - 15.0 g/dL   HCT 38.0 36.0 - 46.0 %   MCV 89.2 80.0 - 100.0 fL   MCH 30.0 26.0 - 34.0 pg   MCHC 33.7 30.0 - 36.0 g/dL   RDW 12.9 11.5 - 15.5 %   Platelets 380 150 - 400 K/uL   nRBC 0.0 0.0 - 0.2 %    Comment: Performed at [facility], [address] [place], [place] [address]   No components found for: ESR, C REACTIVE PROTEIN MICRO: Recent Results (from the past 720 hour(s))  SARS CORONAVIRUS 2 (TAT 6-24 HRS) Nasopharyngeal Nasopharyngeal Swab     Status: None   Collection Time: 10/06/20  8:26 AM   Specimen: Nasopharyngeal Swab  Result Value Ref Range Status   SARS Coronavirus 2 NEGATIVE NEGATIVE Final    Comment: (NOTE) SARS-CoV-2 target nucleic acids are NOT DETECTED.  The SARS-CoV-2 RNA is generally detectable in upper and lower respiratory specimens during the acute phase of infection. Negative results do not preclude SARS-CoV-2 infection, do not rule out co-infections with other pathogens, and should not be used as the sole basis for treatment or other patient management decisions. Negative results must be combined with clinical observations, patient history,  and epidemiological information. The expected result is Negative.  Fact Sheet for Patients: SugarRoll.be  Fact Sheet for Healthcare Providers: https://www.woods-mathews.com/  This test is not yet approved or cleared by the [place] FDA and  has been authorized for detection and/or diagnosis of SARS-CoV-2 by FDA under an Emergency Use Authorization (EUA). This EUA will remain  in effect (meaning this test can be used)  for the duration of the COVID-19 declaration under Se ction 564(b)(1) of the Act, 21 U.S.C. section 360bbb-3(b)(1), unless the authorization is terminated or revoked sooner.  Performed at [facility], [place] [address] [place], [place] [address]   Blood culture (single)     Status: None (Preliminary result)   Collection Time: 10/20/20 10:55 PM   Specimen: BLOOD  Result Value Ref Range Status   Specimen Description BLOOD LEFT ANTECUBITAL  Final   Special Requests   Final    BOTTLES DRAWN AEROBIC AND ANAEROBIC Blood Culture adequate volume   Culture   Final    NO GROWTH 4 DAYS Performed at [facility], [address], [place], [place] [address]    Report Status PENDING  Incomplete  Culture, blood (single) w Reflex to ID Panel     Status: None (Preliminary result)   Collection Time: 10/20/20 10:56 PM   Specimen: BLOOD  Result Value Ref Range Status   Specimen Description BLOOD BLOOD RIGHT HAND  Final   Special Requests   Final    BOTTLES DRAWN AEROBIC AND ANAEROBIC Blood Culture adequate volume   Culture   Final    NO GROWTH 4 DAYS Performed at [facility], [address] [place], [place] [address]    Report Status PENDING  Incomplete  Body fluid culture w Gram Stain     Status: None   Collection Time: 10/20/20 11:45 PM   Specimen: Synovium; Body Fluid  Result Value Ref Range Status   Specimen Description   Final    SYNOVIAL RIGHT KNEE Performed at [facility], [address] [place], [place] [address]    Special Requests NONE  Final   Gram Stain   Final    ABUNDANT WBC PRESENT, PREDOMINANTLY PMN NO ORGANISMS SEEN    Culture   Final    NO GROWTH 3 DAYS Performed at [facility], [place] [address], [place], [place] [address]    Report Status 10/24/2020 FINAL  Final  Gram stain     Status: None   Collection Time: 10/20/20 11:45 PM   Specimen: Synovium  Result Value Ref Range Status   Specimen Description SYNOVIAL RIGHT KNEE   Final   Special Requests NONE  Final   Gram Stain   Final    WBC SEEN NO RBC SEEN NO ORGANISMS SEEN Performed at [facility], [address] [place], [place] [address]    Report Status 10/21/2020 FINAL  Final  Resp Panel by RT-PCR (Flu A&B, Covid) Nasopharyngeal Swab     Status: None   Collection Time: 10/21/20  3:24 AM   Specimen: Nasopharyngeal Swab; Nasopharyngeal(NP) swabs in vial transport medium  Result Value Ref Range Status   SARS Coronavirus 2 by RT PCR NEGATIVE NEGATIVE Final    Comment: (NOTE) SARS-CoV-2 target nucleic acids are NOT DETECTED.  The SARS-CoV-2 RNA is generally detectable in upper respiratory specimens during the acute phase of infection. The lowest concentration of SARS-CoV-2 viral copies this assay can detect is 138 copies/mL. A negative result does not preclude SARS-Cov-2 infection and  should not be used as the sole basis for treatment or other patient management decisions. A negative result may occur with  improper specimen collection/handling, submission of specimen other than nasopharyngeal swab, presence of viral mutation(s) within the areas targeted by this assay, and inadequate number of viral copies(<138 copies/mL). A negative result must be combined with clinical observations, patient history, and epidemiological information. The expected result is Negative.  Fact Sheet for Patients:  EntrepreneurPulse.com.au  Fact Sheet for Healthcare Providers:  IncredibleEmployment.be  This test is no t yet approved or cleared by the [place] FDA and  has been authorized for detection and/or diagnosis of SARS-CoV-2 by FDA under an Emergency Use Authorization (EUA). This EUA will remain  in effect (meaning this test can be used) for the duration of the COVID-19 declaration under Section 564(b)(1) of the Act, 21 U.S.C.section 360bbb-3(b)(1), unless the authorization is terminated  or revoked sooner.        Influenza A by PCR NEGATIVE NEGATIVE Final   Influenza B by PCR NEGATIVE NEGATIVE Final    Comment: (NOTE) The Xpert Xpress SARS-CoV-2/FLU/RSV plus assay is intended as an aid in the diagnosis of influenza from Nasopharyngeal swab specimens and should not be used as a sole basis for treatment. Nasal washings and aspirates are unacceptable for Xpert Xpress SARS-CoV-2/FLU/RSV testing.  Fact Sheet for Patients: EntrepreneurPulse.com.au  Fact Sheet for Healthcare Providers: IncredibleEmployment.be  This test is not yet approved or cleared by the [place] FDA and has been authorized for detection and/or diagnosis of SARS-CoV-2 by FDA under an Emergency Use Authorization (EUA). This EUA will remain in effect (meaning this test can be used) for the duration of the COVID-19 declaration under Section 564(b)(1) of the Act, 21 U.S.C. section 360bbb-3(b)(1), unless the authorization is terminated or revoked.  Performed at [facility], [address] [place], [place] [address]   Gram stain     Status: None   Collection Time: 10/21/20  6:36 PM   Specimen: PATH Other; Tissue  Result Value Ref Range Status   Specimen Description SYNOVIAL RIGHT KNEE  Final   Special Requests NONE  Final   Gram Stain   Final    CYTOSPIN SLIDE WBC PRESENT,BOTH PMN AND MONONUCLEAR NO ORGANISMS SEEN Performed at [facility], [address], [place], [place] [address]    Report Status 10/21/2020 FINAL  Final  Body fluid culture w Gram Stain     Status: None (Preliminary result)   Collection Time: 10/21/20  6:36 PM   Specimen: PATH Other; Tissue  Result Value Ref Range Status   Specimen Description   Final    SYNOVIAL RIGHT KNEE Performed at [facility], [address], [place], [place] [address]    Special Requests   Final    NONE Performed at [facility], [address] [place], [place] [address]    Gram  Stain   Final    RARE WBC PRESENT, PREDOMINANTLY PMN NO ORGANISMS SEEN    Culture   Final    NO GROWTH 3 DAYS Performed at [facility], [place] [address], [place], [place] [address]    Report Status PENDING  Incomplete    IMAGING: DG Knee 2 Views Right  Result Date: 10/20/2020 CLINICAL DATA:  Increasing pain and swelling to the right knee. Arthroscopy on 10/08/2020. EXAM: RIGHT KNEE - 1-2 VIEW COMPARISON:  None. FINDINGS: Mild degenerative changes with medial compartment narrowing and small osteophyte formation. No evidence of acute fracture or  dislocation. No focal bone lesion or bone destruction. Large right knee effusion. IMPRESSION: Mild degenerative changes in the medial compartment. Large effusion. No acute bony abnormalities. Electronically Signed   By: Lucienne Capers M.D.   On: 10/20/2020 22:52   US Venous Img Lower Unilateral Right  Result Date: 10/20/2020 CLINICAL DATA:  Recent right knee arthroscopy with swelling for 3 days. Anticoagulation therapy. EXAM: Right LOWER EXTREMITY VENOUS DOPPLER ULTRASOUND TECHNIQUE: Gray-scale sonography with compression, as well as color and duplex ultrasound, were performed to evaluate the deep venous system(s) from the level of the common femoral vein through the popliteal and proximal calf veins. COMPARISON:  None. FINDINGS: VENOUS Normal compressibility of the common femoral, superficial femoral, and popliteal veins, as well as the visualized calf veins. Visualized portions of profunda femoral vein and great saphenous vein unremarkable. No filling defects to suggest DVT on grayscale or color Doppler imaging. Doppler waveforms show normal direction of venous flow, normal respiratory plasticity and response to augmentation. Limited views of the contralateral common femoral vein are unremarkable. OTHER None. Limitations: none IMPRESSION: No evidence of deep venous thrombosis in the visualized right lower extremity veins. Electronically Signed   By:  Lucienne Capers M.D.   On: 10/20/2020 22:09    Assessment:   [name] is a 80 y.o. female with possible septic R knee 2 weeks following elective arthroscopic surgery.  She did have elevated white count in the 20-30,000 range on her knee aspirations with predominantly PMNs.  Crystal analysis were negative on both aspiration and surgery.  Cultures x2 of the knee and of the blood are negative.  She had a mild white count but no fevers or chills.  CRP and ESR were mildly elevated but decreased quickly.  Recommendations Given cultures negative but still concern for infection and would suggest we do oral empiric treatment with doxycycline and ciprofloxacin.  The doxycycline would be 100 mg twice a day and ciprofloxacin 500 mg twice a day.  I reviewed this dosing and the side effects potentially with these regimens. I would suggest another 10 days which will be a total of 14 days postop. I would suggest that at her follow-up visit she had a sed rate CRP monitored.  If they remain elevated we may need to prolong treatment. She will need close follow-up after antibiotics are stopped as well.  Thank you very much for allowing me to participate in the care of this patient. Please call with questions.   Cheral Marker. Ola Spurr, MD

## 2020-10-24 NOTE — TOC Progression Note (Addendum)
Transition of Care [facility]) - Progression Note    Patient Details  Name: [name] MRN: [id number] Date of Birth: [birth year]  Transition of Care [facility]) CM/SW Contact  Su Hilt, RN Phone Number: 10/24/2020, 11:12 AM  Clinical Narrative:   Sonia Side with [facility] called to notify me that he does not think that due to the procedure that the patient had that she will qualify for [facility], [facility] will not be able to accept the patient, I requested [facility] to accept the patient and they stated that they would not be able to take this patient, [facility] is not able to [facility] the patient, [facility] is not able to accept the patient, I called Sarah with [facility] and requested that they accept the patient, [facility] are unable to accept the patient   Expected Discharge Plan: [place] Barriers to Discharge: Continued Medical Work up  Expected Discharge Plan and Services Expected Discharge Plan: [place]   Discharge Planning Services: CM Consult   Living arrangements for the past 2 months: Single Family Home Expected Discharge Date: 10/24/20               DME Arranged: N/A         HH Arranged: PT HH Agency: [facility] (formerly [employer]) Date [place]: 10/23/20 Time [place]: [place] Representative spoke with at [place]: [place]   Social Determinants of Health ([place]) Interventions    Readmission Risk Interventions No flowsheet data found.

## 2020-10-24 NOTE — TOC Progression Note (Addendum)
Transition of Care [facility]) - Progression Note    Patient Details  Name: [name] MRN: [id number] Date of Birth: 1941/05/24  Transition of Care [facility]) CM/SW [place], RN Phone Number: 10/24/2020, 11:17 AM  Clinical Narrative:   Rejeana Brock transportation to inquire if they provide transportation services from home to Dr appointments or to Outpatient PT, Spoke to [place], he stated that the intake forms will need to be filled out the patient be set up in the program and the physician or PT be a [facility]. I notified the patient, she stated that she would do the exercises at home on her own.   Expected Discharge Plan: [place] Barriers to Discharge: Continued Medical Work up  Expected Discharge Plan and Services Expected Discharge Plan: [place]   Discharge Planning Services: CM Consult   Living arrangements for the past 2 months: Single Family Home Expected Discharge Date: 10/24/20               DME Arranged: N/A         HH Arranged: PT HH Agency: [facility] ([facility] [employer]) Date [place]: 10/23/20 Time [place]: [id number] Representative spoke with at [place]: [place]   Social Determinants of Health ([place]) Interventions    Readmission Risk Interventions No flowsheet data found.

## 2020-10-24 NOTE — Discharge Summary (Signed)
Physician Discharge Summary  [name] MRN: [id number] DOB/AGE: December 13, 1940 80 y.o.  Admit date: 10/20/2020 Discharge date: 10/24/2020  Admission Diagnoses:  Effusion of right knee [M25.461] Septic arthritis of knee, right (HCC) [M00.9] Acute pain of right knee [M25.561] Arthritis of right knee due to other bacteria ([place]) [M00.861]  Surgeries:Procedure(s): Right knee arthroscopy, arthroscopic irrigation and debridement of the right knee  SURGEON:  Marciano Sequin., M.D.          ASSISTANT: none  ANESTHESIA: general  ESTIMATED BLOOD LOSS: Minimal  FLUIDS REPLACED: 600 mL of crystalloid  TOURNIQUET TIME: 45 minutes  DRAINS: 1 medium Hemovac  Discharge Diagnoses: Patient Active Problem List   Diagnosis Date Noted  . Septic arthritis of knee, right ([place]) 10/21/2020  . Osteoarthritis of right knee 05/05/2020  . History of colonic polyps   . Polyp of transverse colon   . Aneurysm ([place]) 02/08/2018  . History of CVA (cerebrovascular accident) without residual deficits 01/24/2018  . 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 TIA (transient ischemic attack) 01/22/2018  . Impingement syndrome of shoulder region 01/02/2018  . History of cataract surgery, right 09/01/2016  . Paroxysmal atrial fibrillation ([place]) 02/24/2016  . Vertigo 02/24/2016  . Low HDL (under 40) 02/17/2016  . Dizziness 02/15/2016  . Hx of colonic polyps   . Benign neoplasm of sigmoid colon   . Asthma, mild intermittent 02/21/2015  . Hyperglycemia 02/21/2015  . Essential hypertension 02/20/2015  . Inconclusive mammogram due to dense breasts 01/02/2015  . Dense breast 01/02/2015  . Abnormal EKG 02/23/2013    Past Medical History:  Diagnosis Date  . History of stress test    a. 02/2013 Nl stress test.  . Hypertension   . Insomnia, unspecified   . Mitral regurgitation    a. echo 01/2016: nl LV sys fxn, mild MR, w/o pulm htn, nl atrial size  . Osteoporosis, unspecified   .  Paroxysmal atrial fibrillation ([place]) 01/2016   a. diagnosed 01/2016; b. has been on eliquis, pradaxa, and xarelto at varying times 2/2 cost of each medication-->currently on eliquis (11/2016)  . Pure hypercholesterolemia   . Sleep apnea    Unable to tolerate nocturnal PAP therapy  . TIA (transient ischemic attack)   . Unspecified asthma(493.90)   . Vertigo      Transfusion:    Consultants (if any): Infectious Disease   Discharged Condition: Improved  Hospital Course: [name] is an 80 y.o. female who was admitted 10/20/2020 with a diagnosis of septic right knee following a previous right knee arthroscopy with partial medial meniscectomy and chondroplasty on 10/08/20 and went to the operating room on 10/21/2020 and underwent right knee arthroscopy, arthroscopic irrigation and debridement of the right knee. The patient received perioperative antibiotics for prophylaxis (see below). The patient tolerated the procedure well and was transported to PACU in stable condition. After meeting PACU criteria, the patient was subsequently transferred to the Orthopaedics/Rehabilitation unit.   The patient received DVT prophylaxis in the form of early mobilization, Eliquis, and foot pumps. A sacral pad had been placed and heels were elevated off of the bed with rolled towels in order to protect skin integrity. Foley catheter was discontinued on postoperative day #0. Wound drains were discontinued on postoperative day #2. The surgical incisions were healing well without signs of infection.  Physical therapy was initiated postoperatively for transfers, gait training, and strengthening. Occupational therapy was initiated for activities of daily living and evaluation for assisted devices. Rehabilitation goals  were reviewed in detail with the patient. The patient made steady progress with physical therapy and physical therapy recommended discharge to Home.   Infectious Disease was consulted and recommended  treatment with 10 days of antibiotics.   The patient achieved the preliminary goals of this hospitalization and was felt to be medically and orthopaedically appropriate for discharge.  She was given perioperative antibiotics:  Anti-infectives (From admission, onward)   Start     Dose/Rate Route Frequency Ordered Stop   10/24/20 0000  ciprofloxacin (CIPRO) 500 MG tablet        500 mg Oral 2 times daily 10/24/20 1418     10/24/20 0000  doxycycline (ADOXA) 100 MG tablet        100 mg Oral 2 times daily 10/24/20 1418     10/22/20 1315  ceFEPIme (MAXIPIME) 2 g in sodium chloride 0.9 % 100 mL IVPB        2 g 200 mL/hr over 30 Minutes Intravenous Every 12 hours 10/22/20 1215     10/22/20 0100  vancomycin (VANCOCIN) IVPB 1000 mg/200 mL premix  Status:  Discontinued        1,000 mg 200 mL/hr over 60 Minutes Intravenous Every 24 hours 10/21/20 0507 10/21/20 1517   10/22/20 0100  vancomycin (VANCOREADY) IVPB 1000 mg/200 mL        1,000 mg 200 mL/hr over 60 Minutes Intravenous Every 24 hours 10/21/20 1517     10/21/20 0015  cefTRIAXone (ROCEPHIN) 2 g in sodium chloride 0.9 % 100 mL IVPB        2 g 200 mL/hr over 30 Minutes Intravenous  Once 10/21/20 0003 10/21/20 0200   10/21/20 0015  vancomycin (VANCOREADY) IVPB 1750 mg/350 mL        1,750 mg 175 mL/hr over 120 Minutes Intravenous  Once 10/21/20 0010 10/21/20 0313    .  Recent vital signs:  Vitals:   10/24/20 0509 10/24/20 0721  BP: 125/73 (!) 151/77  Pulse: 69 (!) 59  Resp: 18 15  Temp: 98.4 F (36.9 C) 98 F (36.7 C)  SpO2: 98% 98%    Recent laboratory studies:  Recent Labs    10/22/20 0528 10/23/20 1404 10/24/20 0718  WBC  --  9.3 7.3  HGB  --  11.8* 12.8  HCT  --  35.0* 38.0  PLT  --  341 380  K  --   --  3.8  CL  --   --  105  CO2  --   --  24  BUN  --   --  14  CREATININE 0.86  --  0.78  GLUCOSE  --   --  92  CALCIUM  --   --  9.4    Diagnostic Studies: DG Knee 2 Views Right  Result Date: 10/20/2020 CLINICAL  DATA:  Increasing pain and swelling to the right knee. Arthroscopy on 10/08/2020. EXAM: RIGHT KNEE - 1-2 VIEW COMPARISON:  None. FINDINGS: Mild degenerative changes with medial compartment narrowing and small osteophyte formation. No evidence of acute fracture or dislocation. No focal bone lesion or bone destruction. Large right knee effusion. IMPRESSION: Mild degenerative changes in the medial compartment. Large effusion. No acute bony abnormalities. Electronically Signed   By: Lucienne Capers M.D.   On: 10/20/2020 22:52   US Venous Img Lower Unilateral Right  Result Date: 10/20/2020 CLINICAL DATA:  Recent right knee arthroscopy with swelling for 3 days. Anticoagulation therapy. EXAM: Right LOWER EXTREMITY VENOUS DOPPLER ULTRASOUND TECHNIQUE: Gray-scale sonography with  compression, as well as color and duplex ultrasound, were performed to evaluate the deep venous system(s) from the level of the common femoral vein through the popliteal and proximal calf veins. COMPARISON:  None. FINDINGS: VENOUS Normal compressibility of the common femoral, superficial femoral, and popliteal veins, as well as the visualized calf veins. Visualized portions of profunda femoral vein and great saphenous vein unremarkable. No filling defects to suggest DVT on grayscale or color Doppler imaging. Doppler waveforms show normal direction of venous flow, normal respiratory plasticity and response to augmentation. Limited views of the contralateral common femoral vein are unremarkable. OTHER None. Limitations: none IMPRESSION: No evidence of deep venous thrombosis in the visualized right lower extremity veins. Electronically Signed   By: Lucienne Capers M.D.   On: 10/20/2020 22:09    Discharge Medications:   Allergies as of 10/24/2020      Reactions   Ace Inhibitors Swelling   Patient states her tongue swell.   Penicillins Anxiety   Has patient had a PCN reaction causing immediate rash, facial/tongue/throat swelling, SOB or  lightheadedness with hypotension: No Has patient had a PCN reaction causing severe rash involving mucus membranes or skin necrosis: No Has patient had a PCN reaction that required hospitalization No Has patient had a PCN reaction occurring within the last 10 years: No If all of the above answers are "NO", then may proceed with Cephalosporin use.      Medication List    STOP taking these medications   HYDROcodone-acetaminophen 5-325 MG tablet Commonly known as: Norco     TAKE these medications   albuterol 108 (90 Base) MCG/ACT inhaler Commonly known as: VENTOLIN HFA Inhale 2 puffs into the lungs every 6 (six) hours as needed for wheezing or shortness of breath.   amLODipine 5 MG tablet Commonly known as: NORVASC Take 1 tablet (5 mg total) by mouth in the morning and at bedtime. Hold evening dose if bp below 115/75   celecoxib 200 MG capsule Commonly known as: CELEBREX Take 1 capsule (200 mg total) by mouth 2 (two) times daily.   ciprofloxacin 500 MG tablet Commonly known as: Cipro Take 1 tablet (500 mg total) by mouth 2 (two) times daily.   diltiazem 30 MG tablet Commonly known as: Cardizem Take 1 tablet (30 mg total) by mouth every 6 (six) hours as needed (for tachycardia/recurrent Afib.).   doxycycline 100 MG tablet Commonly known as: ADOXA Take 1 tablet (100 mg total) by mouth 2 (two) times daily.   Eliquis 5 MG Tabs tablet Generic drug: apixaban TAKE 1 TABLET(5 MG) BY MOUTH TWICE DAILY   meclizine 25 MG tablet Commonly known as: ANTIVERT Take 25 mg by mouth 3 (three) times daily as needed (vertigo/dizziness).   ondansetron 4 MG disintegrating tablet Commonly known as: ZOFRAN-ODT Take 4 mg by mouth every 8 (eight) hours as needed for nausea or vomiting.   rosuvastatin 10 MG tablet Commonly known as: CRESTOR Take 1 tablet (10 mg total) by mouth daily. What changed: when to take this   traMADol 50 MG tablet Commonly known as: ULTRAM Take 1 tablet (50 mg  total) by mouth every 4 (four) hours as needed for moderate pain.       Disposition: home; unable to secure home health PT, patient will proceed with exercises at home     Follow-up Information    [facility]. Schedule an appointment as soon as possible for a visit in 1 week(s).   Specialty: Orthopedic Surgery Why: Call and  make appointment for follow-up in 1 week.  Contact information: [place] [address] [place], PA-C 10/24/2020, 2:21 PM

## 2020-10-24 NOTE — Care Management Important Message (Signed)
Important Message  Patient Details  Name: [name] MRN: [id number] Date of Birth: [birth year]   Medicare Important Message Given:  Yes     Juliann Pulse A Leda [place] 10/24/2020, 10:17 AM

## 2020-10-24 NOTE — Progress Notes (Signed)
Physical Therapy Treatment Patient Details Name: [name] MRN: [id number] DOB: 1940-11-19 Today's Date: 10/24/2020    History of Present Illness [name] is a 80 y.o. female who underwent right knee arthroscopy, partial medial meniscectomy, and chondroplasty on 10/08/2020.  She was recovering well with minimal pain and swelling.  However, last Friday she noted the onset of swelling and increased right knee pain.  She also had some calf pain.  She denied any fevers or chills. S/P repeat arthroscopy.    PT Comments    Patient received in bed, agrees to PT session. Reports improvement this morning. She is mod independent with bed mobility, transfers with min guard, ambulated 220 feet and up/down 4 steps with rails supervision. Patient's goals have been met at this time. She will benefit from PT follow up if able once discharged for strengthening, improving gait and overall independence.    Follow Up Recommendations  Home health PT;Outpatient PT     Equipment Recommendations  None recommended by PT    Recommendations for Other Services       Precautions / Restrictions Precautions Precaution Comments: mod fall Restrictions Weight Bearing Restrictions: No RLE Weight Bearing: Weight bearing as tolerated    Mobility  Bed Mobility Overal bed mobility: Modified Independent Bed Mobility: Supine to Sit     Supine to sit: Modified independent (Device/Increase time)          Transfers Overall transfer level: Needs assistance Equipment used: Rolling walker (2 wheeled) Transfers: Sit to/from Stand Sit to Stand: Min guard            Ambulation/Gait Ambulation/Gait assistance: Supervision Gait Distance (Feet): 220 Feet Assistive device: Rolling walker (2 wheeled) Gait Pattern/deviations: Step-through pattern;Shuffle Gait velocity: decr   General Gait Details: improved quality of gait this session.   Stairs Stairs: Yes Stairs assistance:  Supervision Stair Management: Two rails;Step to pattern Number of Stairs: 4 General stair comments: patient is safe with steps   Wheelchair Mobility    Modified Rankin (Stroke Patients Only)       Balance Overall balance assessment: Modified Independent Sitting-balance support: Feet supported Sitting balance-Leahy Scale: Good     Standing balance support: Bilateral upper extremity supported;During functional activity Standing balance-Leahy Scale: Good                              Cognition Arousal/Alertness: Awake/alert Behavior During Therapy: WFL for tasks assessed/performed Overall Cognitive Status: Within Functional Limits for tasks assessed                                        Exercises      General Comments        Pertinent Vitals/Pain Pain Assessment: No/denies pain    Home Living                      Prior Function            PT Goals (current goals can now be found in the care plan section) Acute Rehab PT Goals Patient Stated Goal: to return home PT Goal Formulation: With patient Time For Goal Achievement: 10/29/20 Potential to Achieve Goals: Good Progress towards PT goals: Progressing toward goals    Frequency    7X/week      PT Plan Current plan remains appropriate    Co-evaluation  AM-PAC PT "6 Clicks" Mobility   Outcome Measure  Help needed turning from your back to your side while in a flat bed without using bedrails?: None Help needed moving from lying on your back to sitting on the side of a flat bed without using bedrails?: None Help needed moving to and from a bed to a chair (including a wheelchair)?: A Little Help needed standing up from a chair using your arms (e.g., wheelchair or bedside chair)?: A Little Help needed to walk in hospital room?: None Help needed climbing 3-5 steps with a railing? : None 6 Click Score: 22    End of Session Equipment Utilized During  Treatment: Gait belt Activity Tolerance: Patient tolerated treatment well Patient left: in chair;with call bell/phone within reach Nurse Communication: Mobility status PT Visit Diagnosis: Muscle weakness (generalized) (M62.81)     Time: 1460-4799 PT Time Calculation (min) (ACUTE ONLY): 20 min  Charges:  $Gait Training: 8-22 mins                     Kimbery Harwood, PT, GCS 10/24/20,10:58 AM

## 2020-10-24 NOTE — Plan of Care (Signed)
  Problem: Health Behavior/Discharge Planning: Goal: Ability to manage health-related needs will improve Outcome: Progressing   

## 2020-10-25 LAB — BODY FLUID CULTURE W GRAM STAIN: Culture: NO GROWTH

## 2020-10-25 LAB — CULTURE, BLOOD (SINGLE)
Culture: NO GROWTH
Culture: NO GROWTH
Special Requests: ADEQUATE
Special Requests: ADEQUATE

## 2020-10-31 ENCOUNTER — Other Ambulatory Visit: Payer: Self-pay | Admitting: Family Medicine

## 2020-10-31 DIAGNOSIS — J4520 Mild intermittent asthma, uncomplicated: Secondary | ICD-10-CM

## 2020-11-13 ENCOUNTER — Telehealth: Payer: Self-pay

## 2020-11-13 NOTE — Progress Notes (Signed)
Chronic Care Management Pharmacy Assistant   Name: [name]  MRN: [id number] DOB: [birth year]  Reason for Encounter: Medication Review /General Adherence Call.   Recent office visits:  09/04/2020 Neldon Labella RN (CCM) 08/25/2020 Dr Ancil Boozer MD (PCP) Change Amlodipine from 10 mg to 5 mg BID-Hold evening dose if bp below 115/75   08/05/2020 Neldon Labella RN (CCM)  Recent consult visits:  09/16/2020 Dr. Marry Guan MD (Orthopedic surgery) 08/18/2020 Dr. Manuella Ghazi MD (Neurology) Stop amlodipine 5 mg 07/16/2020 Dr Fletcher Anon MD (Cardiology)   Hospital visits:  Medication Reconciliation was completed by comparing discharge summary, patient's EMR and Pharmacy list, and upon discussion with patient.  Admitted to the hospital on 10/20/2020 due to Arthritis of right knee due to other bacteria . Discharge date was 10/24/2020. Discharged from [place]?Medications Started at [facility] Discharge:?? -started doxycycline 100 mg BID, Ciprofloxacin 500 mg BID  Medication Changes at Hospital Discharge: -Changed none  Medications Discontinued at Hospital Discharge: -Stopped Hydrocodone-acetaminophen   Medications that remain the same after Hospital Discharge:??  -All other medications will remain the same.    Admitted to the hospital on 10/08/2020 due to S/P arthroscopy of right Knee. Discharge date was 10/08/2020. Discharged from [place]?Medications Started at [facility] Discharge:?? -started none  Medication Changes at Hospital Discharge: -Changed none  Medications Discontinued at Hospital Discharge: -Stopped none   Medications that remain the same after Hospital Discharge:??  -All other medications will remain the same.    Medications: Outpatient Encounter Medications as of 11/13/2020  Medication Sig  . albuterol (VENTOLIN HFA) 108 (90 Base) MCG/ACT inhaler INHALE 2 PUFFS INTO THE LUNGS EVERY 6 HOURS AS NEEDED FOR WHEEZING OR SHORTNESS  OF BREATH  . amLODipine (NORVASC) 5 MG tablet Take 1 tablet (5 mg total) by mouth in the morning and at bedtime. Hold evening dose if bp below 115/75  . apixaban (ELIQUIS) 5 MG TABS tablet TAKE 1 TABLET(5 MG) BY MOUTH TWICE DAILY  . celecoxib (CELEBREX) 200 MG capsule Take 1 capsule (200 mg total) by mouth 2 (two) times daily.  . ciprofloxacin (CIPRO) 500 MG tablet Take 1 tablet (500 mg total) by mouth 2 (two) times daily.  [name] diltiazem (CARDIZEM) 30 MG tablet Take 1 tablet (30 mg total) by mouth every 6 (six) hours as needed (for tachycardia/recurrent Afib.).  [name] doxycycline (ADOXA) 100 MG tablet Take 1 tablet (100 mg total) by mouth 2 (two) times daily.  . meclizine (ANTIVERT) 25 MG tablet Take 25 mg by mouth 3 (three) times daily as needed (vertigo/dizziness).  . ondansetron (ZOFRAN-ODT) 4 MG disintegrating tablet Take 4 mg by mouth every 8 (eight) hours as needed for nausea or vomiting.  . rosuvastatin (CRESTOR) 10 MG tablet Take 1 tablet (10 mg total) by mouth daily. (Patient taking differently: Take 10 mg by mouth at bedtime.)  . traMADol (ULTRAM) 50 MG tablet Take 1 tablet (50 mg total) by mouth every 4 (four) hours as needed for moderate pain.   No facility-administered encounter medications on file as of 11/13/2020.   Star Rating Drugs: Rosuvastatin 10 mg  Last filled on 08/25/2020 for 90 day supply at [facility].  Called patient and discussed medication adherence  with patient, no issues at this time with current medication.   Patient denies ED visit since her last CPP follow up.  Patient denies any side effects with her medication. Patient denies any problems with her current pharmacy  Attempted to  schedule patient a follow up telephone appointment with clinical pharmacist.Patient state she has to call back to scheduler the appointment after school is out for the summer.  [place] Pharmacist Assistant [phone]

## 2020-11-28 ENCOUNTER — Other Ambulatory Visit: Payer: Self-pay | Admitting: Family Medicine

## 2020-11-28 DIAGNOSIS — I10 Essential (primary) hypertension: Secondary | ICD-10-CM

## 2020-12-22 ENCOUNTER — Telehealth: Payer: Self-pay

## 2020-12-22 NOTE — Telephone Encounter (Signed)
  Care Management     12/22/2020 Name: [name] MRN: [id number] DOB: 09-Dec-1940   Primary Care Provider: Steele Sizer, MD   [name] called to request assistance with appointment/vaccine scheduling.  Assistance provided. She will receive the 2nd Covid booster today.     Follow Up Plan:  [name] will call if additional assistance is required.    [name] Care Management [facility] [phone]

## 2021-02-02 NOTE — Progress Notes (Signed)
Name: [name]   MRN: [id number]    DOB: May 25, 1941   Date:02/03/2021       Progress Note  Subjective  Chief Complaint  Follow Up  HPI  Left foot toe excoriation  she has very dry skin and 5 th shows a crack on her skin, we will give her Tdap today   HTN: bp is at goal on Norvasc 5 mg, no chest pain , seldom has palpitation but no SOB . BP is at goal   Pre-diabetes: last hgb A1C was 6.1 %  Denies polyphagia, polydipsia or polyuria . Trying to follow a healthy diet   History of right knee meniscus repair: had complication with right knee infection, we will recheck labs.    Paroxysmal Afib: sees Dr. Fletcher Anon, symptoms started August 2017, she is on Eliquis daily  and prn Cardizem, no need to take it lately. She is compliant with medications     History of CVA/TIA and also aneursym of left para ophthalmic artery , under the care of Dr. Manuella Ghazi - neurologist She is now taking Crestor and denies side effects.. She has occasional right frontal headache and soreness on right side of neck , no recent episoes    MR angio head without contrast from 01/23/2020 compared with previous one done  09/08/2018:   IMPRESSION: Stable appearance of 3 mm left paraophthalmic ICA aneurysm.   No new aneurysm or high-grade narrowing.   Asthma: she states symptoms are controlled a this time. She is not taking any medications at this time  OSA: seen by Dr. Manuella Ghazi - neurologist , unable to tolerate CPAP, sleep study done 06/30/2018 , she was supposed to have an oral appliance. She went to [facility] but insurance did not cover it. Supplies is too costly    Vertigo: she has vertigo with head movement, motion sickness, and when she has an episode she needs to go to [facility] by EMS because of severe nausea, vomiting and diarrhea 06/2016. She had one recent Summer of 2020 but resolved by itself . She states promethazine is more helpful than Meclizine prn. Episodes are sporadic, does not recall when she took meclizine last    Insomnia: she states a couple of times a week she has difficulty falling asleep.and takes Trazodone prn   Malnutrition: : she started  Fall 2021, weight was 181 lbs, however she states since knee surgery she has noticed decreased in appetite, she has lost 21 lbs since. Discussed importance of trying to gain some of her weight back.   Patient Active Problem List   Diagnosis Date Noted   Septic arthritis of knee, right ([place]) 10/21/2020   Osteoarthritis of right knee 05/05/2020   History of colonic polyps    Polyp of transverse colon    Aneurysm ([place]) 02/08/2018   History of CVA (cerebrovascular accident) without residual deficits 01/24/2018   Aneurysm of ophthalmic artery 01/24/2018   TIA (transient ischemic attack) 01/22/2018   Impingement syndrome of shoulder region 01/02/2018   History of cataract surgery, right 09/01/2016   Paroxysmal atrial fibrillation ([place]) 02/24/2016   Vertigo 02/24/2016   Low HDL (under 40) 02/17/2016   Dizziness 02/15/2016   Hx of colonic polyps    Benign neoplasm of sigmoid colon    Asthma, mild intermittent 02/21/2015   Hyperglycemia 02/21/2015   Essential hypertension 02/20/2015   Inconclusive mammogram due to dense breasts 01/02/2015   Dense breast 01/02/2015   Abnormal EKG 02/23/2013    Past Surgical History:  Procedure  Laterality Date   ABDOMINAL HYSTERECTOMY     CATARACT EXTRACTION Right 08/31/2016   COLONOSCOPY WITH PROPOFOL N/A 03/04/2015   Procedure: COLONOSCOPY WITH PROPOFOL;  Surgeon: Lucilla Lame, MD;  Location: [facility] ENDOSCOPY;  Service: Endoscopy;  Laterality: N/A;   COLONOSCOPY WITH PROPOFOL N/A 04/01/2020   Procedure: COLONOSCOPY WITH PROPOFOL;  Surgeon: Lucilla Lame, MD;  Location: [facility] ENDOSCOPY;  Service: Endoscopy;  Laterality: N/A;   DIAGNOSTIC LAPAROSCOPY     REMOVAL OF SCAR TISSUE (ADHESIONS)   KNEE ARTHROSCOPY Right 10/08/2020   Procedure: ARTHROSCOPY KNEE;  Surgeon: Dereck Leep, MD;  Location: [facility] ORS;  Service:  Orthopedics;  Laterality: Right;   KNEE ARTHROSCOPY Right 10/21/2020   Procedure: Arthroscopic irrigation and debridement right knee;  Surgeon: Dereck Leep, MD;  Location: [facility] ORS;  Service: Orthopedics;  Laterality: Right;    Family History  Problem Relation Age of Onset   COPD Father    Hypertension Mother    Aneurysm Mother    Hypertension Brother    Stroke Brother    Cancer Brother        pancreatic   Healthy Brother     Social History   Tobacco Use   Smoking status: Never   Smokeless tobacco: Never   Tobacco comments:    smoking cessation materials not required  Substance Use Topics   Alcohol use: No    Alcohol/week: 0.0 standard drinks     Current Outpatient Medications:    albuterol (VENTOLIN HFA) 108 (90 Base) MCG/ACT inhaler, INHALE 2 PUFFS INTO THE LUNGS EVERY 6 HOURS AS NEEDED FOR WHEEZING OR SHORTNESS OF BREATH, Disp: 6.7 g, Rfl: 0   amLODipine (NORVASC) 5 MG tablet, TAKE 1 TABLET(5 MG) BY MOUTH IN THE MORNING AND AT BEDTIME. HOLD EVENING DOSE IF BLOOD PRESSURE BELOW 115/75, Disp: 180 tablet, Rfl: 0   apixaban (ELIQUIS) 5 MG TABS tablet, TAKE 1 TABLET(5 MG) BY MOUTH TWICE DAILY, Disp: 60 tablet, Rfl: 6   diltiazem (CARDIZEM) 30 MG tablet, Take 1 tablet (30 mg total) by mouth every 6 (six) hours as needed (for tachycardia/recurrent Afib.)., Disp: 30 tablet, Rfl: 3   meclizine (ANTIVERT) 25 MG tablet, Take 25 mg by mouth 3 (three) times daily as needed (vertigo/dizziness)., Disp: , Rfl:    ondansetron (ZOFRAN-ODT) 4 MG disintegrating tablet, Take 4 mg by mouth every 8 (eight) hours as needed for nausea or vomiting., Disp: , Rfl:    rosuvastatin (CRESTOR) 10 MG tablet, Take 1 tablet (10 mg total) by mouth daily. (Patient taking differently: Take 10 mg by mouth at bedtime.), Disp: 90 tablet, Rfl: 1  Allergies  Allergen Reactions   Ace Inhibitors Swelling    Patient states her tongue swell.   Penicillins Anxiety    Has patient had a PCN reaction causing  immediate rash, facial/tongue/throat swelling, SOB or lightheadedness with hypotension: No Has patient had a PCN reaction causing severe rash involving mucus membranes or skin necrosis: No Has patient had a PCN reaction that required hospitalization No Has patient had a PCN reaction occurring within the last 10 years: No If all of the above answers are "NO", then may proceed with Cephalosporin use.    I personally reviewed active problem list, medication list, allergies, family history, social history, health maintenance with the patient/caregiver today.   ROS  Constitutional: Negative for fever , positive for weight change.  Respiratory: Negative for cough and shortness of breath.   Cardiovascular: Negative for chest pain or palpitations.  Gastrointestinal: Negative for abdominal pain,  no bowel changes.  Musculoskeletal: positive  for gait problem and right  joint swelling.  Skin: Negative for rash.  Neurological: Negative for dizziness or headache.  No other specific complaints in a complete review of systems (except as listed in HPI above).   Objective  Vitals:   02/03/21 0837  BP: 120/82  Pulse: 64  Resp: 16  Temp: 98 F (36.7 C)  SpO2: 97%  Weight: 160 lb (72.6 kg)  Height: '5\' 3"'$  (1.6 m)    Body mass index is 28.34 kg/m.  Physical Exam  Constitutional: Patient appears well-developed and well-nourished.  No distress.  HEENT: head atraumatic, normocephalic, pupils equal and reactive to light, neck supple, throat within normal limits Cardiovascular: Normal rate, regular rhythm and normal heart sounds.  No murmur heard. No BLE edema. Pulmonary/Chest: Effort normal and breath sounds normal. No respiratory distress. Abdominal: Soft.  There is no tenderness. Psychiatric: Patient has a normal mood and affect. behavior is normal. Judgment and thought content normal.  Skin: small excoriation on left 5th toe   PHQ2/9: Depression screen [facility] 2/9 02/03/2021 08/25/2020 07/15/2020  05/02/2020 02/22/2020  Decreased Interest 0 0 0 0 0  Down, Depressed, Hopeless 0 0 0 0 0  PHQ - 2 Score 0 0 0 0 0  Altered sleeping - - - - 0  Tired, decreased energy - - - - 0  Change in appetite - - - - 0  Feeling bad or failure about yourself  - - - - 0  Trouble concentrating - - - - 0  Moving slowly or fidgety/restless - - - - 0  Suicidal thoughts - - - - 0  PHQ-9 Score - - - - 0  Difficult doing work/chores - - - - -  Some recent data might be hidden    phq 9 is negative   Fall Risk: Fall Risk  02/03/2021 08/25/2020 07/15/2020 05/02/2020 02/22/2020  Falls in the past year? 0 0 0 0 0  Number falls in past yr: 0 0 0 0 0  Injury with Fall? 0 0 0 0 0  Risk for fall due to : Impaired balance/gait;Orthopedic patient - - - -  Risk for fall due to: Comment - - - - -  Follow up Falls prevention discussed - - - -     Assessment & Plan  1. OSA (obstructive sleep apnea)  Cannot afford supply   2. Aneurysm ([place])  Advised to follow up with Dr. Manuella Ghazi  3. History of TIA (transient ischemic attack)   4. Skin excoriation  Tdap today   5. History of CVA (cerebrovascular accident) without residual deficits  - Lipid panel - rosuvastatin (CRESTOR) 10 MG tablet; Take 1 tablet (10 mg total) by mouth at bedtime.  Dispense: 90 tablet; Refill: 1  6. Stage 3a chronic kidney disease (HCC)  - CBC with Differential/Platelet - COMPLETE METABOLIC PANEL WITH GFR  7. Pre-diabetes   8. Mild intermittent asthma without complication   9. Paroxysmal atrial fibrillation (HCC)   10. Dyslipidemia  - rosuvastatin (CRESTOR) 10 MG tablet; Take 1 tablet (10 mg total) by mouth at bedtime.  Dispense: 90 tablet; Refill: 1  11. Hyperglycemia  - Hemoglobin A1c  12. Essential hypertension  - amLODipine (NORVASC) 5 MG tablet; TAKE 1 TABLET(5 MG) BY MOUTH IN THE MORNING AND AT BEDTIME. HOLD EVENING DOSE IF BLOOD PRESSURE BELOW 115/75  Dispense: 180 tablet; Refill: 0  13. Need for Tdap  vaccination  - Tdap vaccine greater than or equal  to 7yo IM  14. Vitamin D deficiency  - VITAMIN D 25 Hydroxy (Vit-D Deficiency, Fractures)  15. Elevated C-reactive protein (CRP)  - C-reactive protein - Sedimentation rate  16. Mild protein-calorie malnutrition ([place])

## 2021-02-03 ENCOUNTER — Ambulatory Visit (INDEPENDENT_AMBULATORY_CARE_PROVIDER_SITE_OTHER): Payer: Medicare Other

## 2021-02-03 ENCOUNTER — Ambulatory Visit: Payer: Medicare Other

## 2021-02-03 ENCOUNTER — Encounter: Payer: Self-pay | Admitting: Family Medicine

## 2021-02-03 ENCOUNTER — Ambulatory Visit (INDEPENDENT_AMBULATORY_CARE_PROVIDER_SITE_OTHER): Payer: Medicare Other | Admitting: Family Medicine

## 2021-02-03 ENCOUNTER — Other Ambulatory Visit: Payer: Self-pay

## 2021-02-03 VITALS — BP 120/82 | HR 64 | Temp 98.0°F | Resp 16 | Ht 63.0 in | Wt 160.0 lb

## 2021-02-03 VITALS — BP 120/82 | HR 64 | Temp 98.0°F | Resp 16 | Ht 63.0 in | Wt 160.3 lb

## 2021-02-03 DIAGNOSIS — Z780 Asymptomatic menopausal state: Secondary | ICD-10-CM

## 2021-02-03 DIAGNOSIS — G4733 Obstructive sleep apnea (adult) (pediatric): Secondary | ICD-10-CM | POA: Diagnosis not present

## 2021-02-03 DIAGNOSIS — R7982 Elevated C-reactive protein (CRP): Secondary | ICD-10-CM | POA: Diagnosis not present

## 2021-02-03 DIAGNOSIS — I480 Paroxysmal atrial fibrillation: Secondary | ICD-10-CM

## 2021-02-03 DIAGNOSIS — T148XXA Other injury of unspecified body region, initial encounter: Secondary | ICD-10-CM

## 2021-02-03 DIAGNOSIS — N1831 Chronic kidney disease, stage 3a: Secondary | ICD-10-CM

## 2021-02-03 DIAGNOSIS — I729 Aneurysm of unspecified site: Secondary | ICD-10-CM | POA: Diagnosis not present

## 2021-02-03 DIAGNOSIS — R739 Hyperglycemia, unspecified: Secondary | ICD-10-CM | POA: Diagnosis not present

## 2021-02-03 DIAGNOSIS — Z0000 Encounter for general adult medical examination without abnormal findings: Secondary | ICD-10-CM | POA: Diagnosis not present

## 2021-02-03 DIAGNOSIS — Z8673 Personal history of transient ischemic attack (TIA), and cerebral infarction without residual deficits: Secondary | ICD-10-CM | POA: Diagnosis not present

## 2021-02-03 DIAGNOSIS — Z1231 Encounter for screening mammogram for malignant neoplasm of breast: Secondary | ICD-10-CM | POA: Diagnosis not present

## 2021-02-03 DIAGNOSIS — Z23 Encounter for immunization: Secondary | ICD-10-CM

## 2021-02-03 DIAGNOSIS — E441 Mild protein-calorie malnutrition: Secondary | ICD-10-CM

## 2021-02-03 DIAGNOSIS — R7303 Prediabetes: Secondary | ICD-10-CM | POA: Diagnosis not present

## 2021-02-03 DIAGNOSIS — I10 Essential (primary) hypertension: Secondary | ICD-10-CM | POA: Diagnosis not present

## 2021-02-03 DIAGNOSIS — J4520 Mild intermittent asthma, uncomplicated: Secondary | ICD-10-CM | POA: Diagnosis not present

## 2021-02-03 DIAGNOSIS — E559 Vitamin D deficiency, unspecified: Secondary | ICD-10-CM | POA: Diagnosis not present

## 2021-02-03 DIAGNOSIS — E785 Hyperlipidemia, unspecified: Secondary | ICD-10-CM | POA: Diagnosis not present

## 2021-02-03 DIAGNOSIS — G459 Transient cerebral ischemic attack, unspecified: Secondary | ICD-10-CM | POA: Diagnosis not present

## 2021-02-03 MED ORDER — AMLODIPINE BESYLATE 5 MG PO TABS: ORAL_TABLET | ORAL | 0 refills | Status: DC

## 2021-02-03 MED ORDER — ROSUVASTATIN CALCIUM 10 MG PO TABS: 10.0000 mg | ORAL_TABLET | Freq: Every day | ORAL | 1 refills | Status: DC

## 2021-02-03 NOTE — Patient Instructions (Signed)
[name] , Thank you for taking time to come for your Medicare Wellness Visit. I appreciate your ongoing commitment to your health goals. Please review the following plan we discussed and let me know if I can assist you in the future.   Screening recommendations/referrals: Colonoscopy: done 04/01/20. Repeat 03/2025 Mammogram: done 03/17/21. Please call [phone] to schedule your mammogram and bone density screening.  Bone Density: done 06/08/18 Recommended yearly ophthalmology/optometry visit for glaucoma screening and checkup Recommended yearly dental visit for hygiene and checkup  Vaccinations: Influenza vaccine: done 04/21/20 Pneumococcal vaccine: done 05/31/19 Tdap vaccine: done 02/22/12 Shingles vaccine: done 12/04/19; we will contact [facility] for your second dose information    Covid-19:done 07/05/19, 07/26/19, 04/23/20 & 12/22/20  Advanced directives: Please bring a copy of your health care power of attorney and living will to the office at your convenience.   Conditions/risks identified: Recommend increasing physical activity as tolerated  Next appointment: Follow up in one year for your annual wellness visit    Preventive Care 65 Years and Older, Female Preventive care refers to lifestyle choices and visits with your health care provider that can promote health and wellness. What does preventive care include? A yearly physical exam. This is also called an annual well check. Dental exams once or twice a year. Routine eye exams. Ask your health care provider how often you should have your eyes checked. Personal lifestyle choices, including: Daily care of your teeth and gums. Regular physical activity. Eating a healthy diet. Avoiding tobacco and drug use. Limiting alcohol use. Practicing safe sex. Taking low-dose aspirin every day. Taking vitamin and mineral supplements as recommended by your health care provider. What happens during an annual well check? The services and  screenings done by your health care provider during your annual well check will depend on your age, overall health, lifestyle risk factors, and family history of disease. Counseling  Your health care provider may ask you questions about your: Alcohol use. Tobacco use. Drug use. Emotional well-being. Home and relationship well-being. Sexual activity. Eating habits. History of falls. Memory and ability to understand (cognition). Work and work Statistician. Reproductive health. Screening  You may have the following tests or measurements: Height, weight, and BMI. Blood pressure. Lipid and cholesterol levels. These may be checked every 5 years, or more frequently if you are over 48 years old. Skin check. Lung cancer screening. You may have this screening every year starting at age 73 if you have a 30-pack-year history of smoking and currently smoke or have quit within the past 15 years. Fecal occult blood test (FOBT) of the stool. You may have this test every year starting at age 84. Flexible sigmoidoscopy or colonoscopy. You may have a sigmoidoscopy every 5 years or a colonoscopy every 10 years starting at age 72. Hepatitis C blood test. Hepatitis B blood test. Sexually transmitted disease (STD) testing. Diabetes screening. This is done by checking your blood sugar (glucose) after you have not eaten for a while (fasting). You may have this done every 1-3 years. Bone density scan. This is done to screen for osteoporosis. You may have this done starting at age 10. Mammogram. This may be done every 1-2 years. Talk to your health care provider about how often you should have regular mammograms. Talk with your health care provider about your test results, treatment options, and if necessary, the need for more tests. Vaccines  Your health care provider may recommend certain vaccines, such as: Influenza vaccine. This is recommended every year.  Tetanus, diphtheria, and acellular pertussis (Tdap,  Td) vaccine. You may need a Td booster every 10 years. Zoster vaccine. You may need this after age 11. Pneumococcal 13-valent conjugate (PCV13) vaccine. One dose is recommended after age 57. Pneumococcal polysaccharide (PPSV23) vaccine. One dose is recommended after age 81. Talk to your health care provider about which screenings and vaccines you need and how often you need them. This information is not intended to replace advice given to you by your health care provider. Make sure you discuss any questions you have with your health care provider. Document Released: 07/11/2015 Document Revised: 03/03/2016 Document Reviewed: 04/15/2015 Elsevier Interactive Patient Education  2017 [place] Prevention in the Home Falls can cause injuries. They can happen to people of all ages. There are many things you can do to make your home safe and to help prevent falls. What can I do on the outside of my home? Regularly fix the edges of walkways and driveways and fix any cracks. Remove anything that might make you trip as you walk through a door, such as a raised step or threshold. Trim any bushes or trees on the path to your home. Use bright outdoor lighting. Clear any walking paths of anything that might make someone trip, such as rocks or tools. Regularly check to see if handrails are loose or broken. Make sure that both sides of any steps have handrails. Any raised decks and porches should have guardrails on the edges. Have any leaves, snow, or ice cleared regularly. Use sand or salt on walking paths during winter. Clean up any spills in your garage right away. This includes oil or grease spills. What can I do in the bathroom? Use night lights. Install grab bars by the toilet and in the tub and shower. Do not use towel bars as grab bars. Use non-skid mats or decals in the tub or shower. If you need to sit down in the shower, use a plastic, non-slip stool. Keep the floor dry. Clean up any  water that spills on the floor as soon as it happens. Remove soap buildup in the tub or shower regularly. Attach bath mats securely with double-sided non-slip rug tape. Do not have throw rugs and other things on the floor that can make you trip. What can I do in the bedroom? Use night lights. Make sure that you have a light by your bed that is easy to reach. Do not use any sheets or blankets that are too big for your bed. They should not hang down onto the floor. Have a firm chair that has side arms. You can use this for support while you get dressed. Do not have throw rugs and other things on the floor that can make you trip. What can I do in the kitchen? Clean up any spills right away. Avoid walking on wet floors. Keep items that you use a lot in easy-to-reach places. If you need to reach something above you, use a strong step stool that has a grab bar. Keep electrical cords out of the way. Do not use floor polish or wax that makes floors slippery. If you must use wax, use non-skid floor wax. Do not have throw rugs and other things on the floor that can make you trip. What can I do with my stairs? Do not leave any items on the stairs. Make sure that there are handrails on both sides of the stairs and use them. Fix handrails that are broken or loose.  Make sure that handrails are as long as the stairways. Check any carpeting to make sure that it is firmly attached to the stairs. Fix any carpet that is loose or worn. Avoid having throw rugs at the top or bottom of the stairs. If you do have throw rugs, attach them to the floor with carpet tape. Make sure that you have a light switch at the top of the stairs and the bottom of the stairs. If you do not have them, ask someone to add them for you. What else can I do to help prevent falls? Wear shoes that: Do not have high heels. Have rubber bottoms. Are comfortable and fit you well. Are closed at the toe. Do not wear sandals. If you use a  stepladder: Make sure that it is fully opened. Do not climb a closed stepladder. Make sure that both sides of the stepladder are locked into place. Ask someone to hold it for you, if possible. Clearly mark and make sure that you can see: Any grab bars or handrails. First and last steps. Where the edge of each step is. Use tools that help you move around (mobility aids) if they are needed. These include: Canes. Walkers. Scooters. Crutches. Turn on the lights when you go into a dark area. Replace any light bulbs as soon as they burn out. Set up your furniture so you have a clear path. Avoid moving your furniture around. If any of your floors are uneven, fix them. If there are any pets around you, be aware of where they are. Review your medicines with your doctor. Some medicines can make you feel dizzy. This can increase your chance of falling. Ask your doctor what other things that you can do to help prevent falls. This information is not intended to replace advice given to you by your health care provider. Make sure you discuss any questions you have with your health care provider. Document Released: 04/10/2009 Document Revised: 11/20/2015 Document Reviewed: 07/19/2014 Elsevier Interactive Patient Education  2017 [employer].

## 2021-02-03 NOTE — Progress Notes (Signed)
Subjective:   [name] is a 80 y.o. female who presents for Medicare Annual (Subsequent) preventive examination.  Review of Systems     Cardiac Risk Factors include: advanced age (>51mn, >>53women);dyslipidemia;hypertension     Objective:    Today's Vitals   02/03/21 0810  BP: 120/82  Pulse: 64  Resp: 16  Temp: 98 F (36.7 C)  TempSrc: Oral  SpO2: 97%  Weight: 160 lb 4.8 oz (72.7 kg)  Height: '5\' 3"'$  (1.6 m)   Body mass index is 28.4 kg/m.  Advanced Directives 02/03/2021 10/21/2020 10/21/2020 10/08/2020 09/29/2020 05/05/2020 04/01/2020  Does Patient Have a Medical Advance Directive? Yes - Yes Yes Yes No Yes  Type of AParamedicof AGowandaLiving will - HBeechwood TrailsLiving will HHendersonvilleLiving will HCrawford[place]villeLiving will - [place]Living will  Does patient want to make changes to medical advance directive? - No - Patient declined - No - Patient declined No - Patient declined - -  Copy of HA[place]orain Chart? No - copy requested No - copy requested - No - copy requested No - copy requested - No - copy requested  Would patient like information on creating a medical advance directive? - - - - - No - Patient declined -    Current Medications (verified) Outpatient Encounter Medications as of 02/03/2021  Medication Sig   albuterol (VENTOLIN HFA) 108 (90 Base) MCG/ACT inhaler INHALE 2 PUFFS INTO THE LUNGS EVERY 6 HOURS AS NEEDED FOR WHEEZING OR SHORTNESS OF BREATH   amLODipine (NORVASC) 5 MG tablet TAKE 1 TABLET(5 MG) BY MOUTH IN THE MORNING AND AT BEDTIME. HOLD EVENING DOSE IF BLOOD PRESSURE BELOW 115/75   apixaban (ELIQUIS) 5 MG TABS tablet TAKE 1 TABLET(5 MG) BY MOUTH TWICE DAILY   diltiazem (CARDIZEM) 30 MG tablet Take 1 tablet (30 mg total) by mouth every 6 (six) hours as needed (for tachycardia/recurrent Afib.).   meclizine (ANTIVERT) 25 MG tablet Take 25 mg by  mouth 3 (three) times daily as needed (vertigo/dizziness).   ondansetron (ZOFRAN-ODT) 4 MG disintegrating tablet Take 4 mg by mouth every 8 (eight) hours as needed for nausea or vomiting.   rosuvastatin (CRESTOR) 10 MG tablet Take 1 tablet (10 mg total) by mouth daily. (Patient taking differently: Take 10 mg by mouth at bedtime.)   [DISCONTINUED] celecoxib (CELEBREX) 200 MG capsule Take 1 capsule (200 mg total) by mouth 2 (two) times daily.   [DISCONTINUED] ciprofloxacin (CIPRO) 500 MG tablet Take 1 tablet (500 mg total) by mouth 2 (two) times daily.   [DISCONTINUED] doxycycline (ADOXA) 100 MG tablet Take 1 tablet (100 mg total) by mouth 2 (two) times daily.   [DISCONTINUED] traMADol (ULTRAM) 50 MG tablet Take 1 tablet (50 mg total) by mouth every 4 (four) hours as needed for moderate pain.   No facility-administered encounter medications on file as of 02/03/2021.    Allergies (verified) Ace inhibitors and Penicillins   History: Past Medical History:  Diagnosis Date   History of stress test    a. 02/2013 Nl stress test.   Hypertension    Insomnia, unspecified    Mitral regurgitation    a. echo 01/2016: nl LV sys fxn, mild MR, w/o pulm htn, nl atrial size   Osteoporosis, unspecified    Paroxysmal atrial fibrillation ([place] 01/2016   a. diagnosed 01/2016; b. has been on eliquis, pradaxa, and xarelto at varying times 2/2 cost of each medication-->currently  on eliquis (11/2016)   Pure hypercholesterolemia    Sleep apnea    Unable to tolerate nocturnal PAP therapy   TIA (transient ischemic attack)    Unspecified asthma(493.90)    Vertigo    Past Surgical History:  Procedure Laterality Date   ABDOMINAL HYSTERECTOMY     CATARACT EXTRACTION Right 08/31/2016   COLONOSCOPY WITH PROPOFOL N/A 03/04/2015   Procedure: COLONOSCOPY WITH PROPOFOL;  Surgeon: Lucilla Lame, MD;  Location: [facility] ENDOSCOPY;  Service: Endoscopy;  Laterality: N/A;   COLONOSCOPY WITH PROPOFOL N/A 04/01/2020   Procedure:  COLONOSCOPY WITH PROPOFOL;  Surgeon: Lucilla Lame, MD;  Location: [facility] ENDOSCOPY;  Service: Endoscopy;  Laterality: N/A;   DIAGNOSTIC LAPAROSCOPY     REMOVAL OF SCAR TISSUE (ADHESIONS)   KNEE ARTHROSCOPY Right 10/08/2020   Procedure: ARTHROSCOPY KNEE;  Surgeon: Dereck Leep, MD;  Location: [facility] ORS;  Service: Orthopedics;  Laterality: Right;   KNEE ARTHROSCOPY Right 10/21/2020   Procedure: Arthroscopic irrigation and debridement right knee;  Surgeon: Dereck Leep, MD;  Location: [facility] ORS;  Service: Orthopedics;  Laterality: Right;   Family History  Problem Relation Age of Onset   COPD Father    Hypertension Mother    Aneurysm Mother    Hypertension Brother    Stroke Brother    Cancer Brother        pancreatic   Healthy Brother    Social History   Socioeconomic History   Marital status: Single    Spouse name: Not on file   Number of children: 2   Years of education: Not on file   Highest education level: Associate degree: academic program  Occupational History   Occupation: Retired  Tobacco Use   Smoking status: Never   Smokeless tobacco: Never   Tobacco comments:    smoking cessation materials not required  Vaping Use   Vaping Use: Never used  Substance and Sexual Activity   Alcohol use: No    Alcohol/week: 0.0 standard drinks   Drug use: No   Sexual activity: Not Currently    Partners: Male  Other Topics Concern   Not on file  Social History Narrative   Independent at baseline. Lives alone.    Social Determinants of Health   Financial Resource Strain: Low Risk    Difficulty of Paying Living Expenses: Not hard at all  Food Insecurity: No Food Insecurity   Worried About Charity fundraiser in the Last Year: Never true   [place] in the Last Year: Never true  Transportation Needs: No Transportation Needs   Lack of Transportation (Medical): No   Lack of Transportation (Non-Medical): No  Physical Activity: Insufficiently Active   Days of Exercise per  Week: 2 days   Minutes of Exercise per Session: 20 min  Stress: No Stress Concern Present   Feeling of Stress : Not at all  Social Connections: Moderately Integrated   Frequency of Communication with Friends and Family: More than three times a week   Frequency of Social Gatherings with Friends and Family: More than three times a week   Attends Religious Services: More than 4 times per year   Active Member of [employer] or Organizations: Yes   Attends Music therapist: More than 4 times per year   Marital Status: Never married    Tobacco Counseling Counseling given: Not Answered Tobacco comments: smoking cessation materials not required   Clinical Intake:  Pre-visit preparation completed: Yes  Pain : No/denies pain  BMI - recorded: 28.4 Nutritional Status: BMI 25 -29 Overweight Nutritional Risks: None Diabetes: No  How often do you need to have someone help you when you read instructions, pamphlets, or other written materials from your doctor or pharmacy?: 1 - Never    Interpreter Needed?: No  Information entered by :: Clemetine Marker LPN   Activities of Daily Living In your present state of health, do you have any difficulty performing the following activities: 02/03/2021 10/21/2020  Hearing? N N  Vision? N N  Difficulty concentrating or making decisions? N N  Walking or climbing stairs? N N  Dressing or bathing? N N  Doing errands, shopping? N N  Preparing Food and eating ? N -  Using the Toilet? N -  In the past six months, have you accidently leaked urine? N -  Do you have problems with loss of bowel control? N -  Managing your Medications? N -  Managing your Finances? N -  Housekeeping or managing your Housekeeping? N -  Some recent data might be hidden    Patient Care Team: Steele Sizer, MD as PCP - General (Family Medicine) Wellington Hampshire, MD as Consulting Physician (Cardiology) Vladimir Crofts, MD as Consulting Physician (Neurology) Lucilla Lame, MD as Consulting Physician (Gastroenterology) Germaine Pomfret, [facility] (Pharmacist)  Indicate any recent Medical Services you may have received from other than [facility] providers in the past year (date may be approximate).     Assessment:   This is a routine wellness examination for Dorlisa.  Hearing/Vision screen Hearing Screening - Comments:: Pt denies hearing difficulty Vision Screening - Comments:: Annual vision screenings done at [facility]; due for exam  Dietary issues and exercise activities discussed: Current Exercise Habits: Home exercise routine, Type of exercise: walking, Time (Minutes): 20, Frequency (Times/Week): 2, Weekly Exercise (Minutes/Week): 40, Intensity: Mild, Exercise limited by: orthopedic condition(s)   Goals Addressed             This Visit's Progress    DIET - INCREASE WATER INTAKE   On track    Recommend to drink at least 6-8 8oz glasses of water per day.        Depression Screen PHQ 2/9 Scores 02/03/2021 08/25/2020 07/15/2020 05/02/2020 02/22/2020 01/31/2020 11/23/2019  PHQ - 2 Score 0 0 0 0 0 0 0  PHQ- 9 Score - - - - 0 - 0    Fall Risk Fall Risk  02/03/2021 08/25/2020 07/15/2020 05/02/2020 02/22/2020  Falls in the past year? 0 0 0 0 0  Number falls in past yr: 0 0 0 0 0  Injury with Fall? 0 0 0 0 0  Risk for fall due to : Impaired balance/gait;Orthopedic patient - - - -  Risk for fall due to: Comment - - - - -  Follow up Falls prevention discussed - - - -    FALL RISK PREVENTION PERTAINING TO THE HOME:  Any stairs in or around the home? No  If so, are there any without handrails? No  Home free of loose throw rugs in walkways, pet beds, electrical cords, etc? Yes  Adequate lighting in your home to reduce risk of falls? Yes   ASSISTIVE DEVICES UTILIZED TO PREVENT FALLS:  Life alert? No  Use of a cane, walker or w/c? Yes  Grab bars in the bathroom? Yes  Shower chair or bench in shower? No  Elevated toilet seat or a handicapped toilet?  No   TIMED UP AND GO:  Was  the test performed? Yes .  Length of time to ambulate 10 feet: 6 sec.   Gait steady and fast with assistive device  Cognitive Function: Normal cognitive status assessed by direct observation by this Nurse Health Advisor. No abnormalities found.       6CIT Screen 01/30/2019 01/24/2018  What Year? 0 points 0 points  What month? 0 points 0 points  What time? 0 points 0 points  Count back from 20 0 points 0 points  Months in reverse 0 points 0 points  Repeat phrase 0 points 2 points  Total Score 0 2    Immunizations Immunization History  Administered Date(s) Administered   Fluad Quad(high Dose 65+) 04/16/2019, 04/21/2020   Influenza, High Dose Seasonal PF 02/20/2015, 05/11/2017, 05/04/2018   Influenza,inj,quad, With Preservative 03/28/2020   Influenza-Unspecified 02/20/2016   PFIZER(Purple Top)SARS-COV-2 Vaccination 07/05/2019, 07/26/2019, 04/23/2020, 12/22/2020   Pneumococcal Conjugate-13 10/17/2017   Pneumococcal Polysaccharide-23 05/31/2019   Tdap 02/22/2012   Zoster Recombinat (Shingrix) 12/04/2019    TDAP status: Up to date  Flu Vaccine status: Up to date  Pneumococcal vaccine status: Up to date  Covid-19 vaccine status: Completed vaccines  Qualifies for Shingles Vaccine? Yes   Zostavax completed No   Shingrix Completed?: Yes; will contact [facility] for second dose.   Screening Tests Health Maintenance  Topic Date Due   Zoster Vaccines- Shingrix (2 of 2) 01/29/2020   INFLUENZA VACCINE  01/26/2021   MAMMOGRAM  03/17/2021   TETANUS/TDAP  02/21/2022   COLONOSCOPY (Pts 45-38yr Insurance coverage will need to be confirmed)  04/01/2025   DEXA SCAN  Completed   COVID-19 Vaccine  Completed   Hepatitis C Screening  Completed   PNA vac Low Risk Adult  Completed   HPV VACCINES  Aged Out    Health Maintenance  Health Maintenance Due  Topic Date Due   Zoster Vaccines- Shingrix (2 of 2) 01/29/2020   INFLUENZA VACCINE  01/26/2021     Colorectal cancer screening: Type of screening: Colonoscopy. Completed 04/01/20. Repeat every 5 years  Mammogram status: Completed 03/17/20. Repeat every year. Ordered today  Bone Density status: Completed 06/08/18. Results reflect: Bone density results: NORMAL. Repeat every 2 years. Ordered today.   Lung Cancer Screening: (Low Dose CT Chest recommended if Age 78-80years, 30 pack-year currently smoking OR have quit w/in 15years.) does not qualify.   Additional Screening:  Hepatitis C Screening: does qualify; Completed 02/22/20  Vision Screening: Recommended annual ophthalmology exams for early detection of glaucoma and other disorders of the eye. Is the patient up to date with their annual eye exam?  No  Who is the provider or what [facility] the [facility]   Dental Screening: Recommended annual dental exams for proper oral hygiene  Community Resource Referral / Chronic Care Management: CRR required this visit?  No   CCM required this visit?  No      Plan:     I have personally reviewed and noted the following in the patient's chart:   Medical and social history Use of alcohol, tobacco or illicit drugs  Current medications and supplements including opioid prescriptions.  Functional ability and status Nutritional status Physical activity Advanced directives List of other physicians Hospitalizations, surgeries, and ER visits in previous 12 months Vitals Screenings to include cognitive, depression, and falls Referrals and appointments  In addition, I have reviewed and discussed with patient certain preventive protocols, quality metrics, and best practice  recommendations. A written personalized care plan for preventive services as well as general preventive health recommendations were provided to patient.     Clemetine Marker, LPN   [id number]   Nurse Notes: pt requesting handicap placard due to ongoing issues  with her knee. Pt to discuss with Dr. Ancil Boozer at visit today.

## 2021-02-04 LAB — CBC WITH DIFFERENTIAL/PLATELET
Absolute Monocytes: 403 {cells}/uL (ref 200–950)
Basophils Absolute: 32 {cells}/uL (ref 0–200)
Basophils Relative: 0.5 %
Eosinophils Absolute: 70 {cells}/uL (ref 15–500)
Eosinophils Relative: 1.1 %
HCT: 43.1 % (ref 35.0–45.0)
Hemoglobin: 14.3 g/dL (ref 11.7–15.5)
Lymphs Abs: 1722 {cells}/uL (ref 850–3900)
MCH: 30.4 pg (ref 27.0–33.0)
MCHC: 33.2 g/dL (ref 32.0–36.0)
MCV: 91.7 fL (ref 80.0–100.0)
MPV: 12.8 fL — ABNORMAL HIGH (ref 7.5–12.5)
Monocytes Relative: 6.3 %
Neutro Abs: 4173 {cells}/uL (ref 1500–7800)
Neutrophils Relative %: 65.2 %
Platelets: 290 10*3/uL (ref 140–400)
RBC: 4.7 Million/uL (ref 3.80–5.10)
RDW: 12.9 % (ref 11.0–15.0)
Total Lymphocyte: 26.9 %
WBC: 6.4 10*3/uL (ref 3.8–10.8)

## 2021-02-04 LAB — LIPID PANEL
Cholesterol: 110 mg/dL
HDL: 47 mg/dL — ABNORMAL LOW
LDL Cholesterol (Calc): 48 mg/dL
Non-HDL Cholesterol (Calc): 63 mg/dL
Total CHOL/HDL Ratio: 2.3 (calc)
Triglycerides: 72 mg/dL

## 2021-02-04 LAB — HEMOGLOBIN A1C
Hgb A1c MFr Bld: 5.7 %{Hb} — ABNORMAL HIGH
Mean Plasma Glucose: 117 mg/dL
eAG (mmol/L): 6.5 mmol/L

## 2021-02-04 LAB — COMPLETE METABOLIC PANEL WITHOUT GFR
AG Ratio: 1.3 (calc) (ref 1.0–2.5)
ALT: 19 U/L (ref 6–29)
AST: 19 U/L (ref 10–35)
Albumin: 4.3 g/dL (ref 3.6–5.1)
Alkaline phosphatase (APISO): 62 U/L (ref 37–153)
BUN/Creatinine Ratio: 19 (calc) (ref 6–22)
BUN: 21 mg/dL (ref 7–25)
CO2: 26 mmol/L (ref 20–32)
Calcium: 9.9 mg/dL (ref 8.6–10.4)
Chloride: 104 mmol/L (ref 98–110)
Creat: 1.12 mg/dL — ABNORMAL HIGH (ref 0.60–1.00)
Globulin: 3.4 g/dL (ref 1.9–3.7)
Glucose, Bld: 94 mg/dL (ref 65–99)
Potassium: 4.2 mmol/L (ref 3.5–5.3)
Sodium: 139 mmol/L (ref 135–146)
Total Bilirubin: 0.4 mg/dL (ref 0.2–1.2)
Total Protein: 7.7 g/dL (ref 6.1–8.1)
eGFR: 51 mL/min/{1.73_m2} — ABNORMAL LOW

## 2021-02-04 LAB — SEDIMENTATION RATE: Sed Rate: 62 mm/h — ABNORMAL HIGH (ref 0–30)

## 2021-02-04 LAB — VITAMIN D 25 HYDROXY (VIT D DEFICIENCY, FRACTURES): Vit D, 25-Hydroxy: 27 ng/mL — ABNORMAL LOW (ref 30–100)

## 2021-02-04 LAB — C-REACTIVE PROTEIN: CRP: 0.9 mg/L

## 2021-02-10 DIAGNOSIS — M1711 Unilateral primary osteoarthritis, right knee: Secondary | ICD-10-CM | POA: Diagnosis not present

## 2021-02-10 DIAGNOSIS — Z9889 Other specified postprocedural states: Secondary | ICD-10-CM | POA: Diagnosis not present

## 2021-02-11 ENCOUNTER — Other Ambulatory Visit: Payer: Self-pay | Admitting: Family Medicine

## 2021-02-11 DIAGNOSIS — I10 Essential (primary) hypertension: Secondary | ICD-10-CM

## 2021-02-11 NOTE — Telephone Encounter (Signed)
  Notes to clinic:  ZERO refills remain on this prescription. Your patient is requesting advance approval of refills for this medication to [facility]   Requested Prescriptions  Pending Prescriptions Disp Refills   amLODipine (NORVASC) 5 MG tablet [Pharmacy Med Name: AMLODIPINE BESYLATE '5MG'$  TABLETS] 180 tablet 0    Sig: TAKE 1 TABLET BY MOUTH IN THE MORNING AND AT BEDTIME. HOLD EVENING DOSE IF BLOOD PRESSURE IS BELOW 115/75     Cardiovascular:  Calcium Channel Blockers Passed - 02/11/2021  8:09 AM      Passed - Last BP in normal range    BP Readings from Last 1 Encounters:  02/03/21 120/82          Passed - Valid encounter within last 6 months    Recent Outpatient Visits           1 week ago Aneurysm [facility])   [facility] Steele Sizer, MD   5 months ago Aneurysm [facility])   [facility] Steele Sizer, MD   7 months ago Acute foot pain, right   [facility] Steele Sizer, MD   9 months ago Pain in right leg   [facility] Steele Sizer, MD   11 months ago Paroxysmal atrial fibrillation [facility])   [facility] Steele Sizer, MD       Future Appointments             In 2 months Ancil Boozer, Drue Stager, MD [facility], [place]   In 11 months  [facility], [facility]

## 2021-02-11 NOTE — Telephone Encounter (Signed)
Last seen 8.9.2022 upcoming sch'd for 11.10.2022

## 2021-02-16 DIAGNOSIS — H52223 Regular astigmatism, bilateral: Secondary | ICD-10-CM | POA: Diagnosis not present

## 2021-02-16 DIAGNOSIS — H04123 Dry eye syndrome of bilateral lacrimal glands: Secondary | ICD-10-CM | POA: Diagnosis not present

## 2021-02-16 DIAGNOSIS — H26492 Other secondary cataract, left eye: Secondary | ICD-10-CM | POA: Diagnosis not present

## 2021-02-16 DIAGNOSIS — H35033 Hypertensive retinopathy, bilateral: Secondary | ICD-10-CM | POA: Diagnosis not present

## 2021-02-19 ENCOUNTER — Other Ambulatory Visit: Payer: Self-pay

## 2021-02-19 ENCOUNTER — Ambulatory Visit: Payer: Medicare Other | Admitting: Cardiovascular Disease

## 2021-02-19 ENCOUNTER — Encounter: Payer: Self-pay | Admitting: Cardiovascular Disease

## 2021-02-19 VITALS — BP 110/70 | HR 58 | Ht 63.0 in | Wt 164.5 lb

## 2021-02-19 DIAGNOSIS — E785 Hyperlipidemia, unspecified: Secondary | ICD-10-CM | POA: Diagnosis not present

## 2021-02-19 DIAGNOSIS — I480 Paroxysmal atrial fibrillation: Secondary | ICD-10-CM

## 2021-02-19 DIAGNOSIS — I10 Essential (primary) hypertension: Secondary | ICD-10-CM

## 2021-02-19 NOTE — Patient Instructions (Signed)
Medication Instructions:  Your physician recommends that you continue on your current medications as directed. Please refer to the Current Medication list given to you today.  *If you need a refill on your cardiac medications before your next appointment, please call your pharmacy*   Lab Work: None ordered If you have labs (blood work) drawn today and your tests are completely normal, you will receive your results only by: [place] (if you have MyChart) OR A paper copy in the mail If you have any lab test that is abnormal or we need to change your treatment, we will call you to review the results.   Testing/Procedures: None ordered   Follow-Up: At [facility], you and your health needs are our priority.  As part of our continuing mission to provide you with exceptional heart care, we have created designated Provider Care Teams.  These Care Teams include your primary Cardiologist (physician) and Advanced Practice Providers (APPs -  Physician Assistants and Nurse Practitioners) who all work together to provide you with the care you need, when you need it.  We recommend signing up for the patient portal called "MyChart".  Sign up information is provided on this After Visit Summary.  MyChart is used to connect with patients for Virtual Visits (Telemedicine).  Patients are able to view lab/test results, encounter notes, upcoming appointments, etc.  Non-urgent messages can be sent to your provider as well.   To learn more about what you can do with MyChart, go to [email or web address].    Your next appointment:   Your physician wants you to follow-up in: 1 year You will receive a reminder letter in the mail two months in advance. If you don't receive a letter, please call our office to schedule the follow-up appointment.   The format for your next appointment:   In Person  Provider:   You may see Kathlyn Sacramento, MD or one of the following Advanced Practice Providers on your  designated Care Team:   Murray Hodgkins, NP Christell Faith, PA-C Marrianne Mood, PA-C Cadence Kathlen Mody, [place]   Other Instructions Medication Samples have been provided to you Drug name: Eliquis        Strength: 5 mg         Qty: 2 boxes   LOT: [id number]   Exp.Date: 10/2022 Dosing instructions: Take 1 tablet by mouth twice a day

## 2021-02-19 NOTE — Progress Notes (Signed)
Cardiology Office Note   Date:  02/19/2021   ID:  Kristin, Mayo 03/28/1941, MRN [id number]  PCP:  Steele Sizer, MD  Cardiologist:   Kathlyn Sacramento, MD   Chief Complaint  Patient presents with   Other    6 month f/u no complaints today. Meds reviewed verbally with pt.      History of Present Illness: [name] is a 80 y.o. female who presents for a follow-up visit regarding paroxysmal atrial fibrillation.  Atrial fibrillation happened in the past in the setting of severe vertigo and it was felt to be vagally mediated.  Other past medical history includes TIA, vertigo, small left thalamic artery aneurysm, essential hypertension, hyperlipidemia and insomnia. She was hospitalized in July of 2019 with spasms involving her left hand.  MRI of the brain showed no evidence of acute stroke.  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  She had no eye symptoms.  She had an echocardiogram done that showed normal LV systolic function with no cardiac source of emboli.  Carotid Doppler showed mild nonobstructive disease.   She had a Lexiscan Myoview in July 2020 that was normal.  It was done for atypical chest pain. Echocardiogram in July 2020 showed an EF of 55 to 60%, mild biatrial enlargement and trivial mitral regurgitation. She has been doing reasonably well from a cardiac standpoint with no chest pain, shortness of breath or palpitations.  She underwent right knee surgery in April but had swelling postoperatively with concerns for septic knee.  She underwent irrigation and debridement.  She is doing better in that regard.  Past Medical History:  Diagnosis Date   History of stress test    a. 02/2013 Nl stress test.   Hypertension    Insomnia, unspecified    Mitral regurgitation    a. echo 01/2016: nl LV sys fxn, mild MR, w/o pulm htn, nl atrial size   Osteoporosis, unspecified    Paroxysmal atrial fibrillation ([place]) 01/2016   a. diagnosed 01/2016; b.  has been on eliquis, pradaxa, and xarelto at varying times 2/2 cost of each medication-->currently on eliquis (11/2016)   Pure hypercholesterolemia    Sleep apnea    Unable to tolerate nocturnal PAP therapy   TIA (transient ischemic attack)    Unspecified asthma(493.90)    Vertigo     Past Surgical History:  Procedure Laterality Date   ABDOMINAL HYSTERECTOMY     CATARACT EXTRACTION Right 08/31/2016   COLONOSCOPY WITH PROPOFOL N/A 03/04/2015   Procedure: COLONOSCOPY WITH PROPOFOL;  Surgeon: Lucilla Lame, MD;  Location: [facility] ENDOSCOPY;  Service: Endoscopy;  Laterality: N/A;   COLONOSCOPY WITH PROPOFOL N/A 04/01/2020   Procedure: COLONOSCOPY WITH PROPOFOL;  Surgeon: Lucilla Lame, MD;  Location[facility] ENDOSCOPY;  Service: Endoscopy;  Laterality: N/A;   DIAGNOSTIC LAPAROSCOPY     REMOVAL OF SCAR TISSUE (ADHESIONS)   KNEE ARTHROSCOPY Right 10/08/2020   Procedure: ARTHROSCOPY KNEE;  Surgeon: Dereck Leep, MD;  Location: [facility] ORS;  Service: Orthopedics;  Laterality: Right;   KNEE ARTHROSCOPY Right 10/21/2020   Procedure: Arthroscopic irrigation and debridement right knee;  Surgeon: Dereck Leep, MD;  Location: [facility] ORS;  Service: Orthopedics;  Laterality: Right;     Current Outpatient Medications  Medication Sig Dispense Refill   albuterol (VENTOLIN HFA) 108 (90 Base) MCG/ACT inhaler INHALE 2 PUFFS INTO THE LUNGS EVERY 6 HOURS AS NEEDED FOR WHEEZING OR SHORTNESS OF BREATH 6.7 g 0   amLODipine (NORVASC) 5 MG tablet  TAKE 1 TABLET(5 MG) BY MOUTH IN THE MORNING AND AT BEDTIME. HOLD EVENING DOSE IF BLOOD PRESSURE BELOW 115/75 180 tablet 0   apixaban (ELIQUIS) 5 MG TABS tablet TAKE 1 TABLET(5 MG) BY MOUTH TWICE DAILY 60 tablet 6   diltiazem (CARDIZEM) 30 MG tablet Take 1 tablet (30 mg total) by mouth every 6 (six) hours as needed (for tachycardia/recurrent Afib.). 30 tablet 3   meclizine (ANTIVERT) 25 MG tablet Take 25 mg by mouth 3 (three) times daily as needed (vertigo/dizziness).     rosuvastatin  (CRESTOR) 10 MG tablet Take 1 tablet (10 mg total) by mouth at bedtime. 90 tablet 1   No current facility-administered medications for this visit.    Allergies:   Ace inhibitors and Penicillins    Social History:  The patient  reports that she has never smoked. She has never used smokeless tobacco. She reports that she does not drink alcohol and does not use drugs.   Family History:  The patient's family history includes Aneurysm in her mother; COPD in her father; Cancer in her brother; Healthy in her brother; Hypertension in her brother and mother; Stroke in her brother.    ROS:  Please see the history of present illness.   Otherwise, review of systems are positive for none.   All other systems are reviewed and negative.    PHYSICAL EXAM: VS:  BP 110/70 (BP Location: Left Arm, Patient Position: Sitting, Cuff Size: Normal)   Pulse (!) 58   Ht '5\' 3"'$  (1.6 m)   Wt 164 lb 8 oz (74.6 kg)   SpO2 98%   BMI 29.14 kg/m  , BMI Body mass index is 29.14 kg/m. GEN: Well nourished, well developed, in no acute distress  HEENT: normal  Neck: no JVD, carotid bruits, or masses Cardiac: RRR; no murmurs, rubs, or gallops, mild swelling of the right leg. Respiratory:  clear to auscultation bilaterally, normal work of breathing GI: soft, nontender, nondistended, + BS MS: no deformity or atrophy  Skin: warm and dry, no rash Neuro:  Strength and sensation are intact Psych: euthymic mood, full affect   EKG:  EKG is ordered today. EKG showed sinus bradycardia with no significant changes.  Recent Labs: 02/03/2021: ALT 19; BUN 21; Creat 1.12; Hemoglobin 14.3; Platelets 290; Potassium 4.2; Sodium 139    Lipid Panel    Component Value Date/Time   CHOL 110 02/03/2021 0916   CHOL 98 (L) 01/09/2019 1023   TRIG 72 02/03/2021 0916   HDL 47 (L) 02/03/2021 0916   HDL 39 (L) 01/09/2019 1023   CHOLHDL 2.3 02/03/2021 0916   VLDL 7 01/22/2018 1154   LDLCALC 48 02/03/2021 0916   LDLDIRECT 46 01/09/2019  1023      Wt Readings from Last 3 Encounters:  02/19/21 164 lb 8 oz (74.6 kg)  02/03/21 160 lb (72.6 kg)  02/03/21 160 lb 4.8 oz (72.7 kg)        PAD Screen 02/23/2016  Previous PAD dx? No  Previous surgical procedure? No  Pain with walking? No  Feet/toe relief with dangling? No  Painful, non-healing ulcers? No  Extremities discolored? No      ASSESSMENT AND PLAN:  1.   Paroxysmal atrial fibrillation: She is maintaining in sinus rhythm with no symptomatic episodes over the last year.  She is tolerating anticoagulation with Eliquis with no side effects.  I reviewed her recent labs.   2. Essential hypertension: Blood pressure is controlled on current medications.  3. Possible benign  positional vertigo.  Seems to be overall controlled.  4.  Hyperlipidemia: Currently on rosuvastatin.  Most recent lipid profile showed an LDL of 48.    Disposition:   FU with me in 12 months  Signed,  Kathlyn Sacramento, MD  02/19/2021 8:16 AM    [place]

## 2021-03-03 ENCOUNTER — Telehealth: Payer: Self-pay

## 2021-03-03 ENCOUNTER — Other Ambulatory Visit: Payer: Self-pay

## 2021-03-03 NOTE — Telephone Encounter (Signed)
Health form completed, patient will pick up this afternoon.

## 2021-03-04 ENCOUNTER — Telehealth: Payer: Self-pay | Admitting: *Deleted

## 2021-03-04 NOTE — Chronic Care Management (AMB) (Signed)
  Care Management   Note  03/04/2021 Name: [name] MRN: [id number] DOB: [birth year]  [name] is a 80 y.o. year old female who is a primary care patient of Steele Sizer, MD and is actively engaged with the care management team. I reached out to [name] by phone today to assist with scheduling an initial visit with the Pharmacist  Follow up plan: Unsuccessful telephone outreach attempt made. A HIPAA compliant phone message was left for the patient providing contact information and requesting a return call.   Julian Hy, [facility] Management  Direct Dial: [phone]

## 2021-03-06 NOTE — Chronic Care Management (AMB) (Signed)
  Care Management   Note  03/06/2021 Name: [name] MRN: [id number] DOB: 1941-03-06  [name] is a 80 y.o. year old female who is a primary care patient of Steele Sizer, MD and is actively engaged with the care management team. I reached out to [name] by phone today to assist with re-scheduling an initial visit with the Pharmacist  Follow up plan: Telephone appointment with care management team member scheduled for: 03/18/2021  Julian Hy, [place] [place] Management  Direct Dial: [phone]

## 2021-03-12 DIAGNOSIS — M1711 Unilateral primary osteoarthritis, right knee: Secondary | ICD-10-CM | POA: Diagnosis not present

## 2021-03-16 DIAGNOSIS — M1711 Unilateral primary osteoarthritis, right knee: Secondary | ICD-10-CM | POA: Diagnosis not present

## 2021-03-17 ENCOUNTER — Telehealth: Payer: Self-pay

## 2021-03-17 NOTE — Progress Notes (Signed)
    Chronic Care Management Pharmacy Assistant   Name: [name]  MRN: [id number] DOB: [birth year]  Patient called to be reminded of her appointment with Junius Argyle, CPP on 03/18/2021 @ 1530 via telephone.   No answer, left message of appointment date, time and type of appointment (either telephone or in person). Left message to have all medications, supplements, blood pressure and/or blood sugar logs available during appointment and to return call if need to reschedule.  Star Rating Drug: Rosuvastatin 10 mg last filled for a 90-Day supply with [facility]  Any gaps in medications fill history? No  Care Gaps: Zoster Vaccine Influenza Vaccine Mammogram   Lynann Bologna, CPA/CMA Clinical Pharmacist Assistant Phone: [phone]

## 2021-03-18 ENCOUNTER — Telehealth: Payer: Medicare Other

## 2021-03-19 ENCOUNTER — Ambulatory Visit
Admission: RE | Admit: 2021-03-19 | Discharge: 2021-03-19 | Disposition: A | Discharge status: Home / Self Care | Payer: Medicare Other | Source: Ambulatory Visit | Attending: Family Medicine | Admitting: Family Medicine

## 2021-03-19 ENCOUNTER — Other Ambulatory Visit: Payer: Self-pay

## 2021-03-19 DIAGNOSIS — Z780 Asymptomatic menopausal state: Secondary | ICD-10-CM | POA: Insufficient documentation

## 2021-03-19 DIAGNOSIS — Z1231 Encounter for screening mammogram for malignant neoplasm of breast: Secondary | ICD-10-CM | POA: Insufficient documentation

## 2021-03-19 DIAGNOSIS — M1712 Unilateral primary osteoarthritis, left knee: Secondary | ICD-10-CM | POA: Diagnosis not present

## 2021-03-29 ENCOUNTER — Other Ambulatory Visit: Payer: Self-pay

## 2021-03-29 ENCOUNTER — Emergency Department
Admission: EM | Admit: 2021-03-29 | Discharge: 2021-03-29 | Disposition: A | Discharge status: Home / Self Care | Payer: Medicare Other | Attending: Emergency Medicine | Admitting: Emergency Medicine

## 2021-03-29 ENCOUNTER — Emergency Department: Payer: Medicare Other

## 2021-03-29 DIAGNOSIS — Z96651 Presence of right artificial knee joint: Secondary | ICD-10-CM | POA: Diagnosis not present

## 2021-03-29 DIAGNOSIS — Z79899 Other long term (current) drug therapy: Secondary | ICD-10-CM | POA: Insufficient documentation

## 2021-03-29 DIAGNOSIS — I4891 Unspecified atrial fibrillation: Secondary | ICD-10-CM | POA: Diagnosis not present

## 2021-03-29 DIAGNOSIS — R1013 Epigastric pain: Secondary | ICD-10-CM | POA: Diagnosis present

## 2021-03-29 DIAGNOSIS — D72829 Elevated white blood cell count, unspecified: Secondary | ICD-10-CM | POA: Diagnosis not present

## 2021-03-29 DIAGNOSIS — I10 Essential (primary) hypertension: Secondary | ICD-10-CM | POA: Diagnosis not present

## 2021-03-29 DIAGNOSIS — K5732 Diverticulitis of large intestine without perforation or abscess without bleeding: Secondary | ICD-10-CM | POA: Diagnosis not present

## 2021-03-29 DIAGNOSIS — K6389 Other specified diseases of intestine: Secondary | ICD-10-CM | POA: Diagnosis not present

## 2021-03-29 DIAGNOSIS — I700 Atherosclerosis of aorta: Secondary | ICD-10-CM | POA: Diagnosis not present

## 2021-03-29 DIAGNOSIS — J4520 Mild intermittent asthma, uncomplicated: Secondary | ICD-10-CM | POA: Insufficient documentation

## 2021-03-29 DIAGNOSIS — K7689 Other specified diseases of liver: Secondary | ICD-10-CM | POA: Diagnosis not present

## 2021-03-29 LAB — COMPREHENSIVE METABOLIC PANEL WITH GFR
ALT: 23 U/L (ref 0–44)
AST: 18 U/L (ref 15–41)
Albumin: 3.7 g/dL (ref 3.5–5.0)
Alkaline Phosphatase: 61 U/L (ref 38–126)
Anion gap: 11 (ref 5–15)
BUN: 16 mg/dL (ref 8–23)
CO2: 25 mmol/L (ref 22–32)
Calcium: 9.4 mg/dL (ref 8.9–10.3)
Chloride: 100 mmol/L (ref 98–111)
Creatinine, Ser: 0.9 mg/dL (ref 0.44–1.00)
GFR, Estimated: >60 mL/min
Glucose, Bld: 144 mg/dL — ABNORMAL HIGH (ref 70–99)
Potassium: 3.8 mmol/L (ref 3.5–5.1)
Sodium: 136 mmol/L (ref 135–145)
Total Bilirubin: 1.1 mg/dL (ref 0.3–1.2)
Total Protein: 7.4 g/dL (ref 6.5–8.1)

## 2021-03-29 LAB — LIPASE, BLOOD: Lipase: 35 U/L (ref 11–51)

## 2021-03-29 LAB — TYPE AND SCREEN
ABO/RH(D): A POS
Antibody Screen: NEGATIVE

## 2021-03-29 LAB — CBC
HCT: 40.8 % (ref 36.0–46.0)
Hemoglobin: 14.1 g/dL (ref 12.0–15.0)
MCH: 31.5 pg (ref 26.0–34.0)
MCHC: 34.6 g/dL (ref 30.0–36.0)
MCV: 91.1 fL (ref 80.0–100.0)
Platelets: 338 10*3/uL (ref 150–400)
RBC: 4.48 MIL/uL (ref 3.87–5.11)
RDW: 13.2 % (ref 11.5–15.5)
WBC: 20.8 10*3/uL — ABNORMAL HIGH (ref 4.0–10.5)
nRBC: 0 % (ref 0.0–0.2)

## 2021-03-29 MED ORDER — AMOXICILLIN-POT CLAVULANATE 875-125 MG PO TABS
1.0000 | ORAL_TABLET | Freq: Once | ORAL | Status: AC
  Administered 2021-03-29: 1 via ORAL
  Filled 2021-03-29: qty 1

## 2021-03-29 MED ORDER — IOHEXOL 350 MG/ML SOLN
80.0000 mL | Freq: Once | INTRAVENOUS | Status: AC | PRN
  Administered 2021-03-29: 80 mL via INTRAVENOUS

## 2021-03-29 MED ORDER — SODIUM CHLORIDE 0.9 % IV BOLUS
1000.0000 mL | Freq: Once | INTRAVENOUS | Status: AC
  Administered 2021-03-29: 1000 mL via INTRAVENOUS

## 2021-03-29 MED ORDER — ONDANSETRON HCL 4 MG/2ML IJ SOLN
4.0000 mg | Freq: Once | INTRAMUSCULAR | Status: AC
  Administered 2021-03-29: 4 mg via INTRAVENOUS
  Filled 2021-03-29: qty 2

## 2021-03-29 MED ORDER — AMOXICILLIN-POT CLAVULANATE 875-125 MG PO TABS: 1.0000 | ORAL_TABLET | Freq: Two times a day (BID) | ORAL | 0 refills | Status: AC

## 2021-03-29 NOTE — ED Triage Notes (Signed)
Pt comes with c/o abdominal craps and some blood in stool this am.

## 2021-03-29 NOTE — Discharge Instructions (Addendum)
History ofYou have inflammation of your colon called diverticulitis.  Please take the antibiotic twice a day for the next week.  Turn to the emergency department include worsening pain, inability to tolerate the antibiotic or any food or drink or fevers.  Please follow-up with your primary care provider in about a week.

## 2021-03-29 NOTE — ED Provider Notes (Signed)
Emergency Medicine Provider Triage Evaluation Note  [name] , a 80 y.o. female  was evaluated in triage.  Pt complains of abdominal cramping and blood in stool.  Review of Systems  Positive: Decreased appetite, nausea, abdominal cramping, constipation and blood in stool Negative: Fever, chills, vomiting, diarrhea, urinary or vaginal issues.  Physical Exam  BP 112/73   Pulse 94   Temp 98 F (36.7 C)   Resp 18   SpO2 100%  Gen:   Awake, no distress   Resp:  Normal effort  Abd:   Soft, tender in bilateral lower quadrants.  Active bowel sounds. Neuro:  Alert and oriented.  Answering questions appropriately.  Medical Decision Making  Medically screening exam initiated at 9:25 AM.  Appropriate orders placed.  [name] was informed that the remainder of the evaluation will be completed by another provider, this initial triage assessment does not replace that evaluation, and the importance of remaining in the ED until their evaluation is complete.     Jearld Fenton, NP 03/29/21 4709    Lucrezia Starch, MD 03/29/21 [phone]

## 2021-03-29 NOTE — ED Provider Notes (Signed)
[facility]  ____________________________________________   Event Date/Time   First MD Initiated Contact with Patient 03/29/21 1024     (approximate)  I have reviewed the triage vital signs and the nursing notes.   HISTORY  Chief Complaint Abdominal Pain    HPI [name] is a 80 y.o. female with past medical history of atrial fibrillation on Eliquis, TIA, hypertension, hyperlipidemia who presents with abdominal pain.  Symptoms started about 4 days ago.  She endorses crampy lower abdominal pain that is intermittent.  She has associated constipation.  Has not had a bowel movement in 4 days where she usually goes daily.  Today she was straining trying to go she noticed some blood in the toilet without any stool.  She has had some intermittent nausea but no vomiting.  Decreased p.o. intake.  Denies urinary symptoms, fevers or chills.  Patient has a history of hysterectomy, also history of bowel obstruction many years ago.         Past Medical History:  Diagnosis Date   History of stress test    a. 02/2013 Nl stress test.   Hypertension    Insomnia, unspecified    Mitral regurgitation    a. echo 01/2016: nl LV sys fxn, mild MR, w/o pulm htn, nl atrial size   Osteoporosis, unspecified    Paroxysmal atrial fibrillation ([place]) 01/2016   a. diagnosed 01/2016; b. has been on eliquis, pradaxa, and xarelto at varying times 2/2 cost of each medication-->currently on eliquis (11/2016)   Pure hypercholesterolemia    Sleep apnea    Unable to tolerate nocturnal PAP therapy   TIA (transient ischemic attack)    Unspecified asthma(493.90)    Vertigo     Patient Active Problem List   Diagnosis Date Noted   Septic arthritis of knee, right ([place]) 10/21/2020   Osteoarthritis of right knee 05/05/2020   History of colonic polyps    Polyp of transverse colon    Aneurysm ([place]) 02/08/2018   History of CVA (cerebrovascular accident) without residual deficits 01/24/2018    Aneurysm of ophthalmic artery 01/24/2018   TIA (transient ischemic attack) 01/22/2018   Impingement syndrome of shoulder region 01/02/2018   History of cataract surgery, right 09/01/2016   Paroxysmal atrial fibrillation ([place]) 02/24/2016   Vertigo 02/24/2016   Low HDL (under 40) 02/17/2016   Dizziness 02/15/2016   Hx of colonic polyps    Benign neoplasm of sigmoid colon    Asthma, mild intermittent 02/21/2015   Hyperglycemia 02/21/2015   Essential hypertension 02/20/2015   Inconclusive mammogram due to dense breasts 01/02/2015   Dense breast 01/02/2015   Abnormal EKG 02/23/2013    Past Surgical History:  Procedure Laterality Date   ABDOMINAL HYSTERECTOMY     CATARACT EXTRACTION Right 08/31/2016   COLONOSCOPY WITH PROPOFOL N/A 03/04/2015   Procedure: COLONOSCOPY WITH PROPOFOL;  Surgeon: Lucilla Lame, MD;  Location: [facility] ENDOSCOPY;  Service: Endoscopy;  Laterality: N/A;   COLONOSCOPY WITH PROPOFOL N/A 04/01/2020   Procedure: COLONOSCOPY WITH PROPOFOL;  Surgeon: Lucilla Lame, MD;  Location: [facility] ENDOSCOPY;  Service: Endoscopy;  Laterality: N/A;   DIAGNOSTIC LAPAROSCOPY     REMOVAL OF SCAR TISSUE (ADHESIONS)   KNEE ARTHROSCOPY Right 10/08/2020   Procedure: ARTHROSCOPY KNEE;  Surgeon: Dereck Leep, MD;  Location: [facility] ORS;  Service: Orthopedics;  Laterality: Right;   KNEE ARTHROSCOPY Right 10/21/2020   Procedure: Arthroscopic irrigation and debridement right knee;  Surgeon: Dereck Leep, MD;  Location: [facility] ORS;  Service:  Orthopedics;  Laterality: Right;    Prior to Admission medications   Medication Sig Start Date End Date Taking? Authorizing Provider  amoxicillin-clavulanate (AUGMENTIN) 875-125 MG tablet Take 1 tablet by mouth 2 (two) times daily for 7 days. 03/29/21 04/05/21 Yes Rada Hay, MD  albuterol (VENTOLIN HFA) 108 (90 Base) MCG/ACT inhaler INHALE 2 PUFFS INTO THE LUNGS EVERY 6 HOURS AS NEEDED FOR WHEEZING OR SHORTNESS OF BREATH 10/31/20   Ancil Boozer, Drue Stager, MD   amLODipine (NORVASC) 5 MG tablet TAKE 1 TABLET(5 MG) BY MOUTH IN THE MORNING AND AT BEDTIME. HOLD EVENING DOSE IF BLOOD PRESSURE BELOW 115/75 02/03/21   Steele Sizer, MD  amLODipine (NORVASC) 5 MG tablet Take by mouth. 02/04/21   [provider]  apixaban (ELIQUIS) 5 MG TABS tablet TAKE 1 TABLET(5 MG) BY MOUTH TWICE DAILY 09/29/20   Wellington Hampshire, MD  diltiazem (CARDIZEM) 30 MG tablet Take 1 tablet (30 mg total) by mouth every 6 (six) hours as needed (for tachycardia/recurrent Afib.). 02/16/16   Theora Gianotti, NP  meclizine (ANTIVERT) 25 MG tablet Take 25 mg by mouth 3 (three) times daily as needed (vertigo/dizziness).    [provider]  rosuvastatin (CRESTOR) 10 MG tablet Take 1 tablet (10 mg total) by mouth at bedtime. 02/03/21   Steele Sizer, MD    Allergies Ace inhibitors and Penicillins  Family History  Problem Relation Age of Onset   COPD Father    Hypertension Mother    Aneurysm Mother    Hypertension Brother    Stroke Brother    Cancer Brother        pancreatic   Healthy Brother     Social History Social History   Tobacco Use   Smoking status: Never   Smokeless tobacco: Never   Tobacco comments:    smoking cessation materials not required  Vaping Use   Vaping Use: Never used  Substance Use Topics   Alcohol use: No    Alcohol/week: 0.0 standard drinks   Drug use: No    Review of Systems   Review of Systems  Constitutional:  Positive for appetite change. Negative for chills, fatigue and fever.  Respiratory:  Negative for shortness of breath.   Cardiovascular:  Negative for chest pain.  Gastrointestinal:  Positive for abdominal pain, anal bleeding, constipation and nausea. Negative for diarrhea and vomiting.  Genitourinary:  Negative for dysuria.  All other systems reviewed and are negative.  Physical Exam Updated Vital Signs BP 112/73   Pulse 94   Temp 98 F (36.7 C)   Resp 18   SpO2 100%   Physical Exam Vitals and  nursing note reviewed.  Constitutional:      General: She is not in acute distress.    Appearance: Normal appearance.  HENT:     Head: Normocephalic and atraumatic.     Comments: Dry mucous membranes Eyes:     General: No scleral icterus.    Conjunctiva/sclera: Conjunctivae normal.  Pulmonary:     Effort: Pulmonary effort is normal. No respiratory distress.     Breath sounds: No stridor.  Abdominal:     Tenderness: There is abdominal tenderness. There is no guarding.     Comments: Abdomen is soft, mildly tender in the epigastrium and suprapubic region, no focal right lower or left lower quadrant tenderness, no guarding  Musculoskeletal:        General: No deformity or signs of injury.     Cervical back: Normal range of motion.  Skin:  General: Skin is dry.     Coloration: Skin is not jaundiced or pale.  Neurological:     General: No focal deficit present.     Mental Status: She is alert and oriented to person, place, and time. Mental status is at baseline.  Psychiatric:        Mood and Affect: Mood normal.        Behavior: Behavior normal.     LABS (all labs ordered are listed, but only abnormal results are displayed)  Labs Reviewed  COMPREHENSIVE METABOLIC PANEL - Abnormal; Notable for the following components:      Result Value   Glucose, Bld 144 (*)    All other components within normal limits  CBC - Abnormal; Notable for the following components:   WBC 20.8 (*)    All other components within normal limits  LIPASE, BLOOD  URINALYSIS, COMPLETE (UACMP) WITH MICROSCOPIC  TYPE AND SCREEN   ____________________________________________  EKG  N/a ____________________________________________  RADIOLOGY Almeta Monas, personally viewed and evaluated these images (plain radiographs) as part of my medical decision making, as well as reviewing the written report by the radiologist.  ED MD interpretation: I reviewed the CT abdomen pelvis with contrast which shows  acute sigmoid diverticulitis without perforation or abscess    ____________________________________________   PROCEDURES  Procedure(s) performed (including Critical Care):  Procedures   ____________________________________________   INITIAL IMPRESSION / ASSESSMENT AND PLAN / ED COURSE     Patient is a 80 year old female presenting with 4 days of abdominal pain and constipation.  Vital signs within normal limits.  Her labs obtained in triage show leukocytosis of 20 but otherwise within normal limits.  On exam she overall appears well.  Her abdomen is rather benign, soft but she is tender in the suprapubic and epigastrium.  Patient has a history of bowel obstruction prior abdominal surgeries.  I am most concerned for bowel obstruction today.  Will obtain CT on pelvis with contrast, give fluids and Zofran.  CT abdomen pelvis shows acute sigmoid diverticulitis without abscess or perforation.  On repeat evaluation patient is still well-appearing, her pain is well controlled she is tolerating p.o.  I think she is appropriate for outpatient antibiotics.  She agrees and is comfortable with the plan.  Will discharge with 7-day course of Augmentin and PCP follow-up.  We discussed return precautions for fevers inability to tolerate p.o. etc.   ____________________________________________   FINAL CLINICAL IMPRESSION(S) / ED DIAGNOSES  Final diagnoses:  Sigmoid diverticulitis     ED Discharge Orders          Ordered    amoxicillin-clavulanate (AUGMENTIN) 875-125 MG tablet  2 times daily        03/29/21 1150             Note:  This document was prepared using Dragon voice recognition software and may include unintentional dictation errors.    Rada Hay, MD 03/29/21 [phone]

## 2021-04-01 ENCOUNTER — Ambulatory Visit (INDEPENDENT_AMBULATORY_CARE_PROVIDER_SITE_OTHER): Payer: Medicare Other | Admitting: Family Medicine

## 2021-04-01 ENCOUNTER — Encounter: Payer: Self-pay | Admitting: Family Medicine

## 2021-04-01 ENCOUNTER — Other Ambulatory Visit: Payer: Self-pay

## 2021-04-01 VITALS — BP 134/86 | HR 81 | Temp 98.0°F | Resp 16 | Ht 63.0 in | Wt 164.0 lb

## 2021-04-01 DIAGNOSIS — K769 Liver disease, unspecified: Secondary | ICD-10-CM

## 2021-04-01 DIAGNOSIS — Z23 Encounter for immunization: Secondary | ICD-10-CM

## 2021-04-01 DIAGNOSIS — K5732 Diverticulitis of large intestine without perforation or abscess without bleeding: Secondary | ICD-10-CM | POA: Diagnosis not present

## 2021-04-01 NOTE — Progress Notes (Signed)
Name: [name]   MRN: [id number]    DOB: 08-Dec-1940   Date:04/01/2021       Progress Note  Subjective  Chief Complaint  ER Follow Up  HPI  Acute diverticulitis: she noticed constipation, fatigue, and lower abdominal pain described as cramping and in wave,  followed by blood and mucus when she strained on 03/29/2021, she went to [facility] and had CT , CBC, comp panel. She had very high white count , CT showed lesion on liver and acute sigmoid diverticulitis. Since she went home still no regular bowel movement, abdominal pain has resolved, appetite is gradually improving, had chills two nights ago but doing better now. Taking Augmenting as prescribed.   She states one episode of diverticulitis in the past and also had one history of bowel obstruction that required surgery in the 80's   Patient Active Problem List   Diagnosis Date Noted   Osteoarthritis of right knee 05/05/2020   History of colonic polyps    Polyp of transverse colon    Aneurysm ([place]) 02/08/2018   History of CVA (cerebrovascular accident) without residual deficits 01/24/2018   Aneurysm of ophthalmic artery 01/24/2018   TIA (transient ischemic attack) 01/22/2018   Impingement syndrome of shoulder region 01/02/2018   History of cataract surgery, right 09/01/2016   Paroxysmal atrial fibrillation ([place]) 02/24/2016   Vertigo 02/24/2016   Low HDL (under 40) 02/17/2016   Dizziness 02/15/2016   Hx of colonic polyps    Benign neoplasm of sigmoid colon    Asthma, mild intermittent 02/21/2015   Hyperglycemia 02/21/2015   Essential hypertension 02/20/2015   Inconclusive mammogram due to dense breasts 01/02/2015   Dense breast 01/02/2015   Abnormal EKG 02/23/2013    Past Surgical History:  Procedure Laterality Date   ABDOMINAL HYSTERECTOMY     CATARACT EXTRACTION Right 08/31/2016   COLONOSCOPY WITH PROPOFOL N/A 03/04/2015   Procedure: COLONOSCOPY WITH PROPOFOL;  Surgeon: Lucilla Lame, MD;  Location: [facility] ENDOSCOPY;   Service: Endoscopy;  Laterality: N/A;   COLONOSCOPY WITH PROPOFOL N/A 04/01/2020   Procedure: COLONOSCOPY WITH PROPOFOL;  Surgeon: Lucilla Lame, MD;  [facility] END[facility];  Service: Endoscopy;  Laterality: N/A;   DIAGNOSTIC LAPAROSCOPY     REMOVAL OF SCAR TISSUE (ADHESIONS)   KNEE ARTHROSCOPY Right 10/08/2020   Procedure: ARTHROSCOPY KNEE;  Surgeon: Dereck Leep, MD;  Location: [facility] ORS;  Service: Orthopedics;  Laterality: Right;   KNEE ARTHROSCOPY Right 10/21/2020   Procedure: Arthroscopic irrigation and debridement right knee;  Surgeon: Dereck Leep, MD;  Location: [facility] ORS;  Service: Orthopedics;  Laterality: Right;    Family History  Problem Relation Age of Onset   COPD Father    Hypertension Mother    Aneurysm Mother    Hypertension Brother    Stroke Brother    Cancer Brother        pancreatic   Healthy Brother     Social History   Tobacco Use   Smoking status: Never   Smokeless tobacco: Never   Tobacco comments:    smoking cessation materials not required  Substance Use Topics   Alcohol use: No    Alcohol/week: 0.0 standard drinks     Current Outpatient Medications:    albuterol (VENTOLIN HFA) 108 (90 Base) MCG/ACT inhaler, INHALE 2 PUFFS INTO THE LUNGS EVERY 6 HOURS AS NEEDED FOR WHEEZING OR SHORTNESS OF BREATH, Disp: 6.7 g, Rfl: 0   amLODipine (NORVASC) 5 MG tablet, TAKE 1 TABLET(5 MG) BY MOUTH IN THE MORNING  AND AT BEDTIME. HOLD EVENING DOSE IF BLOOD PRESSURE BELOW 115/75, Disp: 180 tablet, Rfl: 0   amLODipine (NORVASC) 5 MG tablet, Take by mouth., Disp: , Rfl:    amoxicillin-clavulanate (AUGMENTIN) 875-125 MG tablet, Take 1 tablet by mouth 2 (two) times daily for 7 days., Disp: 14 tablet, Rfl: 0   apixaban (ELIQUIS) 5 MG TABS tablet, TAKE 1 TABLET(5 MG) BY MOUTH TWICE DAILY, Disp: 60 tablet, Rfl: 6   diltiazem (CARDIZEM) 30 MG tablet, Take 1 tablet (30 mg total) by mouth every 6 (six) hours as needed (for tachycardia/recurrent Afib.)., Disp: 30 tablet, Rfl:  3   meclizine (ANTIVERT) 25 MG tablet, Take 25 mg by mouth 3 (three) times daily as needed (vertigo/dizziness)., Disp: , Rfl:    rosuvastatin (CRESTOR) 10 MG tablet, Take 1 tablet (10 mg total) by mouth at bedtime., Disp: 90 tablet, Rfl: 1  Allergies  Allergen Reactions   Ace Inhibitors Swelling    Patient states her tongue swell.   Penicillins Anxiety    Has patient had a PCN reaction causing immediate rash, facial/tongue/throat swelling, SOB or lightheadedness with hypotension: No Has patient had a PCN reaction causing severe rash involving mucus membranes or skin necrosis: No Has patient had a PCN reaction that required hospitalization No Has patient had a PCN reaction occurring within the last 10 years: No If all of the above answers are "NO", then may proceed with Cephalosporin use.    I personally reviewed active problem list, medication list, allergies, family history, social history, health maintenance with the patient/caregiver today.   ROS  Ten systems reviewed and is negative except as mentioned in HPI   Objective  Vitals:   04/01/21 0946  BP: 134/86  Pulse: 81  Resp: 16  Temp: 98 F (36.7 C)  SpO2: 98%  Weight: 164 lb (74.4 kg)  Height: _0  (1.6 m)    Body mass index is 29.05 kg/m.  Physical Exam  Constitutional: Patient appears well-developed and well-nourished.  No distress.  HEENT: head atraumatic, normocephalic, pupils equal and reactive to light, neck supple Cardiovascular: Normal rate, regular rhythm and normal heart sounds.  No murmur heard. No BLE edema. Pulmonary/Chest: Effort normal and breath sounds normal. No respiratory distress. Abdominal: Soft.  There is mild LLQ discomfort, no guarding or rebound  tenderness. Normal bowel sounds.  Psychiatric: Patient has a normal mood and affect. behavior is normal. Judgment and thought content normal.   Recent Results (from the past 2160 hour(s))  Lipid panel     Status: Abnormal   Collection Time:  02/03/21  9:16 AM  Result Value Ref Range   Cholesterol 110 <200 mg/dL   HDL 47 (L) > OR = 50 mg/dL   Triglycerides 72 <150 mg/dL   LDL Cholesterol (Calc) 48 mg/dL (calc)    Comment: Reference range: <100 . Desirable range <100 mg/dL for primary prevention;   <70 mg/dL for patients with CHD or diabetic patients  with > or = 2 CHD risk factors. [name] LDL-C is now calculated using the Martin-Hopkins  calculation, which is a validated novel method providing  better accuracy than the Friedewald equation in the  estimation of LDL-C.  Cresenciano Genre et al. Annamaria Helling. 8469;629(52): 2061-2068  (http://education.[facility].com/faq/FAQ164)    Total CHOL/HDL Ratio 2.3 <5.0 (calc)   Non-HDL Cholesterol (Calc) 63 <130 mg/dL (calc)    Comment: For patients with diabetes plus 1 major ASCVD risk  factor, treating to a non-HDL-C goal of <100 mg/dL  (LDL-C of <70 mg/dL) is  considered a therapeutic  option.   CBC with Differential/Platelet     Status: Abnormal   Collection Time: 02/03/21  9:16 AM  Result Value Ref Range   WBC 6.4 3.8 - 10.8 Thousand/uL   RBC 4.70 3.80 - 5.10 Million/uL   Hemoglobin 14.3 11.7 - 15.5 g/dL   HCT 43.1 35.0 - 45.0 %   MCV 91.7 80.0 - 100.0 fL   MCH 30.4 27.0 - 33.0 pg   MCHC 33.2 32.0 - 36.0 g/dL   RDW 12.9 11.0 - 15.0 %   Platelets 290 140 - 400 Thousand/uL   MPV 12.8 (H) 7.5 - 12.5 fL   Neutro Abs 4,173 1,500 - 7,800 cells/uL   Lymphs Abs 1,722 850 - 3,900 cells/uL   Absolute Monocytes 403 200 - 950 cells/uL   Eosinophils Absolute 70 15 - 500 cells/uL   Basophils Absolute 32 0 - 200 cells/uL   Neutrophils Relative % 65.2 %   Total Lymphocyte 26.9 %   Monocytes Relative 6.3 %   Eosinophils Relative 1.1 %   Basophils Relative 0.5 %  COMPLETE METABOLIC PANEL WITH GFR     Status: Abnormal   Collection Time: 02/03/21  9:16 AM  Result Value Ref Range   Glucose, Bld 94 65 - 99 mg/dL    Comment: .            Fasting reference interval .    BUN 21 7 - 25 mg/dL    Creat 1.12 (H) 0.60 - 1.00 mg/dL   eGFR 51 (L) > OR = 60 mL/min/1.13m    Comment: The eGFR is based on the CKD-EPI 2021 equation. To calculate  the new eGFR from a previous Creatinine or Cystatin C result, go to https://www.kidney.org/professionals/ kdoqi/gfr%5Fcalculator    BUN/Creatinine Ratio 19 6 - 22 (calc)   Sodium 139 135 - 146 mmol/L   Potassium 4.2 3.5 - 5.3 mmol/L   Chloride 104 98 - 110 mmol/L   CO2 26 20 - 32 mmol/L   Calcium 9.9 8.6 - 10.4 mg/dL   Total Protein 7.7 6.1 - 8.1 g/dL   Albumin 4.3 3.6 - 5.1 g/dL   Globulin 3.4 1.9 - 3.7 g/dL (calc)   AG Ratio 1.3 1.0 - 2.5 (calc)   Total Bilirubin 0.4 0.2 - 1.2 mg/dL   Alkaline phosphatase (APISO) 62 37 - 153 U/L   AST 19 10 - 35 U/L   ALT 19 6 - 29 U/L  Hemoglobin A1c     Status: Abnormal   Collection Time: 02/03/21  9:16 AM  Result Value Ref Range   Hgb A1c MFr Bld 5.7 (H) <5.7 % of total Hgb    Comment: For someone without known diabetes, a hemoglobin  A1c value between 5.7% and 6.4% is consistent with prediabetes and should be confirmed with a  follow-up test. . For someone with known diabetes, a value <7% indicates that their diabetes is well controlled. A1c targets should be individualized based on duration of diabetes, age, comorbid conditions, and other considerations. . This assay result is consistent with an increased risk of diabetes. . Currently, no consensus exists regarding use of hemoglobin A1c for diagnosis of diabetes for children. .    Mean Plasma Glucose 117 mg/dL   eAG (mmol/L) 6.5 mmol/L  VITAMIN D 25 Hydroxy (Vit-D Deficiency, Fractures)     Status: Abnormal   Collection Time: 02/03/21  9:16 AM  Result Value Ref Range   Vit D, 25-Hydroxy 27 (L) 30 - 100 ng/mL  Comment: Vitamin D Status         25-OH Vitamin D: . Deficiency:                    <20 ng/mL Insufficiency:             20 - 29 ng/mL Optimal:                 > or = 30 ng/mL . For 25-OH Vitamin D testing on patients on   D2-supplementation and patients for whom quantitation  of D2 and D3 fractions is required, the QuestAssureD(TM) 25-OH VIT D, (D2,D3), LC/MS/MS is recommended: order  code [phone] (patients >33yr). See Note 1 . Note 1 . For additional information, please refer to  http://education.[facility].com/faq/FAQ199  (This link is being provided for informational/ educational purposes only.)   C-reactive protein     Status: None   Collection Time: 02/03/21  9:16 AM  Result Value Ref Range   CRP 0.9 <8.0 mg/L  Sedimentation rate     Status: Abnormal   Collection Time: 02/03/21  9:16 AM  Result Value Ref Range   Sed Rate 62 (H) 0 - 30 mm/h  Lipase, blood     Status: None   Collection Time: 03/29/21  9:21 AM  Result Value Ref Range   Lipase 35 11 - 51 U/L    Comment: [facility] [address], [place] [address] Comprehensive metabolic panel     Status: Abnormal   Collection Time: 03/29/21  9:21 AM  Result Value Ref Range   Sodium 136 135 - 145 mmol/L   Potassium 3.8 3.5 - 5.1 mmol/L   Chloride 100 98 - 111 mmol/L   CO2 25 22 - 32 mmol/L   Glucose, Bld 144 (H) 70 - 99 mg/dL    Comment: Glucose reference range applies only to samples taken after fasting for at least 8 hours.   BUN 16 8 - 23 mg/dL   Creatinine, Ser 0.90 0.44 - 1.00 mg/dL   Calcium 9.4 8.9 - 10.3 mg/dL   Total Protein 7.4 6.5 - 8.1 g/dL   Albumin 3.7 3.5 - 5.0 g/dL   AST 18 15 - 41 U/L   ALT 23 0 - 44 U/L   Alkaline Phosphatase 61 38 - 126 U/L   Total Bilirubin 1.1 0.3 - 1.2 mg/dL   GFR, Estimated >60 >60 mL/min    Comment: (NOTE) Calculated using the CKD-EPI Creatinine Equation (2021)    Anion gap 11 5 - 15    Comment: [facility]formed at [facility] [address], [place] [id number] CBC     Status: Abnormal   Collection Time: 03/29/21  9:21 AM  Result Value Ref Range   WBC 20.8 (H) 4.0 - 10.5 K/uL   RBC 4.48 3.87 - 5.11 MIL/uL   Hemoglobin 14.1 12.0 -  15.0 g/dL   HCT 40.8 36.0 - 46.0 %   MCV 91.1 80.0 - 100.0 fL   MCH 31.5 26.0 - 34.0 pg   MCHC 34.6 30.0 - 36.0 g/dL   RDW 13.2 11.5 - 15.5 %   Platelets 338 150 - 400 K/uL   nRBC 0.0 0.0 - 0.2 %    Comment: [facility]formed at [facility] [address] [place] [address] Type and screen [id number]    Status: None   Collection Time: 03/29/21  9:37 AM  Result Value Ref Range  ABO/RH(D) A POS    Antibody Screen NEG    Sample Expiration      04/01/2021,2359 Performed at [facility], [address] [place], [place] [address]      PHQ2/9: Depression screen [facility] 2/9 04/01/2021 02/03/2021 08/25/2020 07/15/2020 05/02/2020  Decreased Interest 0 0 0 0 0  Down, Depressed, Hopeless 0 0 0 0 0  PHQ - 2 Score 0 0 0 0 0  Altered sleeping - - - - -  Tired, decreased energy - - - - -  Change in appetite - - - - -  Feeling bad or failure about yourself  - - - - -  Trouble concentrating - - - - -  Moving slowly or fidgety/restless - - - - -  Suicidal thoughts - - - - -  PHQ-9 Score - - - - -  Difficult doing work/chores - - - - -  Some recent data might be hidden    phq 9 is negative   Fall Risk: Fall Risk  04/01/2021 02/03/2021 08/25/2020 07/15/2020 05/02/2020  Falls in the past year? 0 0 0 0 0  Number falls in past yr: 0 0 0 0 0  Injury with Fall? 0 0 0 0 0  Risk for fall due to : Impaired balance/gait Impaired balance/gait;Orthopedic patient - - -  Risk for fall due to: Comment - - - - -  Follow up Falls prevention discussed Falls prevention discussed - - -      Functional Status Survey: Is the patient deaf or have difficulty hearing?: No Does the patient have difficulty seeing, even when wearing glasses/contacts?: No Does the patient have difficulty concentrating, remembering, or making decisions?: No Does the patient have difficulty walking or climbing stairs?: Yes Does the patient have difficulty dressing or bathing?: No Does the  patient have difficulty doing errands alone such as visiting a doctor's office or shopping?: No    Assessment & Plan  1. Diverticulitis of sigmoid colon  - CBC with Differential/Platelet  2. Lesion of liver  - MR LIVER W WO CONTRAST; Future   4. Needs flu shot  - Flu vaccine HIGH DOSE PF (Fluzone High dose)

## 2021-04-02 ENCOUNTER — Ambulatory Visit: Payer: Medicare Other | Admitting: Unknown Physician Specialty

## 2021-04-02 ENCOUNTER — Ambulatory Visit (INDEPENDENT_AMBULATORY_CARE_PROVIDER_SITE_OTHER): Payer: Medicare Other

## 2021-04-02 DIAGNOSIS — K5732 Diverticulitis of large intestine without perforation or abscess without bleeding: Secondary | ICD-10-CM

## 2021-04-02 DIAGNOSIS — I10 Essential (primary) hypertension: Secondary | ICD-10-CM

## 2021-04-02 DIAGNOSIS — K769 Liver disease, unspecified: Secondary | ICD-10-CM

## 2021-04-02 NOTE — Patient Instructions (Addendum)
Thank you for allowing the Chronic Care Management team to participate in your care.    Patient Care Plan: Diverticulitis     Problem Identified: Diverticulitis      Goal: Symptom Management   Start Date: 04/02/2021  Expected End Date: 05/02/2021  Priority: High  Note:    Current Barriers:  Chronic Disease Management support and educational needs related to Diverticulitis and Liver Lesion in patient with HTN and A-Fib.  Case Manager Clinical Goal(s):  Over the next 30 days, patient will not require hospitalization due to complications related to diverticulitis.  Over the next 30 days, patient will complete diagnostic imaging.  Interventions:  Collaboration with Steele Sizer, MD regarding development and update of comprehensive plan of care as evidenced by provider attestation and co-signature Inter-disciplinary care team collaboration (see longitudinal plan of care) Reviewed medications. Advised to continue medications as prescribed. Advised to complete full course of antibiotics. Advised to notify provider with concerns regarding prescribed regimen.  Discussed nutritional intake and importance of complying with recommended diet. Advised to continue monitoring intake and comply with recommended diet to prevent a diverticulitis flare.  Advised to assess symptoms and contact the clinic if symptoms return. Thoroughly discussed worsening s/sx that require immediate medical attention.   Self Care/Patient Goals: Take medications as prescribed Adhere to recommended diet Complete diagnostic imaging  Contact the clinic for questions and new concerns as needed   Follow Up Plan:  Will follow up within the next month       [name] verbalized understanding of the information discussed during the telephonic outreach. Declined need for  mailed/printed instructions. A member of the care management team will follow up within the next month.   [name] [facility] Care  Management [facility] [phone]

## 2021-04-02 NOTE — Chronic Care Management (AMB) (Signed)
Chronic Care Management   CCM RN Visit Note  04/02/2021 Name: [name] MRN: [id number] DOB: 04-09-1941  Subjective: [name] is a 80 y.o. year old female who is a primary care patient of Steele Sizer, MD. The care management team was consulted for assistance with disease management and care coordination needs.    Engaged with patient by telephone for follow up visit in response to provider referral for case management and care coordination services.   Consent to Services:  The patient was given information about Chronic Care Management services, agreed to services, and gave verbal consent prior to initiation of services.  Please see initial visit note for detailed documentation.   Patient agreed to services and verbal consent obtained.   Assessment: Review of patient past medical history, allergies, medications, health status, including review of consultants reports, laboratory and other test data, was performed as part of comprehensive evaluation and provision of chronic care management services.   SDOH (Social Determinants of Health) assessments and interventions performed:    CCM Care Plan  Allergies  Allergen Reactions   Ace Inhibitors Swelling    Patient states her tongue swell.   Penicillins Anxiety    Has patient had a PCN reaction causing immediate rash, facial/tongue/throat swelling, SOB or lightheadedness with hypotension: No Has patient had a PCN reaction causing severe rash involving mucus membranes or skin necrosis: No Has patient had a PCN reaction that required hospitalization No Has patient had a PCN reaction occurring within the last 10 years: No If all of the above answers are "NO", then may proceed with Cephalosporin use.    Outpatient Encounter Medications as of 04/02/2021  Medication Sig   albuterol (VENTOLIN HFA) 108 (90 Base) MCG/ACT inhaler INHALE 2 PUFFS INTO THE LUNGS EVERY 6 HOURS AS NEEDED FOR WHEEZING OR SHORTNESS OF BREATH    amLODipine (NORVASC) 5 MG tablet TAKE 1 TABLET(5 MG) BY MOUTH IN THE MORNING AND AT BEDTIME. HOLD EVENING DOSE IF BLOOD PRESSURE BELOW 115/75   amLODipine (NORVASC) 5 MG tablet Take by mouth.   amoxicillin-clavulanate (AUGMENTIN) 875-125 MG tablet Take 1 tablet by mouth 2 (two) times daily for 7 days.   apixaban (ELIQUIS) 5 MG TABS tablet TAKE 1 TABLET(5 MG) BY MOUTH TWICE DAILY   diltiazem (CARDIZEM) 30 MG tablet Take 1 tablet (30 mg total) by mouth every 6 (six) hours as needed (for tachycardia/recurrent Afib.).   meclizine (ANTIVERT) 25 MG tablet Take 25 mg by mouth 3 (three) times daily as needed (vertigo/dizziness).   rosuvastatin (CRESTOR) 10 MG tablet Take 1 tablet (10 mg total) by mouth at bedtime.   No facility-administered encounter medications on file as of 04/02/2021.    Patient Active Problem List   Diagnosis Date Noted   Osteoarthritis of right knee 05/05/2020   History of colonic polyps    Polyp of transverse colon    Aneurysm ([place]) 02/08/2018   History of CVA (cerebrovascular accident) without residual deficits 01/24/2018   Aneurysm of ophthalmic artery 01/24/2018   TIA (transient ischemic attack) 01/22/2018   Impingement syndrome of shoulder region 01/02/2018   History of cataract surgery, right 09/01/2016   Paroxysmal atrial fibrillation ([place]) 02/24/2016   Vertigo 02/24/2016   Low HDL (under 40) 02/17/2016   Dizziness 02/15/2016   Hx of colonic polyps    Benign neoplasm of sigmoid colon    Asthma, mild intermittent 02/21/2015   Hyperglycemia 02/21/2015   Essential hypertension 02/20/2015   Inconclusive mammogram due to dense breasts 01/02/2015  Dense breast 01/02/2015   Abnormal EKG 02/23/2013    Conditions to be addressed/monitored:Diverticulitis Patient Care Plan: Diverticulitis     Problem Identified: Diverticulitis      Goal: Symptom Management   Start Date: 04/02/2021  Expected End Date: 05/02/2021  Priority: High  Note:    Current Barriers:   Chronic Disease Management support and educational needs related to Diverticulitis and Liver Lesion in patient with HTN and A-Fib.  Case Manager Clinical Goal(s):  Over the next 30 days, patient will not require hospitalization due to complications related to diverticulitis.  Over the next 30 days, patient will complete diagnostic imaging.  Interventions:  Collaboration with Steele Sizer, MD regarding development and update of comprehensive plan of care as evidenced by provider attestation and co-signature Inter-disciplinary care team collaboration (see longitudinal plan of care) Reviewed medications. Advised to continue medications as prescribed. Advised to complete full course of antibiotics. Advised to notify provider with concerns regarding prescribed regimen.  Discussed nutritional intake and importance of complying with recommended diet. Advised to continue monitoring intake and comply with recommended diet to prevent a diverticulitis flare.  Advised to assess symptoms and contact the clinic if symptoms return. Thoroughly discussed worsening s/sx that require immediate medical attention.   Self Care/Patient Goals: Take medications as prescribed Adhere to recommended diet Complete diagnostic imaging  Contact the clinic for questions and new concerns as needed   Follow Up Plan:  Will follow up within the next month      PLAN A member of the care management team will follow up within the next month.  [name] [facility] Care Management [facility] [phone]

## 2021-04-03 ENCOUNTER — Ambulatory Visit: Payer: Self-pay

## 2021-04-03 DIAGNOSIS — I10 Essential (primary) hypertension: Secondary | ICD-10-CM

## 2021-04-03 DIAGNOSIS — K769 Liver disease, unspecified: Secondary | ICD-10-CM

## 2021-04-03 NOTE — Chronic Care Management (AMB) (Signed)
Chronic Care Management   CCM RN Visit Note  04/03/2021 Name: [name] MRN: [id number] DOB: [birth year]  Subjective: [name] is a 80 y.o. year old female who is a primary care patient of Steele Sizer, MD. The care management team was consulted for assistance with disease management and care coordination needs.    Engaged with patient by telephone for follow up visit in response to provider referral for case management and care coordination services.   Consent to Services:  The patient was given information about Chronic Care Management services, agreed to services, and gave verbal consent prior to initiation of services.  Please see initial visit note for detailed documentation.    Assessment: Review of patient past medical history, allergies, medications, health status, including review of consultants reports, laboratory and other test data, was performed as part of comprehensive evaluation and provision of chronic care management services.   SDOH (Social Determinants of Health) assessments and interventions performed:  No  CCM Care Plan  Allergies  Allergen Reactions   Ace Inhibitors Swelling    Patient states her tongue swell.   Penicillins Anxiety    Has patient had a PCN reaction causing immediate rash, facial/tongue/throat swelling, SOB or lightheadedness with hypotension: No Has patient had a PCN reaction causing severe rash involving mucus membranes or skin necrosis: No Has patient had a PCN reaction that required hospitalization No Has patient had a PCN reaction occurring within the last 10 years: No If all of the above answers are "NO", then may proceed with Cephalosporin use.    Outpatient Encounter Medications as of 04/03/2021  Medication Sig   albuterol (VENTOLIN HFA) 108 (90 Base) MCG/ACT inhaler INHALE 2 PUFFS INTO THE LUNGS EVERY 6 HOURS AS NEEDED FOR WHEEZING OR SHORTNESS OF BREATH   amLODipine (NORVASC) 5 MG tablet TAKE 1 TABLET(5 MG) BY MOUTH  IN THE MORNING AND AT BEDTIME. HOLD EVENING DOSE IF BLOOD PRESSURE BELOW 115/75   amLODipine (NORVASC) 5 MG tablet Take by mouth.   amoxicillin-clavulanate (AUGMENTIN) 875-125 MG tablet Take 1 tablet by mouth 2 (two) times daily for 7 days.   apixaban (ELIQUIS) 5 MG TABS tablet TAKE 1 TABLET(5 MG) BY MOUTH TWICE DAILY   diltiazem (CARDIZEM) 30 MG tablet Take 1 tablet (30 mg total) by mouth every 6 (six) hours as needed (for tachycardia/recurrent Afib.).   meclizine (ANTIVERT) 25 MG tablet Take 25 mg by mouth 3 (three) times daily as needed (vertigo/dizziness).   rosuvastatin (CRESTOR) 10 MG tablet Take 1 tablet (10 mg total) by mouth at bedtime.   No facility-administered encounter medications on file as of 04/03/2021.    Patient Active Problem List   Diagnosis Date Noted   Osteoarthritis of right knee 05/05/2020   History of colonic polyps    Polyp of transverse colon    Aneurysm ([place]) 02/08/2018   History of CVA (cerebrovascular accident) without residual deficits 01/24/2018   Aneurysm of ophthalmic artery 01/24/2018   TIA (transient ischemic attack) 01/22/2018   Impingement syndrome of shoulder region 01/02/2018   History of cataract surgery, right 09/01/2016   Paroxysmal atrial fibrillation ([place]) 02/24/2016   Vertigo 02/24/2016   Low HDL (under 40) 02/17/2016   Dizziness 02/15/2016   Hx of colonic polyps    Benign neoplasm of sigmoid colon    Asthma, mild intermittent 02/21/2015   Hyperglycemia 02/21/2015   Essential hypertension 02/20/2015   Inconclusive mammogram due to dense breasts 01/02/2015   Dense breast 01/02/2015   Abnormal EKG 02/23/2013  Conditions to be addressed/monitored. Diverticulitis and Diagnostic Imaging Patient Care Plan: Diverticulitis     Problem Identified: Diverticulitis      Goal: Symptom Management   Start Date: 04/02/2021  Expected End Date: 05/02/2021  Priority: High  Note:    Current Barriers:  Chronic Disease Management support and  educational needs related to Diverticulitis and Liver Lesion in patient with HTN and A-Fib.  Case Manager Clinical Goal(s):  Over the next 30 days, patient will not require hospitalization due to complications related to diverticulitis.  Over the next 30 days, patient will complete diagnostic imaging.  Interventions:  Collaboration with Steele Sizer, MD regarding development and update of comprehensive plan of care as evidenced by provider attestation and co-signature Inter-disciplinary care team collaboration (see longitudinal plan of care) Reviewed medications. Advised to continue medications as prescribed. Advised to complete full course of antibiotics. Advised to notify provider with concerns regarding prescribed regimen.  Discussed nutritional intake and importance of complying with recommended diet. Advised to continue monitoring intake and comply with recommended diet to prevent a diverticulitis flare.  Advised to assess symptoms and contact the clinic if symptoms return. Thoroughly discussed worsening s/sx that require immediate medical attention. Update on 04/03/21: Patient requesting assistance with verifying location of diagnostic test. Expressed concerns regarding exact reason for imaging. Reviewed referral and informed patient of current referral for [facility] r/t liver lesion. Patient advised that she will be contacted to schedule imaging. Agreed to call if additional assistance is required.   Self Care/Patient Goals: Take medications as prescribed Adhere to recommended diet Complete diagnostic imaging  Contact the clinic for questions and new concerns as needed   Follow Up Plan:  Will follow up within the next month       PLAN A member of the care management team will follow up next week.   [name] [facility] Care Management [facility] [phone]

## 2021-04-06 ENCOUNTER — Telehealth: Payer: Self-pay | Admitting: Family Medicine

## 2021-04-06 ENCOUNTER — Ambulatory Visit: Payer: Self-pay

## 2021-04-06 DIAGNOSIS — I10 Essential (primary) hypertension: Secondary | ICD-10-CM

## 2021-04-06 DIAGNOSIS — K769 Liver disease, unspecified: Secondary | ICD-10-CM

## 2021-04-06 NOTE — Patient Instructions (Addendum)
Thank you for allowing the Chronic Care Management team to participate in your care.    Patient Care Plan: Diverticulitis     Problem Identified: Diverticulitis      Goal: Symptom Management   Start Date: 04/02/2021  Expected End Date: 05/02/2021  Priority: High  Note:    Current Barriers:  Chronic Disease Management support and educational needs related to Diverticulitis and Liver Lesion in patient with HTN and A-Fib.  Case Manager Clinical Goal(s):  Over the next 30 days, patient will not require hospitalization due to complications related to diverticulitis.  Over the next 30 days, patient will complete diagnostic imaging.  Interventions:  Collaboration with Steele Sizer, MD regarding development and update of comprehensive plan of care as evidenced by provider attestation and co-signature Inter-disciplinary care team collaboration (see longitudinal plan of care) Reviewed medications. Advised to continue medications as prescribed. Advised to complete full course of antibiotics. Advised to notify provider with concerns regarding prescribed regimen.  Discussed nutritional intake and importance of complying with recommended diet. Advised to continue monitoring intake and comply with recommended diet to prevent a diverticulitis flare.  Advised to assess symptoms and contact the clinic if symptoms return. Thoroughly discussed worsening s/sx that require immediate medical attention. Update on 04/06/21: Patient called to confirm message regarding an appointment. She is pending diagnostic imaging r/t liver lesion. Contacted [facility]. Imaging scheduled for October 19th. Reviewed instructions. Informed of need to be NPO 4 hours prior to imaging. Patient aware of check-in location via the [place] entry. Agreed to call if additional assistance is required.     Self Care/Patient Goals: Take medications as prescribed Adhere to recommended diet Complete diagnostic  imaging as scheduled on 04/15/21 Contact the clinic for questions and new concerns as needed   Follow Up Plan:  Will follow up within the next month        [name] verbalized understanding of the information discussed during the telephonic outreach. Declined need for mailed/printed instructions. A member of the care management team will follow up within the next month.   [name] Care Management [facility] [phone]

## 2021-04-06 NOTE — Telephone Encounter (Signed)
Thank you. She is pending diagnostic imaging  r/t a liver lesion. We are trying to determine who contacted her. I will call her back after I speak with the team at [place].

## 2021-04-06 NOTE — Chronic Care Management (AMB) (Signed)
Chronic Care Management   CCM RN Visit Note  04/06/2021 Name: [name] MRN: [id number] DOB: 03/27/1941  Subjective: [name] is a 80 y.o. year old female who is a primary care patient of Steele Sizer, MD. The care management team was consulted for assistance with disease management and care coordination needs.    Engaged with patient by telephone for follow up visit in response to provider referral for case management and care coordination services.   Consent to Services:  The patient was given information about Chronic Care Management services, agreed to services, and gave verbal consent prior to initiation of services.  Please see initial visit note for detailed documentation.    Assessment: Review of patient past medical history, allergies, medications, health status, including review of consultants reports, laboratory and other test data, was performed as part of comprehensive evaluation and provision of chronic care management services.   SDOH (Social Determinants of Health) assessments and interventions performed: No  CCM Care Plan  Allergies  Allergen Reactions   Ace Inhibitors Swelling    Patient states her tongue swell.   Penicillins Anxiety    Has patient had a PCN reaction causing immediate rash, facial/tongue/throat swelling, SOB or lightheadedness with hypotension: No Has patient had a PCN reaction causing severe rash involving mucus membranes or skin necrosis: No Has patient had a PCN reaction that required hospitalization No Has patient had a PCN reaction occurring within the last 10 years: No If all of the above answers are "NO", then may proceed with Cephalosporin use.    Outpatient Encounter Medications as of 04/06/2021  Medication Sig   albuterol (VENTOLIN HFA) 108 (90 Base) MCG/ACT inhaler INHALE 2 PUFFS INTO THE LUNGS EVERY 6 HOURS AS NEEDED FOR WHEEZING OR SHORTNESS OF BREATH   amLODipine (NORVASC) 5 MG tablet TAKE 1 TABLET(5 MG) BY  MOUTH IN THE MORNING AND AT BEDTIME. HOLD EVENING DOSE IF BLOOD PRESSURE BELOW 115/75   amLODipine (NORVASC) 5 MG tablet Take by mouth.   apixaban (ELIQUIS) 5 MG TABS tablet TAKE 1 TABLET(5 MG) BY MOUTH TWICE DAILY   diltiazem (CARDIZEM) 30 MG tablet Take 1 tablet (30 mg total) by mouth every 6 (six) hours as needed (for tachycardia/recurrent Afib.).   meclizine (ANTIVERT) 25 MG tablet Take 25 mg by mouth 3 (three) times daily as needed (vertigo/dizziness).   rosuvastatin (CRESTOR) 10 MG tablet Take 1 tablet (10 mg total) by mouth at bedtime.   No facility-administered encounter medications on file as of 04/06/2021.    Patient Active Problem List   Diagnosis Date Noted   Osteoarthritis of right knee 05/05/2020   History of colonic polyps    Polyp of transverse colon    Aneurysm ([place]) 02/08/2018   History of CVA (cerebrovascular accident) without residual deficits 01/24/2018   Aneurysm of ophthalmic artery 01/24/2018   TIA (transient ischemic attack) 01/22/2018   Impingement syndrome of shoulder region 01/02/2018   History of cataract surgery, right 09/01/2016   Paroxysmal atrial fibrillation ([place]) 02/24/2016   Vertigo 02/24/2016   Low HDL (under 40) 02/17/2016   Dizziness 02/15/2016   Hx of colonic polyps    Benign neoplasm of sigmoid colon    Asthma, mild intermittent 02/21/2015   Hyperglycemia 02/21/2015   Essential hypertension 02/20/2015   Inconclusive mammogram due to dense breasts 01/02/2015   Dense breast 01/02/2015   Abnormal EKG 02/23/2013    Conditions to be addressed/monitored: Diagnostic Imaging/Liver Lesion Patient Care Plan: Diverticulitis     Problem Identified:  Diverticulitis      Goal: Symptom Management   Start Date: 04/02/2021  Expected End Date: 05/02/2021  Priority: High  Note:    Current Barriers:  Chronic Disease Management support and educational needs related to Diverticulitis and Liver Lesion in patient with HTN and A-Fib.  Case Manager  Clinical Goal(s):  Over the next 30 days, patient will not require hospitalization due to complications related to diverticulitis.  Over the next 30 days, patient will complete diagnostic imaging.  Interventions:  Collaboration with Steele Sizer, MD regarding development and update of comprehensive plan of care as evidenced by provider attestation and co-signature Inter-disciplinary care team collaboration (see longitudinal plan of care) Reviewed medications. Advised to continue medications as prescribed. Advised to complete full course of antibiotics. Advised to notify provider with concerns regarding prescribed regimen.  Discussed nutritional intake and importance of complying with recommended diet. Advised to continue monitoring intake and comply with recommended diet to prevent a diverticulitis flare.  Advised to assess symptoms and contact the clinic if symptoms return. Thoroughly discussed worsening s/sx that require immediate medical attention. Update on 04/06/21: Patient called to confirm message regarding an appointment. She is pending diagnostic imaging r/t liver lesion. Contacted [facility]. Imaging scheduled for October 19th. Reviewed instructions. Informed of need to be NPO 4 hours prior to imaging. Patient aware of check-in location via the [place] entry. Agreed to call if additional assistance is required.     Self Care/Patient Goals: Take medications as prescribed Adhere to recommended diet Complete diagnostic imaging as scheduled on 04/15/21 Contact the clinic for questions and new concerns as needed   Follow Up Plan:  Will follow up within the next month       PLAN A member of the care management team will follow up within the next month.   [name] [facility] Care Management [facility] [phone]

## 2021-04-06 NOTE — Telephone Encounter (Signed)
Pt called and stated she received a message that she has an appt on Mychart / she called to find out when  this appt was and stated she can not get on mychart / Pt has been trying to reach [place] also mentioned no one has called back about scheduling for additional testing that was wanted by Dr. Ancil Boozer / please advise /

## 2021-04-07 NOTE — Patient Instructions (Signed)
  Patient Care Plan: Diverticulitis     Problem Identified: Diverticulitis      Goal: Symptom Management   Start Date: 04/02/2021  Expected End Date: 05/02/2021  Priority: High  Note:    Current Barriers:  Chronic Disease Management support and educational needs related to Diverticulitis and Liver Lesion in patient with HTN and A-Fib.  Case Manager Clinical Goal(s):  Over the next 30 days, patient will not require hospitalization due to complications related to diverticulitis.  Over the next 30 days, patient will complete diagnostic imaging.  Interventions:  Collaboration with Steele Sizer, MD regarding development and update of comprehensive plan of care as evidenced by provider attestation and co-signature Inter-disciplinary care team collaboration (see longitudinal plan of care) Reviewed medications. Advised to continue medications as prescribed. Advised to complete full course of antibiotics. Advised to notify provider with concerns regarding prescribed regimen.  Discussed nutritional intake and importance of complying with recommended diet. Advised to continue monitoring intake and comply with recommended diet to prevent a diverticulitis flare.  Advised to assess symptoms and contact the clinic if symptoms return. Thoroughly discussed worsening s/sx that require immediate medical attention. Update on 04/03/21: Patient requesting assistance with verifying location of diagnostic test. Expressed concerns regarding exact reason for imaging. Reviewed referral and informed patient of current referral for [facility] r/t liver lesion. Patient advised that she will be contacted to schedule imaging. Agreed to call if additional assistance is required.   Self Care/Patient Goals: Take medications as prescribed Adhere to recommended diet Complete diagnostic imaging  Contact the clinic for questions and new concerns as needed   Follow Up Plan:  Will follow up within the  next month        Care Coordination regarding diagnostic imaging. Ms. Sundeen declined need for mailed/printed instructions. A member of the care management team will follow up next week.   [name] Care Management [facility] [phone]

## 2021-04-08 ENCOUNTER — Other Ambulatory Visit: Payer: Self-pay

## 2021-04-08 ENCOUNTER — Ambulatory Visit: Payer: Medicare Other | Admitting: Podiatry

## 2021-04-08 DIAGNOSIS — G5761 Lesion of plantar nerve, right lower limb: Secondary | ICD-10-CM

## 2021-04-08 NOTE — Progress Notes (Signed)
  Subjective:  [name], female    DOB: 08/25/1940,  MRN: [id number]  Chief Complaint  Patient presents with   Toe Pain    New pt-had knee surgery now having issue with toe-Right foot 2nd toe    80 y.o. female presents with the above complaint. History confirmed with patient.  Describes it as numbing tingling and burning pain that comes and goes mostly on the second toe but a little bit into the third  Objective:  Physical Exam: warm, good capillary refill, no trophic changes or ulcerative lesions, normal DP and PT pulses, and normal sensory exam.  Right Foot: Pain on palpation to interspace at digital sulcus which radiates into the toes the second interspace between second and third toes   Assessment:   1. 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  [place] Neuroma -Educated on etiology -Educated on padding and proper shoegear -Injection delivered to the affected interspaces with 10 mg of Kenalog and 4 mg of dexamethasone and 1 cc of lidocaine 2% plain -Metatarsal pads dispensed and she will try these  Return if symptoms worsen or fail to improve.

## 2021-04-13 ENCOUNTER — Ambulatory Visit: Payer: Self-pay

## 2021-04-13 DIAGNOSIS — K769 Liver disease, unspecified: Secondary | ICD-10-CM

## 2021-04-13 DIAGNOSIS — E782 Mixed hyperlipidemia: Secondary | ICD-10-CM

## 2021-04-13 NOTE — Chronic Care Management (AMB) (Signed)
Chronic Care Management   CCM RN Visit Note  04/13/2021 Name: [name] MRN: [id number] DOB: Nov 14, 1940  Subjective: [name] is a 80 y.o. year old female who is a primary care patient of Steele Sizer, MD. The care management team was consulted for assistance with disease management and care coordination needs.    Engaged with patient by telephone for follow up visit in response to provider referral for case management and care coordination services.   Consent to Services:  The patient was given information about Chronic Care Management services, agreed to services, and gave verbal consent prior to initiation of services.  Please see initial visit note for detailed documentation.   Assessment: Review of patient past medical history, allergies, medications, health status, including review of consultants reports, laboratory and other test data, was performed as part of comprehensive evaluation and provision of chronic care management services.   SDOH (Social Determinants of Health) assessments and interventions performed:  No  CCM Care Plan  Allergies  Allergen Reactions   Ace Inhibitors Swelling    Patient states her tongue swell.   Penicillins Anxiety    Has patient had a PCN reaction causing immediate rash, facial/tongue/throat swelling, SOB or lightheadedness with hypotension: No Has patient had a PCN reaction causing severe rash involving mucus membranes or skin necrosis: No Has patient had a PCN reaction that required hospitalization No Has patient had a PCN reaction occurring within the last 10 years: No If all of the above answers are "NO", then may proceed with Cephalosporin use.    Outpatient Encounter Medications as of 04/13/2021  Medication Sig   albuterol (VENTOLIN HFA) 108 (90 Base) MCG/ACT inhaler INHALE 2 PUFFS INTO THE LUNGS EVERY 6 HOURS AS NEEDED FOR WHEEZING OR SHORTNESS OF BREATH   amLODipine (NORVASC) 5 MG tablet TAKE 1 TABLET(5 MG) BY MOUTH  IN THE MORNING AND AT BEDTIME. HOLD EVENING DOSE IF BLOOD PRESSURE BELOW 115/75   amLODipine (NORVASC) 5 MG tablet Take by mouth.   apixaban (ELIQUIS) 5 MG TABS tablet TAKE 1 TABLET(5 MG) BY MOUTH TWICE DAILY   diltiazem (CARDIZEM) 30 MG tablet Take 1 tablet (30 mg total) by mouth every 6 (six) hours as needed (for tachycardia/recurrent Afib.).   meclizine (ANTIVERT) 25 MG tablet Take 25 mg by mouth 3 (three) times daily as needed (vertigo/dizziness).   rosuvastatin (CRESTOR) 10 MG tablet Take 1 tablet (10 mg total) by mouth at bedtime.   No facility-administered encounter medications on file as of 04/13/2021.    Patient Active Problem List   Diagnosis Date Noted   Osteoarthritis of right knee 05/05/2020   History of colonic polyps    Polyp of transverse colon    Aneurysm ([place]) 02/08/2018   History of CVA (cerebrovascular accident) without residual deficits 01/24/2018   Aneurysm of ophthalmic artery 01/24/2018   TIA (transient ischemic attack) 01/22/2018   Impingement syndrome of shoulder region 01/02/2018   History of cataract surgery, right 09/01/2016   Paroxysmal atrial fibrillation ([place]) 02/24/2016   Vertigo 02/24/2016   Low HDL (under 40) 02/17/2016   Dizziness 02/15/2016   Hx of colonic polyps    Benign neoplasm of sigmoid colon    Asthma, mild intermittent 02/21/2015   Hyperglycemia 02/21/2015   Essential hypertension 02/20/2015   Inconclusive mammogram due to dense breasts 01/02/2015   Dense breast 01/02/2015   Abnormal EKG 02/23/2013    Conditions to be addressed/monitored: Diverticulitis Patient Care Plan: Diverticulitis     Problem Identified: Diverticulitis  Goal: Symptom Management   Start Date: 04/02/2021  Expected End Date: 05/02/2021  Priority: High  Note:    Current Barriers:  Chronic Disease Management support and educational needs related to Diverticulitis and Liver Lesion in patient with HTN, HLD and A-Fib.  Case Manager Clinical Goal(s):   Over the next 30 days, patient will not require hospitalization due to complications related to diverticulitis.  Over the next 30 days, patient will complete diagnostic imaging.  Interventions:  Collaboration with Steele Sizer, MD regarding development and update of comprehensive plan of care as evidenced by provider attestation and co-signature Inter-disciplinary care team collaboration (see longitudinal plan of care) Reviewed medications. Advised to continue medications as prescribed. Advised to complete full course of antibiotics. Advised to notify provider with concerns regarding prescribed regimen.  Discussed nutritional intake and importance of complying with recommended diet. Advised to continue monitoring intake and comply with recommended diet to prevent a diverticulitis flare.  Advised to assess symptoms and contact the clinic if symptoms return. Thoroughly discussed worsening s/sx that require immediate medical attention. Update on 04/13/21: Message received regarding medications. Patient reports taking tylenol at regular intervals since 04/11/21 due to chronic left knee pain. Outpatient Imaging staff requested confirmation that she can continue taking tylenol d/t pending imaging r/t liver lesion. Collaborated with Dr. Ancil Boozer. Advised that patient can continue taking as needed. Last liver enzymes were within normal range. Reviewed pre-imaging instructions with patient. She agreed to call with questions/concerns if needed.      Self Care/Patient Goals: Take medications as prescribed Adhere to recommended diet Complete diagnostic imaging as scheduled on 04/15/21 Contact the clinic for questions and new concerns as needed   Follow Up Plan:  Will follow up within the next month        PLAN A member of the care management team will follow up within the next month.   [name] [facility] Care Management [facility] [phone]

## 2021-04-15 ENCOUNTER — Other Ambulatory Visit: Payer: Self-pay | Admitting: Orthopedic Surgery

## 2021-04-15 ENCOUNTER — Other Ambulatory Visit: Payer: Self-pay

## 2021-04-15 ENCOUNTER — Ambulatory Visit
Admission: RE | Admit: 2021-04-15 | Discharge: 2021-04-15 | Disposition: A | Discharge status: Home / Self Care | Payer: Medicare Other | Source: Ambulatory Visit | Attending: Family Medicine | Admitting: Family Medicine

## 2021-04-15 DIAGNOSIS — K769 Liver disease, unspecified: Secondary | ICD-10-CM | POA: Diagnosis present

## 2021-04-15 DIAGNOSIS — M2392 Unspecified internal derangement of left knee: Secondary | ICD-10-CM

## 2021-04-15 MED ORDER — GADOBENATE DIMEGLUMINE 529 MG/ML IV SOLN
7.0000 mL | Freq: Once | INTRAVENOUS | Status: AC | PRN
  Administered 2021-04-15: 7 mL via INTRAVENOUS

## 2021-04-17 DIAGNOSIS — M1712 Unilateral primary osteoarthritis, left knee: Secondary | ICD-10-CM | POA: Diagnosis not present

## 2021-04-17 NOTE — Patient Instructions (Signed)
  Patient Care Plan: Diverticulitis     Problem Identified: Diverticulitis      Goal: Symptom Management   Start Date: 04/02/2021  Expected End Date: 05/02/2021  Priority: High  Note:    Current Barriers:  Chronic Disease Management support and educational needs related to Diverticulitis and Liver Lesion in patient with HTN, HLD and A-Fib.  Case Manager Clinical Goal(s):  Over the next 30 days, patient will not require hospitalization due to complications related to diverticulitis.  Over the next 30 days, patient will complete diagnostic imaging.  Interventions:  Collaboration with Steele Sizer, MD regarding development and update of comprehensive plan of care as evidenced by provider attestation and co-signature Inter-disciplinary care team collaboration (see longitudinal plan of care) Reviewed medications. Advised to continue medications as prescribed. Advised to complete full course of antibiotics. Advised to notify provider with concerns regarding prescribed regimen.  Discussed nutritional intake and importance of complying with recommended diet. Advised to continue monitoring intake and comply with recommended diet to prevent a diverticulitis flare.  Advised to assess symptoms and contact the clinic if symptoms return. Thoroughly discussed worsening s/sx that require immediate medical attention. Update on 04/13/21: Message received regarding medications. Patient reports taking tylenol at regular intervals since 04/11/21 due to chronic left knee pain. Outpatient Imaging staff requested confirmation that she can continue taking tylenol d/t pending imaging r/t liver lesion. Collaborated with Dr. Ancil Boozer. Advised that patient can continue taking as needed. Last liver enzymes were within normal range. Reviewed pre-imaging instructions with patient. She agreed to call with questions/concerns if needed.      Self Care/Patient Goals: Take medications as prescribed Adhere to recommended  diet Complete diagnostic imaging as scheduled on 04/15/21 Contact the clinic for questions and new concerns as needed   Follow Up Plan:  Will follow up within the next month        Ms. Dippolito verbalized understanding of the information discussed during the telephonic outreach. Declined need for mailed/printed instructions. Agreed to call with questions if needed prior to diagnostic imaging. A member of the care management team will follow up within the next month.   [name] Care [facility] [phone]

## 2021-04-20 ENCOUNTER — Telehealth: Payer: Self-pay

## 2021-04-20 NOTE — Progress Notes (Signed)
    Chronic Care Management Pharmacy Assistant   Name: [name]  MRN: [id number] DOB: 26-May-1941  Patient missed her September appointment, and Junius Argyle, CPP sent me a task requesting me to reschedule the patient's missed appointment with CPP.  I spoke with the patient, and she reports that she has been having a lot of issues with her knees. Patient stated she has been having a lot of appointments lately, and is hardly unavailable. I informed the patient that we can un-enroll her from the program for now especially since she denies any issues with her medications. Patient stated she would like to just stay in the program but she can not guarantee she will be home to take the phone call. I informed her that I would schedule her for a telephone visit with Cristie Hem on 04/29/2021 @ 0830, and I will give her a call the day prior to confirm and if we have to reschedule the  appointment we can do so at that time. Patient is agreeable.   Medications: Outpatient Encounter Medications as of 04/20/2021  Medication Sig   albuterol (VENTOLIN HFA) 108 (90 Base) MCG/ACT inhaler INHALE 2 PUFFS INTO THE LUNGS EVERY 6 HOURS AS NEEDED FOR WHEEZING OR SHORTNESS OF BREATH   amLODipine (NORVASC) 5 MG tablet TAKE 1 TABLET(5 MG) BY MOUTH IN THE MORNING AND AT BEDTIME. HOLD EVENING DOSE IF BLOOD PRESSURE BELOW 115/75   amLODipine (NORVASC) 5 MG tablet Take by mouth.   apixaban (ELIQUIS) 5 MG TABS tablet TAKE 1 TABLET(5 MG) BY MOUTH TWICE DAILY   diltiazem (CARDIZEM) 30 MG tablet Take 1 tablet (30 mg total) by mouth every 6 (six) hours as needed (for tachycardia/recurrent Afib.).   meclizine (ANTIVERT) 25 MG tablet Take 25 mg by mouth 3 (three) times daily as needed (vertigo/dizziness).   rosuvastatin (CRESTOR) 10 MG tablet Take 1 tablet (10 mg total) by mouth at bedtime.   No facility-administered encounter medications on file as of 04/20/2021.    Lynann Bologna, CPA/CMA Clinical Pharmacist Assistant Phone:  [phone]

## 2021-04-23 ENCOUNTER — Other Ambulatory Visit: Payer: Self-pay

## 2021-04-23 ENCOUNTER — Ambulatory Visit
Admission: RE | Admit: 2021-04-23 | Discharge: 2021-04-23 | Disposition: A | Discharge status: Home / Self Care | Payer: Medicare Other | Source: Ambulatory Visit | Attending: Orthopedic Surgery | Admitting: Orthopedic Surgery

## 2021-04-23 DIAGNOSIS — M1712 Unilateral primary osteoarthritis, left knee: Secondary | ICD-10-CM | POA: Diagnosis not present

## 2021-04-23 DIAGNOSIS — M2392 Unspecified internal derangement of left knee: Secondary | ICD-10-CM | POA: Diagnosis present

## 2021-04-23 DIAGNOSIS — M25462 Effusion, left knee: Secondary | ICD-10-CM | POA: Diagnosis not present

## 2021-04-23 DIAGNOSIS — M23222 Derangement of posterior horn of medial meniscus due to old tear or injury, left knee: Secondary | ICD-10-CM | POA: Diagnosis not present

## 2021-04-27 DIAGNOSIS — I10 Essential (primary) hypertension: Secondary | ICD-10-CM

## 2021-04-27 DIAGNOSIS — E782 Mixed hyperlipidemia: Secondary | ICD-10-CM

## 2021-04-28 ENCOUNTER — Telehealth: Payer: Self-pay

## 2021-04-28 NOTE — Progress Notes (Signed)
    Chronic Care Management Pharmacy Assistant   Name: [name]  MRN: [id number] DOB: Jul 31, 1940  Patient called to reminded of her telephone appointment with Junius Argyle, CPP on 11/02 @ 0830  Patient aware of appointment date, time, and type of appointment (either telephone or in person). Patient aware to have/bring all medications, supplements, blood pressure and/or blood sugar logs to visit.  Questions: Are there any concerns you would like to discuss during your office visit? Nothing she can think of at this time  Are you having any problems obtaining your medications? None  Star Rating Drug: Rosuvastatin 10 mg last filled on 04/23/2021 for a 90-Day supply with [facility]  Any gaps in medications fill history? No  Care Gaps: Zoster Vaccines COVID-19 Vaccine Booster [place], CPA/CMA Clinical Pharmacist Assistant Phone: [phone]

## 2021-04-29 ENCOUNTER — Ambulatory Visit (INDEPENDENT_AMBULATORY_CARE_PROVIDER_SITE_OTHER): Payer: Medicare Other

## 2021-04-29 DIAGNOSIS — I480 Paroxysmal atrial fibrillation: Secondary | ICD-10-CM

## 2021-04-29 DIAGNOSIS — E782 Mixed hyperlipidemia: Secondary | ICD-10-CM

## 2021-04-29 DIAGNOSIS — I10 Essential (primary) hypertension: Secondary | ICD-10-CM

## 2021-04-29 DIAGNOSIS — M1711 Unilateral primary osteoarthritis, right knee: Secondary | ICD-10-CM

## 2021-04-29 DIAGNOSIS — J4520 Mild intermittent asthma, uncomplicated: Secondary | ICD-10-CM

## 2021-04-29 NOTE — Progress Notes (Signed)
Chronic Care Management Pharmacy Note  04/29/2021 Name:  MARYE EAGEN MRN:  [id number] DOB:  February 02, 1941  Summary: Patient presents for initial CCM consult. She continues to struggle with significant osteoarthritis pain. She is in the [facility] but has been able to get Eliquis from her cardiologist's office.   Recommendations/Changes made from today's visit: Continue current medications   Plan: CPP follow-up 6 months    Recommended Problem List Changes:  Add: Mixed hyperlipidemia  Subjective: YULIANNA FOLSE is an 80 y.o. year old female who is a primary patient of Steele Sizer, MD.  The CCM team was consulted for assistance with disease management and care coordination needs.    Engaged with patient by telephone for initial visit in response to provider referral for pharmacy case management and/or care coordination services.   Consent to Services:  The patient was given the following information about Chronic Care Management services today, agreed to services, and gave verbal consent: 1. CCM service includes personalized support from designated clinical staff supervised by the primary care provider, including individualized plan of care and coordination with other care providers 2. 24/7 contact phone numbers for assistance for urgent and routine care needs. 3. Service will only be billed when office clinical staff spend 20 minutes or more in a month to coordinate care. 4. Only one practitioner may furnish and bill the service in a calendar month. 5.The patient may stop CCM services at any time (effective at the end of the month) by phone call to the office staff. 6. The patient will be responsible for cost sharing (co-pay) of up to 20% of the service fee (after annual deductible is met). Patient agreed to services and consent obtained.  Patient Care Team: Steele Sizer, MD as PCP - General (Family Medicine) Wellington Hampshire, MD as Consulting Physician (Cardiology) Vladimir Crofts, MD as Consulting Physician (Neurology) Lucilla Lame, MD as Consulting Physician (Gastroenterology) Germaine Pomfret, [facility] (Pharmacist)  Recent office visits: 04/01/21: Patient presented to Dr. Ancil Boozer for follow-up. 02/03/21: Patient presented to Dr. Ancil Boozer for follow-up. Cannot afford sleep apnea supplies.   Recent consult visits: 04/17/21: Patient presented to Tamala Julian, PA (Ortho) for follow-up. Hylan injection  02/19/21: Patient presented to Dr. Raeanne Barry (Cardiology)   Hospital visits: 03/29/21: Patient presented to ED for diverticulitis.    Objective:  Lab Results  Component Value Date   CREATININE 0.90 03/29/2021   BUN 16 03/29/2021   GFRNONAA >60 03/29/2021   GFRAA 63 02/22/2020   NA 136 03/29/2021   K 3.8 03/29/2021   CALCIUM 9.4 03/29/2021   CO2 25 03/29/2021   GLUCOSE 144 (H) 03/29/2021    Lab Results  Component Value Date/Time   HGBA1C 5.7 (H) 02/03/2021 09:16 AM   HGBA1C 6.1 (H) 02/22/2020 08:23 AM    Last diabetic Eye exam: No results found for: HMDIABEYEEXA  Last diabetic Foot exam: No results found for: HMDIABFOOTEX   Lab Results  Component Value Date   CHOL 110 02/03/2021   HDL 47 (L) 02/03/2021   LDLCALC 48 02/03/2021   LDLDIRECT 46 01/09/2019   TRIG 72 02/03/2021   CHOLHDL 2.3 02/03/2021    Hepatic Function Latest Ref Rng & Units 03/29/2021 02/03/2021 02/22/2020  Total Protein 6.5 - 8.1 g/dL 7.4 7.7 7.3  Albumin 3.5 - 5.0 g/dL 3.7 - -  AST 15 - 41 U/L $Remo'18 19 19  'MkDXv$ ALT 0 - 44 U/L $Remo'23 19 21  'nCicq$ Alk Phosphatase 38 - 126 U/L 61 - -  Total Bilirubin 0.3 - 1.2 mg/dL 1.1 0.4 0.3    Lab Results  Component Value Date/Time   TSH 1.190 02/15/2016 11:46 AM   TSH 1.72 01/26/2016 09:29 AM    CBC Latest Ref Rng & Units 03/29/2021 02/03/2021 10/24/2020  WBC 4.0 - 10.5 K/uL 20.8(H) 6.4 7.3  Hemoglobin 12.0 - 15.0 g/dL 14.1 14.3 12.8  Hematocrit 36.0 - 46.0 % 40.8 43.1 38.0  Platelets 150 - 400 K/uL 338 290 380    Lab Results  Component Value Date/Time    VD25OH 27 (L) 02/03/2021 09:16 AM   VD25OH 20 (L) 02/22/2020 08:23 AM    Clinical ASCVD: Yes  The ASCVD Risk score (Arnett DK, et al., 2019) failed to calculate for the following reasons:   The valid total cholesterol range is 130 to 320 mg/dL    Depression screen [facility] 2/9 04/01/2021 02/03/2021 08/25/2020  Decreased Interest 0 0 0  Down, Depressed, Hopeless 0 0 0  PHQ - 2 Score 0 0 0  Altered sleeping - - -  Tired, decreased energy - - -  Change in appetite - - -  Feeling bad or failure about yourself  - - -  Trouble concentrating - - -  Moving slowly or fidgety/restless - - -  Suicidal thoughts - - -  PHQ-9 Score - - -  Difficult doing work/chores - - -  Some recent data might be hidden    Social History   Tobacco Use  Smoking Status Never  Smokeless Tobacco Never  Tobacco Comments   smoking cessation materials not required   BP Readings from Last 3 Encounters:  04/01/21 134/86  03/29/21 112/73  02/19/21 110/70   Pulse Readings from Last 3 Encounters:  04/01/21 81  03/29/21 94  02/19/21 (!) 58   Wt Readings from Last 3 Encounters:  04/01/21 164 lb (74.4 kg)  02/19/21 164 lb 8 oz (74.6 kg)  02/03/21 160 lb (72.6 kg)   BMI Readings from Last 3 Encounters:  04/01/21 29.05 kg/m  02/19/21 29.14 kg/m  02/03/21 28.34 kg/m    Assessment/Interventions: Review of patient past medical history, allergies, medications, health status, including review of consultants reports, laboratory and other test data, was performed as part of comprehensive evaluation and provision of chronic care management services.   SDOH:  (Social Determinants of Health) assessments and interventions performed: Yes SDOH Interventions    Flowsheet Row Most Recent Value  SDOH Interventions   Financial Strain Interventions Intervention Not Indicated      SDOH Screenings   Alcohol Screen: Low Risk    Last Alcohol Screening Score (AUDIT): 0  Depression (PHQ2-9): Low Risk    PHQ-2 Score: 0   Financial Resource Strain: Medium Risk   Difficulty of Paying Living Expenses: Somewhat hard  Food Insecurity: No Food Insecurity   Worried About Charity fundraiser in the Last Year: Never true   Ran Out of Food in the Last Year: Never true  Housing: Low Risk    Last Housing Risk Score: 0  Physical Activity: Insufficiently Active   Days of Exercise per Week: 2 days   Minutes of Exercise per Session: 20 min  Social Connections: Moderately Integrated   Frequency of Communication with Friends and Family: More than three times a week   Frequency of Social Gatherings with Friends and Family: More than three times a week   Attends Religious Services: More than 4 times per year   Active Member of Clubs or Organizations: Yes   Attends  Music therapist: More than 4 times per year   Marital Status: Never married  Stress: No Stress Concern Present   Feeling of Stress : Not at all  Tobacco Use: Low Risk    Smoking Tobacco Use: Never   Smokeless Tobacco Use: Never   Passive Exposure: Not on file  Transportation Needs: No Transportation Needs   Lack of Transportation (Medical): No   Lack of Transportation (Non-Medical): No    CCM Care Plan  Allergies  Allergen Reactions   Ace Inhibitors Swelling    Patient states her tongue swell.   Penicillins Anxiety    Has patient had a PCN reaction causing immediate rash, facial/tongue/throat swelling, SOB or lightheadedness with hypotension: No Has patient had a PCN reaction causing severe rash involving mucus membranes or skin necrosis: No Has patient had a PCN reaction that required hospitalization No Has patient had a PCN reaction occurring within the last 10 years: No If all of the above answers are "NO", then may proceed with Cephalosporin use.    Medications Reviewed Today     Reviewed by Neldon Labella, RN (Registered Nurse) on 04/02/21 at [place] List Status: <None>   Medication Order Taking? Sig Documenting Provider  Last Dose Status Informant  albuterol (VENTOLIN HFA) 108 (90 Base) MCG/ACT inhaler [id number] No INHALE 2 PUFFS INTO THE LUNGS EVERY 6 HOURS AS NEEDED FOR WHEEZING OR SHORTNESS OF Cherlynn June, Drue Stager, MD Taking Active   amLODipine (NORVASC) 5 MG tablet [id number] No TAKE 1 TABLET(5 MG) BY MOUTH IN THE MORNING AND AT BEDTIME. HOLD EVENING DOSE IF BLOOD PRESSURE BELOW 115/75 Steele Sizer, MD Taking Active   amLODipine (NORVASC) 5 MG tablet [id number] No Take by mouth. [provider] Taking Active   amoxicillin-clavulanate (AUGMENTIN) 875-125 MG tablet [id number] No Take 1 tablet by mouth 2 (two) times daily for 7 days. Rada Hay, MD Taking Active   apixaban [facility]) 5 MG TABS tablet [id number] No TAKE 1 TABLET(5 MG) BY MOUTH TWICE DAILY Wellington Hampshire, MD Taking Active Self  diltiazem (CARDIZEM) 30 MG tablet [id number] No Take 1 tablet (30 mg total) by mouth every 6 (six) hours as needed (for tachycardia/recurrent Afib.). Theora Gianotti, NP Taking Active Self  meclizine (ANTIVERT) 25 MG tablet [id number] No Take 25 mg by mouth 3 (three) times daily as needed (vertigo/dizziness). [provider] Taking Active Self  rosuvastatin (CRESTOR) 10 MG tablet [id number] No Take 1 tablet (10 mg total) by mouth at bedtime. Steele Sizer, MD Taking Active             Patient Active Problem List   Diagnosis Date Noted   Osteoarthritis of right knee 05/05/2020   History of colonic polyps    Polyp of transverse colon    Aneurysm ([place]) 02/08/2018   History of CVA (cerebrovascular accident) without residual deficits 01/24/2018   Aneurysm of ophthalmic artery 01/24/2018   TIA (transient ischemic attack) 01/22/2018   Impingement syndrome of shoulder region 01/02/2018   History of cataract surgery, right 09/01/2016   Paroxysmal atrial fibrillation ([place]) 02/24/2016   Vertigo 02/24/2016   Low HDL (under 40) 02/17/2016   Dizziness 02/15/2016   Hx of colonic polyps     Benign neoplasm of sigmoid colon    Asthma, mild intermittent 02/21/2015   Hyperglycemia 02/21/2015   Essential hypertension 02/20/2015   Inconclusive mammogram due to dense breasts 01/02/2015   Dense breast 01/02/2015   Abnormal EKG 02/23/2013    Immunization History  Administered  Date(s) Administered   Fluad Quad(high Dose 65+) 04/16/2019, 04/21/2020   Influenza, High Dose Seasonal PF 02/20/2015, 05/11/2017, 05/04/2018, 04/01/2021   Influenza,inj,quad, With Preservative 03/28/2020   Influenza-Unspecified 02/20/2016   PFIZER(Purple Top)SARS-COV-2 Vaccination 07/05/2019, 07/26/2019, 04/23/2020, 12/22/2020   Pneumococcal Conjugate-13 10/17/2017   Pneumococcal Polysaccharide-23 05/31/2019   Tdap 02/22/2012, 02/03/2021   Zoster Recombinat (Shingrix) 12/04/2019    Conditions to be addressed/monitored:  Hypertension, Hyperlipidemia, Atrial Fibrillation, Asthma, Osteoarthritis, and history of CVA  Care Plan : General Pharmacy (Adult)  Updates made by Germaine Pomfret, RPH since 04/29/2021 12:00 AM     Problem: Hypertension, Hyperlipidemia, Atrial Fibrillation, Asthma, Osteoarthritis, and history of CVA   Priority: High     Long-Range Goal: Patient-Specific Goal   Start Date: 04/29/2021  Expected End Date: 04/29/2022  This Visit's Progress: On track  Priority: High  Note:   Current Barriers:  No barriers noted  Pharmacist Clinical Goal(s):  Patient will maintain control of blood pressure as evidenced by BP less than 140/90  through collaboration with PharmD and provider.   Interventions: 1:1 collaboration with Steele Sizer, MD regarding development and update of comprehensive plan of care as evidenced by provider attestation and co-signature Inter-disciplinary care team collaboration (see longitudinal plan of care) Comprehensive medication review performed; medication list updated in electronic medical record  Hypertension (BP goal <140/90) -Controlled -Current  treatment: Amlodipine 5 mg twice daily (will hold evening dose if BP low Diltiazem 30 mg every 6 hours as needed  -Medications previously tried: NA  -Current home readings: 120s/80s typically. Uses wrist monitor and plans to by arm cuff. -Current dietary habits: Tries to eat vegetables.  -Current exercise habits: Minimal Exercise due to knee pain -Denies hypotensive/hypertensive symptoms -Recommended to continue current medication  Hyperlipidemia: (LDL goal < 70) -Controlled -Current treatment: Rosuvastatin 10 mg daily  -Medications previously tried: NA  -Recommended to continue current medication  Atrial Fibrillation (Goal: prevent stroke and major bleeding) -Controlled -CHADSVASC: 6 -Current treatment: Rate control: Diltiazem 30 mg every 6 hours as needed Anticoagulation: Eliquis 5 mg twice daily  -Medications previously tried: NA -Currently in [place]. Typically enters [facility] towards ends of the year, able to continue on medication with samples from Dr. Tyrell Antonio office -Recommended to continue current medication  Asthma (Goal: control symptoms and prevent exacerbations) -Controlled -Current treatment  Albuterol HFA 2 puffs every 6 hours as needed  -Medications previously tried: NA  -Exacerbations requiring treatment in last 6 months: None -Frequency of rescue inhaler use: < 1x monthly  -Recommended to continue current medication  Osteoarthritis (Goal: Minimize symptoms) -Uncontrolled -Current treatment  Acetaminophen CR 650 mg every 8 hours  -Medications previously tried: NA -Tries to limit acetaminophen use due to fears of addiction. Needs knee surgery.   -Counseled on safety of acetaminophen and to limit use to 3000 mg daily  -Recommended to continue current medication   Patient Goals/Self-Care Activities Patient will:  - check blood pressure weekly, document, and provide at future appointments  Follow Up Plan: Telephone follow up appointment with care  management team member scheduled for:  10/28/2021 at 8:30 AM      Medication Assistance: None required.  Patient affirms current coverage meets needs.  Compliance/Adherence/Medication fill history: Care Gaps: Zoster Vaccines COVID-19 Vaccine Booster 5  Star-Rating Drugs: Rosuvastatin 10 mg last filled on 04/23/2021 for a 90-Day supply with [facility]  Patient's preferred pharmacy is:  [facility] [place], [place] [address] [place] [address] Phone: [phone] Fax: [phone]  Uses pill box? Yes Pt endorses 100% compliance  We discussed: Current pharmacy is preferred with insurance plan and patient is satisfied with pharmacy services Patient decided to: Continue current medication management strategy  Care Plan and Follow Up Patient Decision:  Patient agrees to Care Plan and Follow-up.  Plan: Telephone follow up appointment with care management team member scheduled for:  10/28/2021 at 8:30 AM  Malva Limes, [facility] [phone]

## 2021-04-29 NOTE — Patient Instructions (Signed)
Visit Information It was great speaking with you today!  Please let me know if you have any questions about our visit.   Goals Addressed             This Visit's Progress    Track and Manage My Blood Pressure-Hypertension       Timeframe:  Long-Range Goal Priority:  High Start Date: 04/29/2021                             Expected End Date: 04/29/2022                      Follow Up within 90 days   - check blood pressure weekly    Why is this important?   You won't feel high blood pressure, but it can still hurt your blood vessels.  High blood pressure can cause heart or kidney problems. It can also cause a stroke.  Making lifestyle changes like losing a little weight or eating less salt will help.  Checking your blood pressure at home and at different times of the day can help to control blood pressure.  If the doctor prescribes medicine remember to take it the way the doctor ordered.  Call the office if you cannot afford the medicine or if there are questions about it.     Notes:         Patient Care Plan: Diverticulitis     Problem Identified: Diverticulitis      Goal: Symptom Management   Start Date: 04/02/2021  Expected End Date: 05/02/2021  Priority: High  Note:    Current Barriers:  Chronic Disease Management support and educational needs related to Diverticulitis and Liver Lesion in patient with HTN and A-Fib.  Case Manager Clinical Goal(s):  Over the next 30 days, patient will not require hospitalization due to complications related to diverticulitis.  Over the next 30 days, patient will complete diagnostic imaging.  Interventions:  Collaboration with Steele Sizer, MD regarding development and update of comprehensive plan of care as evidenced by provider attestation and co-signature Inter-disciplinary care team collaboration (see longitudinal plan of care) Reviewed medications. Advised to continue medications as prescribed. Advised to complete full  course of antibiotics. Advised to notify provider with concerns regarding prescribed regimen.  Discussed nutritional intake and importance of complying with recommended diet. Advised to continue monitoring intake and comply with recommended diet to prevent a diverticulitis flare.  Advised to assess symptoms and contact the clinic if symptoms return. Thoroughly discussed worsening s/sx that require immediate medical attention. Update on 04/13/21: Message received regarding medications. Patient reports taking tylenol at regular intervals since 04/11/21 due to chronic left knee pain. Outpatient Imaging staff requested confirmation that she can continue taking tylenol d/t pending imaging r/t liver lesion. Collaborated with Dr. Ancil Boozer. Advised that patient can continue taking as needed. Last liver enzymes were within normal range. Reviewed pre-imaging instructions with patient. She agreed to call with questions/concerns if needed.      Self Care/Patient Goals: Take medications as prescribed Adhere to recommended diet Complete diagnostic imaging as scheduled on 04/15/21 Contact the clinic for questions and new concerns as needed   Follow Up Plan:  Will follow up within the next month    Patient Care Plan: General Pharmacy (Adult)     Problem Identified: Hypertension, Hyperlipidemia, Atrial Fibrillation, Asthma, Osteoarthritis, and history of CVA   Priority: High     Long-Range Goal: Patient-Specific  Goal   Start Date: 04/29/2021  Expected End Date: 04/29/2022  This Visit's Progress: On track  Priority: High  Note:   Current Barriers:  No barriers noted  Pharmacist Clinical Goal(s):  Patient will maintain control of blood pressure as evidenced by BP less than 140/90  through collaboration with PharmD and provider.   Interventions: 1:1 collaboration with Steele Sizer, MD regarding development and update of comprehensive plan of care as evidenced by provider attestation and  co-signature Inter-disciplinary care team collaboration (see longitudinal plan of care) Comprehensive medication review performed; medication list updated in electronic medical record  Hypertension (BP goal <140/90) -Controlled -Current treatment: Amlodipine 5 mg twice daily (will hold evening dose if BP low Diltiazem 30 mg every 6 hours as needed  -Medications previously tried: NA  -Current home readings: 120s/80s typically. Uses wrist monitor and plans to by arm cuff. -Current dietary habits: Tries to eat vegetables.  -Current exercise habits: Minimal Exercise due to knee pain -Denies hypotensive/hypertensive symptoms -Recommended to continue current medication  Hyperlipidemia: (LDL goal < 70) -Controlled -Current treatment: Rosuvastatin 10 mg daily  -Medications previously tried: NA  -Recommended to continue current medication  Atrial Fibrillation (Goal: prevent stroke and major bleeding) -Controlled -CHADSVASC: 6 -Current treatment: Rate control: Diltiazem 30 mg every 6 hours as needed Anticoagulation: Eliquis 5 mg twice daily  -Medications previously tried: NA -Currently in [place]. Typically enters [facility] towards ends of the year, able to continue on medication with samples from Dr. Tyrell Antonio office -Recommended to continue current medication  Asthma (Goal: control symptoms and prevent exacerbations) -Controlled -Current treatment  Albuterol HFA 2 puffs every 6 hours as needed  -Medications previously tried: NA  -Exacerbations requiring treatment in last 6 months: None -Frequency of rescue inhaler use: < 1x monthly  -Recommended to continue current medication  Osteoarthritis (Goal: Minimize symptoms) -Uncontrolled -Current treatment  Acetaminophen CR 650 mg every 8 hours  -Medications previously tried: NA -Tries to limit acetaminophen use due to fears of addiction. Needs knee surgery.   -Counseled on safety of acetaminophen and to limit use to 3000 mg daily   -Recommended to continue current medication   Patient Goals/Self-Care Activities Patient will:  - check blood pressure weekly, document, and provide at future appointments  Follow Up Plan: Telephone follow up appointment with care management team member scheduled for:  10/28/2021 at 8:30 AM     Ms. Ranganathan was given information about Chronic Care Management services today including:  CCM service includes personalized support from designated clinical staff supervised by her physician, including individualized plan of care and coordination with other care providers 24/7 contact phone numbers for assistance for urgent and routine care needs. Standard insurance, coinsurance, copays and deductibles apply for chronic care management only during months in which we provide at least 20 minutes of these services. Most insurances cover these services at 100%, however patients may be responsible for any copay, coinsurance and/or deductible if applicable. This service may help you avoid the need for more expensive face-to-face services. Only one practitioner may furnish and bill the service in a calendar month. The patient may stop CCM services at any time (effective at the end of the month) by phone call to the office staff.  Patient agreed to services and verbal consent obtained.   Patient verbalizes understanding of instructions provided today and agrees to view in [place].   Malva Limes, [facility] [phone]

## 2021-05-05 ENCOUNTER — Emergency Department
Admission: EM | Admit: 2021-05-05 | Discharge: 2021-05-05 | Disposition: A | Discharge status: Home / Self Care | Payer: Medicare Other | Attending: Emergency Medicine | Admitting: Emergency Medicine

## 2021-05-05 ENCOUNTER — Encounter: Payer: Self-pay | Admitting: Emergency Medicine

## 2021-05-05 ENCOUNTER — Other Ambulatory Visit: Payer: Self-pay

## 2021-05-05 DIAGNOSIS — R6889 Other general symptoms and signs: Secondary | ICD-10-CM | POA: Diagnosis not present

## 2021-05-05 DIAGNOSIS — Z79899 Other long term (current) drug therapy: Secondary | ICD-10-CM | POA: Insufficient documentation

## 2021-05-05 DIAGNOSIS — I10 Essential (primary) hypertension: Secondary | ICD-10-CM | POA: Diagnosis not present

## 2021-05-05 DIAGNOSIS — M1712 Unilateral primary osteoarthritis, left knee: Secondary | ICD-10-CM | POA: Insufficient documentation

## 2021-05-05 DIAGNOSIS — I480 Paroxysmal atrial fibrillation: Secondary | ICD-10-CM | POA: Insufficient documentation

## 2021-05-05 DIAGNOSIS — Z7901 Long term (current) use of anticoagulants: Secondary | ICD-10-CM | POA: Diagnosis not present

## 2021-05-05 DIAGNOSIS — Z743 Need for continuous supervision: Secondary | ICD-10-CM | POA: Diagnosis not present

## 2021-05-05 DIAGNOSIS — R52 Pain, unspecified: Secondary | ICD-10-CM | POA: Diagnosis not present

## 2021-05-05 DIAGNOSIS — J4520 Mild intermittent asthma, uncomplicated: Secondary | ICD-10-CM | POA: Insufficient documentation

## 2021-05-05 DIAGNOSIS — R5381 Other malaise: Secondary | ICD-10-CM | POA: Diagnosis not present

## 2021-05-05 MED ORDER — PREDNISONE 20 MG PO TABS
60.0000 mg | ORAL_TABLET | Freq: Once | ORAL | Status: AC
  Administered 2021-05-05: 60 mg via ORAL
  Filled 2021-05-05: qty 3

## 2021-05-05 MED ORDER — HYDROCODONE-ACETAMINOPHEN 5-325 MG PO TABS
1.0000 | ORAL_TABLET | Freq: Once | ORAL | Status: AC
  Administered 2021-05-05: 1 via ORAL
  Filled 2021-05-05: qty 1

## 2021-05-05 MED ORDER — HYDROCODONE-ACETAMINOPHEN 5-325 MG PO TABS: 1.0000 | ORAL_TABLET | ORAL | 0 refills | Status: DC | PRN

## 2021-05-05 MED ORDER — ACETAMINOPHEN 500 MG PO TABS
1000.0000 mg | ORAL_TABLET | Freq: Once | ORAL | Status: AC
  Administered 2021-05-05: 1000 mg via ORAL
  Filled 2021-05-05: qty 2

## 2021-05-05 MED ORDER — PREDNISONE 10 MG PO TABS: 10.0000 mg | ORAL_TABLET | ORAL | 0 refills | Status: DC

## 2021-05-05 NOTE — ED Triage Notes (Signed)
Left knee is more swollen than the right knee.

## 2021-05-05 NOTE — ED Triage Notes (Signed)
Pt reports that she is to have a knee replacement and was to have an appointment with Dr. Marry Guan today. Pt reports that she took Ibuprofen at 1300 before coming in. Denies any injury.

## 2021-05-05 NOTE — ED Triage Notes (Signed)
First RN Note: Pt to ED via [facility] with c/o L knee pain. Per EMS pt with hx of knee pain, has been told that she needs knee replacement surgery but has not had it. Pt arrives in recliner and repeatedly yelling and moaning in th lobby, per EMS no known injury.

## 2021-05-05 NOTE — ED Provider Notes (Signed)
[facility] Emergency Department Provider Note  ____________________________________________  Time seen: Approximately 5:54 PM  I have reviewed the triage vital signs and the nursing notes.   HISTORY  Chief Complaint Knee Pain    HPI [name] is a 80 y.o. female who presents emergency department with increased left knee pain.  Patient states that she has been told that she needs bilateral knee replacements, however she has had increasing pain in the left knee.  She states that it is swollen.  There is no warmth, no erythema reported.  No recent injuries or trauma.  She recently had an MRI 10 days ago and is scheduled to see the orthopedic surgeon in 2 days to go over results and schedule for knee replacement.  Patient states that at this point the pain is so severe that she can no longer ambulate on the knee.  No hip or ankle pain.  Again no trauma.  No other complaints at this time.       Past Medical History:  Diagnosis Date   History of stress test    a. 02/2013 Nl stress test.   Hypertension    Insomnia, unspecified    Mitral regurgitation    a. echo 01/2016: nl LV sys fxn, mild MR, w/o pulm htn, nl atrial size   Osteoporosis, unspecified    Paroxysmal atrial fibrillation ([place]) 01/2016   a. diagnosed 01/2016; b. 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   TIA (transient ischemic attack) 01/22/2018    Impingement syndrome of shoulder region 01/02/2018   History of cataract surgery, right 09/01/2016   Paroxysmal atrial fibrillation ([place]) 02/24/2016   Vertigo 02/24/2016   Low HDL (under 40) 02/17/2016   Dizziness 02/15/2016   Hx of colonic polyps    Benign neoplasm of sigmoid colon    Asthma, mild intermittent 02/21/2015   Hyperglycemia 02/21/2015   Essential hypertension 02/20/2015   Inconclusive mammogram due to dense breasts 01/02/2015   Dense breast 01/02/2015   Abnormal EKG 02/23/2013    Past Surgical History:  Procedure Laterality Date   ABDOMINAL HYSTERECTOMY     CATARACT EXTRACTION Right 08/31/2016   COLONOSCOPY WITH PROPOFOL N/A 03/04/2015   Procedure: COLONOSCOPY WITH PROPOFOL;  Surgeon: Lucilla Lame, MD;  Location: [facility] ENDOSCOPY;  Service: Endoscopy;  Laterality: N/A;   COLONOSCOPY WITH PROPOFOL N/A 04/01/2020   Procedure: COLONOSCOPY WITH PROPOFOL;  Surgeon: Lucilla Lame, MD;  Location: [facility] ENDOSCOPY;  Service: Endoscopy;  Laterality: N/A;   DIAGNOSTIC LAPAROSCOPY     REMOVAL OF SCAR TISSUE (ADHESIONS)   KNEE ARTHROSCOPY Right 10/08/2020   Procedure: ARTHROSCOPY KNEE;  Surgeon: Dereck Leep, MD;  Location: [facility] ORS;  Service: Orthopedics;  Laterality: Right;   KNEE ARTHROSCOPY Right 10/21/2020   Procedure: Arthroscopic irrigation and debridement right knee;  Surgeon: Dereck Leep, MD;  Location: [facility] ORS;  Service: Orthopedics;  Laterality: Right;    Prior  to Admission medications   Medication Sig Start Date End Date Taking? Authorizing Provider  HYDROcodone-acetaminophen (NORCO/VICODIN) 5-325 MG tablet Take 1 tablet by mouth every 4 (four) hours as needed for moderate pain. 05/05/21 05/05/22 Yes Cainan Trull, Charline Bills, PA-C  predniSONE (DELTASONE) 10 MG tablet Take 1 tablet (10 mg total) by mouth as directed. 05/05/21  Yes Robinette Esters, Charline Bills, PA-C  acetaminophen (TYLENOL) 650 MG CR tablet Take 650 mg by mouth every 8 (eight) hours as needed for pain.    [provider]  albuterol (VENTOLIN HFA) 108 (90 Base) MCG/ACT inhaler INHALE 2 PUFFS INTO THE LUNGS EVERY 6 HOURS AS NEEDED FOR WHEEZING OR SHORTNESS OF BREATH 10/31/20   Ancil Boozer, Drue Stager, MD  amLODipine (NORVASC) 5 MG tablet TAKE 1 TABLET(5 MG) BY MOUTH IN THE MORNING AND AT BEDTIME. HOLD EVENING DOSE IF BLOOD PRESSURE BELOW 115/75 02/03/21   Ancil Boozer, Drue Stager, MD  apixaban (ELIQUIS) 5 MG TABS tablet TAKE 1 TABLET(5 MG) BY MOUTH TWICE DAILY 09/29/20   Wellington Hampshire, MD  Calcium-Magnesium-Vitamin D (CALCIUM 1200+D3 PO) Take 1 tablet by mouth daily with breakfast. 1200 mg calcium / 1000 units Vitamin D3    [provider]  diltiazem (CARDIZEM) 30 MG tablet Take 1 tablet (30 mg total) by mouth every 6 (six) hours as needed (for tachycardia/recurrent Afib.). 02/16/16   Theora Gianotti, NP  meclizine (ANTIVERT) 25 MG tablet Take 25 mg by mouth 3 (three) times daily as needed (vertigo/dizziness).    [provider]  rosuvastatin (CRESTOR) 10 MG tablet Take 1 tablet (10 mg total) by mouth at bedtime. 02/03/21   Steele Sizer, MD    Allergies Ace inhibitors and Penicillins  Family History  Problem Relation Age of Onset   COPD Father    Hypertension Mother    Aneurysm Mother    Hypertension Brother    Stroke Brother    Cancer Brother        pancreatic   Healthy Brother     Social History Social History   Tobacco Use   Smoking status: Never   Smokeless tobacco: Never   Tobacco comments:    smoking cessation materials not required  Vaping Use   Vaping Use: Never used  Substance Use Topics   Alcohol use: No    Alcohol/week: 0.0 standard drinks   Drug use: No     Review of Systems  Constitutional: No fever/chills Eyes: No visual changes. No discharge ENT: No upper respiratory complaints. Cardiovascular: no chest pain. Respiratory: no cough. No SOB. Gastrointestinal: No abdominal pain.  No nausea, no vomiting.  No diarrhea.  No constipation. Musculoskeletal:  Left knee pain with edema Skin: Negative for rash, abrasions, lacerations, ecchymosis. Neurological: Negative for headaches, focal weakness or numbness.  10 System ROS otherwise negative.  ____________________________________________   PHYSICAL EXAM:  VITAL SIGNS: ED Triage Vitals  Enc Vitals Group     BP 05/05/21 1439 (!) 163/72     Pulse Rate 05/05/21 1439 (!) 102     Resp 05/05/21 1439 (!) 22     Temp 05/05/21 1439 (!) 97.5 F (36.4 C)     Temp Source 05/05/21 1439 Oral     SpO2 05/05/21 1439 99 %     Weight 05/05/21 1441 164 lb 0.4 oz (74.4 kg)     Height 05/05/21 1441 5\' 3"  (1.6 m)     Head Circumference --      Peak Flow --      Pain Score 05/05/21 1441 10  Pain Loc --      Pain Edu? --      Excl. in [place]? --      Constitutional: Alert and oriented. Well appearing and in no acute distress. Eyes: Conjunctivae are normal. PERRL. EOMI. Head: Atraumatic. ENT:      Ears:       Nose: No congestion/rhinnorhea.      Mouth/Throat: Mucous membranes are moist.  Neck: No stridor.    Cardiovascular: Normal rate, regular rhythm. Normal S1 and S2.  Good peripheral circulation. Respiratory: Normal respiratory effort without tachypnea or retractions. Lungs CTAB. Good air entry to the bases with no decreased or absent breath sounds. Musculoskeletal: Full range of motion to all extremities. No gross deformities appreciated.  Visualization of the left knee reveals slightly increased edema of the left knee when compared with right.  Positive for ballottement in the suprapatellar space.  No erythema.  No warmth to palpation.   Generally/globally tender to palpation along the anterior knee.   Examination of the left hip and left ankle is unremarkable.  Dorsalis pedis pulse intact distally. Neurologic:  Normal speech and language. No gross focal neurologic deficits are appreciated.  Skin:  Skin is warm, dry and intact. No rash noted. Psychiatric: Mood and affect are normal. Speech and  behavior are normal. Patient exhibits appropriate insight and judgement.   ____________________________________________   LABS (all labs ordered are listed, but only abnormal results are displayed)  Labs Reviewed - No data to display ____________________________________________  EKG   ____________________________________________  RADIOLOGY  Reviewed MRI from 04/23/2021.  Patient has tried complete departmental degeneration.  No meniscal tear.  Positive for large joint effusion   No results found.  ____________________________________________    PROCEDURES  Procedure(s) performed:    Procedures    Medications  acetaminophen (TYLENOL) tablet 1,000 mg (1,000 mg Oral Given 05/05/21 1449)  predniSONE (DELTASONE) tablet 60 mg (60 mg Oral Given 05/05/21 1851)  HYDROcodone-acetaminophen (NORCO/VICODIN) 5-325 MG per tablet 1 tablet (1 tablet Oral Given 05/05/21 1851)     ____________________________________________   INITIAL IMPRESSION / ASSESSMENT AND PLAN / ED COURSE  Pertinent labs & imaging results that were available during my care of the patient were reviewed by me and considered in my medical decision making (see chart for details).  Review of the [place] CSRS was performed in accordance of the [place] prior to dispensing any controlled drugs.           Patient's diagnosis is consistent with tricompartmental arthritis.  Patient presented to the emergency department left knee pain.  Patient has had no reported injury.  There was no warmth or erythema on exam.  Low concern for infection.  Patient does have ballottement and has a known large joint effusion as identified on MRI from 04/23/2021.  Patient has been informed that she needs knee replacements bilaterally and is here for pain control.  MRI does suggest the need for likely knee replacement and she is scheduled to see her orthopedic surgeon in 2 days for scheduling of her replacement.  Patient will have short course of  steroid, limited amount of pain medication to reduce some of the inflammation, control pain until she can see orthopedics in 2-day..  Return precautions discussed with the patient.  Patient is given ED precautions to return to the ED for any worsening or new symptoms.     ____________________________________________  FINAL CLINICAL IMPRESSION(S) / ED DIAGNOSES  Final diagnoses:  Tricompartment osteoarthritis of left knee  NEW MEDICATIONS STARTED DURING THIS VISIT:  ED Discharge Orders          Ordered    predniSONE (DELTASONE) 10 MG tablet  As directed       Note to Pharmacy: Take on a pattern of 6, 6, 5, 5, 4, 4, 3, 3, 2, 2, 1, 1   05/05/21 1851    HYDROcodone-acetaminophen (NORCO/VICODIN) 5-325 MG tablet  Every 4 hours PRN        05/05/21 1851                This chart was dictated using voice recognition software/Dragon. Despite best efforts to proofread, errors can occur which can change the meaning. Any change was purely unintentional.    Darletta Moll, PA-C 05/05/21 1852    Vanessa [place], MD 05/08/21 [phone]

## 2021-05-05 NOTE — ED Notes (Signed)
E-signature pad unavailable - Pt verbalized understanding of D/C information - no additional concerns at this time.  

## 2021-05-07 ENCOUNTER — Ambulatory Visit: Payer: Medicare Other | Admitting: Family Medicine

## 2021-05-08 DIAGNOSIS — M79605 Pain in left leg: Secondary | ICD-10-CM | POA: Diagnosis not present

## 2021-05-08 DIAGNOSIS — M1712 Unilateral primary osteoarthritis, left knee: Secondary | ICD-10-CM | POA: Diagnosis not present

## 2021-05-08 DIAGNOSIS — M5416 Radiculopathy, lumbar region: Secondary | ICD-10-CM | POA: Diagnosis not present

## 2021-05-08 DIAGNOSIS — M5442 Lumbago with sciatica, left side: Secondary | ICD-10-CM | POA: Diagnosis not present

## 2021-05-08 DIAGNOSIS — G8929 Other chronic pain: Secondary | ICD-10-CM | POA: Diagnosis not present

## 2021-05-10 DIAGNOSIS — M79605 Pain in left leg: Secondary | ICD-10-CM | POA: Insufficient documentation

## 2021-05-11 ENCOUNTER — Other Ambulatory Visit: Payer: Self-pay | Admitting: Orthopedic Surgery

## 2021-05-11 ENCOUNTER — Telehealth: Payer: Self-pay | Admitting: Cardiovascular Disease

## 2021-05-11 DIAGNOSIS — G8929 Other chronic pain: Secondary | ICD-10-CM

## 2021-05-11 DIAGNOSIS — M5416 Radiculopathy, lumbar region: Secondary | ICD-10-CM

## 2021-05-11 NOTE — Telephone Encounter (Signed)
   [facility] Pre-operative Risk Assessment    Patient Name: [name]  DOB: 1940/11/27 MRN: [id number]  HEARTCARE STAFF:  - IMPORTANT!!!!!! Under Visit Info/Reason for Call, type in Other and utilize the format Clearance MM/DD/YY or Clearance TBD. Do not use dashes or single digits. - Please review there is not already an duplicate clearance open for this procedure. - If request is for dental extraction, please clarify the # of teeth to be extracted. - If the patient is currently at the dentist's office, call Pre-Op Callback Staff (MA/nurse) to input urgent request.  - If the patient is not currently in the dentist office, please route to the Pre-Op pool.  Request for surgical clearance:  What type of surgery is being performed? Epidural steroid injection - MRI lumbar   When is this surgery scheduled? TBD  What type of clearance is required (medical clearance vs. Pharmacy clearance to hold med vs. Both)? both  Are there any medications that need to be held prior to surgery and how long? Hold Eliquis x 3 days   Practice name and name of physician performing surgery? [facility] - Dr Marry Guan   What is the office phone number? [phone]   7.   What is the office fax number? [phone]  8.   Anesthesia type (None, local, MAC, general) ? Not listed    Ace Gins 05/11/2021, 2:40 PM  _________________________________________________________________   (provider comments below)

## 2021-05-11 NOTE — Telephone Encounter (Signed)
Will route to pharm for input on anticoag.

## 2021-05-12 NOTE — Telephone Encounter (Signed)
   Name: [name] DOB: 1940-08-12  MRN: [id number]  Primary Cardiologist: None  Chart reviewed as part of pre-operative protocol coverage.   80 yo female with Paroxysmal atrial fibrillation History of TIA Small left ophthalmic artery aneurysm Hypertension Hyperlipidemia Vertigo  Echo 01/18/2019: EF 55-60 Myoview 01/11/2019: EF 45, no ischemia; low risk  The patient was last seen by Dr. Fletcher Anon 02/19/2021.  RCRI:  Perioperative Risk of Major Cardiac Event is (%): 0.9 (low risk) DASI:  Functional Capacity in METs is: 3.08 (functional status is poor)  Patient was contacted 05/12/2021 in reference to pre-operative risk assessment for pending surgery as outlined below.    Since last seen, [name] has done well without chest pain, shortness of breath, syncope.  She is quite limited by her leg pain and has to use a walker and, at times, a wheelchair.    Her chart was reviewed by our clinical PharmD team.   Eliquis can be held for 3 days prior to the procedure.  Recommendations: Based on ACC/AHA guidelines, the patient is at acceptable risk for the planned procedure and may proceed without further cardiovascular testing.  Apixaban (Eliquis) can be held for 3 days prior to the procedure and should be resumed as soon as possible when it is felt to be safe from a bleeding perspective.    Please call with questions. Richardson Dopp, PA-C 05/12/2021, 12:00 PM

## 2021-05-12 NOTE — Telephone Encounter (Signed)
Patient with diagnosis of afib on Eliquis for anticoagulation.    Procedure: Epidural steroid injection - MRI lumbar  Date of procedure: TBD  CHA2DS2-VASc Score = 7   This indicates a 11.2% annual risk of stroke. The patient's score is based upon: CHF History: 0 HTN History: 1 Diabetes History: 0 Stroke History: 2 Vascular Disease History: 1 Age Score: 2 Gender Score: 1      CrCl 42 ml/min  Patient has previously help Eliquis for 3 days prior to procedure (she was actually only cleared for 2 days) ACC and CHEST guidelines do not recommend parenteral bridging for periprocedural interruption of DOAC therapy  Per office protocol, patient can hold Eliquis for 3 days prior to procedure.

## 2021-05-12 NOTE — Telephone Encounter (Signed)
Notes faxed to surgeon. This phone note will be removed from the preop pool. Richardson Dopp, PA-C  05/12/2021 12:05 PM

## 2021-05-14 ENCOUNTER — Ambulatory Visit: Payer: Self-pay

## 2021-05-14 DIAGNOSIS — I480 Paroxysmal atrial fibrillation: Secondary | ICD-10-CM

## 2021-05-14 DIAGNOSIS — I10 Essential (primary) hypertension: Secondary | ICD-10-CM

## 2021-05-14 NOTE — Patient Instructions (Signed)
Thank you for allowing the Chronic Care Management team to participate in your care.  

## 2021-05-14 NOTE — Chronic Care Management (AMB) (Addendum)
Chronic Care Management   CCM RN Visit Note  05/14/2021 Name: [name] MRN: [id number] DOB: 14-Oct-1940  Subjective: [name] is a 80 y.o. year old female who is a primary care patient of Kristin Sizer, MD. The care management team was consulted for assistance with disease management and care coordination needs.    Engaged with patient by telephone for follow up visit in response to provider referral for case management and care coordination services.   Consent to Services:  The patient was given information about Chronic Care Management services, agreed to services, and gave verbal consent prior to initiation of services.  Please see initial visit note for detailed documentation.   Assessment: Review of patient past medical history, allergies, medications, health status, including review of consultants reports, laboratory and other test data, was performed as part of comprehensive evaluation and provision of chronic care management services.   SDOH (Social Determinants of Health) assessments and interventions performed:  No  CCM Care Plan  Allergies  Allergen Reactions   Ace Inhibitors Swelling    Patient states her tongue swell.   Penicillins Anxiety    Has patient had a PCN reaction causing immediate rash, facial/tongue/throat swelling, SOB or lightheadedness with hypotension: No Has patient had a PCN reaction causing severe rash involving mucus membranes or skin necrosis: No Has patient had a PCN reaction that required hospitalization No Has patient had a PCN reaction occurring within the last 10 years: No If all of the above answers are "NO", then may proceed with Cephalosporin use.    Outpatient Encounter Medications as of 05/14/2021  Medication Sig Note   acetaminophen (TYLENOL) 650 MG CR tablet Take 650 mg by mouth every 8 (eight) hours as needed for pain.    albuterol (VENTOLIN HFA) 108 (90 Base) MCG/ACT inhaler INHALE 2 PUFFS INTO THE LUNGS EVERY 6  HOURS AS NEEDED FOR WHEEZING OR SHORTNESS OF BREATH    amLODipine (NORVASC) 5 MG tablet TAKE 1 TABLET(5 MG) BY MOUTH IN THE MORNING AND AT BEDTIME. HOLD EVENING DOSE IF BLOOD PRESSURE BELOW 115/75    apixaban (ELIQUIS) 5 MG TABS tablet TAKE 1 TABLET(5 MG) BY MOUTH TWICE DAILY    Calcium-Magnesium-Vitamin D (CALCIUM 1200+D3 PO) Take 1 tablet by mouth daily with breakfast. 1200 mg calcium / 1000 units Vitamin D3    diltiazem (CARDIZEM) 30 MG tablet Take 1 tablet (30 mg total) by mouth every 6 (six) hours as needed (for tachycardia/recurrent Afib.). 04/29/2021: Has not needed in >6 months   HYDROcodone-acetaminophen (NORCO/VICODIN) 5-325 MG tablet Take 1 tablet by mouth every 4 (four) hours as needed for moderate pain.    meclizine (ANTIVERT) 25 MG tablet Take 25 mg by mouth 3 (three) times daily as needed (vertigo/dizziness).    predniSONE (DELTASONE) 10 MG tablet Take 1 tablet (10 mg total) by mouth as directed.    rosuvastatin (CRESTOR) 10 MG tablet Take 1 tablet (10 mg total) by mouth at bedtime.    No facility-administered encounter medications on file as of 05/14/2021.    Patient Active Problem List   Diagnosis Date Noted   Osteoarthritis of right knee 05/05/2020   History of colonic polyps    Polyp of transverse colon    Aneurysm ([place]) 02/08/2018   History of CVA (cerebrovascular accident) without residual deficits 01/24/2018   Aneurysm of ophthalmic artery 01/24/2018   TIA (transient ischemic attack) 01/22/2018   Impingement syndrome of shoulder region 01/02/2018   History of cataract surgery, right 09/01/2016  Paroxysmal atrial fibrillation ([facility]) 02/24/2016   Vertigo 02/24/2016   Low HDL (under 40) 02/17/2016   Dizziness 02/15/2016   Hx of colonic polyps    Benign neoplasm of sigmoid colon    Asthma, mild intermittent 02/21/2015   Hyperglycemia 02/21/2015   Essential hypertension 02/20/2015   Inconclusive mammogram due to dense breasts 01/02/2015   Dense breast 01/02/2015    Abnormal EKG 02/23/2013     Patient Care Plan: RN Care Management Plan of Care     Problem Identified: A-Fib, HTN, Diverticulitis, Chronic Joint Pain      Long-Range Goal: Disease Progression Prevented or Minimized   Start Date: 05/14/2021  Expected End Date: 08/12/2021  Priority: High  Note:   Current Barriers:  Chronic Disease Management support and education needs related to Atrial Fibrillation, HLD, and Chronic Joint Pain.  RNCM Clinical Goal(s):  Patient will continue to work with RN Care Manager and the care management team to address care management and care coordination needs related to  Atrial Fibrillation, HLD, and Chronic Joint Pain.  Interventions: 1:1 collaboration with primary care provider regarding development and update of comprehensive plan of care as evidenced by provider attestation and co-signature Inter-disciplinary care team collaboration (see longitudinal plan of care) Evaluation of current treatment plan related to  self management and patient's adherence to plan as established by provider  Pain Interventions: Medications reviewed. Currently taking Extra Strength Tylenol as needed for joint pain. Reviewed provider established plan for pain management Discussed importance of adherence to all scheduled medical appointments. She is scheduled for follow up imaging r/t joint and back pain. Advised patient to report to care team affect of pain on daily activities. Reports currently completing ADL's and IADL's independently. Reports using an assistive device and following activity restrictions as advised. Discussed plan for follow up with the Ortho team. Reports recently being informed that the current knee pain is likely d/t misalignment and complications with her back. She will follow up for imaging tomorrow and discuss plan for treatment with the Ortho team.   AFIB Interventions:  Reviewed A-Fib plan. Reviewed increased risk of stroke due to A-Fib and importance  of adherence to anticoagulant exactly as prescribed. Discussed importance of avoiding NSAIDs due to increased bleeding risk with anticoagulants. Advised to continue taking Tylenol as advised for joint pain. Discussed importance of regular laboratory monitoring as prescribed by the Cardiology team. Reviewed bleeding risk and importance of self-monitoring for signs/symptoms of bleeding. Reports significant bruising to her left thigh. She does not recall injuring the area and unable to recall specific events that may have caused the increased bruising. Notes that the bruising was noted after starting gabapentin. She was advised by Dr. Trena Platt team to discontinue gabapentin until she is evaluated. Reports the area is not swollen, painful or warm to touch. Denies urgent concerns but requesting to be evaluated in the clinic within the next week. Appointment scheduled for clinic visit on 05/18/21. Thoroughly reviewed worsening symptoms that require immediate medical attention.  Patient Goals/Self-Care Activities: Patient will self administer medications as prescribed Patient will attend all scheduled provider appointments Patient will call pharmacy for medication refills Patient will continue to perform ADL's independently Patient will continue to perform IADL's independently Patient will call provider office for new concerns or questions   Follow Up Plan:   Will follow up within the next week.     PLAN Ms. Bachtell will follow up for a clinic visit with a provider on 05/18/21. A member of the care management  team will follow up within the next week.   [name] Care Management [facility] [phone]

## 2021-05-15 ENCOUNTER — Ambulatory Visit
Admission: RE | Admit: 2021-05-15 | Discharge: 2021-05-15 | Disposition: A | Discharge status: Home / Self Care | Payer: Medicare Other | Source: Ambulatory Visit | Attending: Orthopedic Surgery | Admitting: Orthopedic Surgery

## 2021-05-15 DIAGNOSIS — M5442 Lumbago with sciatica, left side: Secondary | ICD-10-CM | POA: Insufficient documentation

## 2021-05-15 DIAGNOSIS — G8929 Other chronic pain: Secondary | ICD-10-CM | POA: Insufficient documentation

## 2021-05-15 DIAGNOSIS — M545 Low back pain, unspecified: Secondary | ICD-10-CM | POA: Diagnosis not present

## 2021-05-15 DIAGNOSIS — M5416 Radiculopathy, lumbar region: Secondary | ICD-10-CM | POA: Insufficient documentation

## 2021-05-18 ENCOUNTER — Encounter: Payer: Self-pay | Admitting: Family Medicine

## 2021-05-18 ENCOUNTER — Other Ambulatory Visit: Payer: Self-pay

## 2021-05-18 ENCOUNTER — Ambulatory Visit (INDEPENDENT_AMBULATORY_CARE_PROVIDER_SITE_OTHER): Payer: Medicare Other | Admitting: Family Medicine

## 2021-05-18 VITALS — BP 120/68 | HR 97 | Temp 97.5°F | Resp 16 | Ht 63.0 in | Wt 159.2 lb

## 2021-05-18 DIAGNOSIS — T148XXA Other injury of unspecified body region, initial encounter: Secondary | ICD-10-CM | POA: Diagnosis not present

## 2021-05-18 NOTE — Progress Notes (Signed)
   SUBJECTIVE:   CHIEF COMPLAINT / HPI:   Bruise on leg - h/o PAF, prior stroke on eliquis. - noticed bruise on leg few weeks ago, progressively got bigger. Thinks it may be starting to recede. - no trauma - no blood in stool, dark tarry stools.  - no falls. Ambulates with walker.  OBJECTIVE:   BP 120/68   Pulse 97   Temp (!) 97.5 F (36.4 C)   Resp 16   Ht 5\' 3"  (1.6 m)   Wt 159 lb 3.2 oz (72.2 kg)   SpO2 100%   BMI 28.20 kg/m   Gen: well appearing, in NAD Card: RRR Lungs: CTAB Ext: WWP, no edema Skin: large superficial bruise to majority of inner L thigh, wraps around to back of L leg. No palpable hematoma, fluctuance or palpable clots or cords, nonTTP.   ASSESSMENT/PLAN:   Bruise No palpable hematoma indicating need for evacuation. No h/o falls, trauma, pain to warrant imaging. Recommend supportive care. If continues to grow, recommend contacting cardiologist to determine risk vs benefit of continuing anticoagulation. F/u prn.   Myles Gip, DO

## 2021-05-18 NOTE — Patient Instructions (Signed)
It was great to see you!  Our plans for today:  - Call your cardiologist if your bruise continues to grow.    Take care and seek immediate care sooner if you develop any concerns.   Dr. Ky Barban

## 2021-05-20 ENCOUNTER — Ambulatory Visit: Payer: Self-pay

## 2021-05-20 DIAGNOSIS — M1711 Unilateral primary osteoarthritis, right knee: Secondary | ICD-10-CM

## 2021-05-20 DIAGNOSIS — Z9181 History of falling: Secondary | ICD-10-CM

## 2021-05-20 DIAGNOSIS — I480 Paroxysmal atrial fibrillation: Secondary | ICD-10-CM

## 2021-05-20 NOTE — Chronic Care Management (AMB) (Signed)
Chronic Care Management   CCM RN Visit Note  05/20/2021 Name: [name] MRN: [id number] DOB: 1941-03-23  Subjective: [name] is a 80 y.o. year old female who is a primary care patient of Steele Sizer, MD. The care management team was consulted for assistance with disease management and care coordination needs.    Engaged with patient by telephone for follow up visit in response to provider referral for case management and care coordination services.   Consent to Services:  The patient was given information about Chronic Care Management services, agreed to services, and gave verbal consent prior to initiation of services.  Please see initial visit note for detailed documentation.    Assessment: Review of patient past medical history, allergies, medications, health status, including review of consultants reports, laboratory and other test data, was performed as part of comprehensive evaluation and provision of chronic care management services.   SDOH (Social Determinants of Health) assessments and interventions performed: No  CCM Care Plan  Allergies  Allergen Reactions   Ace Inhibitors Swelling    Patient states her tongue swell.   Penicillins Anxiety    Has patient had a PCN reaction causing immediate rash, facial/tongue/throat swelling, SOB or lightheadedness with hypotension: No Has patient had a PCN reaction causing severe rash involving mucus membranes or skin necrosis: No Has patient had a PCN reaction that required hospitalization No Has patient had a PCN reaction occurring within the last 10 years: No If all of the above answers are "NO", then may proceed with Cephalosporin use.    Outpatient Encounter Medications as of 05/20/2021  Medication Sig Note   acetaminophen (TYLENOL) 650 MG CR tablet Take 650 mg by mouth every 8 (eight) hours as needed for pain.    albuterol (VENTOLIN HFA) 108 (90 Base) MCG/ACT inhaler INHALE 2 PUFFS INTO THE LUNGS EVERY 6  HOURS AS NEEDED FOR WHEEZING OR SHORTNESS OF BREATH    amLODipine (NORVASC) 5 MG tablet TAKE 1 TABLET(5 MG) BY MOUTH IN THE MORNING AND AT BEDTIME. HOLD EVENING DOSE IF BLOOD PRESSURE BELOW 115/75    apixaban (ELIQUIS) 5 MG TABS tablet TAKE 1 TABLET(5 MG) BY MOUTH TWICE DAILY    Calcium-Magnesium-Vitamin D (CALCIUM 1200+D3 PO) Take 1 tablet by mouth daily with breakfast. 1200 mg calcium / 1000 units Vitamin D3    diltiazem (CARDIZEM) 30 MG tablet Take 1 tablet (30 mg total) by mouth every 6 (six) hours as needed (for tachycardia/recurrent Afib.). 04/29/2021: Has not needed in >6 months   gabapentin (NEURONTIN) 300 MG capsule Take 300 mg by mouth daily.    HYDROcodone-acetaminophen (NORCO/VICODIN) 5-325 MG tablet Take 1 tablet by mouth every 4 (four) hours as needed for moderate pain.    meclizine (ANTIVERT) 25 MG tablet Take 25 mg by mouth 3 (three) times daily as needed (vertigo/dizziness).    predniSONE (DELTASONE) 10 MG tablet Take 1 tablet (10 mg total) by mouth as directed.    rosuvastatin (CRESTOR) 10 MG tablet Take 1 tablet (10 mg total) by mouth at bedtime.    No facility-administered encounter medications on file as of 05/20/2021.    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/24/2018   TIA (transient ischemic attack) 01/22/2018   Impingement syndrome  of shoulder region 01/02/2018   History of cataract surgery, right 09/01/2016   Paroxysmal atrial fibrillation ([place]) 02/24/2016   Vertigo 02/24/2016   Low HDL (under 40) 02/17/2016   Dizziness 02/15/2016   Hx of colonic polyps    Benign neoplasm of sigmoid colon    Asthma, mild intermittent 02/21/2015   Hyperglycemia 02/21/2015   Essential hypertension 02/20/2015   Inconclusive  mammogram due to dense breasts 01/02/2015   Dense breast 01/02/2015   Abnormal EKG 02/23/2013    Patient Care Plan: RN Care Management Plan of Care     Problem Identified: A-Fib, HTN, Diverticulitis, Chronic Joint Pain      Long-Range Goal: Disease Progression Prevented or Minimized   Start Date: 05/14/2021  Expected End Date: 08/12/2021  Priority: High  Note:   Current Barriers:  Chronic Disease Management support and education needs related to Atrial Fibrillation, HLD, and Chronic Joint Pain.  RNCM Clinical Goal(s):  Patient will continue to work with RN Care Manager and the care management team to address care management and care coordination needs related to  Atrial Fibrillation, HLD, and Chronic Joint Pain.  Interventions: 1:1 collaboration with primary care provider regarding development and update of comprehensive plan of care as evidenced by provider attestation and co-signature Inter-disciplinary care team collaboration (see longitudinal plan of care) Evaluation of current treatment plan related to  self management and patient's adherence to plan as established by provider.   Pain Interventions: Medications reviewed. Currently taking Extra Strength Tylenol as needed for joint pain. Reviewed provider established plan for pain management Discussed importance of adherence to all scheduled medical appointments. She is scheduled for follow up imaging r/t joint and back pain. Advised patient to report to care team affect of pain on daily activities. Reports currently completing ADL's and IADL's independently. Reports using an assistive device and following activity restrictions as advised. Discussed plan for follow up with the Ortho team. Reports recently being informed that the current knee pain is likely d/t misalignment and complications with her back. She will follow up for imaging tomorrow and discuss plan for treatment with the Ortho team.  Updated on 05/20/21: Patient  called regarding imaging. Reports being unable to reach Ortho staff. Requesting assistance with coordinating outreach. Per chart review, required imaging was complete. Contacted the [facility] Staff indicated that Dr. Marry Guan is currently out of the office. Informed that Ms. Cherney will be contacted on 05/25/21 to arrange a clinic visit and discuss imaging results. Informed Ms. Rockwell of plan. She agreed to contact the clinic if assistance is required prior to her Ortho follow-up.  AFIB Interventions:  Reviewed A-Fib plan. Reviewed increased risk of stroke due to A-Fib and importance of adherence to anticoagulant exactly as prescribed. Discussed importance of avoiding NSAIDs due to increased bleeding risk with anticoagulants. Advised to continue taking Tylenol as advised for joint pain. Discussed importance of regular laboratory monitoring as prescribed by the Cardiology team. Reviewed bleeding risk and importance of self-monitoring for signs/symptoms of bleeding. Reports significant bruising to her left thigh. She does not recall injuring the area and unable to recall specific events that may have caused the increased bruising. Notes that the bruising was noted after starting gabapentin. She was advised by Dr. Trena Platt team to discontinue gabapentin until she is evaluated. Reports the area is not swollen, painful or warm to touch. Denies urgent concerns but requesting to be evaluated in the clinic within the next week. Appointment scheduled for clinic visit on 05/18/21. Thoroughly reviewed worsening  symptoms that require immediate medical attention.  Patient Goals/Self-Care Activities: Patient will self administer medications as prescribed Patient will attend all scheduled provider appointments Patient will call pharmacy for medication refills Patient will continue to perform ADL's independently Patient will continue to perform IADL's independently Patient will call provider office for  new concerns or questions   Follow Up Plan:   Will follow up within the next month.      PLAN A member of the care management team will follow up next month.   [name] Care Management [facility] [phone]

## 2021-05-27 DIAGNOSIS — J4520 Mild intermittent asthma, uncomplicated: Secondary | ICD-10-CM

## 2021-05-27 DIAGNOSIS — I10 Essential (primary) hypertension: Secondary | ICD-10-CM

## 2021-05-27 DIAGNOSIS — M1711 Unilateral primary osteoarthritis, right knee: Secondary | ICD-10-CM | POA: Diagnosis not present

## 2021-05-27 DIAGNOSIS — I480 Paroxysmal atrial fibrillation: Secondary | ICD-10-CM | POA: Diagnosis not present

## 2021-05-27 DIAGNOSIS — E782 Mixed hyperlipidemia: Secondary | ICD-10-CM | POA: Diagnosis not present

## 2021-06-01 ENCOUNTER — Ambulatory Visit: Payer: Self-pay

## 2021-06-01 DIAGNOSIS — I480 Paroxysmal atrial fibrillation: Secondary | ICD-10-CM

## 2021-06-01 DIAGNOSIS — M48062 Spinal stenosis, lumbar region with neurogenic claudication: Secondary | ICD-10-CM

## 2021-06-01 DIAGNOSIS — Z9181 History of falling: Secondary | ICD-10-CM

## 2021-06-01 NOTE — Chronic Care Management (AMB) (Signed)
Chronic Care Management   CCM RN Visit Note  06/01/2021 Name: [name] MRN: [id number] DOB: January 02, 1941  Subjective: [name] is a 80 y.o. year old female who is a primary care patient of Steele Sizer, MD. The care management team was consulted for assistance with disease management and care coordination needs.    Engaged with patient by telephone for follow up visit in response to provider referral for case management and care coordination services.   Consent to Services:  The patient was given information about Chronic Care Management services, agreed to services, and gave verbal consent prior to initiation of services.  Please see initial visit note for detailed documentation.   Assessment: Review of patient past medical history, allergies, medications, health status, including review of consultants reports, laboratory and other test data, was performed as part of comprehensive evaluation and provision of chronic care management services.   SDOH (Social Determinants of Health) assessments and interventions performed:  No  CCM Care Plan  Allergies  Allergen Reactions   Ace Inhibitors Swelling    Patient states her tongue swell.   Penicillins Anxiety    Has patient had a PCN reaction causing immediate rash, facial/tongue/throat swelling, SOB or lightheadedness with hypotension: No Has patient had a PCN reaction causing severe rash involving mucus membranes or skin necrosis: No Has patient had a PCN reaction that required hospitalization No Has patient had a PCN reaction occurring within the last 10 years: No If all of the above answers are "NO", then may proceed with Cephalosporin use.    Outpatient Encounter Medications as of 06/01/2021  Medication Sig Note   acetaminophen (TYLENOL) 650 MG CR tablet Take 650 mg by mouth every 8 (eight) hours as needed for pain.    albuterol (VENTOLIN HFA) 108 (90 Base) MCG/ACT inhaler INHALE 2 PUFFS INTO THE LUNGS EVERY 6 HOURS  AS NEEDED FOR WHEEZING OR SHORTNESS OF BREATH    amLODipine (NORVASC) 5 MG tablet TAKE 1 TABLET(5 MG) BY MOUTH IN THE MORNING AND AT BEDTIME. HOLD EVENING DOSE IF BLOOD PRESSURE BELOW 115/75    apixaban (ELIQUIS) 5 MG TABS tablet TAKE 1 TABLET(5 MG) BY MOUTH TWICE DAILY    Calcium-Magnesium-Vitamin D (CALCIUM 1200+D3 PO) Take 1 tablet by mouth daily with breakfast. 1200 mg calcium / 1000 units Vitamin D3    diltiazem (CARDIZEM) 30 MG tablet Take 1 tablet (30 mg total) by mouth every 6 (six) hours as needed (for tachycardia/recurrent Afib.). 04/29/2021: Has not needed in >6 months   gabapentin (NEURONTIN) 300 MG capsule Take 300 mg by mouth daily.    HYDROcodone-acetaminophen (NORCO/VICODIN) 5-325 MG tablet Take 1 tablet by mouth every 4 (four) hours as needed for moderate pain.    meclizine (ANTIVERT) 25 MG tablet Take 25 mg by mouth 3 (three) times daily as needed (vertigo/dizziness).    predniSONE (DELTASONE) 10 MG tablet Take 1 tablet (10 mg total) by mouth as directed.    rosuvastatin (CRESTOR) 10 MG tablet Take 1 tablet (10 mg total) by mouth at bedtime.    No facility-administered encounter medications on file as of 06/01/2021.      Patient Care Plan: RN Care Management Plan of Care     Problem Identified: A-Fib, HTN, Diverticulitis, Chronic Joint Pain      Long-Range Goal: Disease Progression Prevented or Minimized   Start Date: 05/14/2021  Expected End Date: 08/12/2021  Priority: High  Note:   Current Barriers:  Chronic Disease Management support and education needs related to  Atrial Fibrillation, HLD, and Chronic Joint Pain.  RNCM Clinical Goal(s):  Patient will continue to work with RN Care Manager and the care management team to address care management and care coordination needs related to  Atrial Fibrillation, HLD, and Chronic Joint Pain.  Interventions: 1:1 collaboration with primary care provider regarding development and update of comprehensive plan of care as evidenced  by provider attestation and co-signature Inter-disciplinary care team collaboration (see longitudinal plan of care) Evaluation of current treatment plan related to  self management and patient's adherence to plan as established by provider   Fall Risk: Reviewed medications and discussed potential side effects. Provided information regarding safety and fall prevention. Reports currently using a cane or walker with all ambulation. Denies recent falls.  limited due to knee and back pain. Advised to avoid prolonged activity to prevent accident falls d/t exertion. Advised to complete follow up appointments with the Ortho team as scheduled. Discussed ability to perform ADL's. Reports completing ADL's independently. Currently lives alone and requires assistance with transportation and IADL's. Reports receiving good support from family and friends. Declines current need for additional assistance in the home. Agreed to update the care management team with changes in functional status.   Pain Interventions: Reviewed current plan for pain management. Reports completing joint imaging as ordered. Referral was placed for evaluation and joint injections with the [facility] team.  [facility] and confirmed appointment for initial injection within the next week. Patient informed that she will not be required to hold her blood thinner prior to the injection. Confirmed having needed transportation. Discussed need to contact her provider or seek medical attention if she notes significant changes with ability to ambulate or the pain worsens. Discussed importance of adherence to all scheduled medical appointments. She is scheduled for follow up imaging r/t joint and back pain.   Patient Goals/Self-Care Activities: Patient will self administer medications as prescribed Patient will attend all scheduled provider appointments Patient will call pharmacy for medication refills Patient will  continue to perform ADL's independently Patient will continue to perform IADL's independently Patient will call provider office for new concerns or questions   Follow Up Plan:   Will follow up within the next week.       PLAN Will follow up within the next week.   [name] Care Management [facility] [phone]

## 2021-06-01 NOTE — Patient Instructions (Signed)
Thank you for allowing the Chronic Care Management team to participate in your care.  

## 2021-06-03 DIAGNOSIS — M5416 Radiculopathy, lumbar region: Secondary | ICD-10-CM | POA: Diagnosis not present

## 2021-06-03 DIAGNOSIS — M48062 Spinal stenosis, lumbar region with neurogenic claudication: Secondary | ICD-10-CM | POA: Diagnosis not present

## 2021-06-04 ENCOUNTER — Ambulatory Visit: Payer: Medicare Other

## 2021-06-04 DIAGNOSIS — Z9181 History of falling: Secondary | ICD-10-CM

## 2021-06-04 DIAGNOSIS — I10 Essential (primary) hypertension: Secondary | ICD-10-CM

## 2021-06-04 DIAGNOSIS — M48062 Spinal stenosis, lumbar region with neurogenic claudication: Secondary | ICD-10-CM

## 2021-06-04 NOTE — Patient Instructions (Signed)
Thank you for allowing the Chronic Care Management team to participate in your care.  

## 2021-06-04 NOTE — Chronic Care Management (AMB) (Addendum)
Chronic Care Management   CCM RN Visit Note  06/04/2021 Name: SUSANNAH CARBIN MRN: [id number] DOB: [birth year]  Subjective: [name] is a 80 y.o. year old female who is a primary care patient of Steele Sizer, MD. The care management team was consulted for assistance with disease management and care coordination needs.    Engaged with patient by telephone for follow up visit in response to provider referral for case management and care coordination services.   Consent to Services:  The patient was given information about Chronic Care Management services, agreed to services, and gave verbal consent prior to initiation of services.  Please see initial visit note for detailed documentation.    Assessment: Review of patient past medical history, allergies, medications, health status, including review of consultants reports, laboratory and other test data, was performed as part of comprehensive evaluation and provision of chronic care management services.   SDOH (Social Determinants of Health) assessments and interventions performed: No   CCM Care Plan  Allergies  Allergen Reactions   Ace Inhibitors Swelling    Patient states her tongue swell.   Penicillins Anxiety    Has patient had a PCN reaction causing immediate rash, facial/tongue/throat swelling, SOB or lightheadedness with hypotension: No Has patient had a PCN reaction causing severe rash involving mucus membranes or skin necrosis: No Has patient had a PCN reaction that required hospitalization No Has patient had a PCN reaction occurring within the last 10 years: No If all of the above answers are "NO", then may proceed with Cephalosporin use.    Outpatient Encounter Medications as of 06/04/2021  Medication Sig Note   acetaminophen (TYLENOL) 650 MG CR tablet Take 650 mg by mouth every 8 (eight) hours as needed for pain.    albuterol (VENTOLIN HFA) 108 (90 Base) MCG/ACT inhaler INHALE 2 PUFFS INTO THE LUNGS EVERY 6  HOURS AS NEEDED FOR WHEEZING OR SHORTNESS OF BREATH    amLODipine (NORVASC) 5 MG tablet TAKE 1 TABLET(5 MG) BY MOUTH IN THE MORNING AND AT BEDTIME. HOLD EVENING DOSE IF BLOOD PRESSURE BELOW 115/75    apixaban (ELIQUIS) 5 MG TABS tablet TAKE 1 TABLET(5 MG) BY MOUTH TWICE DAILY    Calcium-Magnesium-Vitamin D (CALCIUM 1200+D3 PO) Take 1 tablet by mouth daily with breakfast. 1200 mg calcium / 1000 units Vitamin D3    diltiazem (CARDIZEM) 30 MG tablet Take 1 tablet (30 mg total) by mouth every 6 (six) hours as needed (for tachycardia/recurrent Afib.). 04/29/2021: Has not needed in >6 months   gabapentin (NEURONTIN) 300 MG capsule Take 300 mg by mouth daily.    HYDROcodone-acetaminophen (NORCO/VICODIN) 5-325 MG tablet Take 1 tablet by mouth every 4 (four) hours as needed for moderate pain.    meclizine (ANTIVERT) 25 MG tablet Take 25 mg by mouth 3 (three) times daily as needed (vertigo/dizziness).    predniSONE (DELTASONE) 10 MG tablet Take 1 tablet (10 mg total) by mouth as directed.    rosuvastatin (CRESTOR) 10 MG tablet Take 1 tablet (10 mg total) by mouth at bedtime.    No facility-administered encounter medications on file as of 06/04/2021.    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/24/2018   TIA (transient ischemic attack) 01/22/2018   Impingement  syndrome of shoulder region 01/02/2018   History of cataract surgery, right 09/01/2016   Paroxysmal atrial fibrillation ([place]) 02/24/2016   Vertigo 02/24/2016   Low HDL (under 40) 02/17/2016   Dizziness 02/15/2016   Hx of colonic polyps    Benign neoplasm of sigmoid colon    Asthma, mild intermittent 02/21/2015   Hyperglycemia 02/21/2015   Essential hypertension 02/20/2015   Inconclusive  mammogram due to dense breasts 01/02/2015   Dense breast 01/02/2015   Abnormal EKG 02/23/2013    Patient Care Plan: RN Care Management Plan of Care     Problem Identified: A-Fib, HTN, Diverticulitis, Chronic Joint Pain      Long-Range Goal: Disease Progression Prevented or Minimized   Start Date: 05/14/2021  Expected End Date: 08/12/2021  Priority: High  Note:   Current Barriers:  Chronic Disease Management support and education needs related to Atrial Fibrillation, HTN, HLD, and Chronic Joint Pain.  RNCM Clinical Goal(s):  Patient will continue to work with RN Care Manager and the care management team to address care management and care coordination needs related to  Atrial Fibrillation, HTN, HLD, and Chronic Joint Pain.  Interventions: 1:1 collaboration with primary care provider regarding development and update of comprehensive plan of care as evidenced by provider attestation and co-signature Inter-disciplinary care team collaboration (see longitudinal plan of care) Evaluation of current treatment plan related to  self management and patient's adherence to plan as established by provider   Fall Risk: Reviewed medications and discussed potential side effects. Provided information regarding safety and fall prevention. Reports currently using a cane or walker with all ambulation. Denies recent falls.  limited due to knee and back pain. Advised to avoid prolonged activity to prevent accident falls d/t exertion. Advised to complete follow up appointments with the Ortho team as scheduled. Discussed ability to perform ADL's. Reports completing ADL's independently. Currently lives alone and requires assistance with transportation and IADL's. Reports receiving good support from family and friends. Declines current need for additional assistance in the home. Agreed to update the care management team with changes in functional status.   Pain Interventions: Reviewed current plan for pain  management. Reports completing joint imaging as ordered. Referral was placed for evaluation and joint injections with the [place] team.  [facility] Phys[facility] and confirmed appointment for initial injection within the next week. Patient informed that she will not be required to hold her blood thinner prior to the injection. Confirmed having needed transportation. Discussed need to contact her provider or seek medical attention if she notes significant changes with ability to ambulate or the pain worsens. Discussed importance of adherence to all scheduled medical appointments. She is scheduled for follow up imaging r/t joint and back pain. Update on 06/04/21: Ms. Bango was contacted by the [facility] team. She was able to be evaluated earlier than anticipated. Reports significant pain relief after receiving the initial injection. Reports doing well today. She will follow up for her next injection on 06/24/21.  Hypertension Interventions:  BP Readings from Last 3 Encounters:  05/18/21 120/68  05/05/21 (!) 146/77  04/01/21 134/86    Reviewed medications.  Reports taking as prescribed. Reviewed established blood pressure parameters along with indications for notifying a provider. Reports systolic reading last night was 109. She is currently taking amlodipine 5 mg in the morning and in the evening. She was instructed to hold the evening dose for BP readings below 115/75. Reports holding the evening dose as advised. Encouraged to continue monitoring and recording  readings.  Discussed compliance with recommended cardiac prudent diet. Continues to do well with maintaining a cardiac prudent diet. Reviewed s/sx of heart attack, stroke and worsening symptoms that require immediate medical attention.   AFIB Interventions: (Stable: No interventions during this encounter)   Patient Goals/Self-Care Activities: Patient will self administer medications as prescribed Patient will  attend all scheduled provider appointments Patient will call pharmacy for medication refills Patient will continue to perform ADL's independently Patient will continue to perform IADL's independently Patient will call provider office for new concerns or questions        PLAN A member of the care management team will follow up next month.   [name] Care Management [facility] [phone]

## 2021-06-09 ENCOUNTER — Ambulatory Visit: Payer: Self-pay

## 2021-06-09 DIAGNOSIS — R634 Abnormal weight loss: Secondary | ICD-10-CM

## 2021-06-09 DIAGNOSIS — Z9181 History of falling: Secondary | ICD-10-CM

## 2021-06-09 DIAGNOSIS — I10 Essential (primary) hypertension: Secondary | ICD-10-CM

## 2021-06-09 NOTE — Chronic Care Management (AMB) (Signed)
Chronic Care Management   CCM RN Visit Note  06/09/2021 Name: [name] MRN: [id number] DOB: [birth year]  Subjective: [name] is a 80 y.o. year old female who is a primary care patient of Steele Sizer, MD. The care management team was consulted for assistance with disease management and care coordination needs.    Engaged with patient by telephone for follow up visit in response to provider referral for case management and care coordination services.   Consent to Services:  The patient was given information about Chronic Care Management services, agreed to services, and gave verbal consent prior to initiation of services.  Please see initial visit note for detailed documentation.    Assessment: Review of patient past medical history, allergies, medications, health status, including review of consultants reports, laboratory and other test data, was performed as part of comprehensive evaluation and provision of chronic care management services.   SDOH (Social Determinants of Health) assessments and interventions performed:  No  CCM Care Plan  Allergies  Allergen Reactions   Ace Inhibitors Swelling    Patient states her tongue swell.   Penicillins Anxiety    Has patient had a PCN reaction causing immediate rash, facial/tongue/throat swelling, SOB or lightheadedness with hypotension: No Has patient had a PCN reaction causing severe rash involving mucus membranes or skin necrosis: No Has patient had a PCN reaction that required hospitalization No Has patient had a PCN reaction occurring within the last 10 years: No If all of the above answers are "NO", then may proceed with Cephalosporin use.    Outpatient Encounter Medications as of 06/09/2021  Medication Sig Note   acetaminophen (TYLENOL) 650 MG CR tablet Take 650 mg by mouth every 8 (eight) hours as needed for pain.    albuterol (VENTOLIN HFA) 108 (90 Base) MCG/ACT inhaler INHALE 2 PUFFS INTO THE LUNGS EVERY 6  HOURS AS NEEDED FOR WHEEZING OR SHORTNESS OF BREATH    amLODipine (NORVASC) 5 MG tablet TAKE 1 TABLET(5 MG) BY MOUTH IN THE MORNING AND AT BEDTIME. HOLD EVENING DOSE IF BLOOD PRESSURE BELOW 115/75    apixaban (ELIQUIS) 5 MG TABS tablet TAKE 1 TABLET(5 MG) BY MOUTH TWICE DAILY    Calcium-Magnesium-Vitamin D (CALCIUM 1200+D3 PO) Take 1 tablet by mouth daily with breakfast. 1200 mg calcium / 1000 units Vitamin D3    diltiazem (CARDIZEM) 30 MG tablet Take 1 tablet (30 mg total) by mouth every 6 (six) hours as needed (for tachycardia/recurrent Afib.). 04/29/2021: Has not needed in >6 months   gabapentin (NEURONTIN) 300 MG capsule Take 300 mg by mouth daily.    HYDROcodone-acetaminophen (NORCO/VICODIN) 5-325 MG tablet Take 1 tablet by mouth every 4 (four) hours as needed for moderate pain.    meclizine (ANTIVERT) 25 MG tablet Take 25 mg by mouth 3 (three) times daily as needed (vertigo/dizziness).    predniSONE (DELTASONE) 10 MG tablet Take 1 tablet (10 mg total) by mouth as directed.    rosuvastatin (CRESTOR) 10 MG tablet Take 1 tablet (10 mg total) by mouth at bedtime.    No facility-administered encounter medications on file as of 06/09/2021.    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   TIA (transient ischemic attack) 01/22/2018   Impingement  syndrome of shoulder region 01/02/2018   History of cataract surgery, right 09/01/2016   Paroxysmal atrial fibrillation ([place]) 02/24/2016   Vertigo 02/24/2016   Low HDL (under 40) 02/17/2016   Dizziness 02/15/2016   Hx of colonic polyps    Benign neoplasm of sigmoid colon    Asthma, mild intermittent 02/21/2015   Hyperglycemia 02/21/2015   Essential hypertension 02/20/2015   Inconclusive  mammogram due to dense breasts 01/02/2015   Dense breast 01/02/2015   Abnormal EKG 02/23/2013    Patient Care Plan: RN Care Management Plan of Care     Problem Identified: A-Fib, HTN, Diverticulitis, Chronic Joint Pain      Long-Range Goal: Disease Progression Prevented or Minimized   Start Date: 05/14/2021  Expected End Date: 08/12/2021  Priority: High  Note:   Current Barriers:  Chronic Disease Management support and education needs related to Atrial Fibrillation, HTN, HLD, and Chronic Joint Pain.  RNCM Clinical Goal(s):  Patient will continue to work with RN Care Manager and the care management team to address care management and care coordination needs related to  Atrial Fibrillation, HTN, HLD, and Chronic Joint Pain.  Interventions: 1:1 collaboration with primary care provider regarding development and update of comprehensive plan of care as evidenced by provider attestation and co-signature Inter-disciplinary care team collaboration (see longitudinal plan of care) Evaluation of current treatment plan related to  self management and patient's adherence to plan as established by provider   Fall Risk: (06/09/21) No changes to interventions. Reviewed medications and discussed potential side effects. Provided information regarding safety and fall prevention. Reports currently using a cane or walker with all ambulation. Denies recent falls.  limited due to knee and back pain. Advised to avoid prolonged activity to prevent accident falls d/t exertion. Advised to complete follow up appointments with the Ortho team as scheduled. Discussed ability to perform ADL's. Reports completing ADL's independently. Currently lives alone and requires assistance with transportation and IADL's. Reports receiving good support from family and friends. Declines current need for additional assistance in the home. Agreed to update the care management team with changes in functional status.   Pain  Interventions: (06/09/21) No changes to interventions. Reviewed current plan for pain management. Reports completing joint imaging as ordered. Referral was placed for evaluation and joint injections with the [place] team.  [facility] Physiat[facility] and confirmed appointment for initial injection within the next week. Patient informed that she will not be required to hold her blood thinner prior to the injection. Confirmed having needed transportation. Discussed need to contact her provider or seek medical attention if she notes significant changes with ability to ambulate or the pain worsens. Discussed importance of adherence to all scheduled medical appointments. She is scheduled for follow up imaging r/t joint and back pain. Update on 06/04/21: Ms. Rohe was contacted by the [facility] team. She was able to be evaluated earlier than anticipated. Reports significant pain relief after receiving the initial injection. Reports doing well today. She will follow up for her next injection on 06/24/21.   Hypertension Interventions: (06/09/21) No changes to interventions. BP Readings from Last 3 Encounters:  05/18/21 120/68  05/05/21 (!) 146/77  04/01/21 134/86    Reviewed medications.  Reports taking as prescribed. Reviewed established blood pressure parameters along with indications for notifying a provider. Reports systolic reading last night was 109. She is currently taking amlodipine 5 mg in the morning and in the evening. She was instructed to hold the evening dose for BP readings  below 115/75. Reports holding the evening dose as advised. Encouraged to continue monitoring and recording readings.  Discussed compliance with recommended cardiac prudent diet. Continues to do well with maintaining a cardiac prudent diet. Reviewed s/sx of heart attack, stroke and worsening symptoms that require immediate medical attention.   Unintentional Weight Loss:(06/09/21: New Goal) Wt  Readings from Last 3 Encounters:  05/18/21 159 lb 3.2 oz (72.2 kg)  05/05/21 164 lb 0.4 oz (74.4 kg)  04/01/21 164 lb (74.4 kg)  Ms. Laffoon called requesting to notify her PCP of unintentional weight loss. Unable to recall timeframe that weight has decreased but expressed concerns that her average weight was approximately 180 lbs. Per chart review, she was evaluated/hospitalized for diverticulitis earlier this year. Recalls experiencing decreased appetite and intake during this time that may have contributed to weight loss. Also reports cooking less due to decreased activity tolerance.  Discussed current nutritional intake. Reports a good appetite and tolerating meals without difficulty. Discussed option to include supplements such as Boost or Ensure. Denies urgent or concerning symptoms r/t decreased weight. Would like to discuss with Dr. Ancil Boozer. Agreeable to virtual visit on 06/10/21   Patient Goals/Self-Care Activities: Patient will self administer medications as prescribed Patient will attend all scheduled provider appointments Patient will call pharmacy for medication refills Patient will continue to perform ADL's independently Patient will continue to perform IADL's independently Patient will call provider office for new concerns or questions   PLAN Will follow up next month        PLAN Ms. Willingham will complete a virtual visit with Dr. Ancil Boozer on 06/10/21. A member of the care management team will follow up with Ms. Rossi next month.   [name] Care Management [facility] [phone]

## 2021-06-10 ENCOUNTER — Encounter: Payer: Self-pay | Admitting: Family Medicine

## 2021-06-10 ENCOUNTER — Ambulatory Visit (INDEPENDENT_AMBULATORY_CARE_PROVIDER_SITE_OTHER): Payer: Medicare Other | Admitting: Family Medicine

## 2021-06-10 ENCOUNTER — Other Ambulatory Visit: Payer: Self-pay

## 2021-06-10 DIAGNOSIS — R634 Abnormal weight loss: Secondary | ICD-10-CM | POA: Diagnosis not present

## 2021-06-10 DIAGNOSIS — E441 Mild protein-calorie malnutrition: Secondary | ICD-10-CM | POA: Diagnosis not present

## 2021-06-10 DIAGNOSIS — D72828 Other elevated white blood cell count: Secondary | ICD-10-CM

## 2021-06-10 NOTE — Progress Notes (Signed)
Name: [name]   MRN: [id number]    DOB: Feb 05, 1941   Date:06/10/2021       Progress Note  Subjective  Chief Complaint  Acute visit for unintentional weight loss concerns  I connected with  [name] on 06/10/21 at 11:40 AM EST by telephone and verified that I am speaking with the correct person using two identifiers.  I discussed the limitations, risks, security and privacy concerns of performing an evaluation and management service by telephone and the availability of in person appointments. Staff also discussed with the patient that there may be a patient responsible charge related to this service. Patient agreed on having a virtual visit  Patient Location: at home  Provider Location: [facility] Additional Individuals present: alone   HPI  Malnutrition: she has lost 20 lbs in the past year, she called today because she is still losing weight even though she is drink Boost , trying to increase calorie intake. She has been in a lot of pain, had right knee replacement and revision in April, now having back pain with radiculitis down left leg. She is not feeling well , had an epidural injection recently but still not comfortable. She states stressed due to pain.  Appetite ir normal, no fever, chills,chest pain, headaches or change in bowel movements  Patient Active Problem List   Diagnosis Date Noted   Pain of left lower extremity 05/10/2021   Osteoarthritis of right knee 05/05/2020   History of colonic polyps    Polyp of transverse colon    Aneurysm ([place]) 02/08/2018   History of CVA (cerebrovascular accident) without residual deficits 01/24/2018   Aneurysm of ophthalmic artery 01/24/2018   TIA (transient ischemic attack) 01/22/2018   Impingement syndrome of shoulder region 01/02/2018   History of cataract surgery, right 09/01/2016   Paroxysmal atrial fibrillation ([place]) 02/24/2016   Vertigo 02/24/2016   Low HDL (under 40) 02/17/2016   Dizziness 02/15/2016   Hx of  colonic polyps    Benign neoplasm of sigmoid colon    Asthma, mild intermittent 02/21/2015   Hyperglycemia 02/21/2015   Essential hypertension 02/20/2015   Inconclusive mammogram due to dense breasts 01/02/2015   Dense breast 01/02/2015   Abnormal EKG 02/23/2013    Past Surgical History:  Procedure Laterality Date   ABDOMINAL HYSTERECTOMY     CATARACT EXTRACTION Right 08/31/2016   COLONOSCOPY WITH PROPOFOL N/A 03/04/2015   Procedure: COLONOSCOPY WITH PROPOFOL;  Surgeon: Lucilla Lame, MD;  Location: [facility] ENDOSCOPY;  Service: Endoscopy;  Laterality: N/A;   COLONOSCOPY WITH PROPOFOL N/A 04/01/2020   Procedure: COLONOSCOPY WITH PROPOFOL;  Surgeon: Lucilla Lame, MD;  Location: [facility] ENDOSCOPY;  Service: Endoscopy;  Laterality: N/A;   DIAGNOSTIC LAPAROSCOPY     REMOVAL OF SCAR TISSUE (ADHESIONS)   KNEE ARTHROSCOPY Right 10/08/2020   Procedure: ARTHROSCOPY KNEE;  Surgeon: Dereck Leep, MD;  Location: [facility] ORS;  Service: Orthopedics;  Laterality: Right;   KNEE ARTHROSCOPY Right 10/21/2020   Procedure: Arthroscopic irrigation and debridement right knee;  Surgeon: Dereck Leep, MD;  Location: [facility] ORS;  Service: Orthopedics;  Laterality: Right;    Family History  Problem Relation Age of Onset   COPD Father    Hypertension Mother    Aneurysm Mother    Hypertension Brother    Stroke Brother    Cancer Brother        pancreatic   Healthy Brother     Social History   Socioeconomic History   Marital status: Single  Spouse name: Not on file   Number of children: 2   Years of education: Not on file   Highest education level: Associate degree: academic program  Occupational History   Occupation: Retired  Tobacco Use   Smoking status: Never   Smokeless tobacco: Never   Tobacco comments:    smoking cessation materials not required  Vaping Use   Vaping Use: Never used  Substance and Sexual Activity   Alcohol use: No    Alcohol/week: 0.0 standard drinks   Drug use: No   Sexual  activity: Not Currently    Partners: Male  Other Topics Concern   Not on file  Social History Narrative   Independent at baseline. Lives alone.    Social Determinants of Health   Financial Resource Strain: Medium Risk   Difficulty of Paying Living Expenses: Somewhat hard  Food Insecurity: No Food Insecurity   Worried About Charity fundraiser in the Last Year: Never true   Ran Out of Food in the Last Year: Never true  Transportation Needs: No Transportation Needs   Lack of Transportation (Medical): No   Lack of Transportation (Non-Medical): No  Physical Activity: Insufficiently Active   Days of Exercise per Week: 2 days   Minutes of Exercise per Session: 20 min  Stress: No Stress Concern Present   Feeling of Stress : Not at all  Social Connections: Moderately Integrated   Frequency of Communication with Friends and Family: More than three times a week   Frequency of Social Gatherings with Friends and Family: More than three times a week   Attends Religious Services: More than 4 times per year   Active Member of [employer] or Organizations: Yes   Attends Music therapist: More than 4 times per year   Marital Status: Never married  Human resources officer Violence: Not At Risk   Fear of Current or Ex-Partner: No   Emotionally Abused: No   Physically Abused: No   Sexually Abused: No     Current Outpatient Medications:    acetaminophen (TYLENOL) 650 MG CR tablet, Take 650 mg by mouth every 8 (eight) hours as needed for pain., Disp: , Rfl:    albuterol (VENTOLIN HFA) 108 (90 Base) MCG/ACT inhaler, INHALE 2 PUFFS INTO THE LUNGS EVERY 6 HOURS AS NEEDED FOR WHEEZING OR SHORTNESS OF BREATH, Disp: 6.7 g, Rfl: 0   amLODipine (NORVASC) 5 MG tablet, TAKE 1 TABLET(5 MG) BY MOUTH IN THE MORNING AND AT BEDTIME. HOLD EVENING DOSE IF BLOOD PRESSURE BELOW 115/75, Disp: 180 tablet, Rfl: 0   apixaban (ELIQUIS) 5 MG TABS tablet, TAKE 1 TABLET(5 MG) BY MOUTH TWICE DAILY, Disp: 60 tablet, Rfl: 6    Calcium-Magnesium-Vitamin D (CALCIUM 1200+D3 PO), Take 1 tablet by mouth daily with breakfast. 1200 mg calcium / 1000 units Vitamin D3, Disp: , Rfl:    diltiazem (CARDIZEM) 30 MG tablet, Take 1 tablet (30 mg total) by mouth every 6 (six) hours as needed (for tachycardia/recurrent Afib.)., Disp: 30 tablet, Rfl: 3   meclizine (ANTIVERT) 25 MG tablet, Take 25 mg by mouth 3 (three) times daily as needed (vertigo/dizziness)., Disp: , Rfl:    rosuvastatin (CRESTOR) 10 MG tablet, Take 1 tablet (10 mg total) by mouth at bedtime., Disp: 90 tablet, Rfl: 1   tiZANidine (ZANAFLEX) 2 MG tablet, Take 2 mg by mouth 3 (three) times daily., Disp: , Rfl:   Allergies  Allergen Reactions   Ace Inhibitors Swelling    Patient states her tongue swell.  Penicillins Anxiety    Has patient had a PCN reaction causing immediate rash, facial/tongue/throat swelling, SOB or lightheadedness with hypotension: No Has patient had a PCN reaction causing severe rash involving mucus membranes or skin necrosis: No Has patient had a PCN reaction that required hospitalization No Has patient had a PCN reaction occurring within the last 10 years: No If all of the above answers are "NO", then may proceed with Cephalosporin use.    I personally reviewed active problem list, medication list, allergies with the patient/caregiver today.   ROS  Ten systems reviewed and is negative except as mentioned in HPI   Objective  Virtual encounter, vitals not obtained.  There is no height or weight on file to calculate BMI.  Physical Exam  Awake, alert and oriented   PHQ2/9: Depression screen [facility] 2/9 06/10/2021 05/18/2021 04/01/2021 02/03/2021 08/25/2020  Decreased Interest 0 0 0 0 0  Down, Depressed, Hopeless 0 0 0 0 0  PHQ - 2 Score 0 0 0 0 0  Altered sleeping 0 0 - - -  Tired, decreased energy 0 0 - - -  Change in appetite 0 0 - - -  Feeling bad or failure about yourself  0 0 - - -  Trouble concentrating 0 0 - - -  Moving slowly  or fidgety/restless 0 0 - - -  Suicidal thoughts 0 0 - - -  PHQ-9 Score 0 0 - - -  Difficult doing work/chores Not difficult at all Not difficult at all - - -  Some recent data might be hidden   PHQ-2/9 Result is negative.    Fall Risk: Fall Risk  06/10/2021 05/20/2021 05/18/2021 04/01/2021 02/03/2021  Falls in the past year? 0 0 0 0 0  Number falls in past yr: 0 0 0 0 0  Injury with Fall? 0 - 0 0 0  Risk for fall due to : No Fall Risks Impaired balance/gait;Impaired mobility Impaired balance/gait;Impaired mobility Impaired balance/gait Impaired balance/gait;Orthopedic patient  Risk for fall due to: Comment - - - - -  Follow up Falls prevention discussed Falls prevention discussed - Falls prevention discussed Falls prevention discussed     Assessment & Plan  1. Mild protein-calorie malnutrition (HCC)  - TSH - CBC with Differential/Platelet - COMPLETE METABOLIC PANEL WITH GFR - Sed Rate (ESR) - C-reactive protein  2. Weight loss, unintentional  - TSH - CBC with Differential/Platelet - COMPLETE METABOLIC PANEL WITH GFR - Sed Rate (ESR) - C-reactive protein  3. Other elevated white blood cell (WBC) count  - CBC with Differential/Platelet   I discussed the assessment and treatment plan with the patient. The patient was provided an opportunity to ask questions and all were answered. The patient agreed with the plan and demonstrated an understanding of the instructions.   The patient was advised to call back or seek an in-person evaluation if the symptoms worsen or if the condition fails to improve as anticipated.  I provided 20 minutes of non-face-to-face time during this encounter.  Loistine Chance, MD

## 2021-06-11 DIAGNOSIS — D72828 Other elevated white blood cell count: Secondary | ICD-10-CM | POA: Diagnosis not present

## 2021-06-11 DIAGNOSIS — R634 Abnormal weight loss: Secondary | ICD-10-CM | POA: Diagnosis not present

## 2021-06-11 DIAGNOSIS — E441 Mild protein-calorie malnutrition: Secondary | ICD-10-CM | POA: Diagnosis not present

## 2021-06-12 ENCOUNTER — Ambulatory Visit (INDEPENDENT_AMBULATORY_CARE_PROVIDER_SITE_OTHER): Payer: Medicare Other

## 2021-06-12 DIAGNOSIS — E441 Mild protein-calorie malnutrition: Secondary | ICD-10-CM

## 2021-06-12 DIAGNOSIS — M48062 Spinal stenosis, lumbar region with neurogenic claudication: Secondary | ICD-10-CM

## 2021-06-12 DIAGNOSIS — E782 Mixed hyperlipidemia: Secondary | ICD-10-CM

## 2021-06-12 LAB — COMPLETE METABOLIC PANEL WITHOUT GFR
AG Ratio: 1.3 (calc) (ref 1.0–2.5)
ALT: 20 U/L (ref 6–29)
AST: 16 U/L (ref 10–35)
Albumin: 4.1 g/dL (ref 3.6–5.1)
Alkaline phosphatase (APISO): 55 U/L (ref 37–153)
BUN: 14 mg/dL (ref 7–25)
CO2: 27 mmol/L (ref 20–32)
Calcium: 9.9 mg/dL (ref 8.6–10.4)
Chloride: 106 mmol/L (ref 98–110)
Creat: 0.82 mg/dL (ref 0.60–1.00)
Globulin: 3.2 g/dL (ref 1.9–3.7)
Glucose, Bld: 106 mg/dL — ABNORMAL HIGH (ref 65–99)
Potassium: 3.9 mmol/L (ref 3.5–5.3)
Sodium: 140 mmol/L (ref 135–146)
Total Bilirubin: 0.5 mg/dL (ref 0.2–1.2)
Total Protein: 7.3 g/dL (ref 6.1–8.1)
eGFR: 73 mL/min/{1.73_m2}

## 2021-06-12 LAB — CBC WITH DIFFERENTIAL/PLATELET
Absolute Monocytes: 591 {cells}/uL (ref 200–950)
Basophils Absolute: 29 {cells}/uL (ref 0–200)
Basophils Relative: 0.4 %
Eosinophils Absolute: 88 {cells}/uL (ref 15–500)
Eosinophils Relative: 1.2 %
HCT: 42.5 % (ref 35.0–45.0)
Hemoglobin: 14.3 g/dL (ref 11.7–15.5)
Lymphs Abs: 1898 {cells}/uL (ref 850–3900)
MCH: 31.3 pg (ref 27.0–33.0)
MCHC: 33.6 g/dL (ref 32.0–36.0)
MCV: 93 fL (ref 80.0–100.0)
MPV: 12.5 fL (ref 7.5–12.5)
Monocytes Relative: 8.1 %
Neutro Abs: 4694 {cells}/uL (ref 1500–7800)
Neutrophils Relative %: 64.3 %
Platelets: 406 10*3/uL — ABNORMAL HIGH (ref 140–400)
RBC: 4.57 Million/uL (ref 3.80–5.10)
RDW: 13.2 % (ref 11.0–15.0)
Total Lymphocyte: 26 %
WBC: 7.3 10*3/uL (ref 3.8–10.8)

## 2021-06-12 LAB — SEDIMENTATION RATE: Sed Rate: 63 mm/h — ABNORMAL HIGH (ref 0–30)

## 2021-06-12 LAB — TSH: TSH: 2.24 m[IU]/L (ref 0.40–4.50)

## 2021-06-12 LAB — C-REACTIVE PROTEIN: CRP: 1 mg/L

## 2021-06-12 NOTE — Chronic Care Management (AMB) (Signed)
Chronic Care Management   CCM RN Visit Note  06/12/2021 Name: [name] MRN: [id number] DOB: June 01, 1941  Subjective: [name] is a 80 y.o. year old female who is a primary care patient of Steele Sizer, MD. The care management team was consulted for assistance with disease management and care coordination needs.    Engaged with patient by telephone for follow up visit in response to provider referral for case management and care coordination services.   Consent to Services:  The patient was given information about Chronic Care Management services, agreed to services, and gave verbal consent prior to initiation of services.  Please see initial visit note for detailed documentation.   Patient agreed to services and verbal consent obtained.   Assessment: Review of patient past medical history, allergies, medications, health status, including review of consultants reports, laboratory and other test data, was performed as part of comprehensive evaluation and provision of chronic care management services.   SDOH (Social Determinants of Health) assessments and interventions performed: No  CCM Care Plan  Allergies  Allergen Reactions   Ace Inhibitors Swelling    Patient states her tongue swell.   Penicillins Anxiety    Has patient had a PCN reaction causing immediate rash, facial/tongue/throat swelling, SOB or lightheadedness with hypotension: No Has patient had a PCN reaction causing severe rash involving mucus membranes or skin necrosis: No Has patient had a PCN reaction that required hospitalization No Has patient had a PCN reaction occurring within the last 10 years: No If all of the above answers are "NO", then may proceed with Cephalosporin use.    Outpatient Encounter Medications as of 06/12/2021  Medication Sig Note   acetaminophen (TYLENOL) 650 MG CR tablet Take 650 mg by mouth every 8 (eight) hours as needed for pain.    albuterol (VENTOLIN HFA) 108 (90  Base) MCG/ACT inhaler INHALE 2 PUFFS INTO THE LUNGS EVERY 6 HOURS AS NEEDED FOR WHEEZING OR SHORTNESS OF BREATH    amLODipine (NORVASC) 5 MG tablet TAKE 1 TABLET(5 MG) BY MOUTH IN THE MORNING AND AT BEDTIME. HOLD EVENING DOSE IF BLOOD PRESSURE BELOW 115/75    apixaban (ELIQUIS) 5 MG TABS tablet TAKE 1 TABLET(5 MG) BY MOUTH TWICE DAILY    Calcium-Magnesium-Vitamin D (CALCIUM 1200+D3 PO) Take 1 tablet by mouth daily with breakfast. 1200 mg calcium / 1000 units Vitamin D3    diltiazem (CARDIZEM) 30 MG tablet Take 1 tablet (30 mg total) by mouth every 6 (six) hours as needed (for tachycardia/recurrent Afib.). 04/29/2021: Has not needed in >6 months   meclizine (ANTIVERT) 25 MG tablet Take 25 mg by mouth 3 (three) times daily as needed (vertigo/dizziness).    rosuvastatin (CRESTOR) 10 MG tablet Take 1 tablet (10 mg total) by mouth at bedtime.    tiZANidine (ZANAFLEX) 2 MG tablet Take 2 mg by mouth 3 (three) times daily.    No facility-administered encounter medications on file as of 06/12/2021.    Patient Active Problem List   Diagnosis Date Noted   Pain of left lower extremity 05/10/2021   Osteoarthritis of right knee 05/05/2020   History of colonic polyps    Polyp of transverse colon    Aneurysm ([place]) 02/08/2018   History of CVA (cerebrovascular accident) without residual deficits 01/24/2018   Aneurysm of ophthalmic artery 01/24/2018   TIA (transient ischemic attack) 01/22/2018   Impingement syndrome of shoulder region 01/02/2018   History of cataract surgery, right 09/01/2016   Paroxysmal atrial fibrillation ([place]) 02/24/2016  Vertigo 02/24/2016   Low HDL (under 40) 02/17/2016   Dizziness 02/15/2016   Hx of colonic polyps    Benign neoplasm of sigmoid colon    Asthma, mild intermittent 02/21/2015   Hyperglycemia 02/21/2015   Essential hypertension 02/20/2015   Inconclusive mammogram due to dense breasts 01/02/2015   Dense breast 01/02/2015   Abnormal EKG 02/23/2013     Patient  Care Plan: RN Care Management Plan of Care     Problem Identified: A-Fib, HTN, Diverticulitis, Chronic Joint Pain      Long-Range Goal: Disease Progression Prevented or Minimized   Start Date: 05/14/2021  Expected End Date: 08/12/2021  Priority: High  Note:   Current Barriers:  Chronic Disease Management support and education needs related to Atrial Fibrillation, HTN, HLD, and Chronic Joint Pain.  RNCM Clinical Goal(s):  Patient will continue to work with RN Care Manager and the care management team to address care management and care coordination needs related to  Atrial Fibrillation, HTN, HLD, and Chronic Joint Pain.  Interventions: 1:1 collaboration with primary care provider regarding development and update of comprehensive plan of care as evidenced by provider attestation and co-signature Inter-disciplinary care team collaboration (see longitudinal plan of care) Evaluation of current treatment plan related to  self management and patient's adherence to plan as established by provider   Fall Risk: Reviewed information regarding safety and fall prevention.  Advised to continue using a cane or walker with all ambulation. Advised to follow up with the Ortho team to discuss plan for surgery. Discussed ability to perform ADL's. Continues to complete ADL's independently. Family and friends remain available to assist as needed.  Declines current need for additional assistance in the home. Agreed to update the care management team with changes in functional status.   Pain Interventions:  Reviewed current plan for pain management. Reports completing joint imaging as ordered. Referral was placed for evaluation and joint injections with the [facility] team.  [facility] and confirmed appointment for initial injection within the next week. Patient informed that she will not be required to hold her blood thinner prior to the injection. Confirmed having needed  transportation. Discussed need to contact her provider or seek medical attention if she notes significant changes with ability to ambulate or the pain worsens. Discussed importance of adherence to all scheduled medical appointments. She is scheduled for follow up imaging r/t joint and back pain. Update on 06/12/21: Discussed plan to follow up with the [facility] team on 06/24/21. Confirmed that she will not receive an injection during that appointment but will discuss long plan for activity restrictions ad ongoing pain management. She verbalized awareness of need to seek medical attention if her symptoms worsen or if she experiences decline in ability to ambulate.   Hypertension Interventions: (06/09/21) No changes to interventions. BP Readings from Last 3 Encounters:  05/18/21 120/68  05/05/21 (!) 146/77  04/01/21 134/86    Reviewed medications.  Reports taking as prescribed. Reviewed established blood pressure parameters along with indications for notifying a provider. Reports systolic reading last night was 109. She is currently taking amlodipine 5 mg in the morning and in the evening. She was instructed to hold the evening dose for BP readings below 115/75. Reports holding the evening dose as advised. Encouraged to continue monitoring and recording readings.  Discussed compliance with recommended cardiac prudent diet. Continues to do well with maintaining a cardiac prudent diet. Reviewed s/sx of heart attack, stroke and worsening symptoms that require immediate medical  attention.   Unintentional Weight Loss: Wt Readings from Last 3 Encounters:  05/18/21 159 lb 3.2 oz (72.2 kg)  05/05/21 164 lb 0.4 oz (74.4 kg)  04/01/21 164 lb (74.4 kg)  Discussed plan regarding unintentional weight loss. Completed outreach with PCP as scheduled. Reports she is currently attempting to consume Boost. Discussed additional options to increase protein. She will attempt to consume Boost at least once a  day between meals. Will consider using Ensure or Premier Protein shakes if she is unable to obtain Boost.  Thorough discussion regarding need to consume nutrient rich small meals if she is unable to tolerate regular servings. Discussed need to continue reading nutritional labels to ensure meals comply with the recommended heart healthy/cardiac prudent diet. Discussed current appetite. Reports good appetite over the past few days. She will notify Dr. Ancil Boozer if this changes. Discussed need to continue monitoring weight. Advised to monitor and maintain a log. Reviewed pending appointments. Advised to follow up outreach with PCP as scheduled on 07/22/21.   Patient Goals/Self-Care Activities: Patient will self administer medications as prescribed Patient will attend all scheduled provider appointments Patient will call pharmacy for medication refills Patient will continue to perform ADL's independently Patient will continue to perform IADL's independently Patient will call provider office for new concerns or questions   PLAN Will follow up next month      PLAN A member of the care management team will follow up within the next month.   [name] Care Management [facility] [phone]

## 2021-06-24 DIAGNOSIS — M25562 Pain in left knee: Secondary | ICD-10-CM | POA: Diagnosis not present

## 2021-06-24 DIAGNOSIS — M48062 Spinal stenosis, lumbar region with neurogenic claudication: Secondary | ICD-10-CM | POA: Diagnosis not present

## 2021-06-24 DIAGNOSIS — G8929 Other chronic pain: Secondary | ICD-10-CM | POA: Diagnosis not present

## 2021-06-24 DIAGNOSIS — M5416 Radiculopathy, lumbar region: Secondary | ICD-10-CM | POA: Diagnosis not present

## 2021-06-27 DIAGNOSIS — E782 Mixed hyperlipidemia: Secondary | ICD-10-CM

## 2021-07-07 ENCOUNTER — Other Ambulatory Visit: Payer: Self-pay | Admitting: Family Medicine

## 2021-07-07 DIAGNOSIS — I10 Essential (primary) hypertension: Secondary | ICD-10-CM

## 2021-07-07 DIAGNOSIS — E785 Hyperlipidemia, unspecified: Secondary | ICD-10-CM

## 2021-07-07 DIAGNOSIS — Z8673 Personal history of transient ischemic attack (TIA), and cerebral infarction without residual deficits: Secondary | ICD-10-CM

## 2021-07-09 ENCOUNTER — Telehealth: Payer: Self-pay | Admitting: Family Medicine

## 2021-07-09 DIAGNOSIS — I10 Essential (primary) hypertension: Secondary | ICD-10-CM

## 2021-07-21 NOTE — Progress Notes (Signed)
Name: [name]   MRN: [id number]    DOB: [birth year]   Date:07/22/2021       Progress Note  Subjective  Chief Complaint  Follow Up  HPI  HTN: bp is at goal on Norvasc 5 mg up to BID , no chest pain , seldom has palpitation but no SOB . BP is towards low end of normal, we will try going down on dose of norvasc to 5 mg in am and 2.5 mg in pm if bp above 115/75 at night    Pre-diabetes: last dropped from 6.1 % to 5.7 %  Denies polyphagia, polydipsia or polyuria .   Paroxysmal Afib: sees Dr. Fletcher Anon, symptoms started August 2017, she is on Eliquis daily  and prn Cardizem. She is compliant with medications     History of CVA/TIA and also aneursym of left para ophthalmic artery , under the care of Dr. Manuella Ghazi - neurologist She is now taking Crestor and denies side effects.. She has occasional right frontal headache and soreness on right side of neck but has been doing well lately    MR angio head without contrast from 01/23/2020 compared with previous one done  09/08/2018:   IMPRESSION: Stable appearance of 3 mm left paraophthalmic ICA aneurysm.   No new aneurysm or high-grade narrowing.   Asthma: she states symptoms are controlled a this time. Not on medications   OSA: seen by Dr. Manuella Ghazi - neurologist , unable to tolerate CPAP, sleep study done 06/30/2018 , she was supposed to have an oral appliance., unable to replaced it due to cost    Vertigo: she has vertigo with head movement, motion sickness, and when she has an episode she needs to go to [facility] by EMS because of severe nausea, vomiting and diarrhea 06/2016. She had Summer of 2020 but resolved by itself . She states promethazine is more helpful than Meclizine prn. Episodes are sporadic, still happens when she rolls in bed at time, discussed referral to ENT , she states she has been there and do her home exercises prn   Insomnia: she has Trazodone but has not been taking it, she falls asleep but wakes up around 1 am and has difficulty  falling back asleep. Discussed a love and kindness meditation to help her fall asleep but also advised to take trazodone to see if helps with her sleep  Malnutrition: : she started a diet   Fall 2021, and lost 13 lbs in 6 months, however she stopped diet and continue to lose weight, she has lost 10 lbs since 06/2020. Her weight is not dropping as fast but lost 3 lbs since lat visit 3 months ago. She eats healthy, fish, chicken, she has been drinking Boost with one of her meals. We check multiple labs 05/2021 , negative CRP, normal comp panel, no anemia, white count back to normal. Had slight elevated of platelets and also sed rage that had improved down from 80's to 60   Bilateral knee pain: she is scheduled to have right knee replacement in Feb 2023   Patient Active Problem List   Diagnosis Date Noted   Pain of left lower extremity 05/10/2021   Osteoarthritis of right knee 05/05/2020   History of colonic polyps    Polyp of transverse colon    Aneurysm ([place]) 02/08/2018   History of CVA (cerebrovascular accident) without residual deficits 01/24/2018   Aneurysm of ophthalmic artery 01/24/2018   TIA (transient ischemic attack) 01/22/2018   Impingement syndrome of shoulder region  01/02/2018   History of cataract surgery, right 09/01/2016   Paroxysmal atrial fibrillation ([place]) 02/24/2016   Vertigo 02/24/2016   Low HDL (under 40) 02/17/2016   Dizziness 02/15/2016   Hx of colonic polyps    Benign neoplasm of sigmoid colon    Asthma, mild intermittent 02/21/2015   Hyperglycemia 02/21/2015   Essential hypertension 02/20/2015   Inconclusive mammogram due to dense breasts 01/02/2015   Dense breast 01/02/2015   Abnormal EKG 02/23/2013    Past Surgical History:  Procedure Laterality Date   ABDOMINAL HYSTERECTOMY     CATARACT EXTRACTION Right 08/31/2016   COLONOSCOPY WITH PROPOFOL N/A 03/04/2015   Procedure: COLONOSCOPY WITH PROPOFOL;  Surgeon: Lucilla Lame, MD;  Location: [facility] ENDOSCOPY;   Service: Endoscopy;  Laterality: N/A;   COLONOSCOPY WITH PROPOFOL N/A 04/01/2020   Procedure: COLONOSCOPY WITH PROPOFOL;  Surgeon: Lucilla Lame, MD;  Location: [facility] ENDOSCOPY;  Service: Endoscopy;  Laterality: N/A;   DIAGNOSTIC LAPAROSCOPY     REMOVAL OF SCAR TISSUE (ADHESIONS)   KNEE ARTHROSCOPY Right 10/08/2020   Procedure: ARTHROSCOPY KNEE;  Surgeon: Dereck Leep, MD;  Location: [facility] ORS;  Service: Orthopedics;  Laterality: Right;   KNEE ARTHROSCOPY Right 10/21/2020   Procedure: Arthroscopic irrigation and debridement right knee;  Surgeon: Dereck Leep, MD;  Location: [facility] ORS;  Service: Orthopedics;  Laterality: Right;    Family History  Problem Relation Age of Onset   COPD Father    Hypertension Mother    Aneurysm Mother    Hypertension Brother    Stroke Brother    Cancer Brother        pancreatic   Healthy Brother     Social History   Tobacco Use   Smoking status: Never   Smokeless tobacco: Never   Tobacco comments:    smoking cessation materials not required  Substance Use Topics   Alcohol use: No    Alcohol/week: 0.0 standard drinks     Current Outpatient Medications:    acetaminophen (TYLENOL) 650 MG CR tablet, Take 650 mg by mouth every 8 (eight) hours as needed for pain., Disp: , Rfl:    albuterol (VENTOLIN HFA) 108 (90 Base) MCG/ACT inhaler, INHALE 2 PUFFS INTO THE LUNGS EVERY 6 HOURS AS NEEDED FOR WHEEZING OR SHORTNESS OF BREATH, Disp: 6.7 g, Rfl: 0   amLODipine (NORVASC) 5 MG tablet, TAKE 1 TABLET BY MOUTH EVERY MORNING AND 1 TABLET AT BEDTIME. HOLD EVENING DOSE IF BLOOD PRESSURE IS BELOW 115/75, Disp: 180 tablet, Rfl: 0   apixaban (ELIQUIS) 5 MG TABS tablet, TAKE 1 TABLET(5 MG) BY MOUTH TWICE DAILY, Disp: 60 tablet, Rfl: 6   Calcium-Magnesium-Vitamin D (CALCIUM 1200+D3 PO), Take 1 tablet by mouth daily with breakfast. 1200 mg calcium / 1000 units Vitamin D3, Disp: , Rfl:    diltiazem (CARDIZEM) 30 MG tablet, Take 1 tablet (30 mg total) by mouth every 6  (six) hours as needed (for tachycardia/recurrent Afib.)., Disp: 30 tablet, Rfl: 3   meclizine (ANTIVERT) 25 MG tablet, Take 25 mg by mouth 3 (three) times daily as needed (vertigo/dizziness)., Disp: , Rfl:    rosuvastatin (CRESTOR) 10 MG tablet, TAKE 1 TABLET(10 MG) BY MOUTH DAILY, Disp: 90 tablet, Rfl: 1   tiZANidine (ZANAFLEX) 2 MG tablet, Take 2 mg by mouth 3 (three) times daily. (Patient not taking: Reported on 07/22/2021), Disp: , Rfl:   Allergies  Allergen Reactions   Ace Inhibitors Swelling    Patient states her tongue swell.   Penicillins Anxiety    Has  patient had a PCN reaction causing immediate rash, facial/tongue/throat swelling, SOB or lightheadedness with hypotension: No Has patient had a PCN reaction causing severe rash involving mucus membranes or skin necrosis: No Has patient had a PCN reaction that required hospitalization No Has patient had a PCN reaction occurring within the last 10 years: No If all of the above answers are "NO", then may proceed with Cephalosporin use.    I personally reviewed active problem list, medication list, allergies, family history, social history, health maintenance with the patient/caregiver today.   ROS  Constitutional: Negative for fever or weight change.  Respiratory: Negative for cough and shortness of breath.   Cardiovascular: Negative for chest pain or palpitations.  Gastrointestinal: Negative for abdominal pain, no bowel changes.  Musculoskeletal: Positive for gait problem or joint swelling.  Skin: Negative for rash.  Neurological: Negative for dizziness , positive for intermittent headache.  No other specific complaints in a complete review of systems (except as listed in HPI above).   Objective  Vitals:   07/22/21 1133  BP: 112/72  Pulse: 68  Resp: 16  Temp: 97.9 F (36.6 C)  SpO2: 99%  Weight: 161 lb (73 kg)  Height: $Remove'5\' 3"'dNLqoAk$  (1.6 m)    Body mass index is 28.52 kg/m.  Physical Exam  Constitutional: Patient appears  well-developed and well-nourished. Overweight. No distress.  HEENT: head atraumatic, normocephalic, pupils equal and reactive to light, neck supple Cardiovascular: Normal rate, regular rhythm and normal heart sounds.  No murmur heard. No BLE edema. Pulmonary/Chest: Effort normal and breath sounds normal. No respiratory distress. Abdominal: Soft.  There is no tenderness. Psychiatric: Patient has a normal mood and affect. behavior is normal. Judgment and thought content normal.   Recent Results (from the past 2160 hour(s))  TSH     Status: None   Collection Time: 06/11/21  9:33 AM  Result Value Ref Range   TSH 2.24 0.40 - 4.50 mIU/L  CBC with Differential/Platelet     Status: Abnormal   Collection Time: 06/11/21  9:33 AM  Result Value Ref Range   WBC 7.3 3.8 - 10.8 Thousand/uL   RBC 4.57 3.80 - 5.10 Million/uL   Hemoglobin 14.3 11.7 - 15.5 g/dL   HCT 42.5 35.0 - 45.0 %   MCV 93.0 80.0 - 100.0 fL   MCH 31.3 27.0 - 33.0 pg   MCHC 33.6 32.0 - 36.0 g/dL   RDW 13.2 11.0 - 15.0 %   Platelets 406 (H) 140 - 400 Thousand/uL   MPV 12.5 7.5 - 12.5 fL   Neutro Abs 4,694 1,500 - 7,800 cells/uL   Lymphs Abs 1,898 850 - 3,900 cells/uL   Absolute Monocytes 591 200 - 950 cells/uL   Eosinophils Absolute 88 15 - 500 cells/uL   Basophils Absolute 29 0 - 200 cells/uL   Neutrophils Relative % 64.3 %   Total Lymphocyte 26.0 %   Monocytes Relative 8.1 %   Eosinophils Relative 1.2 %   Basophils Relative 0.4 %  COMPLETE METABOLIC PANEL WITH GFR     Status: Abnormal   Collection Time: 06/11/21  9:33 AM  Result Value Ref Range   Glucose, Bld 106 (H) 65 - 99 mg/dL    Comment: .            Fasting reference interval . For someone without known diabetes, a glucose value between 100 and 125 mg/dL is consistent with prediabetes and should be confirmed with a follow-up test. .    BUN 14 7 -  25 mg/dL   Creat 0.82 0.60 - 1.00 mg/dL   eGFR 73 > OR = 60 mL/min/1.34m2    Comment: The eGFR is based on the  CKD-EPI 2021 equation. To calculate  the new eGFR from a previous Creatinine or Cystatin C result, go to https://www.kidney.org/professionals/ kdoqi/[email or web address]    BUN/Creatinine Ratio NOT APPLICABLE 6 - 22 (calc)   Sodium 140 135 - 146 mmol/L   Potassium 3.9 3.5 - 5.3 mmol/L   Chloride 106 98 - 110 mmol/L   CO2 27 20 - 32 mmol/L   Calcium 9.9 8.6 - 10.4 mg/dL   Total Protein 7.3 6.1 - 8.1 g/dL   Albumin 4.1 3.6 - 5.1 g/dL   Globulin 3.2 1.9 - 3.7 g/dL (calc)   AG Ratio 1.3 1.0 - 2.5 (calc)   Total Bilirubin 0.5 0.2 - 1.2 mg/dL   Alkaline phosphatase (APISO) 55 37 - 153 U/L   AST 16 10 - 35 U/L   ALT 20 6 - 29 U/L  Sed Rate (ESR)     Status: Abnormal   Collection Time: 06/11/21  9:33 AM  Result Value Ref Range   Sed Rate 63 (H) 0 - 30 mm/h  C-reactive protein     Status: None   Collection Time: 06/11/21  9:33 AM  Result Value Ref Range   CRP 1.0 <8.0 mg/L     PHQ2/9: Depression screen [facility] 2/9 07/22/2021 06/10/2021 05/18/2021 04/01/2021 02/03/2021  Decreased Interest 0 0 0 0 0  Down, Depressed, Hopeless 0 0 0 0 0  PHQ - 2 Score 0 0 0 0 0  Altered sleeping 0 0 0 - -  Tired, decreased energy 0 0 0 - -  Change in appetite 0 0 0 - -  Feeling bad or failure about yourself  0 0 0 - -  Trouble concentrating 0 0 0 - -  Moving slowly or fidgety/restless 0 0 0 - -  Suicidal thoughts 0 0 0 - -  PHQ-9 Score 0 0 0 - -  Difficult doing work/chores - Not difficult at all Not difficult at all - -  Some recent data might be hidden    phq 9 is negative   Fall Risk: Fall Risk  07/22/2021 06/10/2021 05/20/2021 05/18/2021 04/01/2021  Falls in the past year? 0 0 0 0 0  Number falls in past yr: 0 0 0 0 0  Injury with Fall? 0 0 - 0 0  Risk for fall due to : No Fall Risks No Fall Risks Impaired balance/gait;Impaired mobility Impaired balance/gait;Impaired mobility Impaired balance/gait  Risk for fall due to: Comment - - - - -  Follow up Falls prevention discussed Falls prevention  discussed Falls prevention discussed - Falls prevention discussed      Functional Status Survey: Is the patient deaf or have difficulty hearing?: No Does the patient have difficulty seeing, even when wearing glasses/contacts?: No Does the patient have difficulty concentrating, remembering, or making decisions?: No Does the patient have difficulty walking or climbing stairs?: No Does the patient have difficulty dressing or bathing?: No Does the patient have difficulty doing errands alone such as visiting a doctor's office or shopping?: No    Assessment & Plan  1. Essential hypertension  - amLODipine (NORVASC) 2.5 MG tablet; Take 3 tablets (7.5 mg total) by mouth daily. Take 2 in am and one in pm if bp is below 115/75  Dispense: 270 tablet; Refill: 0  2. Mild protein-calorie malnutrition ([place])  Not losing weight as  fast, but needs to increase calories to gain weight   3. Paroxysmal atrial fibrillation ([facility])  Rate controlled  4. Primary osteoarthritis of right knee  Going to have TKR done next month  5. Mild intermittent asthma without complication   6. OSA (obstructive sleep apnea)   7. Aneurysm ([place])   8. History of TIA (transient ischemic attack)   9. Pre-diabetes

## 2021-07-22 ENCOUNTER — Encounter: Payer: Self-pay | Admitting: Family Medicine

## 2021-07-22 ENCOUNTER — Ambulatory Visit (INDEPENDENT_AMBULATORY_CARE_PROVIDER_SITE_OTHER): Payer: Medicare Other | Admitting: Family Medicine

## 2021-07-22 VITALS — BP 112/72 | HR 68 | Temp 97.9°F | Resp 16 | Ht 63.0 in | Wt 161.0 lb

## 2021-07-22 DIAGNOSIS — I480 Paroxysmal atrial fibrillation: Secondary | ICD-10-CM | POA: Diagnosis not present

## 2021-07-22 DIAGNOSIS — G4733 Obstructive sleep apnea (adult) (pediatric): Secondary | ICD-10-CM

## 2021-07-22 DIAGNOSIS — M1711 Unilateral primary osteoarthritis, right knee: Secondary | ICD-10-CM

## 2021-07-22 DIAGNOSIS — I10 Essential (primary) hypertension: Secondary | ICD-10-CM | POA: Diagnosis not present

## 2021-07-22 DIAGNOSIS — Z8673 Personal history of transient ischemic attack (TIA), and cerebral infarction without residual deficits: Secondary | ICD-10-CM

## 2021-07-22 DIAGNOSIS — E441 Mild protein-calorie malnutrition: Secondary | ICD-10-CM

## 2021-07-22 DIAGNOSIS — J4520 Mild intermittent asthma, uncomplicated: Secondary | ICD-10-CM

## 2021-07-22 DIAGNOSIS — R7303 Prediabetes: Secondary | ICD-10-CM

## 2021-07-22 DIAGNOSIS — I729 Aneurysm of unspecified site: Secondary | ICD-10-CM | POA: Diagnosis not present

## 2021-07-22 MED ORDER — AMLODIPINE BESYLATE 2.5 MG PO TABS: 7.5000 mg | ORAL_TABLET | Freq: Every day | ORAL | 0 refills | Status: DC

## 2021-07-22 NOTE — Patient Instructions (Addendum)
May you be happy, may you be healthy , may you be safe   Continue amlodipine 5 mg in am but go down to pm dose to 2.5 ,mg

## 2021-07-28 ENCOUNTER — Other Ambulatory Visit: Payer: Self-pay | Admitting: Family Medicine

## 2021-07-28 DIAGNOSIS — I10 Essential (primary) hypertension: Secondary | ICD-10-CM

## 2021-07-29 NOTE — Discharge Instructions (Signed)
Instructions after Total Knee Replacement   Estevon Fluke P. Colbin Jovel, Jr., M.D.     Dept. of Orthopaedics & Sports Medicine  [facility]  [address]  [place], [place]  [address]  Phone: [phone]   Fax: [phone]    DIET: Drink plenty of non-alcoholic fluids. Resume your normal diet. Include foods high in fiber.  ACTIVITY:  You may use crutches or a walker with weight-bearing as tolerated, unless instructed otherwise. You may be weaned off of the walker or crutches by your Physical Therapist.  Do NOT place pillows under the knee. Anything placed under the knee could limit your ability to straighten the knee.   Continue doing gentle exercises. Exercising will reduce the pain and swelling, increase motion, and prevent muscle weakness.   Please continue to use the TED compression stockings for 6 weeks. You may remove the stockings at night, but should reapply them in the morning. Do not drive or operate any equipment until instructed.  WOUND CARE:  Continue to use the PolarCare or ice packs periodically to reduce pain and swelling. You may bathe or shower after the staples are removed at the first office visit following surgery.  MEDICATIONS: You may resume your regular medications. Please take the pain medication as prescribed on the medication. Do not take pain medication on an empty stomach. You have been given a prescription for a blood thinner (Lovenox or Coumadin). Please take the medication as instructed. (NOTE: After completing a 2 week course of Lovenox, take one Enteric-coated aspirin once a day. This along with elevation will help reduce the possibility of phlebitis in your operated leg.) Do not drive or drink alcoholic beverages when taking pain medications.  CALL THE OFFICE FOR: Temperature above 101 degrees Excessive bleeding or drainage on the dressing. Excessive swelling, coldness, or paleness of the toes. Persistent nausea and vomiting.  FOLLOW-UP:  You  should have an appointment to return to the office in 10-14 days after surgery. Arrangements have been made for continuation of Physical Therapy (either home therapy or outpatient therapy).   [facility] Department Directory         [email or web address]       [email or web address]          Cardiology  Appointments: [place] - [phone] [place] - [phone]  Endocrinology  Appointments: [place] - [phone] [place] - [phone]  Gastroenterology  Appointments: [place] - [phone] [place] - [phone]        General Surgery   Appointments: [place] - [phone]  Internal Medicine/Family Medicine  Appointments: [place] - [phone] [place] - [phone] [place] - [phone]  Metabolic and Weigh Loss Surgery  Appointments: [place] - [phone]        Neurology  Appointments: [place] - [phone] [place] - [phone]  Neurosurgery  Appointments: [place] - [phone]  Obstetrics & Gynecology  Appointments: [place] - [phone] [place] - [phone]        Pediatrics  Appointments: [place] - [phone] [place] - [phone]  Physiatry  Appointments: [place] -[phone]  Physical Therapy  Appointments: [place] - [phone] [place] - [phone]        Podiatry  Appointments: [place] - [phone] [place] - [phone]  Pulmonology  Appointments: [place] - [phone]  Rheumatology  Appointments: [place] - [phone]        [place] Location: [facility]  [address] , [place]  [address]  [place] Location: [facility] [address] [place], [place]  [address]  [place] Location: [facility] [address] [place], [place]  [address]    

## 2021-07-31 ENCOUNTER — Encounter
Admission: RE | Admit: 2021-07-31 | Discharge: 2021-07-31 | Disposition: A | Discharge status: Home / Self Care | Payer: Medicare Other | Source: Ambulatory Visit | Attending: Orthopedic Surgery | Admitting: Orthopedic Surgery

## 2021-07-31 ENCOUNTER — Other Ambulatory Visit: Payer: Self-pay

## 2021-07-31 VITALS — BP 121/69 | HR 61 | Resp 14 | Ht 63.0 in | Wt 160.0 lb

## 2021-07-31 DIAGNOSIS — Z01818 Encounter for other preprocedural examination: Secondary | ICD-10-CM | POA: Insufficient documentation

## 2021-07-31 DIAGNOSIS — M1711 Unilateral primary osteoarthritis, right knee: Secondary | ICD-10-CM | POA: Diagnosis not present

## 2021-07-31 DIAGNOSIS — Z880 Allergy status to penicillin: Secondary | ICD-10-CM

## 2021-07-31 DIAGNOSIS — E875 Hyperkalemia: Secondary | ICD-10-CM | POA: Diagnosis not present

## 2021-07-31 DIAGNOSIS — E876 Hypokalemia: Secondary | ICD-10-CM

## 2021-07-31 DIAGNOSIS — Z01812 Encounter for preprocedural laboratory examination: Secondary | ICD-10-CM

## 2021-07-31 DIAGNOSIS — Z7901 Long term (current) use of anticoagulants: Secondary | ICD-10-CM | POA: Insufficient documentation

## 2021-07-31 LAB — TYPE AND SCREEN
ABO/RH(D): A POS
Antibody Screen: NEGATIVE

## 2021-07-31 LAB — URINALYSIS, MICROSCOPIC (REFLEX): Bacteria, UA: NONE SEEN

## 2021-07-31 LAB — SURGICAL PCR SCREEN
MRSA, PCR: NEGATIVE
Staphylococcus aureus: NEGATIVE

## 2021-07-31 LAB — CBC
HCT: 44.2 % (ref 36.0–46.0)
Hemoglobin: 14.8 g/dL (ref 12.0–15.0)
MCH: 30.3 pg (ref 26.0–34.0)
MCHC: 33.5 g/dL (ref 30.0–36.0)
MCV: 90.4 fL (ref 80.0–100.0)
Platelets: 414 10*3/uL — ABNORMAL HIGH (ref 150–400)
RBC: 4.89 MIL/uL (ref 3.87–5.11)
RDW: 12.8 % (ref 11.5–15.5)
WBC: 12.7 10*3/uL — ABNORMAL HIGH (ref 4.0–10.5)
nRBC: 0 % (ref 0.0–0.2)

## 2021-07-31 LAB — COMPREHENSIVE METABOLIC PANEL WITH GFR
ALT: 25 U/L (ref 0–44)
AST: 22 U/L (ref 15–41)
Albumin: 4.1 g/dL (ref 3.5–5.0)
Alkaline Phosphatase: 57 U/L (ref 38–126)
Anion gap: 9 (ref 5–15)
BUN: 24 mg/dL — ABNORMAL HIGH (ref 8–23)
CO2: 26 mmol/L (ref 22–32)
Calcium: 9.8 mg/dL (ref 8.9–10.3)
Chloride: 103 mmol/L (ref 98–111)
Creatinine, Ser: 0.95 mg/dL (ref 0.44–1.00)
GFR, Estimated: >60 mL/min
Glucose, Bld: 88 mg/dL (ref 70–99)
Potassium: 2.9 mmol/L — ABNORMAL LOW (ref 3.5–5.1)
Sodium: 138 mmol/L (ref 135–145)
Total Bilirubin: 0.4 mg/dL (ref 0.3–1.2)
Total Protein: 8.8 g/dL — ABNORMAL HIGH (ref 6.5–8.1)

## 2021-07-31 LAB — URINALYSIS, ROUTINE W REFLEX MICROSCOPIC
Bilirubin Urine: NEGATIVE
Glucose, UA: NEGATIVE mg/dL
Hgb urine dipstick: NEGATIVE
Leukocytes,Ua: NEGATIVE
Nitrite: NEGATIVE
Specific Gravity, Urine: 1.02 (ref 1.005–1.030)
pH: 5.5 (ref 5.0–8.0)

## 2021-07-31 LAB — PROTIME-INR
INR: 1.1 (ref 0.8–1.2)
Prothrombin Time: 14.6 s (ref 11.4–15.2)

## 2021-07-31 LAB — SEDIMENTATION RATE: Sed Rate: 33 mm/h — ABNORMAL HIGH (ref 0–30)

## 2021-07-31 LAB — C-REACTIVE PROTEIN: CRP: 0.9 mg/dL

## 2021-07-31 LAB — APTT: aPTT: 31 s (ref 24–36)

## 2021-07-31 MED ORDER — POTASSIUM CHLORIDE ER 10 MEQ PO TBCR: 10.0000 meq | EXTENDED_RELEASE_TABLET | Freq: Every day | ORAL | 0 refills | Status: DC

## 2021-07-31 NOTE — Patient Instructions (Addendum)
Your procedure is scheduled on: 08/14/21 Report to [place]. To find out your arrival time please call [phone] between 1PM - 3PM on 08/13/21.  Remember: Instructions that are not followed completely may result in serious medical risk, up to and including death, or upon the discretion of your surgeon and anesthesiologist your surgery may need to be rescheduled.     _X__ 1. Do not eat food after midnight the night before your procedure.                 No gum chewing or hard candies. You may drink clear liquids up to 2 hours                 before you are scheduled to arrive for your surgery- DO not drink clear                 liquids within 2 hours of the start of your surgery.                 Clear Liquids include:  water, apple juice without pulp, clear carbohydrate                 drink such as Clearfast or Gatorade, Black Coffee or Tea (Do not add                 anything to coffee or tea). Diabetics water only  __X__2.  On the morning of surgery brush your teeth with toothpaste and water, you                 may rinse your mouth with mouthwash if you wish.  Do not swallow any              toothpaste of mouthwash.     _X__ 3.  No Alcohol for 24 hours before or after surgery.   _X__ 4.  Do Not Smoke or use e-cigarettes For 24 Hours Prior to Your Surgery.                 Do not use any chewable tobacco products for at least 6 hours prior to                 surgery.  ____  5.  Bring all medications with you on the day of surgery if instructed.   __X__  6.  Notify your doctor if there is any change in your medical condition      (cold, fever, infections).     Do not wear jewelry, make-up, hairpins, clips or nail polish. Do not wear lotions, powders, or perfumes.  Do not shave body hair 48 hours prior to surgery. Men may shave face and neck. Do not bring valuables to the hospital.    [facility] is not responsible for any  belongings or valuables.  Contacts, dentures/partials or body piercings may not be worn into surgery. Bring a case for your contacts, glasses or hearing aids, a denture cup will be supplied. Leave your suitcase in the car. After surgery it may be brought to your room. For patients admitted to the hospital, discharge time is determined by your treatment team.   Patients discharged the day of surgery will not be allowed to drive home.   Please read over the following fact sheets that you were given:   MRSA Information, CHG soap, Incentive Spirometer, Ensure  __X__ Take these medicines the morning of surgery  with A SIP OF WATER:    1. amLODipine (NORVASC) 5 MG tablet  2.   3.   4.  5. diltiazem (CARDIZEM) 30 MG tablet if needed  6.  ____ Fleet Enema (as directed)   __X__ Use CHG Soap/SAGE wipes as directed  ____ Use inhalers on the day of surgery  ____ Stop metformin/Janumet/Farxiga 2 days prior to surgery    ____ Take 1/2 of usual insulin dose the night before surgery. No insulin the morning          of surgery.   ____ Stop Blood Thinners Coumadin/Plavix/Xarelto/Pleta/Pradaxa/Eliquis/Effient/Aspirin  on   Or contact your Surgeon, Cardiologist or Medical Doctor regarding  ability to stop your blood thinners  __X__ Stop Anti-inflammatories 7 days before surgery such as Advil, Ibuprofen, Motrin,  BC or Goodies Powder, Naprosyn, Naproxen, Aleve, Aspirin    __X__ Stop all herbals and  supplements, fish oil or vitamins  until after surgery.    ____ Bring C-Pap to the hospital.    LAST DOSE OF ELIQUIS WILL BE 08/10/21

## 2021-07-31 NOTE — Progress Notes (Signed)
°  [facility] Perioperative Services: Pre-Admission/Anesthesia Testing  Abnormal Lab Notification   Date: 07/31/21  Name: [name] MRN:   [id number]  Re: Abnormal labs noted during PAT appointment   Notified:  Provider Name Provider Role Notification Mode  Skip Estimable, MD Orthopedics (Surgeon) Routed and/or faxed via [place] and Notes:  ABNORMAL LAB VALUE(S): Lab Results  Component Value Date   K 2.9 (L) 07/31/2021   [name] is scheduled for an elective RIGHT COMPUTER ASSISTED TOTAL KNEE ARTHROPLASTY on 08/14/2021. In review of her medication reconciliation, it is noted that the patient is NOT taking any type of prescribed diuretic medications. Please note, in efforts to promote a safe and effective anesthetic course, per current guidelines/standards set by the [facility] anesthesia team, the minimal acceptable K+ level for the patient to proceed with general anesthesia is 3.0 mmol/L. With that being said, at the patient's current level, her procedure would need to be postponed for optimization of her potassium level.  In efforts to prevent case from having to be rescheduled we will plan on treating hyperkalemia as per the plan below.  Impression and Plan:  [name] noted to be HYPOkalemic on PAT labs performed on 07/31/2021; K+ 2.9 mmol/L.  Patient is not on any type of diuretic therapies.  Patient has 2 weeks prior to her surgery.  In efforts to ensure K+ level optimized prior to her procedure, we will plan on replacing potassium slowly over the course of the 2 weeks.  Called patient to discuss.  Prescription sent to patient's primary pharmacy as follows:  Meds ordered this encounter  Medications   potassium chloride (KLOR-CON) 10 MEQ tablet    Sig: Take 1 tablet (10 mEq total) by mouth daily for 14 days.    Dispense:  14 tablet    Refill:  0   Order entered to have patient potassium level rechecked on the morning of  surgery to ensure that level is optimized and safe to proceed with planned elective orthopedic procedure.  Plan of care sent to patient's primary attending surgeon to make him aware of her low potassium and plans for correction.  Additionally, patient encouraged to follow-up with her PCP postoperatively to discuss low potassium.  Patient may need daily oral supplementation to maintain normal K+ level. Patient advised to call PCP, surgeon, or PAT office with any questions or concerns that arise between now and the time of her surgery.    Honor Loh, MSN, APRN, FNP-C, CEN [facility]  Peri-operative Services Nurse Practitioner Phone: [phone] 07/31/21 3:52 PM  NOTE: This note has been prepared using Dragon dictation software. Despite my best ability to proofread, there is always the potential that unintentional transcriptional errors may still occur from this process.

## 2021-08-06 LAB — IGE: IgE (Immunoglobulin E), Serum: 251 [IU]/mL (ref 6–495)

## 2021-08-07 ENCOUNTER — Ambulatory Visit (INDEPENDENT_AMBULATORY_CARE_PROVIDER_SITE_OTHER): Payer: Medicare Other

## 2021-08-07 DIAGNOSIS — I10 Essential (primary) hypertension: Secondary | ICD-10-CM

## 2021-08-07 DIAGNOSIS — M1711 Unilateral primary osteoarthritis, right knee: Secondary | ICD-10-CM

## 2021-08-07 DIAGNOSIS — E441 Mild protein-calorie malnutrition: Secondary | ICD-10-CM

## 2021-08-07 NOTE — Patient Instructions (Signed)
Thank you for allowing the Chronic Care Management team to participate in your care. It was great speaking with you today! °

## 2021-08-07 NOTE — Chronic Care Management (AMB) (Signed)
Chronic Care Management   CCM RN Visit Note  08/07/2021 Name: [name] MRN: [id number] DOB: 1940-09-27  Subjective: [name] is a 81 y.o. year old female who is a primary care patient of Steele Sizer, MD. The care management team was consulted for assistance with disease management and care coordination needs.    Engaged with patient by telephone for follow up visit in response to provider referral for case management and care coordination services.   Consent to Services:  The patient was given information about Chronic Care Management services, agreed to services, and gave verbal consent prior to initiation of services.  Please see initial visit note for detailed documentation.   Assessment: Review of patient past medical history, allergies, medications, health status, including review of consultants reports, laboratory and other test data, was performed as part of comprehensive evaluation and provision of chronic care management services.   SDOH (Social Determinants of Health) assessments and interventions performed: No  CCM Care Plan  Allergies  Allergen Reactions   Ace Inhibitors Swelling    Patient states her tongue swell.   Penicillins Anxiety    Has patient had a PCN reaction causing immediate rash, facial/tongue/throat swelling, SOB or lightheadedness with hypotension: No Has patient had a PCN reaction causing severe rash involving mucus membranes or skin necrosis: No Has patient had a PCN reaction that required hospitalization No Has patient had a PCN reaction occurring within the last 10 years: No If all of the above answers are "NO", then may proceed with Cephalosporin use.    Outpatient Encounter Medications as of 08/07/2021  Medication Sig Note   potassium chloride (KLOR-CON) 10 MEQ tablet Take 1 tablet (10 mEq total) by mouth daily for 14 days.    acetaminophen (TYLENOL) 650 MG CR tablet Take 650 mg by mouth every 8 (eight) hours as needed for  pain.    albuterol (VENTOLIN HFA) 108 (90 Base) MCG/ACT inhaler INHALE 2 PUFFS INTO THE LUNGS EVERY 6 HOURS AS NEEDED FOR WHEEZING OR SHORTNESS OF BREATH    amLODipine (NORVASC) 2.5 MG tablet Take 3 tablets (7.5 mg total) by mouth daily. Take 2 in am and one in pm if bp is below 115/75 (Patient taking differently: Take 5 mg by mouth 2 (two) times daily. Take 2 in am and one in pm if bp is below 115/75)    apixaban (ELIQUIS) 5 MG TABS tablet TAKE 1 TABLET(5 MG) BY MOUTH TWICE DAILY    Calcium-Magnesium-Vitamin D (CALCIUM 1200+D3 PO) Take 1 tablet by mouth 2 (two) times a week. 1200 mg calcium / 1000 units Vitamin D3    diltiazem (CARDIZEM) 30 MG tablet Take 1 tablet (30 mg total) by mouth every 6 (six) hours as needed (for tachycardia/recurrent Afib.). 04/29/2021: Has not needed in >6 months   meclizine (ANTIVERT) 25 MG tablet Take 25 mg by mouth 3 (three) times daily as needed (vertigo/dizziness).    rosuvastatin (CRESTOR) 10 MG tablet TAKE 1 TABLET(10 MG) BY MOUTH DAILY (Patient taking differently: Take 10 mg by mouth at bedtime.)    No facility-administered encounter medications on file as of 08/07/2021.    Patient Active Problem List   Diagnosis Date Noted   Pain of left lower extremity 05/10/2021   Osteoarthritis of right knee 05/05/2020   History of colonic polyps    Polyp of transverse colon    Aneurysm ([place]) 02/08/2018   History of CVA (cerebrovascular accident) without residual deficits 01/24/2018   Aneurysm of ophthalmic artery 01/24/2018  TIA (transient ischemic attack) 01/22/2018   Impingement syndrome of shoulder region 01/02/2018   History of cataract surgery, right 09/01/2016   Paroxysmal atrial fibrillation ([place]) 02/24/2016   Vertigo 02/24/2016   Low HDL (under 40) 02/17/2016   Dizziness 02/15/2016   Hx of colonic polyps    Benign neoplasm of sigmoid colon    Asthma, mild intermittent 02/21/2015   Hyperglycemia 02/21/2015   Essential hypertension 02/20/2015    Inconclusive mammogram due to dense breasts 01/02/2015   Dense breast 01/02/2015   Abnormal EKG 02/23/2013    Patient Care Plan: RN Care Management Plan of Care     Problem Identified: A-Fib, HTN, Diverticulitis, Chronic Joint Pain      Long-Range Goal: Disease Progression Prevented or Minimized   Start Date: 08/07/2021  Expected End Date: 11/05/2021  Priority: High  Note:   Current Barriers:  Chronic Disease Management support and education needs related to Atrial Fibrillation, HTN, HLD, and Chronic Joint Pain.  RNCM Clinical Goal(s):  Patient will continue to work with RN Care Manager and the care management team to address care management and care coordination needs related to  Atrial Fibrillation, HTN, HLD, and Chronic Joint Pain.  Interventions: 1:1 collaboration with primary care provider regarding development and update of comprehensive plan of care as evidenced by provider attestation and co-signature Inter-disciplinary care team collaboration (see longitudinal plan of care) Evaluation of current treatment plan related to  self management and patient's adherence to plan as established by provider   Fall Risk: Reviewed information regarding safety and fall prevention. Pending a total right knee replacement on 08/14/21. Reports having needed assistive devices in the home to use when she returns Reviewed pre-surgery instructions. Verbalized understanding of need to stop Eliquis on 08/10/21. Will complete Covid testing as required. Discussed potential in-home needs post procedure. Anticipates completing home health physical therapy after discharge. Confirmed that her daughter will be available in the home to assist as needed following her procedure. Agreed to update the care management team if additional assistance is required.   Pain Interventions:  Reviewed plan for pain management. She continues to experience pain in her left leg. Received an injection for pain with minimal  relief. Reports she will follow up with the [facility] team within the next month to discuss other treatment options.   Hypertension Interventions:  Reviewed plan for hypertension management. Reports excellent compliance. Monitoring BP as advised. Reports readings have been within range. Advised to continue monitoring and recording readings.  Reviewed s/sx of heart attack, stroke and worsening symptoms that require immediate medical attention.   Unintentional Weight Loss: Reviewed plan regarding unintentional weight loss. Reports consuming protein supplement beverages as advised. Reports weight has remained stable between 159 -160 lbs over the past month.  Discussed nutritional intake. Continues to monitor intake to ensure compliance with a heart healthy diet. Reports a very good appetite. Agreed to contact the clinic or update the care management team if additional assistance is required.   Patient Goals/Self-Care Activities: Patient will self administer medications as prescribed Patient will attend all scheduled provider appointments Patient will call pharmacy for medication refills Patient will continue to perform ADL's independently Patient will call provider office for new concerns or questions   PLAN Will follow up later this month       PLAN: A member of the care management team will follow up later this month.   [name] Care Management [facility] [phone]

## 2021-08-09 ENCOUNTER — Encounter: Payer: Self-pay | Admitting: Orthopedic Surgery

## 2021-08-09 NOTE — H&P (Signed)
ORTHOPAEDIC HISTORY & PHYSICAL Roneisha Stern, Florinda Marker., MD - 07/31/2021 2:00 PM EST Formatting of this note is different from the original. Images from the original note were not included. Chief Complaint: No chief complaint on file.  Reason for Visit: The patient is a 81 y.o. female who presents today with her cousin Helene Kelp for reevaluation of her right knee. She underwent right knee arthroscopy, partial medial meniscectomy, and chondroplasty on 10/08/2020. She was noted to have grade 3 chondromalacia of the medial compartment. She did well initially, but had the onset of severe pain and swelling 2 weeks following the index procedure. Aspiration of the knee was concerning for a septic knee and the patient underwent arthroscopic irrigation and debridement on 10/21/2020. All culture results were subsequently noted to be negative.   The patient reports continued pain and swelling to the right knee. She localizes the most significant pain along the medial aspect of the knee. Weightbearing activities tend to increase the pain. She has not seen any appreciable improvement despite use of Tylenol and regular exercise. She is unable to tolerate NSAIDs due to anticoagulation with Eliquis. She has been limping due to the progressive right knee pain. The patient states that the knee pain has progressed to the point that it is significantly interfering with her activities of daily living.  The patient does have a history of left leg pain. The pain extends from the upper thigh across the knee and down to the left ankle and foot. She also states that the bottom of the left foot "feels numb". The pain is present with standing, sitting, or laying down. She states that the pain is so severe that she has trouble walking. She denies any bowel or bladder dysfunction. The patient has not appreciated any significant improvement despite Tylenol, narcotic analgesia, and activity modification. Lumbar MRI on 05/16/2021  demonstrated multilevel facet arthropathy which is severe on the left at L4-5 and bilaterally at L5-S1. She appreciated significant improvement following left L3-4 and L4-5 epidural steroid injections as per Dr. Sharlet Salina. More recently she has appreciated some return of the.l lower extremity symptoms.  Medications: Current Outpatient Medications  Medication Sig Dispense Refill   acetaminophen (TYLENOL) 325 MG tablet Take 650 mg by mouth at bedtime as needed for Pain   amLODIPine (NORVASC) 2.5 MG tablet Take 2.5 mg by mouth once daily   cloNIDine HCl (CATAPRES) 0.1 MG tablet 0.1 mg 2 (two) times daily as needed   diltiazem (CARDIZEM) 30 MG tablet Take 30 mg by mouth once daily as needed (heart flutter) 3   ELIQUIS 5 mg tablet Take 5 mg by mouth 2 (two) times daily 3   meclizine (ANTIVERT) 25 mg tablet 25 mg 3 (three) times daily as needed 0   rosuvastatin (CRESTOR) 10 MG tablet Take 10 mg by mouth once daily   tiZANidine (ZANAFLEX) 2 MG tablet Take 1 tablet (2 mg total) by mouth every 8 (eight) hours as needed for Muscle spasms for up to 15 doses 15 tablet 0   No current facility-administered medications for this visit.   Allergies: Allergies  Allergen Reactions   Ace Inhibitors Swelling  Patient states her tongue swell.   Penicillins Anxiety   Past Medical History: Past Medical History:  Diagnosis Date   Atrial fibrillation (CMS-HCC)   Hyperlipidemia   Hypertension   Stroke (CMS-HCC)  TIA   Past Surgical History: Past Surgical History:  Procedure Laterality Date   Right knee arthroscopy, partial medial meniscectomy, and chondroplasty 10/08/2020  Dr  Dilia Alemany   Right knee arthroscopy, arthroscopic irrigation and debridement of the right knee 10/21/2020  Dr Marry Guan   CARPAL TUNNEL RELEASE   HYSTERECTOMY   Social History: Social History   Socioeconomic History   Marital status: Divorced   Number of children: 2   Years of education: 14   Highest education level: Associate  degree: academic program  Occupational History   Occupation: Part-time- Oceanographer- High School  Tobacco Use   Smoking status: Never   Smokeless tobacco: Never  Scientific laboratory technician Use: Never used  Substance and Sexual Activity   Alcohol use: Never   Drug use: Never   Sexual activity: Defer  Partners: Male  Social History Narrative  Education: na  Occupation: retired  Hobbies: na  Marital Status: divorced   Family History: Family History  Problem Relation Age of Onset   Aneurysm Mother   Cancer Brother   High blood pressure (Hypertension) Daughter   Migraines Daughter   Review of Systems: A comprehensive 14 point ROS was performed, reviewed, and the pertinent orthopaedic findings are documented in the HPI.  Exam BP 122/74   Ht 160 cm (5' 2.99")   Wt 71.4 kg (157 lb 6.4 oz)   BMI 27.89 kg/m   General:  Well-developed, well-nourished female seen in no acute distress.  Gait was not assessed since the patient presented in a wheelchair.   HEENT:  Atraumatic, normocephalic. Pupils are equal and reactive to light. Extraocular motion is intact. Sclera are clear. Oropharynx is clear with moist mucosa.  Neck:  Supple, nontender, and with good ROM. No thyromegaly, adenopathy, JVD, or carotid bruits.  Lungs:  Clear to auscultation bilaterally.  Cardiovascular:  Regular rate and rhythm. Normal S1, S2. No murmur . No appreciable gallops or rubs. Peripheral pulses are palpable. No lower extremity edema. Homan`s test is negative.  Abdomen:  Soft, nontender, nondistended. Bowel sounds are present.  Extremities: Good strength, stability, and range of motion of the upper extremities. Good range of motion of the hips and ankles.  Right Knee: Soft tissue swelling: minimal Effusion: none Erythema: none Crepitance: mild Tenderness: medial Alignment: relative varus Mediolateral laxity: medial pseudolaxity Posterior sag: negative Patellar tracking: Good tracking  without evidence of subluxation or tilt Atrophy: No significant atrophy.  Quadriceps tone was fair to good. Range of motion: 0/0/116 degrees   SPINE: Pelvic tilt: Negative Limb lengths: Equal with the patient standing Alignment: No gross scoliosis. Normal lumbar lordosis. Sacroiliac joints: Nontender to palpation Patrick`s test: Negative Flip test: Equivocal on the left  Straight leg raise: Negative Tenderness: Mild tenderness to palpation along the lower lumbar region Paraspinous spasm: Mild Range of motion: Mild guarding with flexion, extension, or rotation   Neurologic:  Awake, alert, and oriented.  Sensory function is intact to pinprick and light touch with the exception of the left lower leg.  Motor strength is judged to be 5/5.  Motor coordination is within normal limits.  No apparent clonus. No tremor.   X-rays: I ordered and interpreted standing AP, lateral, and sunrise radiographs of the right knee that were obtained in the office today. There is narrowing of the medial cartilage space with associated varus alignment. Osteophyte formation is noted. Subchondral sclerosis is noted. No evidence of fracture or dislocation.   Impression: Degenerative arthrosis of the right knee Lumbar spondylosis with left lower extremity radicular symptoms   Plan:  The findings were discussed in detail with the patient. The patient was given informational material on total knee  replacement. Conservative treatment options were reviewed with the patient. We discussed the risks and benefits of surgical intervention. The usual perioperative course was also discussed in detail. The patient expressed understanding of the risks and benefits of surgical intervention and would like to proceed with plans for right total knee arthroplasty.  We again discussed the likelihood that the left lower extremity is related to the lumbar pathology. I suggested that she contact Dr. Sharlet Salina for consideration of  another epidural steroid injection so the left lower extremity radicular symptoms do not interfere with her right knee rehabilitation.  I instructed the patient to discontinue the Eliquis 3 days prior to the surgery.  I spent a total of 50 minutes in both face-to-face and non-face-to-face activities, excluding procedures performed, for this visit on the date of this encounter.  MEDICAL CLEARANCE: Per anesthesiology. ACTIVITY: As tolerated. WORK STATUS: Not applicable. THERAPY: Preoperative physical therapy evaluation. MEDICATIONS: Requested Prescriptions   No prescriptions requested or ordered in this encounter   FOLLOW-UP: Return for postoperative follow-up.  Turon Kilmer P. Holley Bouche., M.D.  This note was generated in part with voice recognition software and I apologize for any typographical errors that were not detected and corrected.  Electronically signed by Lamar Benes., MD at 08/02/2021 6:35 AM EST

## 2021-08-12 ENCOUNTER — Other Ambulatory Visit
Admission: RE | Admit: 2021-08-12 | Discharge: 2021-08-12 | Disposition: A | Discharge status: Home / Self Care | Payer: Medicare Other | Source: Ambulatory Visit | Attending: Orthopedic Surgery | Admitting: Orthopedic Surgery

## 2021-08-12 ENCOUNTER — Other Ambulatory Visit: Payer: Self-pay

## 2021-08-12 DIAGNOSIS — Z1152 Encounter for screening for COVID-19: Secondary | ICD-10-CM

## 2021-08-12 DIAGNOSIS — Z01812 Encounter for preprocedural laboratory examination: Secondary | ICD-10-CM | POA: Diagnosis present

## 2021-08-12 DIAGNOSIS — Z20822 Contact with and (suspected) exposure to covid-19: Secondary | ICD-10-CM | POA: Insufficient documentation

## 2021-08-12 LAB — SARS CORONAVIRUS 2 (TAT 6-24 HRS): SARS Coronavirus 2: NEGATIVE

## 2021-08-14 ENCOUNTER — Observation Stay: Payer: Medicare Other

## 2021-08-14 ENCOUNTER — Ambulatory Visit: Payer: Medicare Other | Admitting: Urgent Care

## 2021-08-14 ENCOUNTER — Ambulatory Visit: Payer: Medicare Other | Admitting: Certified Registered"

## 2021-08-14 ENCOUNTER — Encounter: Payer: Self-pay | Admitting: Orthopedic Surgery

## 2021-08-14 ENCOUNTER — Encounter
Admission: RE | Disposition: A | Discharge status: Home Health | Payer: Self-pay | Source: Ambulatory Visit | Attending: Orthopedic Surgery

## 2021-08-14 ENCOUNTER — Observation Stay
Admission: RE | Admit: 2021-08-14 | Discharge: 2021-08-15 | Disposition: A | Discharge status: Home Health | Payer: Medicare Other | Source: Ambulatory Visit | Attending: Orthopedic Surgery | Admitting: Orthopedic Surgery

## 2021-08-14 ENCOUNTER — Other Ambulatory Visit: Payer: Self-pay

## 2021-08-14 DIAGNOSIS — Z880 Allergy status to penicillin: Secondary | ICD-10-CM

## 2021-08-14 DIAGNOSIS — I10 Essential (primary) hypertension: Secondary | ICD-10-CM | POA: Insufficient documentation

## 2021-08-14 DIAGNOSIS — Z79899 Other long term (current) drug therapy: Secondary | ICD-10-CM | POA: Insufficient documentation

## 2021-08-14 DIAGNOSIS — J45909 Unspecified asthma, uncomplicated: Secondary | ICD-10-CM | POA: Insufficient documentation

## 2021-08-14 DIAGNOSIS — Z96651 Presence of right artificial knee joint: Secondary | ICD-10-CM

## 2021-08-14 DIAGNOSIS — Z7901 Long term (current) use of anticoagulants: Secondary | ICD-10-CM | POA: Diagnosis not present

## 2021-08-14 DIAGNOSIS — I4891 Unspecified atrial fibrillation: Secondary | ICD-10-CM | POA: Insufficient documentation

## 2021-08-14 DIAGNOSIS — M1711 Unilateral primary osteoarthritis, right knee: Principal | ICD-10-CM | POA: Insufficient documentation

## 2021-08-14 DIAGNOSIS — Z96659 Presence of unspecified artificial knee joint: Secondary | ICD-10-CM

## 2021-08-14 HISTORY — PX: KNEE ARTHROPLASTY: SHX992

## 2021-08-14 LAB — POCT I-STAT, CHEM 8
BUN: 18 mg/dL (ref 8–23)
Calcium, Ion: 1.29 mmol/L (ref 1.15–1.40)
Chloride: 104 mmol/L (ref 98–111)
Creatinine, Ser: 0.9 mg/dL (ref 0.44–1.00)
Glucose, Bld: 113 mg/dL — ABNORMAL HIGH (ref 70–99)
HCT: 41 % (ref 36.0–46.0)
Hemoglobin: 13.9 g/dL (ref 12.0–15.0)
Potassium: 3.9 mmol/L (ref 3.5–5.1)
Sodium: 138 mmol/L (ref 135–145)
TCO2: 23 mmol/L (ref 22–32)

## 2021-08-14 SURGERY — COMPUTER ASSISTED TOTAL KNEE ARTHROPLASTY
Anesthesia: General | Site: Knee | Laterality: Right

## 2021-08-14 MED ORDER — FAMOTIDINE 20 MG PO TABS
ORAL_TABLET | ORAL | Status: AC
  Administered 2021-08-14: 20 mg via ORAL
  Filled 2021-08-14: qty 1

## 2021-08-14 MED ORDER — ACETAMINOPHEN 10 MG/ML IV SOLN
1000.0000 mg | Freq: Four times a day (QID) | INTRAVENOUS | Status: AC
  Administered 2021-08-14 – 2021-08-15 (×4): 1000 mg via INTRAVENOUS
  Filled 2021-08-14 (×4): qty 100

## 2021-08-14 MED ORDER — PHENOL 1.4 % MT LIQD
1.0000 | OROMUCOSAL | Status: DC | PRN
  Filled 2021-08-14: qty 177

## 2021-08-14 MED ORDER — DEXAMETHASONE SODIUM PHOSPHATE 10 MG/ML IJ SOLN
INTRAMUSCULAR | Status: AC
  Administered 2021-08-14: 8 mg via INTRAVENOUS
  Filled 2021-08-14: qty 1

## 2021-08-14 MED ORDER — ONDANSETRON HCL 4 MG/2ML IJ SOLN
INTRAMUSCULAR | Status: DC | PRN
  Administered 2021-08-14: 4 mg via INTRAVENOUS

## 2021-08-14 MED ORDER — MIDAZOLAM HCL 2 MG/2ML IJ SOLN
INTRAMUSCULAR | Status: AC
  Filled 2021-08-14: qty 2

## 2021-08-14 MED ORDER — ALUM & MAG HYDROXIDE-SIMETH 200-200-20 MG/5ML PO SUSP: 30.0000 mL | ORAL | Status: DC | PRN

## 2021-08-14 MED ORDER — CELECOXIB 200 MG PO CAPS
ORAL_CAPSULE | ORAL | Status: AC
  Filled 2021-08-14: qty 1

## 2021-08-14 MED ORDER — SODIUM CHLORIDE 0.9 % IR SOLN
Status: DC | PRN
  Administered 2021-08-14: 3000 mL

## 2021-08-14 MED ORDER — MENTHOL 3 MG MT LOZG
1.0000 | LOZENGE | OROMUCOSAL | Status: DC | PRN
  Filled 2021-08-14: qty 9

## 2021-08-14 MED ORDER — CELECOXIB 200 MG PO CAPS
200.0000 mg | ORAL_CAPSULE | Freq: Two times a day (BID) | ORAL | Status: DC
  Administered 2021-08-15 (×2): 200 mg via ORAL
  Filled 2021-08-14 (×2): qty 1

## 2021-08-14 MED ORDER — ACETAMINOPHEN 325 MG PO TABS: 325.0000 mg | ORAL_TABLET | Freq: Four times a day (QID) | ORAL | Status: DC | PRN

## 2021-08-14 MED ORDER — MECLIZINE HCL 25 MG PO TABS
25.0000 mg | ORAL_TABLET | Freq: Three times a day (TID) | ORAL | Status: DC | PRN
  Filled 2021-08-14: qty 1

## 2021-08-14 MED ORDER — SENNOSIDES-DOCUSATE SODIUM 8.6-50 MG PO TABS
1.0000 | ORAL_TABLET | Freq: Two times a day (BID) | ORAL | Status: DC
  Administered 2021-08-14 – 2021-08-15 (×2): 1 via ORAL
  Filled 2021-08-14 (×2): qty 1

## 2021-08-14 MED ORDER — LACTATED RINGERS IV SOLN: INTRAVENOUS | Status: DC

## 2021-08-14 MED ORDER — CHLORHEXIDINE GLUCONATE 0.12 % MT SOLN: 15.0000 mL | Freq: Once | OROMUCOSAL | Status: AC

## 2021-08-14 MED ORDER — PANTOPRAZOLE SODIUM 40 MG PO TBEC
40.0000 mg | DELAYED_RELEASE_TABLET | Freq: Two times a day (BID) | ORAL | Status: DC
  Administered 2021-08-14 – 2021-08-15 (×2): 40 mg via ORAL
  Filled 2021-08-14 (×2): qty 1

## 2021-08-14 MED ORDER — ALBUTEROL SULFATE (2.5 MG/3ML) 0.083% IN NEBU: 3.0000 mL | INHALATION_SOLUTION | Freq: Four times a day (QID) | RESPIRATORY_TRACT | Status: DC | PRN

## 2021-08-14 MED ORDER — FENTANYL CITRATE (PF) 100 MCG/2ML IJ SOLN
INTRAMUSCULAR | Status: DC | PRN
  Administered 2021-08-14: 25 ug via INTRAVENOUS

## 2021-08-14 MED ORDER — GABAPENTIN 300 MG PO CAPS
300.0000 mg | ORAL_CAPSULE | Freq: Once | ORAL | Status: AC
  Administered 2021-08-14: 300 mg via ORAL

## 2021-08-14 MED ORDER — DIPHENHYDRAMINE HCL 12.5 MG/5ML PO ELIX: 12.5000 mg | ORAL_SOLUTION | ORAL | Status: DC | PRN

## 2021-08-14 MED ORDER — METOCLOPRAMIDE HCL 10 MG PO TABS
10.0000 mg | ORAL_TABLET | Freq: Three times a day (TID) | ORAL | Status: DC
  Administered 2021-08-14 – 2021-08-15 (×4): 10 mg via ORAL
  Filled 2021-08-14 (×4): qty 1

## 2021-08-14 MED ORDER — PROPOFOL 1000 MG/100ML IV EMUL
INTRAVENOUS | Status: AC
  Filled 2021-08-14: qty 100

## 2021-08-14 MED ORDER — CELECOXIB 200 MG PO CAPS
400.0000 mg | ORAL_CAPSULE | Freq: Once | ORAL | Status: AC
  Administered 2021-08-14: 400 mg via ORAL

## 2021-08-14 MED ORDER — ORAL CARE MOUTH RINSE: 15.0000 mL | Freq: Once | OROMUCOSAL | Status: AC

## 2021-08-14 MED ORDER — TRANEXAMIC ACID-NACL 1000-0.7 MG/100ML-% IV SOLN
INTRAVENOUS | Status: DC | PRN
  Administered 2021-08-14: 1000 mg via INTRAVENOUS

## 2021-08-14 MED ORDER — CHLORHEXIDINE GLUCONATE 4 % EX LIQD: 60.0000 mL | Freq: Once | CUTANEOUS | Status: DC

## 2021-08-14 MED ORDER — ENSURE PRE-SURGERY PO LIQD
296.0000 mL | Freq: Once | ORAL | Status: AC
  Administered 2021-08-14: 296 mL via ORAL
  Filled 2021-08-14: qty 296

## 2021-08-14 MED ORDER — TRANEXAMIC ACID-NACL 1000-0.7 MG/100ML-% IV SOLN
INTRAVENOUS | Status: AC
  Administered 2021-08-14: 1000 mg via INTRAVENOUS
  Filled 2021-08-14: qty 100

## 2021-08-14 MED ORDER — EPHEDRINE 5 MG/ML INJ
INTRAVENOUS | Status: AC
  Filled 2021-08-14: qty 5

## 2021-08-14 MED ORDER — MIDAZOLAM HCL 5 MG/5ML IJ SOLN
INTRAMUSCULAR | Status: DC | PRN
  Administered 2021-08-14: .5 mg via INTRAVENOUS

## 2021-08-14 MED ORDER — FLEET ENEMA 7-19 GM/118ML RE ENEM: 1.0000 | ENEMA | Freq: Once | RECTAL | Status: DC | PRN

## 2021-08-14 MED ORDER — HYDROMORPHONE HCL 1 MG/ML IJ SOLN: 0.5000 mg | INTRAMUSCULAR | Status: DC | PRN

## 2021-08-14 MED ORDER — PHENYLEPHRINE HCL (PRESSORS) 10 MG/ML IV SOLN
INTRAVENOUS | Status: AC
  Filled 2021-08-14: qty 1

## 2021-08-14 MED ORDER — PRONTOSAN WOUND IRRIGATION OPTIME
TOPICAL | Status: DC | PRN
  Administered 2021-08-14: 1 "application "

## 2021-08-14 MED ORDER — TRANEXAMIC ACID-NACL 1000-0.7 MG/100ML-% IV SOLN: 1000.0000 mg | Freq: Once | INTRAVENOUS | Status: AC

## 2021-08-14 MED ORDER — ONDANSETRON HCL 4 MG/2ML IJ SOLN
INTRAMUSCULAR | Status: AC
  Filled 2021-08-14: qty 2

## 2021-08-14 MED ORDER — SODIUM CHLORIDE (PF) 0.9 % IJ SOLN
INTRAMUSCULAR | Status: DC | PRN
  Administered 2021-08-14: 120 mL via INTRAMUSCULAR

## 2021-08-14 MED ORDER — OXYCODONE HCL 5 MG PO TABS: 5.0000 mg | ORAL_TABLET | ORAL | Status: DC | PRN

## 2021-08-14 MED ORDER — CEFAZOLIN SODIUM-DEXTROSE 2-4 GM/100ML-% IV SOLN
INTRAVENOUS | Status: AC
  Filled 2021-08-14: qty 100

## 2021-08-14 MED ORDER — 0.9 % SODIUM CHLORIDE (POUR BTL) OPTIME
TOPICAL | Status: DC | PRN
  Administered 2021-08-14: 500 mL

## 2021-08-14 MED ORDER — AMLODIPINE BESYLATE 5 MG PO TABS
5.0000 mg | ORAL_TABLET | Freq: Two times a day (BID) | ORAL | Status: DC
  Administered 2021-08-15: 5 mg via ORAL
  Filled 2021-08-14: qty 1

## 2021-08-14 MED ORDER — DEXAMETHASONE SODIUM PHOSPHATE 10 MG/ML IJ SOLN: 8.0000 mg | Freq: Once | INTRAMUSCULAR | Status: DC

## 2021-08-14 MED ORDER — ROSUVASTATIN CALCIUM 10 MG PO TABS
10.0000 mg | ORAL_TABLET | Freq: Every day | ORAL | Status: DC
  Administered 2021-08-14: 10 mg via ORAL
  Filled 2021-08-14: qty 1

## 2021-08-14 MED ORDER — MAGNESIUM HYDROXIDE 400 MG/5ML PO SUSP
30.0000 mL | Freq: Every day | ORAL | Status: DC
  Administered 2021-08-14 – 2021-08-15 (×2): 30 mL via ORAL
  Filled 2021-08-14 (×2): qty 30

## 2021-08-14 MED ORDER — BISACODYL 10 MG RE SUPP: 10.0000 mg | Freq: Every day | RECTAL | Status: DC | PRN

## 2021-08-14 MED ORDER — FENTANYL CITRATE (PF) 100 MCG/2ML IJ SOLN
INTRAMUSCULAR | Status: AC
  Filled 2021-08-14: qty 2

## 2021-08-14 MED ORDER — SODIUM CHLORIDE 0.9 % IV SOLN: INTRAVENOUS | Status: DC

## 2021-08-14 MED ORDER — OXYCODONE HCL 5 MG PO TABS
10.0000 mg | ORAL_TABLET | ORAL | Status: DC | PRN
  Administered 2021-08-14: 10 mg via ORAL
  Filled 2021-08-14: qty 2

## 2021-08-14 MED ORDER — ONDANSETRON HCL 4 MG/2ML IJ SOLN
4.0000 mg | Freq: Four times a day (QID) | INTRAMUSCULAR | Status: DC | PRN
  Administered 2021-08-15: 4 mg via INTRAVENOUS
  Filled 2021-08-14: qty 2

## 2021-08-14 MED ORDER — CEFAZOLIN SODIUM-DEXTROSE 2-4 GM/100ML-% IV SOLN
2.0000 g | Freq: Four times a day (QID) | INTRAVENOUS | Status: AC
  Administered 2021-08-14: 2 g via INTRAVENOUS

## 2021-08-14 MED ORDER — TRAMADOL HCL 50 MG PO TABS: 50.0000 mg | ORAL_TABLET | ORAL | Status: DC | PRN

## 2021-08-14 MED ORDER — FERROUS SULFATE 325 (65 FE) MG PO TABS
325.0000 mg | ORAL_TABLET | Freq: Two times a day (BID) | ORAL | Status: DC
  Administered 2021-08-15 (×2): 325 mg via ORAL
  Filled 2021-08-14 (×2): qty 1

## 2021-08-14 MED ORDER — ONDANSETRON HCL 4 MG PO TABS
4.0000 mg | ORAL_TABLET | Freq: Four times a day (QID) | ORAL | Status: DC | PRN
  Administered 2021-08-14: 4 mg via ORAL
  Filled 2021-08-14: qty 1

## 2021-08-14 MED ORDER — CEFAZOLIN SODIUM-DEXTROSE 2-3 GM-%(50ML) IV SOLR
INTRAVENOUS | Status: DC | PRN
  Administered 2021-08-14: 2 g via INTRAVENOUS

## 2021-08-14 MED ORDER — TRANEXAMIC ACID-NACL 1000-0.7 MG/100ML-% IV SOLN: 1000.0000 mg | INTRAVENOUS | Status: DC

## 2021-08-14 MED ORDER — GABAPENTIN 300 MG PO CAPS
ORAL_CAPSULE | ORAL | Status: AC
  Filled 2021-08-14: qty 1

## 2021-08-14 MED ORDER — PROPOFOL 500 MG/50ML IV EMUL
INTRAVENOUS | Status: DC | PRN
  Administered 2021-08-14: 75 ug/kg/min via INTRAVENOUS

## 2021-08-14 MED ORDER — LACTATED RINGERS IV SOLN: INTRAVENOUS | Status: DC | PRN

## 2021-08-14 MED ORDER — PHENYLEPHRINE HCL-NACL 20-0.9 MG/250ML-% IV SOLN
INTRAVENOUS | Status: DC | PRN
  Administered 2021-08-14: 50 ug/min via INTRAVENOUS

## 2021-08-14 MED ORDER — FAMOTIDINE 20 MG PO TABS: 20.0000 mg | ORAL_TABLET | Freq: Once | ORAL | Status: AC

## 2021-08-14 MED ORDER — CEFAZOLIN SODIUM-DEXTROSE 2-4 GM/100ML-% IV SOLN: 2.0000 g | INTRAVENOUS | Status: DC

## 2021-08-14 MED ORDER — EPHEDRINE SULFATE (PRESSORS) 50 MG/ML IJ SOLN
INTRAMUSCULAR | Status: DC | PRN
  Administered 2021-08-14: 7.5 mg via INTRAVENOUS

## 2021-08-14 MED ORDER — APIXABAN 5 MG PO TABS
5.0000 mg | ORAL_TABLET | Freq: Two times a day (BID) | ORAL | Status: DC
  Administered 2021-08-15: 5 mg via ORAL
  Filled 2021-08-14: qty 1

## 2021-08-14 MED ORDER — BUPIVACAINE HCL (PF) 0.5 % IJ SOLN
INTRAMUSCULAR | Status: DC | PRN
  Administered 2021-08-14: 3 mL

## 2021-08-14 MED ORDER — CHLORHEXIDINE GLUCONATE 0.12 % MT SOLN
OROMUCOSAL | Status: AC
  Administered 2021-08-14: 15 mL via OROMUCOSAL
  Filled 2021-08-14: qty 15

## 2021-08-14 MED ORDER — TRANEXAMIC ACID-NACL 1000-0.7 MG/100ML-% IV SOLN
INTRAVENOUS | Status: AC
  Filled 2021-08-14: qty 100

## 2021-08-14 SURGICAL SUPPLY — 70 items
ATTUNE MED DOME PAT 32 KNEE (Knees) ×2 IMPLANT
ATTUNE PSFEM RTSZ4 NARCEM KNEE (Femur) ×2 IMPLANT
ATTUNE PSRP INSR SZ4 5 KNEE (Insert) ×2 IMPLANT
BATTERY INSTRU NAVIGATION (MISCELLANEOUS) ×8
BLADE SAW 70X12.5 (BLADE) ×2
BLADE SAW 90X13X1.19 OSCILLAT (BLADE) ×2
BLADE SAW 90X25X1.19 OSCILLAT (BLADE) ×2
BONE CEMENT GENTAMICIN (Cement) ×4 IMPLANT
COOLER POLAR GLACIER W/PUMP (MISCELLANEOUS) ×2
CUFF TOURN SGL QUICK 24 (TOURNIQUET CUFF)
CUFF TOURN SGL QUICK 34 (TOURNIQUET CUFF)
CUFF TRNQT CYL 24X4X16.5-23 (TOURNIQUET CUFF)
CUFF TRNQT CYL 34X4.125X (TOURNIQUET CUFF)
DRAPE 3/4 80X56 (DRAPES) ×2
DRAPE INCISE IOBAN 66X45 STRL (DRAPES) ×2
DRSG DERMACEA 8X12 NADH (GAUZE/BANDAGES/DRESSINGS) ×2
DRSG MEPILEX SACRM 8.7X9.8 (GAUZE/BANDAGES/DRESSINGS) ×2
DRSG OPSITE POSTOP 4X14 (GAUZE/BANDAGES/DRESSINGS) ×2
DRSG TEGADERM 4X4.75 (GAUZE/BANDAGES/DRESSINGS) ×2
DURAPREP 26ML APPLICATOR (WOUND CARE) ×4
ELECT CAUTERY BLADE 6.4 (BLADE) ×2
ELECT REM PT RETURN 9FT ADLT (ELECTROSURGICAL) ×2
EX-PIN ORTHOLOCK NAV 4X150 (PIN) ×4
GLOVE SRG 8 PF TXTR STRL LF DI (GLOVE) ×1
GLOVE SURG ENC TEXT LTX SZ7.5 (GLOVE) ×8
GLOVE SURG UNDER POLY LF SZ7.5 (GLOVE) ×2
GLOVE SURG UNDER POLY LF SZ8 (GLOVE) ×1
GOWN STRL REUS W/ TWL LRG LVL3 (GOWN DISPOSABLE) ×2
GOWN STRL REUS W/ TWL XL LVL3 (GOWN DISPOSABLE) ×1
GOWN STRL REUS W/TWL LRG LVL3 (GOWN DISPOSABLE) ×2
GOWN STRL REUS W/TWL XL LVL3 (GOWN DISPOSABLE) ×1
HEMOVAC 400CC 10FR (MISCELLANEOUS) ×2
HOLDER FOLEY CATH W/STRAP (MISCELLANEOUS) ×2
HOLSTER ELECTROSUGICAL PENCIL (MISCELLANEOUS) ×2
IV NS IRRIG 3000ML ARTHROMATIC (IV SOLUTION) ×2
KIT TURNOVER KIT A (KITS) ×2
KNIFE SCULPS 14X20 (INSTRUMENTS) ×2
LABEL OR SOLS (LABEL)
MANIFOLD NEPTUNE II (INSTRUMENTS) ×4
NDL SAFETY ECLIPSE 18X1.5 (NEEDLE)
NDL SPNL 20GX3.5 QUINCKE YW (NEEDLE) ×2 IMPLANT
NEEDLE HYPO 18GX1.5 SHARP (NEEDLE)
NEEDLE SPNL 20GX3.5 QUINCKE YW (NEEDLE) ×4
NS IRRIG 500ML POUR BTL (IV SOLUTION) ×2
PACK TOTAL KNEE (MISCELLANEOUS) ×2
PAD ABD DERMACEA PRESS 5X9 (GAUZE/BANDAGES/DRESSINGS) ×4
PENCIL SMOKE EVACUATOR COATED (MISCELLANEOUS) ×2
PIN DRILL FIX HALF THREAD (BIT) ×4
PIN FIXATION 1/8DIA X 3INL (PIN) ×2
PULSAVAC PLUS IRRIG FAN TIP (DISPOSABLE) ×2
SOL PREP PVP 2OZ (MISCELLANEOUS) ×2
SOLUTION PRONTOSAN WOUND 350ML (IRRIGATION / IRRIGATOR) ×2
SPONGE DRAIN TRACH 4X4 STRL 2S (GAUZE/BANDAGES/DRESSINGS) ×2
STAPLER SKIN PROX 35W (STAPLE) ×2
STOCKINETTE IMPERV 14X48 (MISCELLANEOUS)
STRAP TIBIA SHORT (MISCELLANEOUS) ×2
SUCTION FRAZIER HANDLE 10FR (MISCELLANEOUS) ×1
SUCTION TUBE FRAZIER 10FR DISP (MISCELLANEOUS) ×1
SUT VIC AB 0 CT1 36 (SUTURE) ×4
SUT VIC AB 1 CT1 36 (SUTURE) ×4
SUT VIC AB 2-0 CT2 27 (SUTURE) ×2
SYR 20ML LL LF (SYRINGE) ×2
SYR 30ML LL (SYRINGE) ×4
TIBIAL BASE ROT PLAT SZ 3 KNEE (Knees) ×2 IMPLANT
TOWEL OR 17X26 4PK STRL BLUE (TOWEL DISPOSABLE)
TOWER CARTRIDGE SMART MIX (DISPOSABLE) ×2
TRAY FOLEY MTR SLVR 16FR STAT (SET/KITS/TRAYS/PACK) ×2
WATER STERILE IRR 1000ML POUR (IV SOLUTION) ×2
WATER STERILE IRR 500ML POUR (IV SOLUTION)
WRAPON POLAR PAD KNEE (MISCELLANEOUS) ×2

## 2021-08-14 NOTE — Anesthesia Procedure Notes (Signed)
Spinal  Patient location during procedure: OR Start time: 08/14/2021 11:30 AM End time: 08/14/2021 11:30 AM Reason for block: surgical anesthesia Staffing Resident/CRNA: Natasha Mead, CRNA Preanesthetic Checklist Completed: patient identified, IV checked, site marked, risks and benefits discussed, surgical consent, monitors and equipment checked, pre-op evaluation and timeout performed Spinal Block Patient position: sitting Prep: DuraPrep Patient monitoring: heart rate, cardiac monitor, continuous pulse ox and blood pressure Approach: midline Location: L3-4 Injection technique: single-shot Needle Needle type: Pencan  Needle gauge: 24 G Needle length: 9 cm Assessment Sensory level: T6 Events: CSF return

## 2021-08-14 NOTE — Anesthesia Preprocedure Evaluation (Signed)
Anesthesia Evaluation  Patient identified by MRN, date of birth, ID band Patient awake    Reviewed: Allergy & Precautions, NPO status , Patient's Chart, lab work & pertinent test results  History of Anesthesia Complications Negative for: history of anesthetic complications  Airway Mallampati: III   Neck ROM: Full    Dental no notable dental hx.    Pulmonary asthma , sleep apnea and Continuous Positive Airway Pressure Ventilation ,    Pulmonary exam normal breath sounds clear to auscultation       Cardiovascular hypertension, Normal cardiovascular exam+ dysrhythmias (a fib on Eliquis, last dose 08/10/21)  Rhythm:Regular Rate:Normal  ECG 07/31/21:  Normal sinus rhythm Nonspecific T wave abnormality   Neuro/Psych Vertigo  TIA   GI/Hepatic negative GI ROS,   Endo/Other  negative endocrine ROS  Renal/GU negative Renal ROS     Musculoskeletal   Abdominal   Peds  Hematology negative hematology ROS (+)   Anesthesia Other Findings   Reproductive/Obstetrics                             Anesthesia Physical Anesthesia Plan  ASA: 3  Anesthesia Plan: General and Spinal   Post-op Pain Management:    Induction: Intravenous  PONV Risk Score and Plan: 3 and Propofol infusion, TIVA, Treatment may vary due to age or medical condition and Ondansetron  Airway Management Planned: Natural Airway and Nasal Cannula  Additional Equipment:   Intra-op Plan:   Post-operative Plan:   Informed Consent: I have reviewed the patients History and Physical, chart, labs and discussed the procedure including the risks, benefits and alternatives for the proposed anesthesia with the patient or authorized representative who has indicated his/her understanding and acceptance.       Plan Discussed with: CRNA  Anesthesia Plan Comments: (Plan for spinal and GA with natural airway, LMA/GETA backup.  Patient consented  for risks of anesthesia including but not limited to:  - adverse reactions to medications - damage to eyes, teeth, lips or other oral mucosa - nerve damage due to positioning  - sore throat or hoarseness - headache, bleeding, infection, nerve damage 2/2 spinal - damage to heart, brain, nerves, lungs, other parts of body or loss of life  Informed patient about role of CRNA in peri- and intra-operative care.  Patient voiced understanding.)        Anesthesia Quick Evaluation

## 2021-08-14 NOTE — Anesthesia Postprocedure Evaluation (Signed)
Anesthesia Post Note  Patient: BREYONNA NAULT  Procedure(s) Performed: COMPUTER ASSISTED TOTAL KNEE ARTHROPLASTY - RNFA (Right: Knee)  Patient location during evaluation: PACU Anesthesia Type: Spinal and General Level of consciousness: awake and alert, oriented and patient cooperative Pain management: pain level controlled Vital Signs Assessment: post-procedure vital signs reviewed and stable Respiratory status: spontaneous breathing, nonlabored ventilation and respiratory function stable Cardiovascular status: blood pressure returned to baseline and stable Postop Assessment: adequate PO intake, no headache and spinal receding Anesthetic complications: no   No notable events documented.   Last Vitals:  Vitals:   08/14/21 1545 08/14/21 1600  BP: (!) 98/52 (!) 95/53  Pulse: 70 60  Resp: 15 15  Temp:    SpO2: 100% 100%    Last Pain:  Vitals:   08/14/21 1019  TempSrc: Oral        RLE Motor Response: No movement to painful stimulus (08/14/21 1600) RLE Sensation: No sensation (absent) (08/14/21 1600)      Darrin Nipper

## 2021-08-14 NOTE — Progress Notes (Signed)
PT Cancellation Note  Patient Details Name: [name] MRN: [id number] DOB: Aug 04, 1940   Cancelled Treatment:    Reason Eval/Treat Not Completed: Fatigue/lethargy limiting ability to participate. Per conversation with nursing patient remains too lethargic to participate with PT services.  Will attempt to see pt at a future date/time as medically appropriate.    Linus Salmons PT, DPT 08/14/21, 4:04 PM

## 2021-08-14 NOTE — Transfer of Care (Signed)
Immediate Anesthesia Transfer of Care Note  Patient: [name]  Procedure(s) Performed: COMPUTER ASSISTED TOTAL KNEE ARTHROPLASTY - RNFA (Right: Knee)  Patient Location: PACU  Anesthesia Type:Spinal  Level of Consciousness: awake, alert  and oriented  Airway & Oxygen Therapy: Patient Spontanous Breathing and Patient connected to nasal cannula oxygen  Post-op Assessment: Report given to RN and Post -op Vital signs reviewed and stable  Post vital signs: stable  Last Vitals:  Vitals Value Taken Time  BP 105/61 08/14/21 1503  Temp    Pulse 84 08/14/21 1507  Resp 16 08/14/21 1507  SpO2 98 % 08/14/21 1507  Vitals shown include unvalidated device data.  Last Pain:  Vitals:   08/14/21 1019  TempSrc: Oral         Complications: No notable events documented.

## 2021-08-14 NOTE — H&P (Signed)
The patient has been re-examined, and the chart reviewed, and there have been no interval changes to the documented history and physical.    The risks, benefits, and alternatives have been discussed at length. The patient expressed understanding of the risks benefits and agreed with plans for surgical intervention.  Yevonne Yokum P. Angeleigh Chiasson, Jr. M.D.    

## 2021-08-14 NOTE — Op Note (Signed)
OPERATIVE NOTE  DATE OF SURGERY:  08/14/2021  PATIENT NAME:  [name]   DOB: 29-Apr-1941  MRN: [id number]  PRE-OPERATIVE DIAGNOSIS: Degenerative arthrosis of the right knee, primary  POST-OPERATIVE DIAGNOSIS:  Same  PROCEDURE:  Right total knee arthroplasty using computer-assisted navigation  SURGEON:  Marciano Sequin. M.D.  ANESTHESIA: spinal  ESTIMATED BLOOD LOSS: 50 mL  FLUIDS REPLACED: 1300 mL of crystalloid  TOURNIQUET TIME: 90 minutes  DRAINS: 2 medium Hemovac drains  SOFT TISSUE RELEASES: Anterior cruciate ligament, posterior cruciate ligament, deep medial collateral ligament, patellofemoral ligament  IMPLANTS UTILIZED: DePuy Attune size 4N posterior stabilized femoral component (cemented), size 3 rotating platform tibial component (cemented), 32 mm medialized dome patella (cemented), and a 5 mm stabilized rotating platform polyethylene insert.  INDICATIONS FOR SURGERY: [name] is a 81 y.o. year old female with a long history of progressive knee pain. X-rays demonstrated severe degenerative changes in tricompartmental fashion. The patient had not seen any significant improvement despite conservative nonsurgical intervention. After discussion of the risks and benefits of surgical intervention, the patient expressed understanding of the risks benefits and agree with plans for total knee arthroplasty.   The risks, benefits, and alternatives were discussed at length including but not limited to the risks of infection, bleeding, nerve injury, stiffness, blood clots, the need for revision surgery, cardiopulmonary complications, among others, and they were willing to proceed.  PROCEDURE IN DETAIL: The patient was brought into the operating room and, after adequate spinal anesthesia was achieved, a tourniquet was placed on the patient's upper thigh. The patient's knee and leg were cleaned and prepped with alcohol and DuraPrep and draped in the usual sterile fashion.  A "timeout" was performed as per usual protocol. The lower extremity was exsanguinated using an Esmarch, and the tourniquet was inflated to 300 mmHg. An anterior longitudinal incision was made followed by a standard mid vastus approach. The deep fibers of the medial collateral ligament were elevated in a subperiosteal fashion off of the medial flare of the tibia so as to maintain a continuous soft tissue sleeve. The patella was subluxed laterally and the patellofemoral ligament was incised. Inspection of the knee demonstrated severe degenerative changes with full-thickness loss of articular cartilage. Osteophytes were debrided using a rongeur. Anterior and posterior cruciate ligaments were excised. Two 4.0 mm Schanz pins were inserted in the femur and into the tibia for attachment of the array of trackers used for computer-assisted navigation. Hip center was identified using a circumduction technique. Distal landmarks were mapped using the computer. The distal femur and proximal tibia were mapped using the computer. The distal femoral cutting guide was positioned using computer-assisted navigation so as to achieve a 5 distal valgus cut. The femur was sized and it was felt that a size 4N femoral component was appropriate. A size 4 femoral cutting guide was positioned and the anterior cut was performed and verified using the computer. This was followed by completion of the posterior and chamfer cuts. Femoral cutting guide for the central box was then positioned in the center box cut was performed.  Attention was then directed to the proximal tibia. Medial and lateral menisci were excised. The extramedullary tibial cutting guide was positioned using computer-assisted navigation so as to achieve a 0 varus-valgus alignment and 3 posterior slope. The cut was performed and verified using the computer. The proximal tibia was sized and it was felt that a size 3 tibial tray was appropriate. Tibial and femoral trials were  inserted followed  by insertion of a 5 mm polyethylene insert. This allowed for excellent mediolateral soft tissue balancing both in flexion and in full extension. Finally, the patella was cut and prepared so as to accommodate a 32 mm medialized dome patella. A patella trial was placed and the knee was placed through a range of motion with excellent patellar tracking appreciated. The femoral trial was removed after debridement of posterior osteophytes. The central post-hole for the tibial component was reamed followed by insertion of a keel punch. Tibial trials were then removed. Cut surfaces of bone were irrigated with copious amounts of normal saline using pulsatile lavage and then suctioned dry. Polymethylmethacrylate cement with gentamicin was prepared in the usual fashion using a vacuum mixer. Cement was applied to the cut surface of the proximal tibia as well as along the undersurface of a size 3 rotating platform tibial component. Tibial component was positioned and impacted into place. Excess cement was removed using [employer] fast streamer. Cement was then applied to the cut surfaces of the femur as well as along the posterior flanges of the size 4N femoral component. The femoral component was positioned and impacted into place. Excess cement was removed using [employer] fast streamer. A 5 mm polyethylene trial was inserted and the knee was brought into full extension with steady axial compression applied. Finally, cement was applied to the backside of a 32 mm medialized dome patella and the patellar component was positioned and patellar clamp applied. Excess cement was removed using [employer] fast streamer. After adequate curing of the cement, the tourniquet was deflated after a total tourniquet time of 90 minutes. Hemostasis was achieved using electrocautery. The knee was irrigated with copious amounts of normal saline using pulsatile lavage followed by 350 ml of Prontosan and then suctioned dry. 20 mL of 1.3% Exparel and 60 mL of  0.25% Marcaine in 40 mL of normal saline was injected along the posterior capsule, medial and lateral gutters, and along the arthrotomy site. A 5 mm stabilized rotating platform polyethylene insert was inserted and the knee was placed through a range of motion with excellent mediolateral soft tissue balancing appreciated and excellent patellar tracking noted. 2 medium drains were placed in the wound bed and brought out through separate stab incisions. The medial parapatellar portion of the incision was reapproximated using interrupted sutures of #1 Vicryl. Subcutaneous tissue was approximated in layers using first #0 Vicryl followed #2-0 Vicryl. The skin was approximated with skin staples. A sterile dressing was applied.  The patient tolerated the procedure well and was transported to the recovery room in stable condition.    Simmone Cape P. Holley Bouche., M.D.

## 2021-08-15 DIAGNOSIS — M1711 Unilateral primary osteoarthritis, right knee: Secondary | ICD-10-CM | POA: Diagnosis not present

## 2021-08-15 MED ORDER — TRAMADOL HCL 50 MG PO TABS: 50.0000 mg | ORAL_TABLET | ORAL | 0 refills | Status: DC | PRN

## 2021-08-15 MED ORDER — CELECOXIB 200 MG PO CAPS: 200.0000 mg | ORAL_CAPSULE | Freq: Two times a day (BID) | ORAL | 1 refills | Status: DC

## 2021-08-15 MED ORDER — SCOPOLAMINE 1 MG/3DAYS TD PT72
1.0000 | MEDICATED_PATCH | TRANSDERMAL | Status: DC
  Administered 2021-08-15: 1.5 mg via TRANSDERMAL
  Filled 2021-08-15: qty 1

## 2021-08-15 MED ORDER — OXYCODONE HCL 5 MG PO TABS: 5.0000 mg | ORAL_TABLET | ORAL | 0 refills | Status: DC | PRN

## 2021-08-15 NOTE — Evaluation (Signed)
Occupational Therapy Evaluation Patient Details Name: [name] MRN: [id number] DOB: [birth year] Today's Date: 08/15/2021   History of Present Illness Patient is a 82 year old female who underwent R total knee arthroplasty on 08/14/21. She additionally has history of R knee arthroscopy, partial medial meniscectomy, and chondroplasty on 10/08/20. She had severe pain and swelling 2 weeks following procedure. Debridement and arthroscopic irrigation on 10/21/20. Additional pain in left leg noted. Lumbar MRI on 05/16/21 showed mulitlevel facet arthropathy severe on L4-5 left and bilaterally L5-S1. PMH includes a fib, HLD, HTN, stroke, and TIA.   Clinical Impression   Pt seen for OT evaluation this date.  Pt had difficulty with N/V this morning during PT session.  Nausea still present but improved, and pt tolerated OT session without vomiting.  Pt had been sitting up in the chair since PT eval and requested return to bed.  Pt performed sit to stand transfer with min guard from recliner.  Tolerated standing for BP check with no drop in pressure, WNL.  Performed step pivot to bed with min guard, and min A to lift R leg up from sitting to supine.  OT educated pt on benefits of 3in1 for home, and it's 3 uses for bedside, over the toilet, and use as a shower chair.  3in1 will be needed at d/c.  Pt will continue to benefit from skilled OT in the acute setting to reinforce ADL safety/fall prevention, and maximize ADL transfers.  Pt left in bed with alarm set and all items within reach.  Would benefit from [facility] OT upon d/c as pt lives alone.       Recommendations for follow up therapy are one component of a multi-disciplinary discharge planning process, led by the attending physician.  Recommendations may be updated based on patient status, additional functional criteria and insurance authorization.   Follow Up Recommendations  Home health OT    Assistance Recommended at Discharge Intermittent  Supervision/Assistance  Patient can return home with the following A little help with walking and/or transfers;A little help with bathing/dressing/bathroom;Assistance with cooking/housework;Assist for transportation;Help with stairs or ramp for entrance    Functional Status Assessment  Patient has had a recent decline in their functional status and demonstrates the ability to make significant improvements in function in a reasonable and predictable amount of time.  Equipment Recommendations  BSC/3in1           Precautions / Restrictions Precautions Precautions: Knee Restrictions Weight Bearing Restrictions: Yes RLE Weight Bearing: Weight bearing as tolerated      Mobility Bed Mobility Overal bed mobility: Needs Assistance Bed Mobility: Sit to Supine       Sit to supine: Min assist   General bed mobility comments: min A to lift RLE into bed Patient Response: Cooperative  Transfers Overall transfer level: Needs assistance Equipment used: Rolling walker (2 wheels) Transfers: Sit to/from Stand Sit to Stand: Min guard           General transfer comment: min guard to transfer sit to stand from recliner      Balance Overall balance assessment: Needs assistance Sitting-balance support: Feet supported, No upper extremity supported Sitting balance-Leahy Scale: Good     Standing balance support: Bilateral upper extremity supported Standing balance-Leahy Scale: Fair Standing balance comment: min UE support on walker with min guard from OT.  Tolerated standing long enough to take BP in standing d/t reported dizziness, and BP WNL without a drop from seated position  ADL either performed or assessed with clinical judgement   ADL Overall ADL's : Needs assistance/impaired                 Upper Body Dressing : Set up   Lower Body Dressing: Minimal assistance   Toilet Transfer: Min guard           Functional mobility during  ADLs: Min guard;Rolling walker (2 wheels) General ADL Comments: min guard with RW, limited by dizziness and nausea     Vision Patient Visual Report: No change from baseline                  Pertinent Vitals/Pain Pain Assessment Pain Score: 3  Pain Location: R knee Pain Descriptors / Indicators: Sore, Aching Pain Intervention(s): Monitored during session, Repositioned, Ice applied     Hand Dominance Right   Extremity/Trunk Assessment Upper Extremity Assessment Upper Extremity Assessment: Overall WFL for tasks assessed   Lower Extremity Assessment Lower Extremity Assessment: Defer to PT evaluation RLE Deficits / Details: unable to test fully due to surgical limitations RLE Sensation: decreased light touch LLE Deficits / Details: grossly 4/5 LLE Sensation: WNL LLE Coordination: WNL   Cervical / Trunk Assessment Cervical / Trunk Assessment: Normal   Communication Communication Communication: No difficulties   Cognition Arousal/Alertness: Awake/alert Behavior During Therapy: WFL for tasks assessed/performed Overall Cognitive Status: Within Functional Limits for tasks assessed                                 General Comments: Patient A and O x 4, limited session due nausea     General Comments  post surgical dressing R knee               Home Living Family/patient expects to be discharged to:: Private residence Living Arrangements: Alone Available Help at Discharge: Family;Available PRN/intermittently Type of Home: House Home Access: Ramped entrance     Home Layout: One level     Bathroom Shower/Tub: Occupational psychologist: Standard     Home Equipment: Kasandra Knudsen - single point   Additional Comments: Patient lives alone. Has a small ramp to enter her house that her neighbor built.      Prior Functioning/Environment Prior Level of Function : Independent/Modified Independent             Mobility Comments: Patient utilizes  SPC at baseline. Is independent at baseline. ADLs Comments: indep at baseline        OT Problem List: Decreased knowledge of use of DME or AE;Impaired balance (sitting and/or standing);Pain      OT Treatment/Interventions: Self-care/ADL training;Patient/family education;Therapeutic exercise;Balance training;Therapeutic activities;DME and/or AE instruction    OT Goals(Current goals can be found in the care plan section) Acute Rehab OT Goals Patient Stated Goal: To go home OT Goal Formulation: With patient Time For Goal Achievement: 08/29/21 Potential to Achieve Goals: Good  OT Frequency: Min 2X/week                  AM-PAC OT "6 Clicks" Daily Activity     Outcome Measure Help from another person eating meals?: None Help from another person taking care of personal grooming?: None Help from another person toileting, which includes using toliet, bedpan, or urinal?: A Little Help from another person bathing (including washing, rinsing, drying)?: A Little Help from another person to put on and taking off regular upper body clothing?: None Help from  another person to put on and taking off regular lower body clothing?: A Little 6 Click Score: 21   End of Session Equipment Utilized During Treatment: Gait belt;Rolling walker (2 wheels) Nurse Communication: Other (comment) (student nurse present for vitals and witnessed mobility and pt back to bed)  Activity Tolerance: Other (comment) (limited by nausea and dizziness) Patient left:    OT Visit Diagnosis: Other abnormalities of gait and mobility (R26.89);Pain Pain - Right/Left: Right Pain - part of body: Knee                Time: 1101-1126 OT Time Calculation (min): 25 min Charges:  OT General Charges $OT Visit: 1 Visit OT Evaluation $OT Eval Low Complexity: 1 Low  Kristin Speller, MS, OTR/L   Darleene Cleaver 08/15/2021, 11:43 AM

## 2021-08-15 NOTE — Progress Notes (Signed)
Vitals entered manually- dynamap didn't send over

## 2021-08-15 NOTE — Progress Notes (Signed)
Physical Therapy Treatment Patient Details Name: [name] MRN: [id number] DOB: 10/05/1940 Today's Date: 08/15/2021   History of Present Illness Patient is a 81 year old female who underwent R total knee arthroplasty on 08/14/21. She additionally has history of R knee arthroscopy, partial medial meniscectomy, and chondroplasty on 10/08/20. She had severe pain and swelling 2 weeks following procedure. Debridement and arthroscopic irrigation on 10/21/20. Additional pain in left leg noted. Lumbar MRI on 05/16/21 showed mulitlevel facet arthropathy severe on L4-5 left and bilaterally L5-S1. PMH includes a fib, HLD, HTN, stroke, and TIA.    PT Comments    Patient is in bed upon PT arrival, patient is agreeable to participate in session. She is able to transition EOB with supervision only, STS with RW with supervision/CGA. Ambulation is slow but patient is able to tolerates increased duration and weight shift onto post surgical limb. Patient returned to bed after performing exercises and had needs met. Updated D/C recommendation to HHPT with an aide due to patient progression and independence.     Recommendations for follow up therapy are one component of a multi-disciplinary discharge planning process, led by the attending physician.  Recommendations may be updated based on patient status, additional functional criteria and insurance authorization.  Follow Up Recommendations  Home health PT     Assistance Recommended at Discharge Intermittent Supervision/Assistance  Patient can return home with the following Assistance with cooking/housework;Help with stairs or ramp for entrance;Assist for transportation;A little help with walking and/or transfers;A little help with bathing/dressing/bathroom   Equipment Recommendations  Rolling walker (2 wheels);BSC/3in1    Recommendations for Other Services       Precautions / Restrictions Precautions Precautions: Knee Restrictions Weight Bearing  Restrictions: Yes RLE Weight Bearing: Weight bearing as tolerated     Mobility  Bed Mobility Overal bed mobility: Needs Assistance Bed Mobility: Supine to Sit, Sit to Supine     Supine to sit: Supervision Sit to supine: Supervision   General bed mobility comments: able to perform without physical assist    Transfers Overall transfer level: Needs assistance Equipment used: Rolling walker (2 wheels) Transfers: Sit to/from Stand Sit to Stand: Min guard           General transfer comment: Patient required mod A first trial and min A second sit to stand trial.    Ambulation/Gait Ambulation/Gait assistance: Min guard Gait Distance (Feet): 75 Feet Assistive device: Rolling walker (2 wheels) Gait Pattern/deviations: Step-to pattern, Decreased stance time - right, Trunk flexed, Narrow base of support Gait velocity: decreased     General Gait Details: Patient ambulation limited by patient throwing up   Stairs             Wheelchair Mobility    Modified Rankin (Stroke Patients Only)       Balance Overall balance assessment: Needs assistance Sitting-balance support: Feet supported, No upper extremity supported Sitting balance-Leahy Scale: Good Sitting balance - Comments: able to static sit without assistance   Standing balance support: Bilateral upper extremity supported, During functional activity, Reliant on assistive device for balance Standing balance-Leahy Scale: Fair Standing balance comment: able to static stand without UE support for 30 seconds x2 trials                            Cognition Arousal/Alertness: Awake/alert Behavior During Therapy: WFL for tasks assessed/performed Overall Cognitive Status: Within Functional Limits for tasks assessed  General Comments: Patient A and O x 4,        Exercises Total Joint Exercises Ankle Circles/Pumps: Strengthening, Both, 10 reps, Supine Quad  Sets: Strengthening, Right, 10 reps, Supine Heel Slides: Strengthening, AROM, Right, Supine, 10 reps Hip ABduction/ADduction: AROM, Strengthening, Right, 5 reps, Supine Straight Leg Raises: Strengthening, Right, 5 reps, Supine Long Arc Quad: Strengthening, Both, 10 reps, Seated Knee Flexion: AROM, Right, 10 reps Goniometric ROM: flexion 73 extension -5 Other Exercises Other Exercises: Patient educated on safe transfers and mobility    General Comments General comments (skin integrity, edema, etc.): post surgical dressing R knee      Pertinent Vitals/Pain Pain Assessment Pain Assessment: 0-10 Pain Score: 3  Pain Location: R knee Pain Descriptors / Indicators: Sore, Aching Pain Intervention(s): Limited activity within patient's tolerance, Monitored during session, Repositioned, Ice applied    Home Living Family/patient expects to be discharged to:: Private residence Living Arrangements: Alone Available Help at Discharge: Family;Available PRN/intermittently Type of Home: House Home Access: Ramped entrance       Home Layout: One level Home Equipment: Kasandra Knudsen - single point Additional Comments: Patient lives alone. Has a small ramp to enter her house that her neighbor built.    Prior Function            PT Goals (current goals can now be found in the care plan section) Acute Rehab PT Goals Patient Stated Goal: to walk again PT Goal Formulation: With patient Time For Goal Achievement: 08/29/21 Potential to Achieve Goals: Fair Progress towards PT goals: Progressing toward goals    Frequency    BID      PT Plan Discharge plan needs to be updated (Now appropriate for HHPT with an aide)    Co-evaluation              AM-PAC PT "6 Clicks" Mobility   Outcome Measure  Help needed turning from your back to your side while in a flat bed without using bedrails?: None Help needed moving from lying on your back to sitting on the side of a flat bed without using  bedrails?: A Little Help needed moving to and from a bed to a chair (including a wheelchair)?: A Little Help needed standing up from a chair using your arms (e.g., wheelchair or bedside chair)?: A Little Help needed to walk in hospital room?: A Little Help needed climbing 3-5 steps with a railing? : A Lot 6 Click Score: 18    End of Session Equipment Utilized During Treatment: Gait belt Activity Tolerance: Patient tolerated treatment well Patient left: in bed;with call bell/phone within reach;with bed alarm set Nurse Communication: Mobility status (change in POC recommendation) PT Visit Diagnosis: Unsteadiness on feet (R26.81);Other abnormalities of gait and mobility (R26.89);Muscle weakness (generalized) (M62.81);Difficulty in walking, not elsewhere classified (R26.2);Pain Pain - Right/Left: Right Pain - part of body: Knee     Time: 4270-6237 PT Time Calculation (min) (ACUTE ONLY): 19 min  Charges:  $Therapeutic Exercise: 8-22 mins                    Janna Arch, PT, DPT  08/15/2021, 1:48 PM

## 2021-08-15 NOTE — Plan of Care (Signed)
Patient discharged home per MD orders at this time.All discharge instructions, education and medications reviewed with the patient.Pt expressed understanding and will comply with dc instructions.follow up appts was also communicated to the patient.Pt was discharged home with HH/PT services per order.Pt was transported home by daughter in a privately owned vehicle.

## 2021-08-15 NOTE — Discharge Summary (Incomplete Revision)
Physician Discharge Summary  Subjective: 1 Day Post-Op Procedure(s) (LRB): COMPUTER ASSISTED TOTAL KNEE ARTHROPLASTY - RNFA (Right) Patient reports pain as mild.   Patient seen in rounds with Dr. Rudene Christians. Patient is well, but has had some minor complaints of nausea Patient is ready to go home after physical therapy.  Physician Discharge Summary  [name] MRN: [id number] DOB/AGE: 02-18-1941 81 y.o.  Admit date: 08/14/2021 Discharge date: ***  Admission Diagnoses:  Discharge Diagnoses:  Principal Problem:   Total knee replacement status   Discharged Condition: fair  Hospital Course: The patient is postop day 1 from a right total knee arthroplasty.  She is doing well since surgery.  She has had some mild nausea.  Her pain is well controlled.  She had her bandage and drain removed this morning.  She will be doing physical therapy before going home.  Treatments: surgery:  Right total knee arthroplasty using computer-assisted navigation   SURGEON:  Marciano Sequin. M.D.   ANESTHESIA: spinal   ESTIMATED BLOOD LOSS: 50 mL   FLUIDS REPLACED: 1300 mL of crystalloid   TOURNIQUET TIME: 90 minutes   DRAINS: 2 medium Hemovac drains   SOFT TISSUE RELEASES: Anterior cruciate ligament, posterior cruciate ligament, deep medial collateral ligament, patellofemoral ligament   IMPLANTS UTILIZED: DePuy Attune size 4N posterior stabilized femoral component (cemented), size 3 rotating platform tibial component (cemented), 32 mm medialized dome patella (cemented), and a 5 mm stabilized rotating platform polyethylene insert.    Discharge Exam: Blood pressure 138/78, pulse 66, temperature 97.8 F (36.6 C), resp. rate 20, height 5\' 3"  (1.6 m), weight 72.6 kg, SpO2 99 %.   Disposition: Discharge disposition: 01-Home or Self Care        Allergies as of 08/15/2021       Reactions   Ace Inhibitors Swelling   Patient states her tongue swell.   Penicillins Anxiety    IgE = 251 (WNL) on 07/31/2021 Has patient had a PCN reaction causing immediate rash, facial/tongue/throat swelling, SOB or lightheadedness with hypotension: No Has patient had a PCN reaction causing severe rash involving mucus membranes or skin necrosis: No Has patient had a PCN reaction that required hospitalization No Has patient had a PCN reaction occurring within the last 10 years: No If all of the above answers are "NO", then may proceed with Cephalosporin use.        Medication List     TAKE these medications    acetaminophen 650 MG CR tablet Commonly known as: TYLENOL Take 650 mg by mouth every 8 (eight) hours as needed for pain.   albuterol 108 (90 Base) MCG/ACT inhaler Commonly known as: VENTOLIN HFA INHALE 2 PUFFS INTO THE LUNGS EVERY 6 HOURS AS NEEDED FOR WHEEZING OR SHORTNESS OF BREATH   amLODipine 2.5 MG tablet Commonly known as: NORVASC Take 3 tablets (7.5 mg total) by mouth daily. Take 2 in am and one in pm if bp is below 115/75 What changed:  how much to take when to take this   CALCIUM 1200+D3 PO Take 1 tablet by mouth 2 (two) times a week. 1200 mg calcium / 1000 units Vitamin D3   celecoxib 200 MG capsule Commonly known as: CELEBREX Take 1 capsule (200 mg total) by mouth 2 (two) times daily.   diltiazem 30 MG tablet Commonly known as: Cardizem Take 1 tablet (30 mg total) by mouth every 6 (six) hours as needed (for tachycardia/recurrent Afib.).   Eliquis 5 MG Tabs tablet Generic drug:  apixaban TAKE 1 TABLET(5 MG) BY MOUTH TWICE DAILY   meclizine 25 MG tablet Commonly known as: ANTIVERT Take 25 mg by mouth 3 (three) times daily as needed (vertigo/dizziness).   oxyCODONE 5 MG immediate release tablet Commonly known as: Oxy IR/ROXICODONE Take 1 tablet (5 mg total) by mouth every 4 (four) hours as needed for moderate pain (pain score 4-6).   potassium chloride 10 MEQ tablet Commonly known as: KLOR-CON Take 1 tablet (10 mEq total) by mouth daily for 14  days.   rosuvastatin 10 MG tablet Commonly known as: CRESTOR TAKE 1 TABLET(10 MG) BY MOUTH DAILY What changed: See the new instructions.   traMADol 50 MG tablet Commonly known as: ULTRAM Take 1-2 tablets (50-100 mg total) by mouth every 4 (four) hours as needed for moderate pain.               Durable Medical Equipment  (From admission, onward)           Start     Ordered   08/14/21 1843  DME Walker rolling  Once       Question:  Patient needs a walker to treat with the following condition  Answer:  Total knee replacement status   08/14/21 1842   08/14/21 1843  DME Bedside commode  Once       Question:  Patient needs a bedside commode to treat with the following condition  Answer:  Total knee replacement status   08/14/21 1842            Follow-up Information     Fausto Skillern, PA-C Follow up on 08/28/2021.   Specialty: Orthopedic Surgery Why: at 8:45am Contact information: [place] [address] [phone]         Dereck Leep, MD Follow up on 09/24/2021.   Specialty: Orthopedic Surgery Why: at 2:45pm Contact information: [address] [facility] [place] [address] [phone]                 Signed: Prescott Parma, TODD 08/15/2021, 6:44 AM   Objective: Vital signs in last 24 hours: Temp:  [96.9 F (36.1 C)-98 F (36.7 C)] 97.8 F (36.6 C) (02/18 0547) Pulse Rate:  [49-85] 66 (02/18 0547) Resp:  [14-20] 20 (02/18 0547) BP: (92-138)/(48-78) 138/78 (02/18 0547) SpO2:  [92 %-100 %] 99 % (02/18 0547) Weight:  [72.6 kg] 72.6 kg (02/17 1019)  Intake/Output from previous day:  Intake/Output Summary (Last 24 hours) at 08/15/2021 0644 Last data filed at 08/15/2021 3244 Gross per 24 hour  Intake 1450 ml  Output 1110 ml  Net 340 ml    Intake/Output this shift: Total I/O In: -  Out: 110 [Drains:110]  Labs: Recent Labs    08/14/21 1106   HGB 13.9   Recent Labs    08/14/21 1106  HCT 41.0   Recent Labs    08/14/21 1106  NA 138  K 3.9  CL 104  BUN 18  CREATININE 0.90  GLUCOSE 113*   No results for input(s): LABPT, INR in the last 72 hours.  EXAM: General - Patient is Alert and Oriented Extremity - Neurovascular intact Sensation intact distally Dorsiflexion/Plantar flexion intact Compartment soft Incision - clean, dry, with a Hemovac tubing removed.  The tubing was intact on removal. Motor Function -plantarflexion and dorsiflexion are are intact.  Able to do a straight leg raise independently.  Assessment/Plan: 1 Day Post-Op Procedure(s) (LRB): COMPUTER ASSISTED TOTAL  KNEE ARTHROPLASTY - RNFA (Right) Procedure(s) (LRB): COMPUTER ASSISTED TOTAL KNEE ARTHROPLASTY - RNFA (Right) Past Medical History:  Diagnosis Date   History of stress test    a. 02/2013 Nl stress test.   Hypertension    Insomnia, unspecified    Mitral regurgitation    a. echo 01/2016: nl LV sys fxn, mild MR, w/o pulm htn, nl atrial size   Osteoporosis, unspecified    Paroxysmal atrial fibrillation ([place]) 01/2016   a. diagnosed 01/2016; b. has been on eliquis, pradaxa, and xarelto at varying times 2/2 cost of each medication-->currently on eliquis (11/2016)   Pure hypercholesterolemia    Sleep apnea    Unable to tolerate nocturnal PAP therapy   TIA (transient ischemic attack)    Unspecified asthma(493.90)    Vertigo    Principal Problem:   Total knee replacement status  Estimated body mass index is 28.34 kg/m as calculated from the following:   Height as of this encounter: 5\' 3"  (1.6 m).   Weight as of this encounter: 72.6 kg. Advance diet Up with therapy D/C IV fluids Discharge home with home health Diet - Regular diet Follow up - in 2 weeks Activity - WBAT Disposition - Home Condition Upon Discharge - Stable DVT Prophylaxis -  Eliquis  Reche Dixon, PA-C Orthopaedic Surgery 08/15/2021, 6:44 AM

## 2021-08-15 NOTE — Evaluation (Signed)
Physical Therapy Evaluation Patient Details Name: [name] MRN: [id number] DOB: 06-11-1941 Today's Date: 08/15/2021  History of Present Illness  Patient is a 81 year old female who underwent R total knee arthroplasty on 08/14/21. She additionally has history of R knee arthroscopy, partial medial meniscectomy, and chondroplasty on 10/08/20. She had severe pain and swelling 2 weeks following procedure. Debridement and arthroscopic irrigation on 10/21/20. Additional pain in left leg noted. Lumbar MRI on 05/16/21 showed mulitlevel facet arthropathy severe on L4-5 left and bilaterally L5-S1. PMH includes a fib, HLD, HTN, stroke, and TIA.   Clinical Impression  Patient is a very pleasant 81 year old female who presents s/p R TKA on 08/14/21. Patient is very nauseous at start of PT session and progressively worsens limiting full evaluation. Prior to hospital admission, pt was independent with [facility] and lives alone in a one story home with a ramp to enter.  Patient is in bed upon PT arrival and agreeable with evaluation. She is nauseous but able to participate with bed interventions and then requires min A to transition to sitting EOB. She tolerates sitting without assistance but upon STS transition she requires mod A with RW. Patient ambulates short duration prior to nausea and dizziness worsening requiring sudden stop and assistance to remain upright as patient began vomiting. She heavily leans upon Ue's to remain upright with additional assistance from PT to remain in standing position, if not held by PT patient would have fallen. Patient transitioned to chair with needs met, icing re-applied, and nursing notified and in room. Pt would benefit from skilled PT to address noted impairments and functional limitations (see below for any additional details).  Upon hospital discharge, if patient is not able to demonstrate improved stability and independence by afternoon session patient will be recommended to SNF as  at this time she is a fall risk and will require 24/7 care. If patient is able to progress this afternoon will potentially downgrade requirement as mobility and safety permits.         Recommendations for follow up therapy are one component of a multi-disciplinary discharge planning process, led by the attending physician.  Recommendations may be updated based on patient status, additional functional criteria and insurance authorization.  Follow Up Recommendations Skilled nursing-short term rehab (<3 hours/day) (Patient evaluation limited by severe nausea. At this time she requires 24/7 care so will recommend SNF with potential to downgrade to [facility] with aide and HHPT if performs better in next PT session.)    Assistance Recommended at Discharge Frequent or constant Supervision/Assistance  Patient can return home with the following  A lot of help with walking and/or transfers;A lot of help with bathing/dressing/bathroom;Assistance with cooking/housework;Help with stairs or ramp for entrance;Assist for transportation    Equipment Recommendations Rolling walker (2 wheels);BSC/3in1  Recommendations for Other Services       Functional Status Assessment Patient has had a recent decline in their functional status and demonstrates the ability to make significant improvements in function in a reasonable and predictable amount of time.     Precautions / Restrictions Precautions Precautions: Knee Restrictions Weight Bearing Restrictions: Yes RLE Weight Bearing: Weight bearing as tolerated      Mobility  Bed Mobility Overal bed mobility: Needs Assistance Bed Mobility: Supine to Sit     Supine to sit: Min assist     General bed mobility comments: Patient requires assistance for sitting EOB    Transfers Overall transfer level: Needs assistance Equipment used: Rolling walker (2  wheels) Transfers: Sit to/from Stand Sit to Stand: Mod assist, Min assist           General transfer  comment: Patient required mod A first trial and min A second sit to stand trial.    Ambulation/Gait Ambulation/Gait assistance: Min guard, Min assist Gait Distance (Feet): 45 Feet Assistive device: Rolling walker (2 wheels) Gait Pattern/deviations: Step-to pattern, Decreased stance time - right, Trunk flexed, Narrow base of support Gait velocity: decreased     General Gait Details: Patient ambulation limited by patient throwing up  Stairs            Wheelchair Mobility    Modified Rankin (Stroke Patients Only)       Balance Overall balance assessment: Needs assistance Sitting-balance support: Feet supported Sitting balance-Leahy Scale: Fair Sitting balance - Comments: able to static sit without assistance   Standing balance support: Bilateral upper extremity supported, Reliant on assistive device for balance, During functional activity Standing balance-Leahy Scale: Poor Standing balance comment: requires heavy BUE support in standing                             Pertinent Vitals/Pain Pain Assessment Pain Assessment: 0-10 Pain Score: 3  Pain Location: R knee Pain Descriptors / Indicators: Aching Pain Intervention(s): Monitored during session, Ice applied    Home Living Family/patient expects to be discharged to:: Private residence Living Arrangements: Alone Available Help at Discharge: Family;Available PRN/intermittently Type of Home: House Home Access: Ramped entrance       Home Layout: One level Home Equipment: Kasandra Knudsen - single point Additional Comments: Patient lives alone. Has a small ramp to enter her house that her neighbor built.    Prior Function Prior Level of Function : Independent/Modified Independent             Mobility Comments: Patient utilizes SPC at baseline. Is independent at baseline.       Hand Dominance   Dominant Hand: Right    Extremity/Trunk Assessment   Upper Extremity Assessment Upper Extremity Assessment:  Defer to OT evaluation    Lower Extremity Assessment Lower Extremity Assessment: RLE deficits/detail;LLE deficits/detail RLE Deficits / Details: unable to test fully due to surgical limitations RLE Sensation: decreased light touch LLE Deficits / Details: grossly 4/5 LLE Sensation: WNL LLE Coordination: WNL       Communication   Communication: No difficulties  Cognition Arousal/Alertness: Awake/alert Behavior During Therapy: WFL for tasks assessed/performed Overall Cognitive Status: Within Functional Limits for tasks assessed                                 General Comments: Patient A and O x 4, limited session due to vomiting.        General Comments General comments (skin integrity, edema, etc.): Patient has post surgical dressing on R knee, appears well nourished and groomed.    Exercises Total Joint Exercises Ankle Circles/Pumps: Strengthening, Both, 10 reps, Supine Quad Sets: Strengthening, Right, 10 reps, Supine Heel Slides: Strengthening, AROM, Right, 5 reps, Supine Hip ABduction/ADduction: AROM, Strengthening, Right, 5 reps, Supine Straight Leg Raises: Strengthening, Right, 5 reps, Supine Goniometric ROM: flexion: 71 extension -5 Other Exercises Other Exercises: Patient educated on the role of PT in acute care setting, use of ice for pain reduction, weight shift, and LE protocol   Assessment/Plan    PT Assessment Patient needs continued PT services  PT Problem  List Decreased strength;Decreased range of motion;Decreased activity tolerance;Decreased balance;Decreased knowledge of use of DME;Decreased coordination;Decreased mobility;Pain       PT Treatment Interventions DME instruction;Gait training;Stair training;Functional mobility training;Therapeutic activities;Patient/family education;Neuromuscular re-education;Balance training;Therapeutic exercise;Manual techniques    PT Goals (Current goals can be found in the Care Plan section)  Acute Rehab PT  Goals Patient Stated Goal: to walk again PT Goal Formulation: With patient Time For Goal Achievement: 08/29/21 Potential to Achieve Goals: Fair    Frequency BID     Co-evaluation               AM-PAC PT "6 Clicks" Mobility  Outcome Measure Help needed turning from your back to your side while in a flat bed without using bedrails?: A Little Help needed moving from lying on your back to sitting on the side of a flat bed without using bedrails?: A Little Help needed moving to and from a bed to a chair (including a wheelchair)?: A Little Help needed standing up from a chair using your arms (e.g., wheelchair or bedside chair)?: A Little Help needed to walk in hospital room?: A Little Help needed climbing 3-5 steps with a railing? : A Lot 6 Click Score: 17    End of Session Equipment Utilized During Treatment: Gait belt Activity Tolerance: Other (comment) (Patient limited by nausea and vomiting) Patient left: in chair;with call bell/phone within reach;with chair alarm set;with nursing/sitter in room Nurse Communication: Mobility status;Weight bearing status PT Visit Diagnosis: Unsteadiness on feet (R26.81);Other abnormalities of gait and mobility (R26.89);Muscle weakness (generalized) (M62.81);Difficulty in walking, not elsewhere classified (R26.2);Pain Pain - Right/Left: Right Pain - part of body: Knee    Time: 0822-0856 PT Time Calculation (min) (ACUTE ONLY): 34 min   Charges:   PT Evaluation $PT Eval Low Complexity: 1 Low PT Treatments $Therapeutic Exercise: 8-22 mins $Therapeutic Activity: 8-22 mins        Janna Arch, PT, DPT  08/15/2021, 9:54 AM

## 2021-08-15 NOTE — Progress Notes (Signed)
[facility] accepted patient for PT services. [place] from [place] aware of patient's discharge home today.   [address], [place] Transition of Care [phone]

## 2021-08-15 NOTE — Discharge Summary (Addendum)
Physician Discharge Summary  Subjective: 1 Day Post-Op Procedure(s) (LRB): COMPUTER ASSISTED TOTAL KNEE ARTHROPLASTY - RNFA (Right) Patient reports pain as mild.   Patient seen in rounds with Dr. Rudene Christians. Patient is well, but has had some minor complaints of nausea Patient is ready to go home after physical therapy.  Physician Discharge Summary  [name] MRN: [id number] DOB/AGE: 09-14-1940 81 y.o.  Admit date: 08/14/2021 Discharge date: 08/15/2021  Admission Diagnoses:  Discharge Diagnoses:  Principal Problem:   Total knee replacement status   Discharged Condition: fair  Hospital Course: The patient is postop day 1 from a right total knee arthroplasty.  She is doing well since surgery.  She has had some mild nausea.  Her pain is well controlled.  She had her bandage and drain removed this morning.  She will be doing physical therapy before going home.  Treatments: surgery:  Right total knee arthroplasty using computer-assisted navigation   SURGEON:  Marciano Sequin. M.D.   ANESTHESIA: spinal   ESTIMATED BLOOD LOSS: 50 mL   FLUIDS REPLACED: 1300 mL of crystalloid   TOURNIQUET TIME: 90 minutes   DRAINS: 2 medium Hemovac drains   SOFT TISSUE RELEASES: Anterior cruciate ligament, posterior cruciate ligament, deep medial collateral ligament, patellofemoral ligament   IMPLANTS UTILIZED: DePuy Attune size 4N posterior stabilized femoral component (cemented), size 3 rotating platform tibial component (cemented), 32 mm medialized dome patella (cemented), and a 5 mm stabilized rotating platform polyethylene insert.    Discharge Exam: Blood pressure 138/78, pulse 66, temperature 97.8 F (36.6 C), resp. rate 20, height 5\' 3"  (1.6 m), weight 72.6 kg, SpO2 99 %.   Disposition: Discharge disposition: 01-Home or Self Care        Allergies as of 08/15/2021       Reactions   Ace Inhibitors Swelling   Patient states her tongue swell.   Penicillins  Anxiety   IgE = 251 (WNL) on 07/31/2021 Has patient had a PCN reaction causing immediate rash, facial/tongue/throat swelling, SOB or lightheadedness with hypotension: No Has patient had a PCN reaction causing severe rash involving mucus membranes or skin necrosis: No Has patient had a PCN reaction that required hospitalization No Has patient had a PCN reaction occurring within the last 10 years: No If all of the above answers are "NO", then may proceed with Cephalosporin use.        Medication List     TAKE these medications    acetaminophen 650 MG CR tablet Commonly known as: TYLENOL Take 650 mg by mouth every 8 (eight) hours as needed for pain.   albuterol 108 (90 Base) MCG/ACT inhaler Commonly known as: VENTOLIN HFA INHALE 2 PUFFS INTO THE LUNGS EVERY 6 HOURS AS NEEDED FOR WHEEZING OR SHORTNESS OF BREATH   amLODipine 2.5 MG tablet Commonly known as: NORVASC Take 3 tablets (7.5 mg total) by mouth daily. Take 2 in am and one in pm if bp is below 115/75 What changed:  how much to take when to take this   CALCIUM 1200+D3 PO Take 1 tablet by mouth 2 (two) times a week. 1200 mg calcium / 1000 units Vitamin D3   celecoxib 200 MG capsule Commonly known as: CELEBREX Take 1 capsule (200 mg total) by mouth 2 (two) times daily.   diltiazem 30 MG tablet Commonly known as: Cardizem Take 1 tablet (30 mg total) by mouth every 6 (six) hours as needed (for tachycardia/recurrent Afib.).   Eliquis 5 MG Tabs tablet Generic drug:  apixaban TAKE 1 TABLET(5 MG) BY MOUTH TWICE DAILY   meclizine 25 MG tablet Commonly known as: ANTIVERT Take 25 mg by mouth 3 (three) times daily as needed (vertigo/dizziness).   oxyCODONE 5 MG immediate release tablet Commonly known as: Oxy IR/ROXICODONE Take 1 tablet (5 mg total) by mouth every 4 (four) hours as needed for moderate pain (pain score 4-6).   potassium chloride 10 MEQ tablet Commonly known as: KLOR-CON Take 1 tablet (10 mEq total) by mouth  daily for 14 days.   rosuvastatin 10 MG tablet Commonly known as: CRESTOR TAKE 1 TABLET(10 MG) BY MOUTH DAILY What changed: See the new instructions.   traMADol 50 MG tablet Commonly known as: ULTRAM Take 1-2 tablets (50-100 mg total) by mouth every 4 (four) hours as needed for moderate pain.               Durable Medical Equipment  (From admission, onward)           Start     Ordered   08/14/21 1843  DME Walker rolling  Once       Question:  Patient needs a walker to treat with the following condition  Answer:  Total knee replacement status   08/14/21 1842   08/14/21 1843  DME Bedside commode  Once       Question:  Patient needs a bedside commode to treat with the following condition  Answer:  Total knee replacement status   08/14/21 1842            Follow-up Information     Fausto Skillern, PA-C Follow up on 08/28/2021.   Specialty: Orthopedic Surgery Why: at 8:45am Contact information: [place] [address] [phone]         Dereck Leep, MD Follow up on 09/24/2021.   Specialty: Orthopedic Surgery Why: at 2:45pm Contact information: [address] [facility] [place] [address] [phone]                 Signed: Prescott Parma, TODD 08/15/2021, 6:44 AM   Objective: Vital signs in last 24 hours: Temp:  [96.9 F (36.1 C)-98 F (36.7 C)] 97.8 F (36.6 C) (02/18 0547) Pulse Rate:  [49-85] 66 (02/18 0547) Resp:  [14-20] 20 (02/18 0547) BP: (92-138)/(48-78) 138/78 (02/18 0547) SpO2:  [92 %-100 %] 99 % (02/18 0547) Weight:  [72.6 kg] 72.6 kg (02/17 1019)  Intake/Output from previous day:  Intake/Output Summary (Last 24 hours) at 08/15/2021 0644 Last data filed at 08/15/2021 8003 Gross per 24 hour  Intake 1450 ml  Output 1110 ml  Net 340 ml    Intake/Output this shift: Total I/O In: -  Out: 110 [Drains:110]  Labs: Recent Labs     08/14/21 1106  HGB 13.9   Recent Labs    08/14/21 1106  HCT 41.0   Recent Labs    08/14/21 1106  NA 138  K 3.9  CL 104  BUN 18  CREATININE 0.90  GLUCOSE 113*   No results for input(s): LABPT, INR in the last 72 hours.  EXAM: General - Patient is Alert and Oriented Extremity - Neurovascular intact Sensation intact distally Dorsiflexion/Plantar flexion intact Compartment soft Incision - clean, dry, with a Hemovac tubing removed.  The tubing was intact on removal. Motor Function -plantarflexion and dorsiflexion are are intact.  Able to do a straight leg raise independently.  Assessment/Plan: 1 Day Post-Op Procedure(s) (LRB): COMPUTER ASSISTED TOTAL  KNEE ARTHROPLASTY - RNFA (Right) Procedure(s) (LRB): COMPUTER ASSISTED TOTAL KNEE ARTHROPLASTY - RNFA (Right) Past Medical History:  Diagnosis Date   History of stress test    a. 02/2013 Nl stress test.   Hypertension    Insomnia, unspecified    Mitral regurgitation    a. echo 01/2016: nl LV sys fxn, mild MR, w/o pulm htn, nl atrial size   Osteoporosis, unspecified    Paroxysmal atrial fibrillation ([place]) 01/2016   a. diagnosed 01/2016; b. has been on eliquis, pradaxa, and xarelto at varying times 2/2 cost of each medication-->currently on eliquis (11/2016)   Pure hypercholesterolemia    Sleep apnea    Unable to tolerate nocturnal PAP therapy   TIA (transient ischemic attack)    Unspecified asthma(493.90)    Vertigo    Principal Problem:   Total knee replacement status  Estimated body mass index is 28.34 kg/m as calculated from the following:   Height as of this encounter: 5\' 3"  (1.6 m).   Weight as of this encounter: 72.6 kg. Advance diet Up with therapy D/C IV fluids Discharge home with home health Diet - Regular diet Follow up - in 2 weeks Activity - WBAT Disposition - Home Condition Upon Discharge - Stable DVT Prophylaxis -  Eliquis  Reche Dixon, PA-C Orthopaedic Surgery 08/15/2021, 6:44 AM

## 2021-08-15 NOTE — Progress Notes (Signed)
°  Subjective: 1 Day Post-Op Procedure(s) (LRB): COMPUTER ASSISTED TOTAL KNEE ARTHROPLASTY - RNFA (Right) Patient reports pain as mild.   Patient is well, and has had no acute complaints or problems.  The patient is complaining of some mild nausea this morning. Plan is to go Home after hospital stay. Negative for chest pain and shortness of breath Fever: no Gastrointestinal: Positive for nausea without vomiting  Objective: Vital signs in last 24 hours: Temp:  [96.9 F (36.1 C)-98 F (36.7 C)] 97.8 F (36.6 C) (02/18 0547) Pulse Rate:  [49-85] 66 (02/18 0547) Resp:  [14-20] 20 (02/18 0547) BP: (92-138)/(48-78) 138/78 (02/18 0547) SpO2:  [92 %-100 %] 99 % (02/18 0547) Weight:  [72.6 kg] 72.6 kg (02/17 1019)  Intake/Output from previous day:  Intake/Output Summary (Last 24 hours) at 08/15/2021 0640 Last data filed at 08/15/2021 9811 Gross per 24 hour  Intake 1450 ml  Output 1110 ml  Net 340 ml    Intake/Output this shift: Total I/O In: -  Out: 110 [Drains:110]  Labs: Recent Labs    08/14/21 1106  HGB 13.9   Recent Labs    08/14/21 1106  HCT 41.0   Recent Labs    08/14/21 1106  NA 138  K 3.9  CL 104  BUN 18  CREATININE 0.90  GLUCOSE 113*   No results for input(s): LABPT, INR in the last 72 hours.   EXAM General - Patient is Alert and Oriented Extremity - Neurovascular intact Sensation intact distally Dorsiflexion/Plantar flexion intact Compartment soft Dressing/Incision - clean, dry, with a Hemovac drain removed with no complication.  On removal the tubing was intact. Motor Function - intact, moving foot and toes well on exam.   Past Medical History:  Diagnosis Date   History of stress test    a. 02/2013 Nl stress test.   Hypertension    Insomnia, unspecified    Mitral regurgitation    a. echo 01/2016: nl LV sys fxn, mild MR, w/o pulm htn, nl atrial size   Osteoporosis, unspecified    Paroxysmal atrial fibrillation ([place]) 01/2016   a. diagnosed  01/2016; b. has been on eliquis, pradaxa, and xarelto at varying times 2/2 cost of each medication-->currently on eliquis (11/2016)   Pure hypercholesterolemia    Sleep apnea    Unable to tolerate nocturnal PAP therapy   TIA (transient ischemic attack)    Unspecified asthma(493.90)    Vertigo     Assessment/Plan: 1 Day Post-Op Procedure(s) (LRB): COMPUTER ASSISTED TOTAL KNEE ARTHROPLASTY - RNFA (Right) Principal Problem:   Total knee replacement status  Estimated body mass index is 28.34 kg/m as calculated from the following:   Height as of this encounter: 5\' 3"  (1.6 m).   Weight as of this encounter: 72.6 kg. Advance diet Up with therapy D/C IV fluids Discharge home with home health  DVT Prophylaxis - Foot Pumps, TED hose, and Eliquis Weight-Bearing as tolerated to right leg  Reche Dixon, PA-C Orthopaedic Surgery 08/15/2021, 6:40 AM

## 2021-08-17 ENCOUNTER — Encounter: Payer: Self-pay | Admitting: Orthopedic Surgery

## 2021-08-19 ENCOUNTER — Ambulatory Visit: Payer: Medicare Other

## 2021-08-19 DIAGNOSIS — I10 Essential (primary) hypertension: Secondary | ICD-10-CM

## 2021-08-19 DIAGNOSIS — M1711 Unilateral primary osteoarthritis, right knee: Secondary | ICD-10-CM

## 2021-08-19 DIAGNOSIS — Z9181 History of falling: Secondary | ICD-10-CM

## 2021-08-19 NOTE — Patient Instructions (Addendum)
Thank you for allowing the Chronic Care Management team to participate in your care. It was great speaking with you today. I hope you continue to have a smooth recovery.  Our next appointment is 09/09/2021 at 0900. Please call the care guide team at [phone] if you need to cancel or reschedule your appointment.    Please don't hesitate to contact me if you require assistance before our next telephone outreach.   [name] Care Management [facility] [phone]

## 2021-08-19 NOTE — Chronic Care Management (AMB) (Signed)
Chronic Care Management   CCM RN Visit Note  08/19/2021 Name: [name] MRN: [id number] DOB: 11/02/1940  Subjective: [name] is a 81 y.o. year old female who is a primary care patient of Steele Sizer, MD. The care management team was consulted for assistance with disease management and care coordination needs.    Engaged with patient by telephone for follow up visit in response to provider referral for case management and care coordination services.   Consent to Services:  The patient was given information about Chronic Care Management services, agreed to services, and gave verbal consent prior to initiation of services.  Please see initial visit note for detailed documentation.    Assessment: Review of patient past medical history, allergies, medications, health status, including review of consultants reports, laboratory and other test data, was performed as part of comprehensive evaluation and provision of chronic care management services.   SDOH (Social Determinants of Health) assessments and interventions performed: No  CCM Care Plan  Allergies  Allergen Reactions   Ace Inhibitors Swelling    Patient states her tongue swell.   Penicillins Anxiety    IgE = 251 (WNL) on 07/31/2021  Has patient had a PCN reaction causing immediate rash, facial/tongue/throat swelling, SOB or lightheadedness with hypotension: No Has patient had a PCN reaction causing severe rash involving mucus membranes or skin necrosis: No Has patient had a PCN reaction that required hospitalization No Has patient had a PCN reaction occurring within the last 10 years: No If all of the above answers are "NO", then may proceed with Cephalosporin use.    Outpatient Encounter Medications as of 08/19/2021  Medication Sig Note   celecoxib (CELEBREX) 200 MG capsule Take 1 capsule (200 mg total) by mouth 2 (two) times daily.    acetaminophen (TYLENOL) 650 MG CR tablet Take 650 mg by mouth every 8  (eight) hours as needed for pain.    albuterol (VENTOLIN HFA) 108 (90 Base) MCG/ACT inhaler INHALE 2 PUFFS INTO THE LUNGS EVERY 6 HOURS AS NEEDED FOR WHEEZING OR SHORTNESS OF BREATH    amLODipine (NORVASC) 2.5 MG tablet Take 3 tablets (7.5 mg total) by mouth daily. Take 2 in am and one in pm if bp is below 115/75 (Patient taking differently: Take 5 mg by mouth 2 (two) times daily. Take 2 in am and one in pm if bp is below 115/75)    apixaban (ELIQUIS) 5 MG TABS tablet TAKE 1 TABLET(5 MG) BY MOUTH TWICE DAILY    Calcium-Magnesium-Vitamin D (CALCIUM 1200+D3 PO) Take 1 tablet by mouth 2 (two) times a week. 1200 mg calcium / 1000 units Vitamin D3    diltiazem (CARDIZEM) 30 MG tablet Take 1 tablet (30 mg total) by mouth every 6 (six) hours as needed (for tachycardia/recurrent Afib.). 04/29/2021: Has not needed in >6 months   meclizine (ANTIVERT) 25 MG tablet Take 25 mg by mouth 3 (three) times daily as needed (vertigo/dizziness).    oxyCODONE (OXY IR/ROXICODONE) 5 MG immediate release tablet Take 1 tablet (5 mg total) by mouth every 4 (four) hours as needed for moderate pain (pain score 4-6).    potassium chloride (KLOR-CON) 10 MEQ tablet Take 1 tablet (10 mEq total) by mouth daily for 14 days.    rosuvastatin (CRESTOR) 10 MG tablet TAKE 1 TABLET(10 MG) BY MOUTH DAILY (Patient taking differently: Take 10 mg by mouth at bedtime.)    traMADol (ULTRAM) 50 MG tablet Take 1-2 tablets (50-100 mg total) by mouth every 4 (  four) hours as needed for moderate pain.    No facility-administered encounter medications on file as of 08/19/2021.    Patient Active Problem List   Diagnosis Date Noted   Total knee replacement status 08/14/2021   Pain of left lower extremity 05/10/2021   Osteoarthritis of right knee 05/05/2020   History of colonic polyps    Polyp of transverse colon    Aneurysm ([place]) 02/08/2018   History of CVA (cerebrovascular accident) without residual deficits 01/24/2018   Aneurysm of ophthalmic  artery 01/24/2018   TIA (transient ischemic attack) 01/22/2018   Impingement syndrome of shoulder region 01/02/2018   History of cataract surgery, right 09/01/2016   Paroxysmal atrial fibrillation ([place]) 02/24/2016   Vertigo 02/24/2016   Low HDL (under 40) 02/17/2016   Dizziness 02/15/2016   Hx of colonic polyps    Benign neoplasm of sigmoid colon    Asthma, mild intermittent 02/21/2015   Hyperglycemia 02/21/2015   Essential hypertension 02/20/2015   Inconclusive mammogram due to dense breasts 01/02/2015   Dense breast 01/02/2015   Abnormal EKG 02/23/2013    Patient Care Plan: RN Care Management Plan of Care     Problem Identified: A-Fib, HTN, Diverticulitis, Chronic Joint Pain      Long-Range Goal: Disease Progression Prevented or Minimized   Start Date: 08/07/2021  Expected End Date: 11/05/2021  Priority: High  Note:   Current Barriers:  Chronic Disease Management support and education needs related to Atrial Fibrillation, HTN, HLD, and Chronic Joint Pain.  RNCM Clinical Goal(s):  Patient will continue to work with RN Care Manager and the care management team to address care management and care coordination needs related to  Atrial Fibrillation, HTN, HLD, and Chronic Joint Pain.  Interventions: 1:1 collaboration with primary care provider regarding development and update of comprehensive plan of care as evidenced by provider attestation and co-signature Inter-disciplinary care team collaboration (see longitudinal plan of care) Evaluation of current treatment plan related to  self management and patient's adherence to plan as established by provider   Fall Risk: S/P Right Knee replacement. Reports doing well since returning home. Reports using her walker and following recommended activity restrictions as advised by the Ortho team. Reviewed safety and fall prevention measures. Discussed plan for therapy. Currently receiving home health physical therapy. Plan is to complete  sessions three times a week.  Confirmed that her daughter will be available during her recovery. She will be available to provide transportation and assist with any in-home needs. Agreed to update the care management team if additional assistance is required.  Pain Interventions:  Reviewed plan for pain management. S/P total right knee replacement on 08/14/21. Requested assistance with coordinating a schedule for taking pain medications. Thorough discussion regarding anticipated physical therapy schedule, current medication regimen and recommendations for breakthrough pain.  Confirmed that pain has been well controlled since surgery. Discussed worsening symptoms and indications for notifying the Ortho team.  Hypertension Interventions:  Reviewed plan for hypertension management. Reports BP readings have been within range. Reports the home health team is also monitoring closely to ensure readings are not significantly elevated prior to PT sessions. Advised to continue monitoring and recording readings.  Reviewed s/sx of heart attack, stroke and worsening symptoms that require immediate medical attention.   Patient Goals/Self-Care Activities: Patient will self administer medications as prescribed Patient will attend all scheduled provider appointments Patient will call pharmacy for medication refills Patient will call provider office for new concerns or questions  PLAN Will follow up next  month       PLAN A member of the care management team will follow up next month.   [name] Care Management [facility] [phone]

## 2021-08-25 DIAGNOSIS — M1711 Unilateral primary osteoarthritis, right knee: Secondary | ICD-10-CM

## 2021-08-25 DIAGNOSIS — I10 Essential (primary) hypertension: Secondary | ICD-10-CM

## 2021-09-09 ENCOUNTER — Ambulatory Visit (INDEPENDENT_AMBULATORY_CARE_PROVIDER_SITE_OTHER): Payer: Medicare Other

## 2021-09-09 DIAGNOSIS — M1711 Unilateral primary osteoarthritis, right knee: Secondary | ICD-10-CM

## 2021-09-09 DIAGNOSIS — I10 Essential (primary) hypertension: Secondary | ICD-10-CM

## 2021-09-09 DIAGNOSIS — Z9181 History of falling: Secondary | ICD-10-CM

## 2021-09-09 NOTE — Chronic Care Management (AMB) (Signed)
?Chronic Care Management  ? ?CCM RN Visit Note ? ?09/09/2021 ?Name: [name] MRN: [id number] DOB: 06/02/1941 ? ?Subjective: ?[name] is a 81 y.o. year old female who is a primary care patient of Steele Sizer, MD. The care management team was consulted for assistance with disease management and care coordination needs.   ? ?Engaged with patient by telephone for follow up visit in response to provider referral for case management and care coordination services.  ? ?Consent to Services:  ?The patient was given information about Chronic Care Management services, agreed to services, and gave verbal consent prior to initiation of services.  Please see initial visit note for detailed documentation.  ? ?Assessment: Review of patient past medical history, allergies, medications, health status, including review of consultants reports, laboratory and other test data, was performed as part of comprehensive evaluation and provision of chronic care management services.  ? ?SDOH (Social Determinants of Health) assessments and interventions performed: No ? ?CCM Care Plan ? ?Allergies  ?Allergen Reactions  ? Ace Inhibitors Swelling  ?  Patient states her tongue swell.  ? Penicillins Anxiety  ?  IgE = 251 (WNL) on 07/31/2021 ? ?Has patient had a PCN reaction causing immediate rash, facial/tongue/throat swelling, SOB or lightheadedness with hypotension: No ?Has patient had a PCN reaction causing severe rash involving mucus membranes or skin necrosis: No ?Has patient had a PCN reaction that required hospitalization No ?Has patient had a PCN reaction occurring within the last 10 years: No ?If all of the above answers are "NO", then may proceed with Cephalosporin use.  ? ? ?Outpatient Encounter Medications as of 09/09/2021  ?Medication Sig Note  ? acetaminophen (TYLENOL) 650 MG CR tablet Take 650 mg by mouth every 8 (eight) hours as needed for pain.   ? albuterol (VENTOLIN HFA) 108 (90 Base) MCG/ACT inhaler INHALE 2  PUFFS INTO THE LUNGS EVERY 6 HOURS AS NEEDED FOR WHEEZING OR SHORTNESS OF BREATH   ? amLODipine (NORVASC) 2.5 MG tablet Take 3 tablets (7.5 mg total) by mouth daily. Take 2 in am and one in pm if bp is below 115/75 (Patient taking differently: Take 5 mg by mouth 2 (two) times daily. Take 2 in am and one in pm if bp is below 115/75)   ? apixaban (ELIQUIS) 5 MG TABS tablet TAKE 1 TABLET(5 MG) BY MOUTH TWICE DAILY   ? Calcium-Magnesium-Vitamin D (CALCIUM 1200+D3 PO) Take 1 tablet by mouth 2 (two) times a week. 1200 mg calcium / 1000 units Vitamin D3   ? celecoxib (CELEBREX) 200 MG capsule Take 1 capsule (200 mg total) by mouth 2 (two) times daily.   ? diltiazem (CARDIZEM) 30 MG tablet Take 1 tablet (30 mg total) by mouth every 6 (six) hours as needed (for tachycardia/recurrent Afib.). 04/29/2021: Has not needed in >6 months  ? meclizine (ANTIVERT) 25 MG tablet Take 25 mg by mouth 3 (three) times daily as needed (vertigo/dizziness).   ? oxyCODONE (OXY IR/ROXICODONE) 5 MG immediate release tablet Take 1 tablet (5 mg total) by mouth every 4 (four) hours as needed for moderate pain (pain score 4-6).   ? potassium chloride (KLOR-CON) 10 MEQ tablet Take 1 tablet (10 mEq total) by mouth daily for 14 days.   ? rosuvastatin (CRESTOR) 10 MG tablet TAKE 1 TABLET(10 MG) BY MOUTH DAILY (Patient taking differently: Take 10 mg by mouth at bedtime.)   ? traMADol (ULTRAM) 50 MG tablet Take 1-2 tablets (50-100 mg total) by mouth every 4 (four)  hours as needed for moderate pain.   ? ?No facility-administered encounter medications on file as of 09/09/2021.  ? ? ?Patient Active Problem List  ? Diagnosis Date Noted  ? Total knee replacement status 08/14/2021  ? Pain of left lower extremity 05/10/2021  ? Osteoarthritis of right knee 05/05/2020  ? History of colonic polyps   ? Polyp of transverse colon   ? Aneurysm ([place]) 02/08/2018  ? History of CVA (cerebrovascular accident) without residual deficits 01/24/2018  ? Aneurysm of ophthalmic artery  01/24/2018  ? TIA (transient ischemic attack) 01/22/2018  ? Impingement syndrome of shoulder region 01/02/2018  ? History of cataract surgery, right 09/01/2016  ? Paroxysmal atrial fibrillation ([place]) 02/24/2016  ? Vertigo 02/24/2016  ? Low HDL (under 40) 02/17/2016  ? Dizziness 02/15/2016  ? Hx of colonic polyps   ? Benign neoplasm of sigmoid colon   ? Asthma, mild intermittent 02/21/2015  ? Hyperglycemia 02/21/2015  ? Essential hypertension 02/20/2015  ? Inconclusive mammogram due to dense breasts 01/02/2015  ? Dense breast 01/02/2015  ? Abnormal EKG 02/23/2013  ? ? ?Patient Care Plan: RN Care Management Plan of Care  ?  ? ?Problem Identified: A-Fib, HTN, Diverticulitis, Chronic Joint Pain   ?  ? ?Long-Range Goal: Disease Progression Prevented or Minimized   ?Start Date: 08/07/2021  ?Expected End Date: 11/05/2021  ?Priority: High  ?Note:   ?Current Barriers:  ?Chronic Disease Management support and education needs related to Atrial Fibrillation, HTN, HLD, and Chronic Joint Pain. ? ?RNCM Clinical Goal(s):  ?Patient will continue to work with RN Care Manager and the care management team to address care management and care coordination needs related to  Atrial Fibrillation, HTN, HLD, and Chronic Joint Pain. ? ?Interventions: ?1:1 collaboration with primary care provider regarding development and update of comprehensive plan of care as evidenced by provider attestation and co-signature ?Inter-disciplinary care team collaboration (see longitudinal plan of care) ?Evaluation of current treatment plan related to  self management and patient's adherence to plan as established by provider ? ? ?Fall Risk: ?Reviewed safety and fall prevention measures. Reports recovering well. Reports knee remains sore but tolerating physical therapy sessions well. Reports following activity restrictions as recommended by the physical therapy team. Denies falls. ?Discussed ability to perform ADLs. Reports completing ADLs with minimal assistance.  Reports having very good family support. Declined need for additional in-home assistance. ? ?Pain Interventions:  ?Reviewed plan for pain management. ?Reviewed current medication regimen. Reviewed medications for routine v/s  breakthrough pain. Reports pain has been well controlled. Reports not requiring medications prior to or immediately after physical therapy sessions. ?Discussed importance of adhering to recommended activity restrictions until cleared by the Ortho team. ?Reviewed indications for notifying the Ortho team. ? ?Hypertension Interventions:  ?Reviewed hypertension plan. Reports excellent compliance with medications and monitoring BP as advised. Reports readings have been within range. Remains compliant with nutritional intake and attempting to adhere to a cardiac prudent/heart healthy diet. Reports monitoring sodium intake as advised.  ?Reviewed s/sx of heart attack, stroke and worsening symptoms that require immediate medical attention. ? ?Patient Goals/Self-Care Activities: ?Patient will self administer medications as prescribed ?Patient will attend all scheduled provider appointments ?Patient will call pharmacy for medication refills ?Patient will call provider office for new concerns or questions ? ?PLAN ?Will follow up in three months ? ?  ?  ?PLAN ?A member of the care management team will follow up in three months. ? ? ?Barbarann Kelly,RN ?[place] ?[facility] ?[phone] ? ? ? ? ? ? ? ? ?

## 2021-09-25 DIAGNOSIS — M1711 Unilateral primary osteoarthritis, right knee: Secondary | ICD-10-CM

## 2021-09-25 DIAGNOSIS — I10 Essential (primary) hypertension: Secondary | ICD-10-CM

## 2021-10-13 ENCOUNTER — Other Ambulatory Visit: Payer: Self-pay | Admitting: Family Medicine

## 2021-10-13 DIAGNOSIS — E785 Hyperlipidemia, unspecified: Secondary | ICD-10-CM

## 2021-10-13 DIAGNOSIS — Z8673 Personal history of transient ischemic attack (TIA), and cerebral infarction without residual deficits: Secondary | ICD-10-CM

## 2021-10-25 ENCOUNTER — Other Ambulatory Visit: Payer: Self-pay | Admitting: Cardiovascular Disease

## 2021-10-25 DIAGNOSIS — I480 Paroxysmal atrial fibrillation: Secondary | ICD-10-CM

## 2021-10-26 NOTE — Telephone Encounter (Signed)
Prescription refill request for Eliquis received. ?Indication: PAF ?Last office visit: 02/19/21  Rod Can MD ?Scr: 0.90 on 08/14/21 ?Age: 81 ?Weight: 74.6kg ? ?Based on above findings Eliquis '5mg'$  twice daily is the appropriate dose.  Refill approved. ? ?

## 2021-10-26 NOTE — Telephone Encounter (Signed)
Refill Request.  

## 2021-10-28 ENCOUNTER — Telehealth: Payer: Medicare Other

## 2021-11-18 NOTE — Progress Notes (Signed)
Name: [name]   MRN: [id number]    DOB: 06/06/1941   Date:11/19/2021       Progress Note  Subjective  Chief Complaint  Follow Up  HPI  HTN: bp is at goal on Norvasc 5 mg up to BID , no chest pain , seldom has palpitation but no SOB . BP better since taking norvasc to 5 mg in am and 2.5 mg in pm if bp above 115/75 at night  BP today is at goal   Pre-diabetes: last dropped from 6.1 % to 5.7 %  Denies polyphagia, polydipsia or polyuria . She tries to cut down on desserts   Paroxysmal Afib: sees Dr. Fletcher Anon, symptoms started August 2017, she is on Eliquis daily  and prn Cardizem. Discussed regular follow ups with cardiologist . She She states no bruises lately    History of CVA/TIA and also aneursym of left para ophthalmic artery , under the care of Dr. Manuella Ghazi - neurologist.  She is now taking Crestor and denies side effects.. She has occasional right frontal headache and soreness on right side of neck and per Dr. Trena Platt note trigeminal neuralgia but she is doing well lately    MR angio head without contrast from 01/23/2020 compared with previous one done  09/08/2018:   IMPRESSION: Stable appearance of 3 mm left paraophthalmic ICA aneurysm.   No new aneurysm or high-grade narrowing.   Asthma: she states symptoms are controlled a this time. Only taking albuterol prn and at less than once a month   OSA: seen by Dr. Manuella Ghazi - neurologist , unable to tolerate CPAP, sleep study done 06/30/2018 , she was supposed to have an oral appliance., unable to replaced it due to cost . Unchanged.    Vertigo: she has vertigo with head movement, motion sickness, and when she has an episode she needs to go to [facility] by EMS because of severe nausea, vomiting and diarrhea 06/2016. She had Summer of 2020 but resolved by itself . She states promethazine is more helpful than Meclizine prn. Episodes are sporadic, still happens when she rolls in bed at time, seen by neurologist and given diagnosis of BPPV  Insomnia:  she has some old trazodone but is not really taking medications for sleep, discussed melatonin   Malnutrition: : she started a diet   Fall 2021, and lost 13 lbs in 6 months, however she stopped diet and continue to lose weight, she had lost another  13 lbs  from 06/2020 to Feb 2022  We check multiple labs 05/2021 , negative CRP, normal comp panel, no anemia, white count back to normal. Had slight elevated of platelets and also sed rage that had improved down from 80's to 60 and while at [facility] 02/23 down to 33 . Her weight is finally trending up a little . Her baseline weight was upper 170's to 180 lbs, but finally stable now around 160 lbs She has been drinking chocolate Boost  Bilateral knee pain: she had right knee replacement Feb 2023, completed PT, not ready to have surgery on the left knee yet   Low back pain with radiculitis to left side: had to go to [facility] end of 2022, saw Dr. Sharlet Salina and had two epidural injections, pain on left resolved, noticed pain on posterior thigh last night but resolved with extra strength Tylenol   Atherosclerosis of aorta: found on CT abdomen done 10/22, continue eliquis and statin therapy   Patient Active Problem List   Diagnosis Date Noted  Total knee replacement status 08/14/2021   Pain of left lower extremity 05/10/2021   Osteoarthritis of right knee 05/05/2020   History of colonic polyps    Polyp of transverse colon    Aneurysm ([place]) 02/08/2018   History of CVA (cerebrovascular accident) without residual deficits 01/24/2018   Aneurysm of ophthalmic artery 01/24/2018   TIA (transient ischemic attack) 01/22/2018   Impingement syndrome of shoulder region 01/02/2018   History of cataract surgery, right 09/01/2016   Paroxysmal atrial fibrillation ([place]) 02/24/2016   Vertigo 02/24/2016   Low HDL (under 40) 02/17/2016   Dizziness 02/15/2016   Hx of colonic polyps    Benign neoplasm of sigmoid colon    Asthma, mild intermittent 02/21/2015   Hyperglycemia  02/21/2015   Essential hypertension 02/20/2015   Inconclusive mammogram due to dense breasts 01/02/2015   Dense breast 01/02/2015   Abnormal EKG 02/23/2013    Past Surgical History:  Procedure Laterality Date   ABDOMINAL HYSTERECTOMY     CATARACT EXTRACTION Bilateral 08/31/2016   COLONOSCOPY WITH PROPOFOL N/A 03/04/2015   Procedure: COLONOSCOPY WITH PROPOFOL;  Surgeon: Lucilla Lame, MD;  Location: [facility] ENDOSCOPY;  Service: Endoscopy;  Laterality: N/A;   COLONOSCOPY WITH PROPOFOL N/A 04/01/2020   Procedure: COLONOSCOPY WITH PROPOFOL;  Surgeon: Lucilla Lame, MD;  Location: [facility] ENDOSCOPY;  Service: Endoscopy;  Laterality: N/A;   DIAGNOSTIC LAPAROSCOPY     REMOVAL OF SCAR TISSUE (ADHESIONS)   KNEE ARTHROPLASTY Right 08/14/2021   Procedure: COMPUTER ASSISTED TOTAL KNEE ARTHROPLASTY - RNFA;  Surgeon: Dereck Leep, MD;  Location: [facility] ORS;  Service: Orthopedics;  Laterality: Right;   KNEE ARTHROSCOPY Right 10/08/2020   Procedure: ARTHROSCOPY KNEE;  Surgeon: Dereck Leep, MD;  Location: [facility] ORS;  Service: Orthopedics;  Laterality: Right;   KNEE ARTHROSCOPY Right 10/21/2020   Procedure: Arthroscopic irrigation and debridement right knee;  Surgeon: Dereck Leep, MD;  Location: [facility] ORS;  Service: Orthopedics;  Laterality: Right;    Family History  Problem Relation Age of Onset   COPD Father    Hypertension Mother    Aneurysm Mother    Hypertension Brother    Stroke Brother    Cancer Brother        pancreatic   Healthy Brother     Social History   Tobacco Use   Smoking status: Never   Smokeless tobacco: Never   Tobacco comments:    smoking cessation materials not required  Substance Use Topics   Alcohol use: No    Alcohol/week: 0.0 standard drinks     Current Outpatient Medications:    acetaminophen (TYLENOL) 650 MG CR tablet, Take 650 mg by mouth every 8 (eight) hours as needed for pain., Disp: , Rfl:    albuterol (VENTOLIN HFA) 108 (90 Base) MCG/ACT inhaler, INHALE  2 PUFFS INTO THE LUNGS EVERY 6 HOURS AS NEEDED FOR WHEEZING OR SHORTNESS OF BREATH, Disp: 6.7 g, Rfl: 0   amLODipine (NORVASC) 2.5 MG tablet, Take 3 tablets (7.5 mg total) by mouth daily. Take 2 in am and one in pm if bp is below 115/75 (Patient taking differently: Take 5 mg by mouth 2 (two) times daily. Take 2 in am and one in pm if bp is below 115/75), Disp: 270 tablet, Rfl: 0   Calcium-Magnesium-Vitamin D (CALCIUM 1200+D3 PO), Take 1 tablet by mouth 2 (two) times a week. 1200 mg calcium / 1000 units Vitamin D3, Disp: , Rfl:    celecoxib (CELEBREX) 200 MG capsule, Take 1 capsule (200 mg total)  by mouth 2 (two) times daily., Disp: 60 capsule, Rfl: 1   diltiazem (CARDIZEM) 30 MG tablet, Take 1 tablet (30 mg total) by mouth every 6 (six) hours as needed (for tachycardia/recurrent Afib.)., Disp: 30 tablet, Rfl: 3   ELIQUIS 5 MG TABS tablet, TAKE 1 TABLET(5 MG) BY MOUTH TWICE DAILY, Disp: 60 tablet, Rfl: 6   meclizine (ANTIVERT) 25 MG tablet, Take 25 mg by mouth 3 (three) times daily as needed (vertigo/dizziness)., Disp: , Rfl:    oxyCODONE (OXY IR/ROXICODONE) 5 MG immediate release tablet, Take 1 tablet (5 mg total) by mouth every 4 (four) hours as needed for moderate pain (pain score 4-6)., Disp: 30 tablet, Rfl: 0   rosuvastatin (CRESTOR) 10 MG tablet, Take 1 tablet (10 mg total) by mouth at bedtime., Disp: 90 tablet, Rfl: 0   traMADol (ULTRAM) 50 MG tablet, Take 1-2 tablets (50-100 mg total) by mouth every 4 (four) hours as needed for moderate pain., Disp: 30 tablet, Rfl: 0   potassium chloride (KLOR-CON) 10 MEQ tablet, Take 1 tablet (10 mEq total) by mouth daily for 14 days., Disp: 14 tablet, Rfl: 0  Allergies  Allergen Reactions   Ace Inhibitors Swelling    Patient states her tongue swell.   Penicillins Anxiety    IgE = 251 (WNL) on 07/31/2021  Has patient had a PCN reaction causing immediate rash, facial/tongue/throat swelling, SOB or lightheadedness with hypotension: No Has patient had a PCN  reaction causing severe rash involving mucus membranes or skin necrosis: No Has patient had a PCN reaction that required hospitalization No Has patient had a PCN reaction occurring within the last 10 years: No If all of the above answers are "NO", then may proceed with Cephalosporin use.    I personally reviewed active problem list, medication list, allergies, family history, social history, health maintenance with the patient/caregiver today.   ROS  Constitutional: Negative for fever or weight change.  Respiratory: Negative for cough and shortness of breath.   Cardiovascular: Negative for chest pain or palpitations.  Gastrointestinal: Negative for abdominal pain, no bowel changes.  Musculoskeletal: Negative for gait problem but has some right knee  joint swelling - knee surgery .  Skin: Negative for rash.  Neurological: positive for dizziness but no  headache.  No other specific complaints in a complete review of systems (except as listed in HPI above).   Objective  Vitals:   11/19/21 0925  BP: 124/78  Pulse: 81  Resp: 16  SpO2: 98%  Weight: 160 lb (72.6 kg)  Height: '5\' 3"'$  (1.6 m)    Body mass index is 28.34 kg/m.  Physical Exam  Constitutional: Patient appears well-developed and well-nourished. Overweight.  No distress.  HEENT: head atraumatic, normocephalic, pupils equal and reactive to light, neck supple, throat within normal limits Cardiovascular: Normal rate, regular rhythm and normal heart sounds.  No murmur heard. No BLE edema. Pulmonary/Chest: Effort normal and breath sounds normal. No respiratory distress. Abdominal: Soft.  There is no tenderness. Psychiatric: Patient has a normal mood and affect. behavior is normal. Judgment and thought content normal.  Muscular skeletal: doing well with rom of right knee, well healed    PHQ2/9:    11/19/2021    9:25 AM 07/22/2021   11:32 AM 06/10/2021    9:39 AM 05/18/2021    9:59 AM 04/01/2021    9:45 AM  Depression  screen PHQ 2/9  Decreased Interest 0 0 0 0 0  Down, Depressed, Hopeless 0 0 0 0 0  PHQ - 2 Score 0 0 0 0 0  Altered sleeping  0 0 0   Tired, decreased energy  0 0 0   Change in appetite  0 0 0   Feeling bad or failure about yourself   0 0 0   Trouble concentrating  0 0 0   Moving slowly or fidgety/restless  0 0 0   Suicidal thoughts  0 0 0   PHQ-9 Score  0 0 0   Difficult doing work/chores   Not difficult at all Not difficult at all     phq 9 is negative   Fall Risk:    11/19/2021    9:25 AM 07/22/2021   11:32 AM 06/10/2021    9:39 AM 05/20/2021    3:47 PM 05/18/2021    9:53 AM  Fall Risk   Falls in the past year? 0 0 0 0 0  Number falls in past yr: 0 0 0 0 0  Injury with Fall? 0 0 0  0  Risk for fall due to : No Fall Risks No Fall Risks No Fall Risks Impaired balance/gait;Impaired mobility Impaired balance/gait;Impaired mobility  Follow up Falls prevention discussed Falls prevention discussed Falls prevention discussed Falls prevention discussed       Functional Status Survey: Is the patient deaf or have difficulty hearing?: No Does the patient have difficulty seeing, even when wearing glasses/contacts?: No Does the patient have difficulty concentrating, remembering, or making decisions?: No Does the patient have difficulty walking or climbing stairs?: No Does the patient have difficulty dressing or bathing?: No Does the patient have difficulty doing errands alone such as visiting a doctor's office or shopping?: No    Assessment & Plan  Problem List Items Addressed This Visit     Mild intermittent asthma without complication   Paroxysmal atrial fibrillation (HCC)    Sinus rhythm today        Relevant Medications   amLODipine (NORVASC) 2.5 MG tablet   rosuvastatin (CRESTOR) 10 MG tablet   History of CVA (cerebrovascular accident) without residual deficits    On statin and Eliquis       Relevant Medications   rosuvastatin (CRESTOR) 10 MG tablet   Aneurysm  ([place]) - Primary    Placing referral for her to see Dr. Manuella Ghazi again        Relevant Medications   amLODipine (NORVASC) 2.5 MG tablet   rosuvastatin (CRESTOR) 10 MG tablet   Other Relevant Orders   Ambulatory referral to Neurology   Thrombocytosis   Mixed hyperlipidemia   Relevant Medications   amLODipine (NORVASC) 2.5 MG tablet   rosuvastatin (CRESTOR) 10 MG tablet   Atherosclerosis of aorta (HCC)    Continue statin therapy       Relevant Medications   amLODipine (NORVASC) 2.5 MG tablet   rosuvastatin (CRESTOR) 10 MG tablet   Mild protein-calorie malnutrition (HCC)    Weight is stable now , down from high 170's to 180 to 160 lbs, taking Boost shakes       OSA (obstructive sleep apnea)    Not able to tolerate CPAP       Pre-diabetes   Lumbar stenosis with neurogenic claudication    Seen Dr. Phyllis Ginger twice 12/22 and is doing better since epidural injections        Vitamin D deficiency    Taking otc supplementation       Essential hypertension   Relevant Medications   amLODipine (NORVASC) 2.5 MG tablet   rosuvastatin (  CRESTOR) 10 MG tablet   Other Visit Diagnoses     Hypokalemia       Relevant Orders   Potassium   Magnesium

## 2021-11-19 ENCOUNTER — Ambulatory Visit (INDEPENDENT_AMBULATORY_CARE_PROVIDER_SITE_OTHER): Payer: Medicare Other | Admitting: Family Medicine

## 2021-11-19 ENCOUNTER — Encounter: Payer: Self-pay | Admitting: Family Medicine

## 2021-11-19 ENCOUNTER — Ambulatory Visit: Payer: Self-pay | Admitting: *Deleted

## 2021-11-19 ENCOUNTER — Other Ambulatory Visit: Payer: Self-pay

## 2021-11-19 VITALS — BP 124/78 | HR 81 | Resp 16 | Ht 63.0 in | Wt 160.0 lb

## 2021-11-19 DIAGNOSIS — I729 Aneurysm of unspecified site: Secondary | ICD-10-CM

## 2021-11-19 DIAGNOSIS — E559 Vitamin D deficiency, unspecified: Secondary | ICD-10-CM | POA: Insufficient documentation

## 2021-11-19 DIAGNOSIS — E782 Mixed hyperlipidemia: Secondary | ICD-10-CM | POA: Diagnosis not present

## 2021-11-19 DIAGNOSIS — R7303 Prediabetes: Secondary | ICD-10-CM | POA: Insufficient documentation

## 2021-11-19 DIAGNOSIS — E441 Mild protein-calorie malnutrition: Secondary | ICD-10-CM | POA: Diagnosis not present

## 2021-11-19 DIAGNOSIS — Z8673 Personal history of transient ischemic attack (TIA), and cerebral infarction without residual deficits: Secondary | ICD-10-CM | POA: Diagnosis not present

## 2021-11-19 DIAGNOSIS — J4520 Mild intermittent asthma, uncomplicated: Secondary | ICD-10-CM

## 2021-11-19 DIAGNOSIS — I10 Essential (primary) hypertension: Secondary | ICD-10-CM | POA: Diagnosis not present

## 2021-11-19 DIAGNOSIS — I480 Paroxysmal atrial fibrillation: Secondary | ICD-10-CM

## 2021-11-19 DIAGNOSIS — I700 Atherosclerosis of aorta: Secondary | ICD-10-CM | POA: Insufficient documentation

## 2021-11-19 DIAGNOSIS — M48062 Spinal stenosis, lumbar region with neurogenic claudication: Secondary | ICD-10-CM | POA: Insufficient documentation

## 2021-11-19 DIAGNOSIS — G4733 Obstructive sleep apnea (adult) (pediatric): Secondary | ICD-10-CM | POA: Insufficient documentation

## 2021-11-19 DIAGNOSIS — D75839 Thrombocytosis, unspecified: Secondary | ICD-10-CM | POA: Insufficient documentation

## 2021-11-19 DIAGNOSIS — E876 Hypokalemia: Secondary | ICD-10-CM

## 2021-11-19 DIAGNOSIS — N1831 Chronic kidney disease, stage 3a: Secondary | ICD-10-CM | POA: Insufficient documentation

## 2021-11-19 MED ORDER — AMLODIPINE BESYLATE 2.5 MG PO TABS: ORAL_TABLET | ORAL | 0 refills | Status: DC

## 2021-11-19 MED ORDER — ROSUVASTATIN CALCIUM 10 MG PO TABS: 10.0000 mg | ORAL_TABLET | Freq: Every day | ORAL | 1 refills | Status: DC

## 2021-11-19 MED ORDER — AMLODIPINE BESYLATE 2.5 MG PO TABS: 7.5000 mg | ORAL_TABLET | Freq: Every day | ORAL | 0 refills | Status: DC

## 2021-11-19 NOTE — Assessment & Plan Note (Signed)
Continue statin therapy.

## 2021-11-19 NOTE — Assessment & Plan Note (Signed)
Taking otc supplementation

## 2021-11-19 NOTE — Telephone Encounter (Signed)
Summary: pharmacy deletes script for new with clearer instruction   amLODipine (NORVASC) 2.5 MG tablet 270 tablet 0 11/19/2021  Sig - Route: Take 3 tablets (7.5 mg total) by mouth daily. Take 2 in am and one in pm if bp is below Springer new script requested, does not feel this script "Take 2 in am and one in pm if BP is below," is clear direction, deleting and ask to be resent with clearer instructions.   [id number] - [address], [address] AT [facility] [address]  [place] [address]  Phone: [phone] Fax: [phone]  Hours: Not open 24 hours           Call History   Type Contact Phone/Fax User  11/19/2021 11:46 AM EDT Phone (Incoming) [place], [place] AT [place] (Pharmacy) [phone]    Reason for Disposition  [1] Pharmacy calling with prescription question AND [2] triager unable to answer question  Answer Assessment - Initial Assessment Questions 1. NAME of MEDICATION: "What medicine are you calling about?"     See message from [facility] regarding the amlodipine 2. QUESTION: "What is your question?" (e.g., double dose of medicine, side effect)     Requesting a new rx.  The directions are not clear. 3. PRESCRIBING HCP: "Who prescribed it?" Reason: if prescribed by specialist, call should be referred to that group.     Dr. Ancil Boozer 4. SYMPTOMS: "Do you have any symptoms?"     N/A 5. SEVERITY: If symptoms are present, ask "Are they mild, moderate or severe?"     N/A 6. PREGNANCY:  "Is there any chance that you are pregnant?" "When was your last menstrual period?"     N/A  Protocols used: Medication Question Call-A-AH

## 2021-11-19 NOTE — Assessment & Plan Note (Signed)
Sinus rhythm today

## 2021-11-19 NOTE — Assessment & Plan Note (Signed)
On statin and Eliquis

## 2021-11-19 NOTE — Telephone Encounter (Signed)
Spoke with pharmacist to clarify as well as sent in new RX per their request with clear sig per Dr. Ancil Boozer

## 2021-11-19 NOTE — Assessment & Plan Note (Signed)
Not able to tolerate CPAP

## 2021-11-19 NOTE — Assessment & Plan Note (Signed)
Seen Dr. Phyllis Ginger twice 12/22 and is doing better since epidural injections

## 2021-11-19 NOTE — Assessment & Plan Note (Signed)
Weight is stable now , down from high 170's to 180 to 160 lbs, taking Boost shakes

## 2021-11-19 NOTE — Assessment & Plan Note (Signed)
Placing referral for her to see Dr. Manuella Ghazi again

## 2021-11-20 LAB — MAGNESIUM: Magnesium: 2.1 mg/dL (ref 1.5–2.5)

## 2021-11-20 LAB — POTASSIUM: Potassium: 4.6 mmol/L (ref 3.5–5.3)

## 2021-12-23 ENCOUNTER — Ambulatory Visit: Payer: Medicare Other

## 2021-12-23 DIAGNOSIS — E782 Mixed hyperlipidemia: Secondary | ICD-10-CM

## 2021-12-23 DIAGNOSIS — Z9181 History of falling: Secondary | ICD-10-CM

## 2021-12-23 DIAGNOSIS — I10 Essential (primary) hypertension: Secondary | ICD-10-CM

## 2021-12-23 NOTE — Chronic Care Management (AMB) (Signed)
Chronic Care Management   CCM RN Visit Note  12/23/2021 Name: [name] MRN: [id number] DOB: Mar 07, 1941  Subjective: [name] is a 81 y.o. year old female who is a primary care patient of Steele Sizer, MD. The care management team was consulted for assistance with disease management and care coordination needs.    Engaged with patient by telephone for follow up visit in response to provider referral for case management and care coordination services.   Consent to Services:  The patient was given information about Chronic Care Management services, agreed to services, and gave verbal consent prior to initiation of services.  Please see initial visit note for detailed documentation.   Assessment: Review of patient past medical history, allergies, medications, health status, including review of consultants reports, laboratory and other test data, was performed as part of comprehensive evaluation and provision of chronic care management services.   SDOH (Social Determinants of Health) assessments and interventions performed: No  CCM Care Plan  Allergies  Allergen Reactions   Ace Inhibitors Swelling    Patient states her tongue swell.   Penicillins Anxiety    IgE = 251 (WNL) on 07/31/2021  Has patient had a PCN reaction causing immediate rash, facial/tongue/throat swelling, SOB or lightheadedness with hypotension: No Has patient had a PCN reaction causing severe rash involving mucus membranes or skin necrosis: No Has patient had a PCN reaction that required hospitalization No Has patient had a PCN reaction occurring within the last 10 years: No If all of the above answers are "NO", then may proceed with Cephalosporin use.    Outpatient Encounter Medications as of 12/23/2021  Medication Sig Note   acetaminophen (TYLENOL) 650 MG CR tablet Take 650 mg by mouth every 8 (eight) hours as needed for pain.    albuterol (VENTOLIN HFA) 108 (90 Base) MCG/ACT inhaler INHALE 2  PUFFS INTO THE LUNGS EVERY 6 HOURS AS NEEDED FOR WHEEZING OR SHORTNESS OF BREATH    amLODipine (NORVASC) 2.5 MG tablet Take 2 in am ($Remo'5mg'YBWrS$  total) and one in pm (2.$Remove'5mg'OBBYqYn$  total) ONLY if bp is above 120/80    Calcium-Magnesium-Vitamin D (CALCIUM 1200+D3 PO) Take 1 tablet by mouth 2 (two) times a week. 1200 mg calcium / 1000 units Vitamin D3    Cholecalciferol (VITAMIN D) 50 MCG (2000 UT) CAPS Take 1 capsule by mouth daily at 12 noon.    diltiazem (CARDIZEM) 30 MG tablet Take 1 tablet (30 mg total) by mouth every 6 (six) hours as needed (for tachycardia/recurrent Afib.).    ELIQUIS 5 MG TABS tablet TAKE 1 TABLET(5 MG) BY MOUTH TWICE DAILY    Meclizine HCl (BONINE PO) Take by mouth.    rosuvastatin (CRESTOR) 10 MG tablet Take 1 tablet (10 mg total) by mouth at bedtime.    meclizine (ANTIVERT) 25 MG tablet Take 25 mg by mouth 3 (three) times daily as needed (vertigo/dizziness). (Patient not taking: Reported on 12/23/2021) 12/23/2021: Reports no longer taking. Currently taking Bonine   No facility-administered encounter medications on file as of 12/23/2021.    Patient Active Problem List   Diagnosis Date Noted   Thrombocytosis 11/19/2021   Mixed hyperlipidemia 11/19/2021   Atherosclerosis of aorta ([place]) 11/19/2021   Mild protein-calorie malnutrition ([place]) 11/19/2021   OSA (obstructive sleep apnea) 11/19/2021   Pre-diabetes 11/19/2021   Lumbar stenosis with neurogenic claudication 11/19/2021   Vitamin D deficiency 11/19/2021   History of total right knee replacement 08/14/2021   Osteoarthritis of right knee 05/05/2020   History  of colonic polyps    Polyp of transverse colon    Aneurysm ([place]) 02/08/2018   History of CVA (cerebrovascular accident) without residual deficits 01/24/2018   Aneurysm of ophthalmic artery 01/24/2018   Impingement syndrome of shoulder region 01/02/2018   History of cataract surgery, right 09/01/2016   Paroxysmal atrial fibrillation ([place]) 02/24/2016   Vertigo 02/24/2016    Low HDL (under 40) 02/17/2016   Hx of colonic polyps    Benign neoplasm of sigmoid colon    Mild intermittent asthma without complication 49/70/2637   Hyperglycemia 02/21/2015   Essential hypertension 02/20/2015   Inconclusive mammogram due to dense breasts 01/02/2015   Dense breast 01/02/2015   Abnormal EKG 02/23/2013   Patient Care Plan: RN Care Management Plan of Care     Problem Identified: A-Fib, HTN, Diverticulitis, Chronic Joint Pain      Long-Range Goal: Disease Progression Prevented or Minimized Completed 12/23/2021  Start Date: 08/07/2021  Expected End Date: 11/05/2021  Priority: High  Note:   Current Barriers:  Chronic Disease Management support and education needs related to Atrial Fibrillation, HTN, HLD, and Chronic Joint Pain.  RNCM Clinical Goal(s):  Patient will continue to work with RN Care Manager and the care management team to address care management and care coordination needs related to  Atrial Fibrillation, HTN, HLD, and Chronic Joint Pain.  Interventions: 1:1 collaboration with primary care provider regarding development and update of comprehensive plan of care as evidenced by provider attestation and co-signature Inter-disciplinary care team collaboration (see longitudinal plan of care) Evaluation of current treatment plan related to  self management and patient's adherence to plan as established by provider   Fall Risk: (Goal Met) Reviewed safety and fall prevention measures. Denies recent falls. Denies any complications r/t right knee replacement. Reports receiving a cortisone injection in her left knee approximately three weeks ago. Reports adequate pain relief after injection. Reports ambulating well without a cane or walker.  Reports doing well in the home. Completing ADLs and IADL independently. Denies recent changes or decline in activity tolerance. Reports good family support. They are available to assist if needed.  Pain Interventions: (Goal  Met) Reviewed plan for pain management r/t joint pain. She no longer requires medication for discomfort r/t right knee replacement. Reports discussing plan with the Ortho team. She will receive cortisone injections for left knee pain as needed. Reports receiving an injection approximately three weeks ago. Reports she is to schedule follow up if back pain returns.   Hypertension Interventions: (Goal Met) Reviewed hypertension plan. Remains compliant with medications and monitoring BP as advised. Reports readings have been within range. Remains improvement with dietary intake.  Adhering to a cardiac prudent/heart healthy diet. Reviewed s/sx of heart attack, stroke and worsening symptoms that require immediate medical attention.   Patient Goals/Self-Care Activities: Patient will self administer medications as prescribed Patient will attend all scheduled provider appointments Patient will call pharmacy for medication refills Patient will call provider office for new concerns or questions  Goals Met     PLAN: Ms. Gruetzmacher has met her nurse care management goals. She agreed to call if she requires additional outreach. The care management team will gladly assist.   Horris Latino [facility] [phone]

## 2021-12-23 NOTE — Patient Instructions (Signed)
Thank you for allowing the Chronic Care Management team to participate in your care. It was great speaking with you today! Please do not hesitate to contact the clinic if you require additional outreach.   Derek Huneycutt,RN [facility] Care Management [phone]  

## 2022-02-04 ENCOUNTER — Ambulatory Visit (INDEPENDENT_AMBULATORY_CARE_PROVIDER_SITE_OTHER): Payer: Medicare Other

## 2022-02-04 VITALS — BP 128/76 | HR 67 | Temp 97.8°F | Resp 16 | Ht 62.5 in | Wt 166.3 lb

## 2022-02-04 DIAGNOSIS — Z0000 Encounter for general adult medical examination without abnormal findings: Secondary | ICD-10-CM

## 2022-02-04 DIAGNOSIS — Z1231 Encounter for screening mammogram for malignant neoplasm of breast: Secondary | ICD-10-CM | POA: Diagnosis not present

## 2022-02-04 NOTE — Progress Notes (Signed)
Subjective:   [name] is a 81 y.o. female who presents for Medicare Annual (Subsequent) preventive examination.  Review of Systems    Defer to PCP       Objective:    There were no vitals filed for this visit. There is no height or weight on file to calculate BMI.     08/14/2021   10:30 AM 07/31/2021   11:36 AM 05/05/2021    2:42 PM 03/29/2021    9:19 AM 02/03/2021    8:22 AM 10/21/2020    4:16 PM 10/21/2020    7:12 AM  Advanced Directives  Does Patient Have a Medical Advance Directive? Yes Yes No No Yes  Yes  Type of Industrial/product designer of [place];Living will  [place];Living will  Does patient want to make changes to medical advance directive? No - Patient declined No - Patient declined    No - Patient declined   Copy of [place] in Chart? No - copy requested No - copy requested   No - copy requested No - copy requested     Current Medications (verified) Outpatient Encounter Medications as of 02/04/2022  Medication Sig   acetaminophen (TYLENOL) 650 MG CR tablet Take 650 mg by mouth every 8 (eight) hours as needed for pain.   albuterol (VENTOLIN HFA) 108 (90 Base) MCG/ACT inhaler INHALE 2 PUFFS INTO THE LUNGS EVERY 6 HOURS AS NEEDED FOR WHEEZING OR SHORTNESS OF BREATH   amLODipine (NORVASC) 2.5 MG tablet Take 2 in am ('5mg'$  total) and one in pm (2.'5mg'$  total) ONLY if bp is above 120/80   Calcium-Magnesium-Vitamin D (CALCIUM 1200+D3 PO) Take 1 tablet by mouth 2 (two) times a week. 1200 mg calcium / 1000 units Vitamin D3   Cholecalciferol (VITAMIN D) 50 MCG (2000 UT) CAPS Take 1 capsule by mouth daily at 12 noon.   diltiazem (CARDIZEM) 30 MG tablet Take 1 tablet (30 mg total) by mouth every 6 (six) hours as needed (for tachycardia/recurrent Afib.).   ELIQUIS 5 MG TABS tablet TAKE 1 TABLET(5 MG) BY MOUTH TWICE DAILY   meclizine (ANTIVERT) 25 MG tablet Take 25 mg by  mouth 3 (three) times daily as needed (vertigo/dizziness). (Patient not taking: Reported on 12/23/2021)   Meclizine HCl (BONINE PO) Take by mouth.   rosuvastatin (CRESTOR) 10 MG tablet Take 1 tablet (10 mg total) by mouth at bedtime.   No facility-administered encounter medications on file as of 02/04/2022.    Allergies (verified) Ace inhibitors and Penicillins   History: Past Medical History:  Diagnosis Date   History of stress test    a. 02/2013 Nl stress test.   Hypertension    Insomnia, unspecified    Mitral regurgitation    a. echo 01/2016: nl LV sys fxn, mild MR, w/o pulm htn, nl atrial size   Osteoporosis, unspecified    Paroxysmal atrial fibrillation ([place]) 01/2016   a. diagnosed 01/2016; b. has been on eliquis, pradaxa, and xarelto at varying times 2/2 cost of each medication-->currently on eliquis (11/2016)   Pure hypercholesterolemia    Sleep apnea    Unable to tolerate nocturnal PAP therapy   TIA (transient ischemic attack)    Unspecified asthma(493.90)    Vertigo    Past Surgical History:  Procedure Laterality Date   ABDOMINAL HYSTERECTOMY     CATARACT EXTRACTION Bilateral 08/31/2016   COLONOSCOPY WITH PROPOFOL N/A 03/04/2015   Procedure:  COLONOSCOPY WITH PROPOFOL;  Surgeon: Lucilla Lame, MD;  Location: [facility] ENDOSCOPY;  Service: Endoscopy;  Laterality: N/A;   COLONOSCOPY WITH PROPOFOL N/A 04/01/2020   Procedure: COLONOSCOPY WITH PROPOFOL;  Surgeon: Lucilla Lame, MD;  Location: [facility] ENDOSCOPY;  Service: Endoscopy;  Laterality: N/A;   DIAGNOSTIC LAPAROSCOPY     REMOVAL OF SCAR TISSUE (ADHESIONS)   KNEE ARTHROPLASTY Right 08/14/2021   Procedure: COMPUTER ASSISTED TOTAL KNEE ARTHROPLASTY - RNFA;  Surgeon: Dereck Leep, MD;  Location: [facility] ORS;  Service: Orthopedics;  Laterality: Right;   KNEE ARTHROSCOPY Right 10/08/2020   Procedure: ARTHROSCOPY KNEE;  Surgeon: Dereck Leep, MD;  Location: [facility] ORS;  Service: Orthopedics;  Laterality: Right;   KNEE ARTHROSCOPY Right  10/21/2020   Procedure: Arthroscopic irrigation and debridement right knee;  Surgeon: Dereck Leep, MD;  Location: [facility] ORS;  Service: Orthopedics;  Laterality: Right;   Family History  Problem Relation Age of Onset   COPD Father    Hypertension Mother    Aneurysm Mother    Hypertension Brother    Stroke Brother    Cancer Brother        pancreatic   Healthy Brother    Social History   Socioeconomic History   Marital status: Single    Spouse name: Not on file   Number of children: 2   Years of education: Not on file   Highest education level: Associate degree: academic program  Occupational History   Occupation: Retired  Tobacco Use   Smoking status: Never   Smokeless tobacco: Never   Tobacco comments:    smoking cessation materials not required  Vaping Use   Vaping Use: Never used  Substance and Sexual Activity   Alcohol use: No    Alcohol/week: 0.0 standard drinks of alcohol   Drug use: No   Sexual activity: Not Currently    Partners: Male  Other Topics Concern   Not on file  Social History Narrative   Independent at baseline. Lives alone.    Social Determinants of Health   Financial Resource Strain: Medium Risk (04/29/2021)   Overall Financial Resource Strain (CARDIA)    Difficulty of Paying Living Expenses: Somewhat hard  Food Insecurity: No Food Insecurity (02/03/2021)   Hunger Vital Sign    Worried About Running Out of Food in the Last Year: Never true    Ran Out of Food in the Last Year: Never true  Transportation Needs: No Transportation Needs (02/03/2021)   PRAPARE - Hydrologist (Medical): No    Lack of Transportation (Non-Medical): No  Physical Activity: Insufficiently Active (02/03/2021)   Exercise Vital Sign    Days of Exercise per Week: 2 days    Minutes of Exercise per Session: 20 min  Stress: No Stress Concern Present (02/03/2021)   [place]    Feeling  of Stress : Not at all  Social Connections: Moderately Integrated (02/03/2021)   Social Connection and Isolation Panel [NHANES]    Frequency of Communication with Friends and Family: More than three times a week    Frequency of Social Gatherings with Friends and Family: More than three times a week    Attends Religious Services: More than 4 times per year    Active Member of [employer] or Organizations: Yes    Attends Archivist Meetings: More than 4 times per year    Marital Status: Never married    Tobacco Counseling Counseling given: Not  Answered Tobacco comments: smoking cessation materials not required   Clinical Intake:                 Diabetic? N/A         Activities of Daily Living    11/19/2021    9:25 AM 08/14/2021    6:53 PM  In your present state of health, do you have any difficulty performing the following activities:  Hearing? 0 0  Vision? 0 0  Difficulty concentrating or making decisions? 0 0  Walking or climbing stairs? 0 0  Dressing or bathing? 0 0  Doing errands, shopping? 0 0    Patient Care Team: Steele Sizer, MD as PCP - General (Family Medicine) Wellington Hampshire, MD as Consulting Physician (Cardiology) Vladimir Crofts, MD as Consulting Physician (Neurology) Lucilla Lame, MD as Consulting Physician (Gastroenterology) Germaine Pomfret, [facility] (Pharmacist)  Indicate any recent Medical Services you may have received from other than [facility] providers in the past year (date may be approximate).     Assessment:   This is a routine wellness examination for Nadie.  Hearing/Vision screen No results found.  Dietary issues and exercise activities discussed:     Goals Addressed   None   Depression Screen    11/19/2021    9:25 AM 07/22/2021   11:32 AM 06/10/2021    9:39 AM 05/18/2021    9:59 AM 04/01/2021    9:45 AM 02/03/2021    8:21 AM 08/25/2020    8:32 AM  PHQ 2/9 Scores  PHQ - 2 Score 0 0 0 0 0 0 0  PHQ- 9 Score  0 0 0        Fall Risk    11/19/2021    9:25 AM 07/22/2021   11:32 AM 06/10/2021    9:39 AM 05/20/2021    3:47 PM 05/18/2021    9:53 AM  Fall Risk   Falls in the past year? 0 0 0 0 0  Number falls in past yr: 0 0 0 0 0  Injury with Fall? 0 0 0  0  Risk for fall due to : No Fall Risks No Fall Risks No Fall Risks Impaired balance/gait;Impaired mobility Impaired balance/gait;Impaired mobility  Follow up Falls prevention discussed Falls prevention discussed Falls prevention discussed Falls prevention discussed     FALL RISK PREVENTION PERTAINING TO THE HOME:  Any stairs in or around the home? Yes  If so, are there any without handrails? Yes  Home free of loose throw rugs in walkways, pet beds, electrical cords, etc? Yes  Adequate lighting in your home to reduce risk of falls? Yes   ASSISTIVE DEVICES UTILIZED TO PREVENT FALLS:  Life alert? No  Use of a cane, walker or w/c? Yes, Cane s/p knee replacement Grab bars in the bathroom? Yes  Shower chair or bench in shower? Yes  Elevated toilet seat or a handicapped toilet? Yes   TIMED UP AND GO:  Was the test performed? Yes .  Length of time to ambulate 10 feet: 5 sec.   Gait steady and fast without use of assistive device  Cognitive Function:        01/30/2019    9:18 AM 01/24/2018    9:36 AM  6CIT Screen  What Year? 0 points 0 points  What month? 0 points 0 points  What time? 0 points 0 points  Count back from 20 0 points 0 points  Months in reverse 0 points 0 points  Repeat phrase  0 points 2 points  Total Score 0 points 2 points    Immunizations Immunization History  Administered Date(s) Administered   Fluad Quad(high Dose 65+) 04/16/2019, 04/21/2020   Influenza, High Dose Seasonal PF 02/20/2015, 05/11/2017, 05/04/2018, 04/01/2021   Influenza,inj,quad, With Preservative 03/28/2020   Influenza-Unspecified 02/20/2016   PFIZER(Purple Top)SARS-COV-2 Vaccination 07/05/2019, 07/26/2019, 04/23/2020, 12/22/2020, 07/07/2021    Pneumococcal Conjugate-13 10/17/2017   Pneumococcal Polysaccharide-23 05/31/2019   Tdap 02/22/2012, 02/03/2021   Zoster Recombinat (Shingrix) 12/04/2019, 02/08/2020    TDAP status: Up to date  Flu Vaccine status: Up to date  Pneumococcal vaccine status: Up to date  Covid-19 vaccine status: Completed vaccines  Qualifies for Shingles Vaccine? No   Zostavax completed No   Shingrix Completed?: No.    Education has been provided regarding the importance of this vaccine. Patient has been advised to call insurance company to determine out of pocket expense if they have not yet received this vaccine. Advised may also receive vaccine at local pharmacy or Health Dept. Verbalized acceptance and understanding.  Screening Tests Health Maintenance  Topic Date Due   COVID-19 Vaccine (6 - Pfizer series) 09/01/2021   INFLUENZA VACCINE  01/26/2022   MAMMOGRAM  03/19/2022   COLONOSCOPY (Pts 45-60yr Insurance coverage will need to be confirmed)  04/01/2025   TETANUS/TDAP  02/04/2031   Pneumonia Vaccine 79 Years old  Completed   DEXA SCAN  Completed   Hepatitis C Screening  Completed   Zoster Vaccines- Shingrix  Completed   HPV VACCINES  Aged Out    Health Maintenance  Health Maintenance Due  Topic Date Due   COVID-19 Vaccine (6 - Pfizer series) 09/01/2021   INFLUENZA VACCINE  01/26/2022    Colorectal cancer screening: Type of screening: Colonoscopy. Completed 10/05/021. Repeat every 5 years  Mammogram status: Ordered 03/20/2022. Pt provided with contact info and advised to call to schedule appt.   Bone Density status: Completed 03/19/2021. Results reflect: Bone density results: NORMAL. Repeat every 3 years.  Lung Cancer Screening: (Low Dose CT Chest recommended if Age 79-80years, 30 pack-year currently smoking OR have quit w/in 15years.) does not qualify.   Lung Cancer Screening Referral: N/A  Additional Screening:  Hepatitis C Screening: does not qualify; Completed  02/22/2020  Vision Screening: Recommended annual ophthalmology exams for early detection of glaucoma and other disorders of the eye. Is the patient up to date with their annual eye exam?  Yes  Who is the provider or what is the name of the office in which the patient attends annual eye exams? [facility] If pt is not established with a provider, would they like to be referred to a provider to establish care?  N/a .   Dental Screening: Recommended annual dental exams for proper oral hygiene  Community Resource Referral / Chronic Care Management: CRR required this visit?  No   CCM required this visit?  No      Plan:     I have personally reviewed and noted the following in the patient's chart:   Medical and social history Use of alcohol, tobacco or illicit drugs  Current medications and supplements including opioid prescriptions.  Functional ability and status Nutritional status Physical activity Advanced directives List of other physicians Hospitalizations, surgeries, and ER visits in previous 12 months Vitals Screenings to include cognitive, depression, and falls Referrals and appointments  In addition, I have reviewed and discussed with patient certain preventive protocols, quality metrics, and best practice recommendations. A written personalized care plan for  preventive services as well as general preventive health recommendations were provided to patient.     Royal Hawthorn, [place]   02/04/2022   Nurse Notes: Face to Face 51 minutes spent  [name] , Thank you for taking time to come for your Medicare Wellness Visit. I appreciate your ongoing commitment to your health goals. Please review the following plan we discussed and let me know if I can assist you in the future.   These are the goals we discussed:  Goals      DIET - INCREASE WATER INTAKE     Recommend to drink at least 6-8 8oz glasses of water per day.     Track and Manage My Blood  Pressure-Hypertension     Timeframe:  Long-Range Goal Priority:  High Start Date: 04/29/2021                             Expected End Date: 04/29/2022                      Follow Up within 90 days   - check blood pressure weekly    Why is this important?   You won't feel high blood pressure, but it can still hurt your blood vessels.  High blood pressure can cause heart or kidney problems. It can also cause a stroke.  Making lifestyle changes like losing a little weight or eating less salt will help.  Checking your blood pressure at home and at different times of the day can help to control blood pressure.  If the doctor prescribes medicine remember to take it the way the doctor ordered.  Call the office if you cannot afford the medicine or if there are questions about it.     Notes:         This is a list of the screening recommended for you and due dates:  Health Maintenance  Topic Date Due   COVID-19 Vaccine (6 - Pfizer series) 09/01/2021   Flu Shot  09/26/2022*   Mammogram  03/19/2022   Colon Cancer Screening  04/01/2025   Tetanus Vaccine  02/04/2031   Pneumonia Vaccine  Completed   DEXA scan (bone density measurement)  Completed   Hepatitis C Screening: USPSTF Recommendation to screen - Ages 11-79 yo.  Completed   Zoster (Shingles) Vaccine  Completed   HPV Vaccine  Aged Out  *Topic was postponed. The date shown is not the original due date.

## 2022-02-10 ENCOUNTER — Ambulatory Visit: Payer: Self-pay

## 2022-02-10 NOTE — Chronic Care Management (AMB) (Signed)
   02/10/2022  [name] 1941/04/02 [id number]  Documentation encounter created to complete case transition. Patient denies urgent care management needs. The care management team will continue to follow for care coordination as needed.   [facility] [phone]

## 2022-02-15 ENCOUNTER — Other Ambulatory Visit: Payer: Self-pay | Admitting: Family Medicine

## 2022-02-15 DIAGNOSIS — Z1231 Encounter for screening mammogram for malignant neoplasm of breast: Secondary | ICD-10-CM

## 2022-02-18 ENCOUNTER — Other Ambulatory Visit: Payer: Self-pay | Admitting: Family Medicine

## 2022-02-18 DIAGNOSIS — I10 Essential (primary) hypertension: Secondary | ICD-10-CM

## 2022-02-23 ENCOUNTER — Other Ambulatory Visit: Payer: Self-pay | Admitting: Family Medicine

## 2022-02-23 DIAGNOSIS — I10 Essential (primary) hypertension: Secondary | ICD-10-CM

## 2022-03-01 ENCOUNTER — Other Ambulatory Visit: Payer: Self-pay | Admitting: Family Medicine

## 2022-03-01 DIAGNOSIS — I10 Essential (primary) hypertension: Secondary | ICD-10-CM

## 2022-03-22 ENCOUNTER — Ambulatory Visit
Admission: RE | Admit: 2022-03-22 | Discharge: 2022-03-22 | Disposition: A | Discharge status: Home / Self Care | Payer: Medicare Other | Source: Ambulatory Visit | Attending: Family Medicine | Admitting: Family Medicine

## 2022-03-22 DIAGNOSIS — Z1231 Encounter for screening mammogram for malignant neoplasm of breast: Secondary | ICD-10-CM | POA: Diagnosis present

## 2022-04-09 ENCOUNTER — Encounter: Payer: Self-pay | Admitting: Intensive Care

## 2022-04-09 ENCOUNTER — Emergency Department: Payer: Medicare Other

## 2022-04-09 ENCOUNTER — Emergency Department
Admission: EM | Admit: 2022-04-09 | Discharge: 2022-04-09 | Disposition: A | Discharge status: Home / Self Care | Payer: Medicare Other | Attending: Emergency Medicine | Admitting: Emergency Medicine

## 2022-04-09 ENCOUNTER — Other Ambulatory Visit: Payer: Self-pay

## 2022-04-09 DIAGNOSIS — I10 Essential (primary) hypertension: Secondary | ICD-10-CM | POA: Diagnosis not present

## 2022-04-09 DIAGNOSIS — R1110 Vomiting, unspecified: Secondary | ICD-10-CM | POA: Insufficient documentation

## 2022-04-09 DIAGNOSIS — R519 Headache, unspecified: Secondary | ICD-10-CM | POA: Diagnosis not present

## 2022-04-09 DIAGNOSIS — Z20822 Contact with and (suspected) exposure to covid-19: Secondary | ICD-10-CM | POA: Insufficient documentation

## 2022-04-09 DIAGNOSIS — R002 Palpitations: Secondary | ICD-10-CM | POA: Diagnosis not present

## 2022-04-09 DIAGNOSIS — I671 Cerebral aneurysm, nonruptured: Secondary | ICD-10-CM | POA: Diagnosis not present

## 2022-04-09 DIAGNOSIS — I6782 Cerebral ischemia: Secondary | ICD-10-CM | POA: Diagnosis not present

## 2022-04-09 DIAGNOSIS — Z7901 Long term (current) use of anticoagulants: Secondary | ICD-10-CM | POA: Insufficient documentation

## 2022-04-09 DIAGNOSIS — R42 Dizziness and giddiness: Secondary | ICD-10-CM | POA: Diagnosis present

## 2022-04-09 LAB — COMPREHENSIVE METABOLIC PANEL WITH GFR
ALT: 19 U/L (ref 0–44)
AST: 21 U/L (ref 15–41)
Albumin: 4.1 g/dL (ref 3.5–5.0)
Alkaline Phosphatase: 61 U/L (ref 38–126)
Anion gap: 7 (ref 5–15)
BUN: 15 mg/dL (ref 8–23)
CO2: 26 mmol/L (ref 22–32)
Calcium: 9.9 mg/dL (ref 8.9–10.3)
Chloride: 106 mmol/L (ref 98–111)
Creatinine, Ser: 0.89 mg/dL (ref 0.44–1.00)
GFR, Estimated: >60 mL/min
Glucose, Bld: 134 mg/dL — ABNORMAL HIGH (ref 70–99)
Potassium: 3.6 mmol/L (ref 3.5–5.1)
Sodium: 139 mmol/L (ref 135–145)
Total Bilirubin: 0.9 mg/dL (ref 0.3–1.2)
Total Protein: 8.3 g/dL — ABNORMAL HIGH (ref 6.5–8.1)

## 2022-04-09 LAB — CBC WITH DIFFERENTIAL/PLATELET
Abs Immature Granulocytes: 0.01 10*3/uL (ref 0.00–0.07)
Basophils Absolute: 0 10*3/uL (ref 0.0–0.1)
Basophils Relative: 1 %
Eosinophils Absolute: 0.1 10*3/uL (ref 0.0–0.5)
Eosinophils Relative: 1 %
HCT: 45.7 % (ref 36.0–46.0)
Hemoglobin: 15.2 g/dL — ABNORMAL HIGH (ref 12.0–15.0)
Immature Granulocytes: 0 %
Lymphocytes Relative: 17 %
Lymphs Abs: 1.3 10*3/uL (ref 0.7–4.0)
MCH: 30.8 pg (ref 26.0–34.0)
MCHC: 33.3 g/dL (ref 30.0–36.0)
MCV: 92.5 fL (ref 80.0–100.0)
Monocytes Absolute: 0.5 10*3/uL (ref 0.1–1.0)
Monocytes Relative: 6 %
Neutro Abs: 5.4 10*3/uL (ref 1.7–7.7)
Neutrophils Relative %: 75 %
Platelets: 365 10*3/uL (ref 150–400)
RBC: 4.94 MIL/uL (ref 3.87–5.11)
RDW: 12.8 % (ref 11.5–15.5)
WBC: 7.3 10*3/uL (ref 4.0–10.5)
nRBC: 0 % (ref 0.0–0.2)

## 2022-04-09 LAB — RESP PANEL BY RT-PCR (FLU A&B, COVID) ARPGX2
Influenza A by PCR: NEGATIVE
Influenza B by PCR: NEGATIVE
SARS Coronavirus 2 by RT PCR: NEGATIVE

## 2022-04-09 LAB — TROPONIN I (HIGH SENSITIVITY): Troponin I (High Sensitivity): 7 ng/L

## 2022-04-09 MED ORDER — PROMETHAZINE HCL 12.5 MG PO TABS: 6.2500 mg | ORAL_TABLET | Freq: Four times a day (QID) | ORAL | 0 refills | Status: DC | PRN

## 2022-04-09 MED ORDER — LORAZEPAM 2 MG/ML IJ SOLN: 0.5000 mg | Freq: Once | INTRAMUSCULAR | Status: DC

## 2022-04-09 MED ORDER — SODIUM CHLORIDE 0.9 % IV BOLUS
500.0000 mL | Freq: Once | INTRAVENOUS | Status: AC
  Administered 2022-04-09: 500 mL via INTRAVENOUS

## 2022-04-09 MED ORDER — SODIUM CHLORIDE 0.9 % IV SOLN
6.2500 mg | Freq: Four times a day (QID) | INTRAVENOUS | Status: DC | PRN
  Administered 2022-04-09: 6.25 mg via INTRAVENOUS
  Filled 2022-04-09: qty 0.25

## 2022-04-09 NOTE — ED Triage Notes (Addendum)
Patient c/o weakness and dizziness X1 week. C/o right side head pain. Hx TIA and aneurysm. Takes eliquis daily

## 2022-04-09 NOTE — ED Provider Triage Note (Signed)
Emergency Medicine Provider Triage Evaluation Note  IRIA JAMERSON , a 81 y.o. female  was evaluated in triage.  Pt complains of weakness.  Patient has history of a brain aneurysm.  States they are just watching it is very small.  Some dizziness.  States she just feels weak, tired and does not feel well..  Review of Systems  Positive:  Negative:   Physical Exam  BP (!) 144/81 (BP Location: Left Arm)   Pulse 75   Temp 97.8 F (36.6 C) (Oral)   Resp 16   Ht 5' 2.5" (1.588 m)   Wt 77.1 kg   SpO2 95%   BMI 30.60 kg/m  Gen:   Awake, no distress   Resp:  Normal effort  MSK:   Moves extremities without difficulty Other:    Medical Decision Making  Medically screening exam initiated at 10:56 AM.  Appropriate orders placed.  [name] was informed that the remainder of the evaluation will be completed by another provider, this initial triage assessment does not replace that evaluation, and the importance of remaining in the ED until their evaluation is complete.  CT of the head, labs, covid   Versie Starks, PA-C 04/09/22 1057

## 2022-04-09 NOTE — ED Notes (Signed)
Pt assisted to make several calls on ED mobile phone.

## 2022-04-09 NOTE — ED Provider Notes (Signed)
[facility] Provider Note  Patient Contact: 3:53 PM (approximate)   History   Weakness and Dizziness   HPI  [name] is a 81 y.o. female with a history of hypertension, prediabetes, paroxysmal A-fib (currently anticoagulated with Eliquis) and CVA/TIA  with previously identified 3 mm left paraophthalmic ICA aneurysm (last MR angio 2021), under the care of neurologist Dr. Brigitte Pulse for aneurism presents to the emergency department with 1 week of dizziness that occurs when patient goes from a supine to sitting position or from a sitting to standing position with associated right frontal and temporal headache.  Patient states that she has a long history of vertigo which is typically associated with diarrhea and nausea typically managed with promethazine.  However, patient states that she normally does not have headache associated with her vertigo.  She states that she reached out to Dr. Brigitte Pulse who recommended seeking care in the emergency department for MR studies.  Patient denies weakness in the upper and lower extremities.  She denies blurry vision or photophobia.  She reports that the headache developed gradually and mounted in intensity progressively.  Patient states that she has had occasional palpitations but denies chest pain or chest tightness.  She has had several episodes of vomiting since the dizziness started but no associated diarrhea.  She denies abdominal pain or fever.  No falls or other mechanisms of trauma.  She has not started any new medications. Patient lives independently.       Physical Exam   Triage Vital Signs: ED Triage Vitals  Enc Vitals Group     BP 04/09/22 1050 (!) 144/81     Pulse Rate 04/09/22 1050 75     Resp 04/09/22 1050 16     Temp 04/09/22 1050 97.8 F (36.6 C)     Temp Source 04/09/22 1050 Oral     SpO2 04/09/22 1050 95 %     Weight 04/09/22 1053 170 lb (77.1 kg)     Height 04/09/22 1053 5' 2.5" (1.588 m)     Head  Circumference --      Peak Flow --      Pain Score 04/09/22 1052 4     Pain Loc --      Pain Edu? --      Excl. in [place]? --     Most recent vital signs: Vitals:   04/09/22 1830 04/09/22 1900  BP: 137/67 (!) 142/73  Pulse: (!) 58 64  Resp: 18 17  Temp:    SpO2: 96% 98%     General: Alert and in no acute distress. Eyes:  PERRL. EOMI. Head: No acute traumatic findings ENT:      Nose: No congestion/rhinnorhea.      Mouth/Throat: Mucous membranes are moist. Neck: No stridor. No cervical spine tenderness to palpation.  Cardiovascular:  Good peripheral perfusion Respiratory: Normal respiratory effort without tachypnea or retractions. Lungs CTAB. Good air entry to the bases with no decreased or absent breath sounds. Gastrointestinal: Bowel sounds 4 quadrants. Soft and nontender to palpation. No guarding or rigidity. No palpable masses. No distention. No CVA tenderness. Musculoskeletal: Patient has symmetric strength in the upper and lower extremities.  Full range of motion to all extremities.  Neurologic:  No gross focal neurologic deficits are appreciated.  Skin:   No rash noted    ED Results / Procedures / Treatments   Labs (all labs ordered are listed, but only abnormal results are displayed) Labs Reviewed  COMPREHENSIVE METABOLIC PANEL -  Abnormal; Notable for the following components:      Result Value   Glucose, Bld 134 (*)    Total Protein 8.3 (*)    All other components within normal limits  CBC WITH DIFFERENTIAL/PLATELET - Abnormal; Notable for the following components:   Hemoglobin 15.2 (*)    All other components within normal limits  RESP PANEL BY RT-PCR (FLU A&B, COVID) ARPGX2  URINALYSIS, ROUTINE W REFLEX MICROSCOPIC  TROPONIN I (HIGH SENSITIVITY)     EKG  Ventricular rate 81 bpm.  Normal sinus rhythm without ST segment elevation or other apparent arrhythmia.   RADIOLOGY  I personally viewed and evaluated these images as part of my medical decision  making, as well as reviewing the written report by the radiologist.  ED Provider Interpretation:   Chest x-ray: No consolidations, opacities or infiltrates to suggest pneumonia.  No pneumothorax or pneumomediastinum. CT head: No evidence of intracranial bleed or other acute abnormality.   PROCEDURES:  Critical Care performed: No  Procedures   MEDICATIONS ORDERED IN ED: Medications  LORazepam (ATIVAN) injection 0.5 mg (0.5 mg Intravenous Not Given 04/09/22 1850)  promethazine (PHENERGAN) 6.25 mg in sodium chloride 0.9 % 50 mL IVPB (0 mg Intravenous Stopped 04/09/22 2014)  sodium chloride 0.9 % bolus 500 mL (0 mLs Intravenous Stopped 04/09/22 2037)     IMPRESSION / MDM / ASSESSMENT AND PLAN / ED COURSE  I reviewed the triage vital signs and the nursing notes.                             Assessment and plan Dizziness Headache 81 year old female with past medical history stated above presents to the emergency department with right frontal and temporal headache with associated dizziness reproduced with changes in position.  Patient was mildly hypertensive at triage but vital signs were otherwise reassuring.  She had no neurodeficits elicited or identified on on physical exam.  Differential diagnosis includes benign positional vertigo, worsening aneurysm, intracranial bleed, dehydration, atrial fibrillation...   Given atypical headache with negative head CT, will proceed with MRI brain studies.  CBC, CMP and troponin largely within reference range.  Patient was COVID-19 and influenza negative.  Chest x-ray unremarkable.  EKG indicated normal sinus rhythm without ST segment elevation or other apparent arrhythmia.  MR brain without contrast and MR angio brain show 3 mm aneurysm that is consistent with prior imaging.  Patient was given 6.25 mg of Phenergan and she reported that she had some improvement in her dizziness.  She is accompanied by her son and states that she can stay to  meet with him.  Patient was discharged with a short course of Phenergan for nausea and dizziness.  Return precautions were given to return with new or worsening symptoms.  FINAL CLINICAL IMPRESSION(S) / ED DIAGNOSES   Final diagnoses:  Dizziness     Rx / DC Orders   ED Discharge Orders          Ordered    promethazine (PHENERGAN) 12.5 MG tablet  Every 6 hours PRN        04/09/22 2036             Note:  This document was prepared using Dragon voice recognition software and may include unintentional dictation errors.   Vallarie Mare Garnett, PA-C 04/09/22 2039    Duffy Bruce, MD 04/17/22 [phone]

## 2022-04-15 ENCOUNTER — Ambulatory Visit: Payer: Self-pay

## 2022-04-16 ENCOUNTER — Encounter: Payer: Self-pay | Admitting: Family Medicine

## 2022-04-16 ENCOUNTER — Ambulatory Visit (INDEPENDENT_AMBULATORY_CARE_PROVIDER_SITE_OTHER): Payer: Medicare Other | Admitting: Family Medicine

## 2022-04-16 VITALS — BP 132/86 | HR 82 | Resp 16 | Ht 63.0 in | Wt 166.0 lb

## 2022-04-16 DIAGNOSIS — R110 Nausea: Secondary | ICD-10-CM | POA: Diagnosis not present

## 2022-04-16 DIAGNOSIS — M5481 Occipital neuralgia: Secondary | ICD-10-CM | POA: Diagnosis not present

## 2022-04-16 DIAGNOSIS — H8110 Benign paroxysmal vertigo, unspecified ear: Secondary | ICD-10-CM | POA: Diagnosis not present

## 2022-04-16 MED ORDER — PROMETHAZINE HCL 12.5 MG PO TABS: 6.2500 mg | ORAL_TABLET | Freq: Four times a day (QID) | ORAL | 0 refills | Status: DC | PRN

## 2022-04-16 NOTE — Progress Notes (Signed)
Name: [name]   MRN: [id number]    DOB: Jan 23, 1941   Date:04/16/2022       Progress Note  Subjective  Chief Complaint  Dizziness  HPI  Dizziness: she woke up two weeks ago feeling nauseated, dizzy and vomited twice. She tried to see neurologist last week but was advised to go to [facility] , she went to [facility] on 04/09/2022 and had CT , MRI and MRA of the brain that showed sable 3 mm superiorly oriented left paraophthalmic ICA aneurysm, stable chronic small vessell infarct in the left basal ganglia. She states the nausea and dizziness is gradually improving but never lasted this long in the past. She has a history of vertigo and occipital neuralgia and has seen Dr. Manuella Ghazi. She also has a history of Paroxysmal Benign vertigo and years ago had Epely maneuver that help with her symptoms.  She has intermittent allodynia and shooting pain on right side of neck and temporal area   Patient Active Problem List   Diagnosis Date Noted   Thrombocytosis 11/19/2021   Mixed hyperlipidemia 11/19/2021   Atherosclerosis of aorta ([place]) 11/19/2021   Mild protein-calorie malnutrition ([place]) 11/19/2021   OSA (obstructive sleep apnea) 11/19/2021   Pre-diabetes 11/19/2021   Lumbar stenosis with neurogenic claudication 11/19/2021   Vitamin D deficiency 11/19/2021   History of total right knee replacement 08/14/2021   Osteoarthritis of right knee 05/05/2020   History of colonic polyps    Polyp of transverse colon    Aneurysm ([place]) 02/08/2018   History of CVA (cerebrovascular accident) without residual deficits 01/24/2018   Aneurysm of ophthalmic artery 01/24/2018   Impingement syndrome of shoulder region 01/02/2018   History of cataract surgery, right 09/01/2016   Paroxysmal atrial fibrillation ([place]) 02/24/2016   Vertigo 02/24/2016   Low HDL (under 40) 02/17/2016   Hx of colonic polyps    Benign neoplasm of sigmoid colon    Mild intermittent asthma without complication 23/53/6144   Hyperglycemia 02/21/2015    Essential hypertension 02/20/2015   Inconclusive mammogram due to dense breasts 01/02/2015   Dense breast 01/02/2015   Abnormal EKG 02/23/2013    Past Surgical History:  Procedure Laterality Date   ABDOMINAL HYSTERECTOMY     CATARACT EXTRACTION Bilateral 08/31/2016   COLONOSCOPY WITH PROPOFOL N/A 03/04/2015   Procedure: COLONOSCOPY WITH PROPOFOL;  Surgeon: Lucilla Lame, MD;  Location: [facility] ENDOSCOPY;  Service: Endoscopy;  Laterality: N/A;   COLONOSCOPY WITH PROPOFOL N/A 04/01/2020   Procedure: COLONOSCOPY WITH PROPOFOL;  Surgeon: Lucilla Lame, MD;  Location: [facility] ENDOSCOPY;  Service: Endoscopy;  Laterality: N/A;   DIAGNOSTIC LAPAROSCOPY     REMOVAL OF SCAR TISSUE (ADHESIONS)   KNEE ARTHROPLASTY Right 08/14/2021   Procedure: COMPUTER ASSISTED TOTAL KNEE ARTHROPLASTY - RNFA;  Surgeon: Dereck Leep, MD;  Location: [facility] ORS;  Service: Orthopedics;  Laterality: Right;   KNEE ARTHROSCOPY Right 10/08/2020   Procedure: ARTHROSCOPY KNEE;  Surgeon: Dereck Leep, MD;  Location: [facility] ORS;  Service: Orthopedics;  Laterality: Right;   KNEE ARTHROSCOPY Right 10/21/2020   Procedure: Arthroscopic irrigation and debridement right knee;  Surgeon: Dereck Leep, MD;  Location: [facility] ORS;  Service: Orthopedics;  Laterality: Right;    Family History  Problem Relation Age of Onset   COPD Father    Hypertension Mother    Aneurysm Mother    Hypertension Brother    Stroke Brother    Cancer Brother        pancreatic   Healthy Brother  Social History   Tobacco Use   Smoking status: Never   Smokeless tobacco: Never   Tobacco comments:    smoking cessation materials not required  Substance Use Topics   Alcohol use: No    Alcohol/week: 0.0 standard drinks of alcohol     Current Outpatient Medications:    acetaminophen (TYLENOL) 650 MG CR tablet, Take 650 mg by mouth every 8 (eight) hours as needed for pain., Disp: , Rfl:    albuterol (VENTOLIN HFA) 108 (90 Base) MCG/ACT inhaler, INHALE  2 PUFFS INTO THE LUNGS EVERY 6 HOURS AS NEEDED FOR WHEEZING OR SHORTNESS OF BREATH, Disp: 6.7 g, Rfl: 0   amLODipine (NORVASC) 2.5 MG tablet, TAKE 2 TABLETS BY MOUTH IN THE MORNING AND 1 TABLET IN THE EVENING, ONLY IF BLOOD PRESSURE IS ABOVE 120/80, Disp: 270 tablet, Rfl: 0   atorvastatin (LIPITOR) 40 MG tablet, TK 1 T PO QD AT 6 PM, Disp: , Rfl:    Calcium-Magnesium-Vitamin D (CALCIUM 1200+D3 PO), Take 1 tablet by mouth 2 (two) times a week. 1200 mg calcium / 1000 units Vitamin D3, Disp: , Rfl:    Cholecalciferol (VITAMIN D) 50 MCG (2000 UT) CAPS, Take 1 capsule by mouth daily at 12 noon., Disp: , Rfl:    cloNIDine (CATAPRES) 0.1 MG tablet, , Disp: , Rfl:    diltiazem (CARDIZEM) 30 MG tablet, Take 1 tablet (30 mg total) by mouth every 6 (six) hours as needed (for tachycardia/recurrent Afib.)., Disp: 30 tablet, Rfl: 3   diltiazem (CARDIZEM) 30 MG tablet, Take by mouth., Disp: , Rfl:    ELIQUIS 5 MG TABS tablet, TAKE 1 TABLET(5 MG) BY MOUTH TWICE DAILY, Disp: 60 tablet, Rfl: 6   Meclizine HCl (BONINE PO), Take by mouth., Disp: , Rfl:    rosuvastatin (CRESTOR) 10 MG tablet, Take 1 tablet (10 mg total) by mouth at bedtime., Disp: 90 tablet, Rfl: 1   tiZANidine (ZANAFLEX) 2 MG tablet, , Disp: , Rfl:    meclizine (ANTIVERT) 25 MG tablet, Take 25 mg by mouth 3 (three) times daily as needed (vertigo/dizziness). (Patient not taking: Reported on 02/04/2022), Disp: , Rfl:    promethazine (PHENERGAN) 12.5 MG tablet, Take 0.5 tablets (6.25 mg total) by mouth every 6 (six) hours as needed for up to 5 days for nausea or vomiting., Disp: 10 tablet, Rfl: 0  Allergies  Allergen Reactions   Ace Inhibitors Swelling    Patient states her tongue swell.   Penicillins Anxiety    IgE = 251 (WNL) on 07/31/2021  Has patient had a PCN reaction causing immediate rash, facial/tongue/throat swelling, SOB or lightheadedness with hypotension: No Has patient had a PCN reaction causing severe rash involving mucus membranes or  skin necrosis: No Has patient had a PCN reaction that required hospitalization No Has patient had a PCN reaction occurring within the last 10 years: No If all of the above answers are "NO", then may proceed with Cephalosporin use.    I personally reviewed active problem list, medication list, allergies, family history, social history, health maintenance with the patient/caregiver today.   ROS  Ten systems reviewed and is negative except as mentioned in HPI   Objective  Vitals:   04/16/22 0757  Resp: 16  SpO2: 98%  Weight: 166 lb (75.3 kg)  Height: '5\' 3"'$  (1.6 m)    Body mass index is 29.41 kg/m.  Physical Exam  Constitutional: Patient appears well-developed and well-nourished.  No distress.  HEENT: head atraumatic, normocephalic, pupils equal and reactive  to light, ears normal  TM, neck supple, throat within normal limits Cardiovascular: Normal rate, regular rhythm and normal heart sounds.  No murmur heard. No BLE edema. Pulmonary/Chest: Effort normal and breath sounds normal. No respiratory distress. Abdominal: Soft.  There is no tenderness. Neurological exam: slow gait, afraid to move her head , normal strength and sensation, no nystagmus  Psychiatric: Patient has a normal mood and affect. behavior is normal. Judgment and thought content normal.    PHQ2/9:    04/16/2022    7:57 AM 02/04/2022    8:23 AM 11/19/2021    9:25 AM 07/22/2021   11:32 AM 06/10/2021    9:39 AM  Depression screen PHQ 2/9  Decreased Interest 0 0 0 0 0  Down, Depressed, Hopeless 0 0 0 0 0  PHQ - 2 Score 0 0 0 0 0  Altered sleeping 1 0  0 0  Tired, decreased energy 0 0  0 0  Change in appetite 1 0  0 0  Feeling bad or failure about yourself  0 0  0 0  Trouble concentrating 0 0  0 0  Moving slowly or fidgety/restless 0 0  0 0  Suicidal thoughts 0 0  0 0  PHQ-9 Score 2 0  0 0  Difficult doing work/chores     Not difficult at all    phq 9 is negative   Fall Risk:    04/16/2022    7:57 AM  02/04/2022    8:26 AM 11/19/2021    9:25 AM 07/22/2021   11:32 AM 06/10/2021    9:39 AM  Fall Risk   Falls in the past year? 0 0 0 0 0  Number falls in past yr: 0  0 0 0  Injury with Fall? 0  0 0 0  Risk for fall due to : Impaired balance/gait No Fall Risks No Fall Risks No Fall Risks No Fall Risks  Follow up Falls prevention discussed Falls prevention discussed;Education provided;Falls evaluation completed Falls prevention discussed Falls prevention discussed Falls prevention discussed      Functional Status Survey: Is the patient deaf or have difficulty hearing?: No Does the patient have difficulty seeing, even when wearing glasses/contacts?: No Does the patient have difficulty concentrating, remembering, or making decisions?: No Does the patient have difficulty walking or climbing stairs?: Yes Does the patient have difficulty dressing or bathing?: No Does the patient have difficulty doing errands alone such as visiting a doctor's office or shopping?: No    Assessment & Plan  1. Occipital neuralgia of right side  Discussed importance of follow up with neurologist, Dr. Manuella Ghazi   2. Benign paroxysmal positional vertigo, unspecified laterality  - Ambulatory referral to ENT

## 2022-05-12 ENCOUNTER — Other Ambulatory Visit: Payer: Self-pay | Admitting: Cardiovascular Disease

## 2022-05-12 DIAGNOSIS — I480 Paroxysmal atrial fibrillation: Secondary | ICD-10-CM

## 2022-05-12 NOTE — Telephone Encounter (Addendum)
Prescription refill request for Eliquis received. Indication:  PAF Last office visit: 02/19/21  Rod Can MD Scr: 0.89 on 04/09/22 Age: 81 Weight: 77.1kg  Based on above findings Eliquis '5mg'$  twice daily is the appropriate dose.  Refill approved.  Patient is past due for appt with Dr Fletcher Anon.  Message sent to schedulers to make appt.

## 2022-05-12 NOTE — Therapy (Signed)
OUTPATIENT PHYSICAL THERAPY NEURO/VESTIBULAR EVALUATION   Patient Name: [name] MRN: [id number] DOB:10/07/1940, 81 y.o., female Today's Date: 05/14/2022   PCP: Steele Sizer, MD REFERRING PROVIDER: Vladimir Crofts, MD  END OF SESSION:  PT End of Session - 05/14/22 0823     Visit Number 1    Number of Visits 25    Date for PT Re-Evaluation 08/05/22    PT Start Time 0930    PT Stop Time 1046    PT Time Calculation (min) 76 min    Equipment Utilized During Treatment Gait belt    Activity Tolerance Patient tolerated treatment well    Behavior During Therapy WFL for tasks assessed/performed             Past Medical History:  Diagnosis Date   History of stress test    a. 02/2013 Nl stress test.   Hypertension    Insomnia, unspecified    Mitral regurgitation    a. echo 01/2016: nl LV sys fxn, mild MR, w/o pulm htn, nl atrial size   Osteoporosis, unspecified    Paroxysmal atrial fibrillation ([place]) 01/2016   a. diagnosed 01/2016; b. has been on eliquis, pradaxa, and xarelto at varying times 2/2 cost of each medication-->currently on eliquis (11/2016)   Pure hypercholesterolemia    Sleep apnea    Unable to tolerate nocturnal PAP therapy   TIA (transient ischemic attack)    Unspecified asthma(493.90)    Vertigo    Past Surgical History:  Procedure Laterality Date   ABDOMINAL HYSTERECTOMY     CATARACT EXTRACTION Bilateral 08/31/2016   COLONOSCOPY WITH PROPOFOL N/A 03/04/2015   Procedure: COLONOSCOPY WITH PROPOFOL;  Surgeon: Lucilla Lame, MD;  Location: [facility] ENDOSCOPY;  Service: Endoscopy;  Laterality: N/A;   COLONOSCOPY WITH PROPOFOL N/A 04/01/2020   Procedure: COLONOSCOPY WITH PROPOFOL;  Surgeon: Lucilla Lame, MD;  Location: [facility] ENDOSCOPY;  Service: Endoscopy;  Laterality: N/A;   DIAGNOSTIC LAPAROSCOPY     REMOVAL OF SCAR TISSUE (ADHESIONS)   KNEE ARTHROPLASTY Right 08/14/2021   Procedure: COMPUTER ASSISTED TOTAL KNEE ARTHROPLASTY - RNFA;  Surgeon: Dereck Leep, MD;  Location: [facility] ORS;  Service: Orthopedics;  Laterality: Right;   KNEE ARTHROSCOPY Right 10/08/2020   Procedure: ARTHROSCOPY KNEE;  Surgeon: Dereck Leep, MD;  Location: [facility] ORS;  Service: Orthopedics;  Laterality: Right;   KNEE ARTHROSCOPY Right 10/21/2020   Procedure: Arthroscopic irrigation and debridement right knee;  Surgeon: Dereck Leep, MD;  Location: [facility] ORS;  Service: Orthopedics;  Laterality: Right;   Patient Active Problem List   Diagnosis Date Noted   Thrombocytosis 11/19/2021   Mixed hyperlipidemia 11/19/2021   Atherosclerosis of aorta ([place]) 11/19/2021   Mild protein-calorie malnutrition ([place]) 11/19/2021   OSA (obstructive sleep apnea) 11/19/2021   Pre-diabetes 11/19/2021   Lumbar stenosis with neurogenic claudication 11/19/2021   Vitamin D deficiency 11/19/2021   History of total right knee replacement 08/14/2021   Osteoarthritis of right knee 05/05/2020   History of colonic polyps    Polyp of transverse colon    Aneurysm ([place]) 02/08/2018   History of CVA (cerebrovascular accident) without residual deficits 01/24/2018   Aneurysm of ophthalmic artery 01/24/2018   Impingement syndrome of shoulder region 01/02/2018   History of cataract surgery, right 09/01/2016   Paroxysmal atrial fibrillation ([place]) 02/24/2016   Vertigo 02/24/2016   Low HDL (under 40) 02/17/2016   Hx of colonic polyps    Benign neoplasm of sigmoid colon    Mild intermittent asthma without complication  02/21/2015   Hyperglycemia 02/21/2015   Essential hypertension 02/20/2015   Inconclusive mammogram due to dense breasts 01/02/2015   Dense breast 01/02/2015   Abnormal EKG 02/23/2013    ONSET DATE: Recent onset 04/02/2022, however, has had vertigo on and off for 30+ years, neck/head/shoulder pain >6 months ago  REFERRING DIAG: occipital neuralgia  THERAPY DIAG:  Cervicalgia  Dizziness and giddiness  Chronic right shoulder pain  Unsteadiness on feet  Difficulty in walking, not  elsewhere classified  Muscle weakness (generalized)  Rationale for Evaluation and Treatment: Rehabilitation  SUBJECTIVE:                                                                                                                                                                                             SUBJECTIVE STATEMENT: Pt presents to PT eval for ongoing R side head/neck/shoulder pain (diagnosed with occipital neuralgia, per chart) and for chronic recurrent vertigo of 30+ years. Pt has had "maneuvers" performed in the past, one as recently 4 weeks ago. She reports this has helped her 1x, but otherwise has not resolved her symptoms. She would like to address both her chronic pain and vertigo.  She reports when she has a vertigo attack it makes her very sick, she throws up and has diarrhea during episodes. These episodes can last for weeks, and her vertigo can last for hours. The only thing that helps is laying down. She can wake up with vertigo, can have spontaneous onset without movement. Pt reports she does not have migraines, no changes in her hearing or vision. Denies history of concussion or falls with hitting her head. She reports no ringing in ears or sense of fullness. She has not felt lightheaded. She now reports general unsteadiness and has difficulty walking around in the dark. She reports no falls in last six months. Turning her head quickly, looking up to place something in her cabinets, and bending over makes her unsteady. To manage symptoms she takes Tylenol for her head pain. She does not currently take Meclizine, but used to. Has other medication to manage nausea.  She request to not perform any positional testing or treatment maneuvers today because she does not want to feel "sick for the holiday." PT discusses with pt contacting her physician about medication management so positional testing can be completed in future session. Pt agreeable to plan.   Pt accompanied by:  self  PERTINENT HISTORY:   PT seen in ER 04/09/2022 due to dizziness and headaches with occ sharp pain in R temporal region, with nausea with initial symptom onset 04/02/2022 per chart. Pt diagnosed with occipital neuralgia, last  seen by neuro 04/22/2022 where pt was still having dizziness and headaches. Per note Manuella Ghazi pt with stable 3 mm aneurysm. Does not feel necessary to repeat imaging at this time unless pt experiences symptoms. Work-up was negative for stroke. Per referral pt with hx of severe vertigo. Pt would like to address both vertigo and chronic pain.  PMH: sleep apnea, R meniscus tear (2021), a fib, HTN, Stroke TIA, R knee arthroscopy, parital medial meniscectomy and chondroplasty (2022), R knee arthroscopy, arthroscopic irrigation and debridement 2022, R total knee arthrolasty 2023, benign neoplasm of sigmoid colon, aneurysm of ophthalmic artery, impingement syndrome of shoulder region, atherosclerosis of aorta, OSA, pre-diabetes, vitamin d deficiency  PAIN:  Are you having pain?  R shoulder soreness. Frequently has  R sided temple/head/neck and shoulder pain  PRECAUTIONS: Fall  WEIGHT BEARING RESTRICTIONS: No  FALLS: Has patient fallen in last 6 months? No  LIVING ENVIRONMENT: Lives with: lives alone Lives in: House/apartment Stairs: No Has following equipment at home: Single point cane, Environmental consultant - 2 wheeled, Wheelchair (manual), shower chair, and Grab bars  PLOF: Independent  PATIENT GOALS: I just want to get rid of this vertigo, improve balance  OBJECTIVE:   DIAGNOSTIC FINDINGS:   04/09/22 MR ANGIO HEAD WO CONTRAST "IMPRESSION: 1. No acute intracranial process. 2. No intracranial large vessel occlusion or significant stenosis. 3. Redemonstrated 3 mm superiorly oriented left paraophthalmic ICA aneurysm."  04/09/22 MR BRAIN WO CONTRAST "IMPRESSION: 1. No acute intracranial process. 2. No intracranial large vessel occlusion or significant stenosis. 3. Redemonstrated  3 mm superiorly oriented left paraophthalmic ICA aneurysm."  04/09/2022: "CT HEAD WO CONTRAST IMPRESSION: No evidence of acute intracranial abnormality.   Redemonstrated chronic small-vessel infarct in the left basal ganglia.   Mild chronic small vessel image changes within the cerebral white matter."    COGNITION: Overall cognitive status: Within functional limits for tasks assessed   SENSATION: Per pt reports no N/T  COORDINATION: WNL  POSTURE: increased thoracic kyphosis  Cervical ROM: Lat Flex - R 35 deg *dizziness with R lat flex, L 30 deg Cervical Rotation - R 48 deg. L 45 deg Cerv Flex 25 deg and pain-limited Cerv Ext 35 deg and pain-limited    MMT: complete next visit  BED MOBILITY:  Occ limitation due to vertigo  STAIRS: Level of Assistance: CGA Stair Negotiation Technique: Step to Pattern descending with Bilateral Rails Number of Stairs: 4   GAIT: Gait pattern:  increased variability in BOS, has to use step-strategy to correct for unsteadiness. Distance walked: clinic distances, 10MWT Assistive device utilized: None Level of assistance: CGA Comments: see 10MWT for full details  Oculomotor testing: Vergence- sees double 6" away Smooth pursuits- smooth, however, pt with increased blinking and feels "I have to refocus" Saccades- Testing makes pt feel off, pt closes eyes throughout Ocular ROM- WNL Spontaneous nystagmus- none on exam End-gaze nystagmus- none on exam   Slow VOR - one very prominent corrective saccade observed, another possible small corrective saccade. No dizziness. Pt reports she felt it was difficult to keep focused on the target "it's like my eyes want to move."   FUNCTIONAL TESTS:  10 meter walk test: 0.77 m/s, increased variability in BOS, has to use step-strategy to correct for unsteadiness. Dynamic Gait Index: 18/24  PATIENT SURVEYS:  FOTO 64 (goal per FOTO 65) NDI: to be completed next 1-2 visits DHI: 56, indicates moderate  perception of handicap  TODAY'S TREATMENT:  DATE:   Attempt at VORx1, horizontal head turns, seated, 2x30 sec, pt unable to achieve correct speed likely due to cervical stiffness and measured cervical impairment  Cervical rotation stretch - 1x30 sec each side, performed seated. Issued stretch for HEP, instructed to perform in pain-free range. Pt verbalized understanding    PATIENT EDUCATION: Education details: Exam findings, plan, goals, HEP Person educated: Patient Education method: Explanation, Demonstration, Verbal cues, and Handouts Education comprehension: verbalized understanding, returned demonstration, and needs further education  HOME EXERCISE PROGRAM: Access Code: [email or web address] URL: [email or web address] Date: 05/13/2022 Prepared by: Ricard Dillon  Exercises - Seated Cervical Rotation Stretch  - 1 x daily - 7 x weekly - 2 sets - 2 reps - 30 sec hold  GOALS: Goals reviewed with patient? Yes  SHORT TERM GOALS: Target date: 06/24/2022  The pt will be independent with her HEP in order to decrease pain, and to improve balance, functional mobility, and ease and safety with her ADLs. Baseline: initiated Goal status: INITIAL  LONG TERM GOALS: Target date: 08/05/2022  The pt will improve FOTO score equal to or greater than    to demonstrate clinically significant improvement in QOL. Baseline:  Goal status: INITIAL  2.  The patient will increase dynamic gait index score to >19/24 to demonstrate reduced fall risk and improved dynamic gait balance for better safety with community/home ambulation Baseline: 18 Goal status: INITIAL  3.  The patient will reduce NDI score to <10% to demonstrate minimal disability with ADLs including improved sleeping tolerance, sitting tolerance, etc for better mobility at home.   Baseline:  Goal status:  INITIAL  4.  The patient will reduce dizziness handicap inventory score to <40 for less dizziness with ADLs and increased safety with home and community tasks/activities.  Baseline: 56 Goal status: INITIAL    ASSESSMENT:  CLINICAL IMPRESSION: Patient is a pleasant 81 y.o. female who was seen today for physical therapy evaluation and treatment for pain due to occipital neuralgia and chronic recurrent dizziness/vertigo. Examination reveals pain, impaired cervical ROM, impaired gait and balance, as well as abnormal findings on oculomotor exam (vergence, unusual sensations with saccades and smooth pursuit). DHI and FOTO scores indicate impaired functional mobility, QOL and increased perception of handicap due to dizziness. Positional testing deferred per pt request today due to reported history of significant symptoms, including n/v with testing. PT recommends pt discuss possible medication options with her MD to assist her in completing these tests. Pt verbalized understanding and is agreeable to this plan. The pt will benefit from further skilled PT to address these impairments in order to increase QOL, decrease pain, manage dizziness symptoms, and decrease fall risk.   OBJECTIVE IMPAIRMENTS: Abnormal gait, decreased balance, decreased coordination, decreased knowledge of use of DME, decreased mobility, difficulty walking, decreased ROM, dizziness, hypomobility, impaired flexibility, impaired vision/preception, improper body mechanics, postural dysfunction, and pain.   ACTIVITY LIMITATIONS: lifting, bending, squatting, stairs, bed mobility, and locomotion level  PARTICIPATION LIMITATIONS: meal prep, cleaning, laundry, driving, shopping, community activity, occupation, and yard work  PERSONAL FACTORS: Age, Fitness, Past/current experiences, Sex, Time since onset of injury/illness/exacerbation, and 3+ comorbidities: PMH: sleep apnea, R meniscus tear (2021), a fib, HTN, Stroke TIA, R knee arthroscopy,  parital medial meniscectomy and chondroplasty (2022), R knee arthroscopy, arthroscopic irrigation and debridement 2022, R total knee arthrolasty 2023, benign neoplasm of sigmoid colon, aneurysm of ophthalmic artery, impingement syndrome of shoulder region, atherosclerosis of aorta, OSA, pre-diabetes, vitamin d deficiency  are also affecting patient's functional outcome.  REHAB POTENTIAL: Fair    CLINICAL DECISION MAKING: Evolving/moderate complexity  EVALUATION COMPLEXITY: Moderate  PLAN:  PT FREQUENCY: 2x/week  PT DURATION: 12 weeks  PLANNED INTERVENTIONS: Therapeutic exercises, Therapeutic activity, Neuromuscular re-education, Balance training, Gait training, Patient/Family education, Self Care, Joint mobilization, Stair training, Vestibular training, Canalith repositioning, Visual/preceptual remediation/compensation, Orthotic/Fit training, DME instructions, Dry Needling, Electrical stimulation, Wheelchair mobility training, Spinal mobilization, Cryotherapy, Moist heat, Taping, Traction, Ultrasound, Manual therapy, and Re-evaluation  PLAN FOR NEXT SESSION: complete NDI, MMT, positional testing as pt able, manual therapy, balance, VOR   Zollie Pee, PT 05/14/2022, 8:25 AM

## 2022-05-12 NOTE — Telephone Encounter (Signed)
Refill request

## 2022-05-13 ENCOUNTER — Ambulatory Visit: Payer: Medicare Other | Attending: Neurology

## 2022-05-13 DIAGNOSIS — M6281 Muscle weakness (generalized): Secondary | ICD-10-CM | POA: Diagnosis present

## 2022-05-13 DIAGNOSIS — R2681 Unsteadiness on feet: Secondary | ICD-10-CM | POA: Diagnosis present

## 2022-05-13 DIAGNOSIS — M25511 Pain in right shoulder: Secondary | ICD-10-CM | POA: Insufficient documentation

## 2022-05-13 DIAGNOSIS — R262 Difficulty in walking, not elsewhere classified: Secondary | ICD-10-CM | POA: Insufficient documentation

## 2022-05-13 DIAGNOSIS — M542 Cervicalgia: Secondary | ICD-10-CM | POA: Insufficient documentation

## 2022-05-13 DIAGNOSIS — R42 Dizziness and giddiness: Secondary | ICD-10-CM | POA: Insufficient documentation

## 2022-05-13 DIAGNOSIS — G8929 Other chronic pain: Secondary | ICD-10-CM | POA: Insufficient documentation

## 2022-05-18 ENCOUNTER — Ambulatory Visit: Payer: Medicare Other

## 2022-05-19 NOTE — Progress Notes (Signed)
Name: [name]   MRN: [id number]    DOB: 03-15-1941   Date:05/24/2022       Progress Note  Subjective  Chief Complaint  Follow Up  HPI  HTN: bp is at goal no chest pain , seldom has palpitation but no SOB BP better since taking norvasc to 5 mg in am and 2.5 mg in pm , doing well on current regiment   Pre-diabetes: last dropped from 6.1 % to 5.7 %  Denies polyphagia, polydipsia or polyuria . She tries to cut down on desserts , we will stop checking A1C unless she has symptoms since it is unlikely she will become a diabetic   Paroxysmal Afib: sees Dr. Fletcher Anon, symptoms started August 2017, she is on Eliquis daily  and prn Cardizem. She is due for follow up, she bruises easily / senile purpura likely worse due to blood thinners     History of CVA/TIA and also aneursym of left para ophthalmic artery , under the care of Dr. Manuella Ghazi - neurologist.  She is now taking Crestor and denies side effects.. She has occasional right frontal headache and soreness on right side of neck and per Dr. Trena Platt note states occipital neuralgia but is doing better now    MR angio head without contrast from 01/23/2020 compared with previous one done  09/08/2018:   IMPRESSION: Stable appearance of 3 mm left paraophthalmic ICA aneurysm.   No new aneurysm or high-grade narrowing.   Asthma: she states symptoms are controlled a this time. Only taking albuterol prn and at less than once a month Unchanged   OSA: seen by Dr. Manuella Ghazi - neurologist , unable to tolerate CPAP, sleep study done 06/30/2018 , she was supposed to have an oral appliance., unable to replaced it due to cost .Unchanged    Vertigo: she has vertigo with head movement, motion sickness, and when she has an episode she needs to go to [facility] by EMS because of severe nausea, vomiting and diarrhea 06/2016. She had Summer of 2020 but resolved by itself . Last flare was Oct 2023, she had one visit to vestibular rehab and is going back today for follow up, doing  much better, occasionally has dizziness  Malnutrition: : she started a diet   Fall 2021, and lost 13 lbs in 6 months, however she stopped diet and continue to lose weight, she had lost another  13 lbs  from 06/2020 to Feb 2022  We check multiple labs 05/2021 , negative CRP, normal comp panel, no anemia, white count back to normal. Had slight elevated of platelets and also sed rage that had improved down from 80's to 60 and while at [facility] 02/23 down to 33 . Her weight is finally trending up a little . Her baseline weight was upper 170's to 180 lbs, it went up to 160's and is now back to baseline , she states she does not want to gain anymore weight   Bilateral knee pain: she had right knee replacement Feb 2023, completed PT, not ready to have surgery on the left knee yet . Unchanged   Low back pain with radiculitis to left side: had to go to [facility] end of 2022, saw Dr. Sharlet Salina and had two epidural injections, pain on left resolved, noticed pain on posterior thigh last night but resolved with extra strength Tylenol . Stable   Atherosclerosis of aorta: found on CT abdomen done 10/22, continue eliquis and statin therapy , only side effects is senile purpura is a  little worse   Patient Active Problem List   Diagnosis Date Noted   Senile purpura ([place]) 05/24/2022   Benign paroxysmal positional vertigo 05/24/2022   Thrombocytosis 11/19/2021   Mixed hyperlipidemia 11/19/2021   Atherosclerosis of aorta ([place]) 11/19/2021   Mild protein-calorie malnutrition ([place]) 11/19/2021   OSA (obstructive sleep apnea) 11/19/2021   Pre-diabetes 11/19/2021   Lumbar stenosis with neurogenic claudication 11/19/2021   Vitamin D deficiency 11/19/2021   History of total right knee replacement 08/14/2021   Osteoarthritis of right knee 05/05/2020   History of colonic polyps    Polyp of transverse colon    Aneurysm ([place]) 02/08/2018   History of CVA (cerebrovascular accident) without residual deficits 01/24/2018   Aneurysm of  ophthalmic artery 01/24/2018   Impingement syndrome of shoulder region 01/02/2018   History of cataract surgery, right 09/01/2016   Paroxysmal atrial fibrillation ([place]) 02/24/2016   Vertigo 02/24/2016   Low HDL (under 40) 02/17/2016   Hx of colonic polyps    Benign neoplasm of sigmoid colon    Mild intermittent asthma without complication 82/50/5397   Hyperglycemia 02/21/2015   Essential hypertension 02/20/2015   Inconclusive mammogram due to dense breasts 01/02/2015   Dense breast 01/02/2015   Abnormal EKG 02/23/2013    Past Surgical History:  Procedure Laterality Date   ABDOMINAL HYSTERECTOMY     CATARACT EXTRACTION Bilateral 08/31/2016   COLONOSCOPY WITH PROPOFOL N/A 03/04/2015   Procedure: COLONOSCOPY WITH PROPOFOL;  Surgeon: Lucilla Lame, MD;  Location: [facility] ENDOSCOPY;  Service: Endoscopy;  Laterality: N/A;   COLONOSCOPY WITH PROPOFOL N/A 04/01/2020   Procedure: COLONOSCOPY WITH PROPOFOL;  Surgeon: Lucilla Lame, MD;  Location: [facility] ENDOSCOPY;  Service: Endoscopy;  Laterality: N/A;   DIAGNOSTIC LAPAROSCOPY     REMOVAL OF SCAR TISSUE (ADHESIONS)   KNEE ARTHROPLASTY Right 08/14/2021   Procedure: COMPUTER ASSISTED TOTAL KNEE ARTHROPLASTY - RNFA;  Surgeon: Dereck Leep, MD;  Location: [facility] ORS;  Service: Orthopedics;  Laterality: Right;   KNEE ARTHROSCOPY Right 10/08/2020   Procedure: ARTHROSCOPY KNEE;  Surgeon: Dereck Leep, MD;  Location: [facility] ORS;  Service: Orthopedics;  Laterality: Right;   KNEE ARTHROSCOPY Right 10/21/2020   Procedure: Arthroscopic irrigation and debridement right knee;  Surgeon: Dereck Leep, MD;  Location: [facility] ORS;  Service: Orthopedics;  Laterality: Right;    Family History  Problem Relation Age of Onset   COPD Father    Hypertension Mother    Aneurysm Mother    Hypertension Brother    Stroke Brother    Cancer Brother        pancreatic   Healthy Brother     Social History   Tobacco Use   Smoking status: Never   Smokeless tobacco:  Never   Tobacco comments:    smoking cessation materials not required  Substance Use Topics   Alcohol use: No    Alcohol/week: 0.0 standard drinks of alcohol     Current Outpatient Medications:    acetaminophen (TYLENOL) 650 MG CR tablet, Take 650 mg by mouth every 8 (eight) hours as needed for pain., Disp: , Rfl:    albuterol (VENTOLIN HFA) 108 (90 Base) MCG/ACT inhaler, INHALE 2 PUFFS INTO THE LUNGS EVERY 6 HOURS AS NEEDED FOR WHEEZING OR SHORTNESS OF BREATH, Disp: 6.7 g, Rfl: 0   amLODipine (NORVASC) 2.5 MG tablet, TAKE 2 TABLETS BY MOUTH IN THE MORNING AND 1 TABLET IN THE EVENING, ONLY IF BLOOD PRESSURE IS ABOVE 120/80, Disp: 270 tablet, Rfl: 0   apixaban (ELIQUIS) 5  MG TABS tablet, TAKE 1 TABLET(5 MG) BY MOUTH TWICE DAILY, Disp: 60 tablet, Rfl: 1   Calcium-Magnesium-Vitamin D (CALCIUM 1200+D3 PO), Take 1 tablet by mouth 2 (two) times a week. 1200 mg calcium / 1000 units Vitamin D3, Disp: , Rfl:    cloNIDine (CATAPRES) 0.1 MG tablet, , Disp: , Rfl:    diltiazem (CARDIZEM) 30 MG tablet, Take 1 tablet (30 mg total) by mouth every 6 (six) hours as needed (for tachycardia/recurrent Afib.)., Disp: 30 tablet, Rfl: 3   rosuvastatin (CRESTOR) 10 MG tablet, Take 1 tablet (10 mg total) by mouth at bedtime., Disp: 90 tablet, Rfl: 1   Cholecalciferol (VITAMIN D) 50 MCG (2000 UT) CAPS, Take 1 capsule by mouth daily at 12 noon. (Patient not taking: Reported on 05/24/2022), Disp: , Rfl:    promethazine (PHENERGAN) 12.5 MG tablet, Take 0.5 tablets (6.25 mg total) by mouth every 6 (six) hours as needed for up to 5 days for nausea or vomiting., Disp: 10 tablet, Rfl: 0  Allergies  Allergen Reactions   Ace Inhibitors Swelling    Patient states her tongue swell.   Penicillins Anxiety    IgE = 251 (WNL) on 07/31/2021  Has patient had a PCN reaction causing immediate rash, facial/tongue/throat swelling, SOB or lightheadedness with hypotension: No Has patient had a PCN reaction causing severe rash involving  mucus membranes or skin necrosis: No Has patient had a PCN reaction that required hospitalization No Has patient had a PCN reaction occurring within the last 10 years: No If all of the above answers are "NO", then may proceed with Cephalosporin use.    I personally reviewed active problem list, medication list, allergies, family history, social history, health maintenance with the patient/caregiver today.   ROS  Ten systems reviewed and is negative except as mentioned in HPI   Objective  Vitals:   05/24/22 1026  BP: 126/80  Pulse: 78  Resp: 16  Temp: 97.7 F (36.5 C)  TempSrc: Oral  SpO2: 98%  Weight: 170 lb 3.2 oz (77.2 kg)  Height: '5\' 2"'$  (1.575 m)    Body mass index is 31.13 kg/m.  Physical Exam  Constitutional: Patient appears well-developed and well-nourished. Obese  No distress.  HEENT: head atraumatic, normocephalic, pupils equal and reactive to light,, neck supple Cardiovascular: Normal rate, regular rhythm and normal heart sounds.  No murmur heard. No BLE edema. Pulmonary/Chest: Effort normal and breath sounds normal. No respiratory distress. Abdominal: Soft.  There is no tenderness. Psychiatric: Patient has a normal mood and affect. behavior is normal. Judgment and thought content normal.    PHQ2/9:    05/24/2022   10:28 AM 04/16/2022    7:57 AM 02/04/2022    8:23 AM 11/19/2021    9:25 AM 07/22/2021   11:32 AM  Depression screen PHQ 2/9  Decreased Interest 1 0 0 0 0  Down, Depressed, Hopeless 0 0 0 0 0  PHQ - 2 Score 1 0 0 0 0  Altered sleeping 1 1 0  0  Tired, decreased energy 0 0 0  0  Change in appetite 1 1 0  0  Feeling bad or failure about yourself  0 0 0  0  Trouble concentrating 0 0 0  0  Moving slowly or fidgety/restless 0 0 0  0  Suicidal thoughts 0 0 0  0  PHQ-9 Score 3 2 0  0  Difficult doing work/chores Not difficult at all        phq 9 is negative  Fall Risk:    05/24/2022   10:28 AM 04/16/2022    7:57 AM 02/04/2022    8:26 AM  11/19/2021    9:25 AM 07/22/2021   11:32 AM  Fall Risk   Falls in the past year? 0 0 0 0 0  Number falls in past yr:  0  0 0  Injury with Fall?  0  0 0  Risk for fall due to :  Impaired balance/gait No Fall Risks No Fall Risks No Fall Risks  Follow up Falls prevention discussed;Education provided;Falls evaluation completed Falls prevention discussed Falls prevention discussed;Education provided;Falls evaluation completed Falls prevention discussed Falls prevention discussed      Functional Status Survey: Is the patient deaf or have difficulty hearing?: No Does the patient have difficulty seeing, even when wearing glasses/contacts?: No Does the patient have difficulty concentrating, remembering, or making decisions?: No Does the patient have difficulty walking or climbing stairs?: No Does the patient have difficulty dressing or bathing?: No Does the patient have difficulty doing errands alone such as visiting a doctor's office or shopping?: No    Assessment & Plan  1. Paroxysmal atrial fibrillation (HCC)  Rate controlled, due to follow up with Dr. Fletcher Anon   2. Senile purpura ([place])  Reassurance given  3. Aneurysm ([place])  On eliquis and statin therapy   4. Atherosclerosis of aorta (HCC)  - Lipid panel  5. Vitamin D deficiency  Resume Vitamin D   6. Lumbar stenosis with neurogenic claudication   7. Occipital neuralgia of right side  Keep follow up with Dr. Manuella Ghazi  8. Need for immunization against influenza  - Flu Vaccine QUAD High Dose(Fluad)  9. Benign paroxysmal positional vertigo, unspecified laterality  Continue vestibular rehab  10. OSA (obstructive sleep apnea)  Cannot afford machine  11. History of CVA (cerebrovascular accident) without residual deficits  Taking  medication   12. Essential hypertension  - amLODipine (NORVASC) 2.5 MG tablet; TAKE 2 TABLETS BY MOUTH IN THE MORNING AND 1 TABLET IN THE EVENING, ONLY IF BLOOD PRESSURE IS ABOVE 120/80   Dispense: 270 tablet; Refill: 0

## 2022-05-24 ENCOUNTER — Ambulatory Visit: Payer: Medicare Other

## 2022-05-24 ENCOUNTER — Ambulatory Visit (INDEPENDENT_AMBULATORY_CARE_PROVIDER_SITE_OTHER): Payer: Medicare Other | Admitting: Family Medicine

## 2022-05-24 ENCOUNTER — Encounter: Payer: Self-pay | Admitting: Family Medicine

## 2022-05-24 VITALS — BP 126/80 | HR 78 | Temp 97.7°F | Resp 16 | Ht 62.0 in | Wt 170.2 lb

## 2022-05-24 DIAGNOSIS — M5481 Occipital neuralgia: Secondary | ICD-10-CM | POA: Diagnosis not present

## 2022-05-24 DIAGNOSIS — D692 Other nonthrombocytopenic purpura: Secondary | ICD-10-CM

## 2022-05-24 DIAGNOSIS — I480 Paroxysmal atrial fibrillation: Secondary | ICD-10-CM | POA: Diagnosis not present

## 2022-05-24 DIAGNOSIS — E559 Vitamin D deficiency, unspecified: Secondary | ICD-10-CM

## 2022-05-24 DIAGNOSIS — R42 Dizziness and giddiness: Secondary | ICD-10-CM

## 2022-05-24 DIAGNOSIS — I700 Atherosclerosis of aorta: Secondary | ICD-10-CM | POA: Diagnosis not present

## 2022-05-24 DIAGNOSIS — Z8673 Personal history of transient ischemic attack (TIA), and cerebral infarction without residual deficits: Secondary | ICD-10-CM | POA: Diagnosis not present

## 2022-05-24 DIAGNOSIS — M48062 Spinal stenosis, lumbar region with neurogenic claudication: Secondary | ICD-10-CM | POA: Diagnosis not present

## 2022-05-24 DIAGNOSIS — I729 Aneurysm of unspecified site: Secondary | ICD-10-CM | POA: Diagnosis not present

## 2022-05-24 DIAGNOSIS — G4733 Obstructive sleep apnea (adult) (pediatric): Secondary | ICD-10-CM | POA: Diagnosis not present

## 2022-05-24 DIAGNOSIS — H8110 Benign paroxysmal vertigo, unspecified ear: Secondary | ICD-10-CM | POA: Insufficient documentation

## 2022-05-24 DIAGNOSIS — Z23 Encounter for immunization: Secondary | ICD-10-CM

## 2022-05-24 DIAGNOSIS — I10 Essential (primary) hypertension: Secondary | ICD-10-CM | POA: Diagnosis not present

## 2022-05-24 DIAGNOSIS — M542 Cervicalgia: Secondary | ICD-10-CM | POA: Diagnosis not present

## 2022-05-24 DIAGNOSIS — M6281 Muscle weakness (generalized): Secondary | ICD-10-CM

## 2022-05-24 MED ORDER — AMLODIPINE BESYLATE 2.5 MG PO TABS: ORAL_TABLET | ORAL | 0 refills | Status: DC

## 2022-05-24 NOTE — Therapy (Signed)
OUTPATIENT PHYSICAL THERAPY NEURO VESTIBULAR TREATMENT   Patient Name: [name] MRN: [id number] DOB:[birth year], 81 y.o., female Today's Date: 05/24/2022   PCP: Steele Sizer, MD REFERRING PROVIDER: Vladimir Crofts, MD  END OF SESSION:  PT End of Session - 05/24/22 1614     Visit Number 2    Number of Visits 25    Date for PT Re-Evaluation 08/05/22    PT Start Time 1602    PT Stop Time 7654    PT Time Calculation (min) 45 min    Equipment Utilized During Treatment Gait belt    Activity Tolerance Patient tolerated treatment well    Behavior During Therapy WFL for tasks assessed/performed              Past Medical History:  Diagnosis Date   History of stress test    a. 02/2013 Nl stress test.   Hypertension    Insomnia, unspecified    Mitral regurgitation    a. echo 01/2016: nl LV sys fxn, mild MR, w/o pulm htn, nl atrial size   Osteoporosis, unspecified    Paroxysmal atrial fibrillation ([place]) 01/2016   a. diagnosed 01/2016; b. has been on eliquis, pradaxa, and xarelto at varying times 2/2 cost of each medication-->currently on eliquis (11/2016)   Pure hypercholesterolemia    Sleep apnea    Unable to tolerate nocturnal PAP therapy   TIA (transient ischemic attack)    Unspecified asthma(493.90)    Vertigo    Past Surgical History:  Procedure Laterality Date   ABDOMINAL HYSTERECTOMY     CATARACT EXTRACTION Bilateral 08/31/2016   COLONOSCOPY WITH PROPOFOL N/A 03/04/2015   Procedure: COLONOSCOPY WITH PROPOFOL;  Surgeon: Lucilla Lame, MD;  Location: [facility] ENDOSCOPY;  Service: Endoscopy;  Laterality: N/A;   COLONOSCOPY WITH PROPOFOL N/A 04/01/2020   Procedure: COLONOSCOPY WITH PROPOFOL;  Surgeon: Lucilla Lame, MD;  Location: [facility] ENDOSCOPY;  Service: Endoscopy;  Laterality: N/A;   DIAGNOSTIC LAPAROSCOPY     REMOVAL OF SCAR TISSUE (ADHESIONS)   KNEE ARTHROPLASTY Right 08/14/2021   Procedure: COMPUTER ASSISTED TOTAL KNEE ARTHROPLASTY - RNFA;  Surgeon: Dereck Leep, MD;  Location: [facility] ORS;  Service: Orthopedics;  Laterality: Right;   KNEE ARTHROSCOPY Right 10/08/2020   Procedure: ARTHROSCOPY KNEE;  Surgeon: Dereck Leep, MD;  Location: [facility] ORS;  Service: Orthopedics;  Laterality: Right;   KNEE ARTHROSCOPY Right 10/21/2020   Procedure: Arthroscopic irrigation and debridement right knee;  Surgeon: Dereck Leep, MD;  Location: [facility] ORS;  Service: Orthopedics;  Laterality: Right;   Patient Active Problem List   Diagnosis Date Noted   Senile purpura ([place]) 05/24/2022   Benign paroxysmal positional vertigo 05/24/2022   Thrombocytosis 11/19/2021   Mixed hyperlipidemia 11/19/2021   Atherosclerosis of aorta ([place]) 11/19/2021   Mild protein-calorie malnutrition ([place]) 11/19/2021   OSA (obstructive sleep apnea) 11/19/2021   Pre-diabetes 11/19/2021   Lumbar stenosis with neurogenic claudication 11/19/2021   Vitamin D deficiency 11/19/2021   History of total right knee replacement 08/14/2021   Osteoarthritis of right knee 05/05/2020   History of colonic polyps    Polyp of transverse colon    Aneurysm ([place]) 02/08/2018   History of CVA (cerebrovascular accident) without residual deficits 01/24/2018   Aneurysm of ophthalmic artery 01/24/2018   Impingement syndrome of shoulder region 01/02/2018   History of cataract surgery, right 09/01/2016   Paroxysmal atrial fibrillation ([place]) 02/24/2016   Vertigo 02/24/2016   Low HDL (under 40) 02/17/2016   Hx of colonic polyps  Benign neoplasm of sigmoid colon    Mild intermittent asthma without complication 25/42/7062   Hyperglycemia 02/21/2015   Essential hypertension 02/20/2015   Inconclusive mammogram due to dense breasts 01/02/2015   Dense breast 01/02/2015   Abnormal EKG 02/23/2013    ONSET DATE: Recent onset 04/02/2022, however, has had vertigo on and off for 30+ years, neck/head/shoulder pain >6 months ago  REFERRING DIAG: occipital neuralgia  THERAPY DIAG:  Cervicalgia  Dizziness and  giddiness  Muscle weakness (generalized)  Rationale for Evaluation and Treatment: Rehabilitation  SUBJECTIVE:                                                                                                                                                                                             SUBJECTIVE STATEMENT: Pt reports no vertigo. She reports some dizziness when she's laying in the bed and gets up. She also reports occ spinning if she turns over in the bed.    Pt accompanied by: self  PERTINENT HISTORY:   PT seen in ER 04/09/2022 due to dizziness and headaches with occ sharp pain in R temporal region, with nausea with initial symptom onset 04/02/2022 per chart. Pt diagnosed with occipital neuralgia, last seen by neuro 04/22/2022 where pt was still having dizziness and headaches. Per note Manuella Ghazi pt with stable 3 mm aneurysm. Does not feel necessary to repeat imaging at this time unless pt experiences symptoms. Work-up was negative for stroke. Per referral pt with hx of severe vertigo. Pt would like to address both vertigo and chronic pain.  Pt presents to PT eval for ongoing R side head/neck/shoulder pain (diagnosed with occipital neuralgia, per chart) and for chronic recurrent vertigo of 30+ years. Pt has had "maneuvers" performed in the past, one as recently 4 weeks ago. She reports this has helped her 1x, but otherwise has not resolved her symptoms. She would like to address both her chronic pain and vertigo.  She reports when she has a vertigo attack it makes her very sick, she throws up and has diarrhea during episodes. These episodes can last for weeks, and her vertigo can last for hours. The only thing that helps is laying down. She can wake up with vertigo, can have spontaneous onset without movement. Pt reports she does not have migraines, no changes in her hearing or vision. Denies history of concussion or falls with hitting her head. She reports no ringing in ears or sense of  fullness. She has not felt lightheaded. She now reports general unsteadiness and has difficulty walking around in the dark. She reports no falls in last six months. Turning her head quickly,  looking up to place something in her cabinets, and bending over makes her unsteady. To manage symptoms she takes Tylenol for her head pain. She does not currently take Meclizine, but used to. Has other medication to manage nausea.  She request to not perform any positional testing or treatment maneuvers today because she does not want to feel "sick for the holiday." PT discusses with pt contacting her physician about medication management so positional testing can be completed in future session. Pt agreeable to plan.  PMH: sleep apnea, R meniscus tear (2021), a fib, HTN, Stroke TIA, R knee arthroscopy, parital medial meniscectomy and chondroplasty (2022), R knee arthroscopy, arthroscopic irrigation and debridement 2022, R total knee arthrolasty 2023, benign neoplasm of sigmoid colon, aneurysm of ophthalmic artery, impingement syndrome of shoulder region, atherosclerosis of aorta, OSA, pre-diabetes, vitamin d deficiency  PAIN:  Are you having pain?  R shoulder soreness. Frequently has  R sided temple/head/neck and shoulder pain  PRECAUTIONS: Fall  WEIGHT BEARING RESTRICTIONS: No  FALLS: Has patient fallen in last 6 months? No  LIVING ENVIRONMENT: Lives with: lives alone Lives in: House/apartment Stairs: No Has following equipment at home: Single point cane, Environmental consultant - 2 wheeled, Wheelchair (manual), shower chair, and Grab bars  PLOF: Independent  PATIENT GOALS: I just want to get rid of this vertigo, improve balance  OBJECTIVE:   DIAGNOSTIC FINDINGS:   04/09/22 MR ANGIO HEAD WO CONTRAST "IMPRESSION: 1. No acute intracranial process. 2. No intracranial large vessel occlusion or significant stenosis. 3. Redemonstrated 3 mm superiorly oriented left paraophthalmic ICA aneurysm."  04/09/22 MR BRAIN WO  CONTRAST "IMPRESSION: 1. No acute intracranial process. 2. No intracranial large vessel occlusion or significant stenosis. 3. Redemonstrated 3 mm superiorly oriented left paraophthalmic ICA aneurysm."  04/09/2022: "CT HEAD WO CONTRAST IMPRESSION: No evidence of acute intracranial abnormality.   Redemonstrated chronic small-vessel infarct in the left basal ganglia.   Mild chronic small vessel image changes within the cerebral white matter."    COGNITION: Overall cognitive status: Within functional limits for tasks assessed   SENSATION: Per pt reports no N/T  COORDINATION: WNL  POSTURE: increased thoracic kyphosis  Cervical ROM: Lat Flex - R 35 deg *dizziness with R lat flex, L 30 deg Cervical Rotation - R 48 deg. L 45 deg Cerv Flex 25 deg and pain-limited Cerv Ext 35 deg and pain-limited    LEX:[id number] 4/5 BLE  BED MOBILITY:  Occ limitation due to vertigo  STAIRS: Level of Assistance: CGA Stair Negotiation Technique: Step to Pattern descending with Bilateral Rails Number of Stairs: 4   GAIT: Gait pattern:  increased variability in BOS, has to use step-strategy to correct for unsteadiness. Distance walked: clinic distances, 10MWT Assistive device utilized: None Level of assistance: CGA Comments: see 10MWT for full details  Oculomotor testing: Vergence- sees double 6" away Smooth pursuits- smooth, however, pt with increased blinking and feels "I have to refocus" Saccades- Testing makes pt feel off, pt closes eyes throughout Ocular ROM- WNL Spontaneous nystagmus- none on exam End-gaze nystagmus- none on exam   Slow VOR - one very prominent corrective saccade observed, another possible small corrective saccade. No dizziness. Pt reports she felt it was difficult to keep focused on the target "it's like my eyes want to move."   FUNCTIONAL TESTS:  10 meter walk test: 0.77 m/s, increased variability in BOS, has to use step-strategy to correct for  unsteadiness. Dynamic Gait Index: 18/24  PATIENT SURVEYS:  FOTO 64 (goal per FOTO 65) NDI: 38% DHI:  56, indicates moderate perception of handicap   Positional testing: Roll testing -negative B DH - negative for nystagmus, pt did report brief <1 sec feeling of "turning" immediately with laying back into position on B sides, but this resolved immediately  Pt reports feeling back to baseline after testing completed. Instructed in post-testing precautions   TODAY'S TREATMENT:                                                                                                                               NDI 38% indicated moderate disability  LE MMT - grossly 4/5 BLE - greatest deficits in hip musculature   Positional Testing Roll testing -negative DH - negative for nystagmus, pt did report brief <1 sec feeling of "turning" immediately with laying back into position on B sides, but this resolved immediately  Pt reports feeling back to baseline after testing completed. Instructed in post-testing precautions   DVA - somewhat limited in speed of testing by cervical stiffness (no pain, however), this might be affecting result of no line change - able to read line 9 with and without head movement. Pt did report dizziness "was not quite severe but right under it."  Cervical rotation stretch - 2x30 sec each side, performed seated. Issued stretch for HEP, instructed to perform in pain-free range. Pt verbalized understanding  Slow AROM cervical rotation 10x B   Standing vorx1, horizontal head turns 30 sec Seated vorx1, horizontal head turns 30 sec   PATIENT EDUCATION: Education details: further assessment findings, indications, exercise technique Person educated: Patient Education method: Explanation, Demonstration, Verbal cues, and Handouts Education comprehension: verbalized understanding, returned demonstration, and needs further education  HOME EXERCISE PROGRAM: Updated to add 11/27:  Access Code: [email or web address] URL: [email or web address] Date: 05/24/2022 Prepared by: Ricard Dillon  Exercises - Seated Gaze Stabilization with Head Rotation  - 1 x daily - 7 x weekly - 3 sets - 2 reps - 30 seconds hold  Access Code: [email or web address] URL: [email or web address] Date: 05/13/2022 Prepared by: Ricard Dillon  Exercises - Seated Cervical Rotation Stretch  - 1 x daily - 7 x weekly - 2 sets - 2 reps - 30 sec hold  GOALS: Goals reviewed with patient? Yes  SHORT TERM GOALS: Target date: 06/24/2022  The pt will be independent with her HEP in order to decrease pain, and to improve balance, functional mobility, and ease and safety with her ADLs. Baseline: initiated Goal status: INITIAL  LONG TERM GOALS: Target date: 08/05/2022  The pt will improve FOTO score equal to or greater than    to demonstrate clinically significant improvement in QOL. Baseline:  Goal status: INITIAL  2.  The patient will increase dynamic gait index score to >19/24 to demonstrate reduced fall risk and improved dynamic gait balance for better safety with community/home ambulation Baseline: 18 Goal status: INITIAL  3.  The patient will reduce NDI score to <10% to demonstrate minimal disability with ADLs including improved sleeping tolerance,  sitting tolerance, etc for better mobility at home.   Baseline:  Goal status: INITIAL  4.  The patient will reduce dizziness handicap inventory score to <40 for less dizziness with ADLs and increased safety with home and community tasks/activities.  Baseline: 56 Goal status: INITIAL    ASSESSMENT:  CLINICAL IMPRESSION: Pt with negative positional testing: no nystagmus, but did report <1 sec feeling of vertigo immediately with laying into DH position B sides. Provided pt with seated VORx1 for HEP and continued focus on improving cervical mobility. The pt will benefit from further skilled PT to address these impairments in order to increase QOL,  decrease pain, manage dizziness symptoms, and decrease fall risk.   OBJECTIVE IMPAIRMENTS: Abnormal gait, decreased balance, decreased coordination, decreased knowledge of use of DME, decreased mobility, difficulty walking, decreased ROM, dizziness, hypomobility, impaired flexibility, impaired vision/preception, improper body mechanics, postural dysfunction, and pain.   ACTIVITY LIMITATIONS: lifting, bending, squatting, stairs, bed mobility, and locomotion level  PARTICIPATION LIMITATIONS: meal prep, cleaning, laundry, driving, shopping, community activity, occupation, and yard work  PERSONAL FACTORS: Age, Fitness, Past/current experiences, Sex, Time since onset of injury/illness/exacerbation, and 3+ comorbidities: PMH: sleep apnea, R meniscus tear (2021), a fib, HTN, Stroke TIA, R knee arthroscopy, parital medial meniscectomy and chondroplasty (2022), R knee arthroscopy, arthroscopic irrigation and debridement 2022, R total knee arthrolasty 2023, benign neoplasm of sigmoid colon, aneurysm of ophthalmic artery, impingement syndrome of shoulder region, atherosclerosis of aorta, OSA, pre-diabetes, vitamin d deficiency  are also affecting patient's functional outcome.   REHAB POTENTIAL: Fair    CLINICAL DECISION MAKING: Evolving/moderate complexity  EVALUATION COMPLEXITY: Moderate  PLAN:  PT FREQUENCY: 2x/week  PT DURATION: 12 weeks  PLANNED INTERVENTIONS: Therapeutic exercises, Therapeutic activity, Neuromuscular re-education, Balance training, Gait training, Patient/Family education, Self Care, Joint mobilization, Stair training, Vestibular training, Canalith repositioning, Visual/preceptual remediation/compensation, Orthotic/Fit training, DME instructions, Dry Needling, Electrical stimulation, Wheelchair mobility training, Spinal mobilization, Cryotherapy, Moist heat, Taping, Traction, Ultrasound, Manual therapy, and Re-evaluation  PLAN FOR NEXT SESSION: balance, manual therapy cervical  spine, VOR   Zollie Pee, PT 05/24/2022, 5:06 PM

## 2022-05-24 NOTE — Patient Instructions (Signed)
RSV vaccine can be taken with COVID booster

## 2022-05-25 LAB — LIPID PANEL
Cholesterol: 111 mg/dL
HDL: 50 mg/dL
LDL Cholesterol (Calc): 46 mg/dL
Non-HDL Cholesterol (Calc): 61 mg/dL
Total CHOL/HDL Ratio: 2.2 (calc)
Triglycerides: 66 mg/dL

## 2022-05-26 ENCOUNTER — Ambulatory Visit: Payer: Medicare Other

## 2022-05-31 ENCOUNTER — Ambulatory Visit: Payer: Medicare Other

## 2022-06-01 ENCOUNTER — Other Ambulatory Visit: Payer: Self-pay | Admitting: Family Medicine

## 2022-06-01 DIAGNOSIS — I10 Essential (primary) hypertension: Secondary | ICD-10-CM

## 2022-06-03 ENCOUNTER — Ambulatory Visit: Payer: Medicare Other | Attending: Neurology

## 2022-06-03 DIAGNOSIS — M542 Cervicalgia: Secondary | ICD-10-CM | POA: Insufficient documentation

## 2022-06-03 DIAGNOSIS — M6281 Muscle weakness (generalized): Secondary | ICD-10-CM | POA: Insufficient documentation

## 2022-06-03 NOTE — Therapy (Signed)
OUTPATIENT PHYSICAL THERAPY NEURO VESTIBULAR TREATMENT   Patient Name: [name] MRN: [id number] DOB:05-14-1941, 81 y.o., female Today's Date: 06/03/2022   PCP: Steele Sizer, MD REFERRING PROVIDER: Vladimir Crofts, MD  END OF SESSION:  PT End of Session - 06/03/22 1540     Visit Number 3    Number of Visits 25    Date for PT Re-Evaluation 08/05/22    PT Start Time 1546    PT Stop Time 1630    PT Time Calculation (min) 44 min    Equipment Utilized During Treatment Gait belt    Activity Tolerance Patient tolerated treatment well    Behavior During Therapy WFL for tasks assessed/performed              Past Medical History:  Diagnosis Date   History of stress test    a. 02/2013 Nl stress test.   Hypertension    Insomnia, unspecified    Mitral regurgitation    a. echo 01/2016: nl LV sys fxn, mild MR, w/o pulm htn, nl atrial size   Osteoporosis, unspecified    Paroxysmal atrial fibrillation ([place]) 01/2016   a. diagnosed 01/2016; b. has been on eliquis, pradaxa, and xarelto at varying times 2/2 cost of each medication-->currently on eliquis (11/2016)   Pure hypercholesterolemia    Sleep apnea    Unable to tolerate nocturnal PAP therapy   TIA (transient ischemic attack)    Unspecified asthma(493.90)    Vertigo    Past Surgical History:  Procedure Laterality Date   ABDOMINAL HYSTERECTOMY     CATARACT EXTRACTION Bilateral 08/31/2016   COLONOSCOPY WITH PROPOFOL N/A 03/04/2015   Procedure: COLONOSCOPY WITH PROPOFOL;  Surgeon: Lucilla Lame, MD;  Location: [facility] ENDOSCOPY;  Service: Endoscopy;  Laterality: N/A;   COLONOSCOPY WITH PROPOFOL N/A 04/01/2020   Procedure: COLONOSCOPY WITH PROPOFOL;  Surgeon: Lucilla Lame, MD;  Location: [facility] ENDOSCOPY;  Service: Endoscopy;  Laterality: N/A;   DIAGNOSTIC LAPAROSCOPY     REMOVAL OF SCAR TISSUE (ADHESIONS)   KNEE ARTHROPLASTY Right 08/14/2021   Procedure: COMPUTER ASSISTED TOTAL KNEE ARTHROPLASTY - RNFA;  Surgeon: Dereck Leep, MD;  Location: [facility] ORS;  Service: Orthopedics;  Laterality: Right;   KNEE ARTHROSCOPY Right 10/08/2020   Procedure: ARTHROSCOPY KNEE;  Surgeon: Dereck Leep, MD;  Location: [facility] ORS;  Service: Orthopedics;  Laterality: Right;   KNEE ARTHROSCOPY Right 10/21/2020   Procedure: Arthroscopic irrigation and debridement right knee;  Surgeon: Dereck Leep, MD;  Location: [facility] ORS;  Service: Orthopedics;  Laterality: Right;   Patient Active Problem List   Diagnosis Date Noted   Senile purpura ([place]) 05/24/2022   Benign paroxysmal positional vertigo 05/24/2022   Thrombocytosis 11/19/2021   Mixed hyperlipidemia 11/19/2021   Atherosclerosis of aorta ([place]) 11/19/2021   Mild protein-calorie malnutrition ([place]) 11/19/2021   OSA (obstructive sleep apnea) 11/19/2021   Pre-diabetes 11/19/2021   Lumbar stenosis with neurogenic claudication 11/19/2021   Vitamin D deficiency 11/19/2021   History of total right knee replacement 08/14/2021   Osteoarthritis of right knee 05/05/2020   History of colonic polyps    Polyp of transverse colon    Aneurysm ([place]) 02/08/2018   History of CVA (cerebrovascular accident) without residual deficits 01/24/2018   Aneurysm of ophthalmic artery 01/24/2018   Impingement syndrome of shoulder region 01/02/2018   History of cataract surgery, right 09/01/2016   Paroxysmal atrial fibrillation ([place]) 02/24/2016   Vertigo 02/24/2016   Low HDL (under 40) 02/17/2016   Hx of colonic polyps  Benign neoplasm of sigmoid colon    Mild intermittent asthma without complication 33/82/5053   Hyperglycemia 02/21/2015   Essential hypertension 02/20/2015   Inconclusive mammogram due to dense breasts 01/02/2015   Dense breast 01/02/2015   Abnormal EKG 02/23/2013    ONSET DATE: Recent onset 04/02/2022, however, has had vertigo on and off for 30+ years, neck/head/shoulder pain >6 months ago  REFERRING DIAG: occipital neuralgia  THERAPY DIAG:  Cervicalgia  Muscle weakness  (generalized)  Rationale for Evaluation and Treatment: Rehabilitation  SUBJECTIVE:                                                                                                                                                                                             SUBJECTIVE STATEMENT: Pt reports she has not had vertigo since last appointment. She reports no pain today. Pt reports pain has been OK in last week or two. She does still indicate feeling like she has a "knot" in her shoulder and can have pain in these regions. She uses heat/hot shower to help her shoulder pain on R side.   Pt accompanied by: self  PERTINENT HISTORY:   PT seen in ER 04/09/2022 due to dizziness and headaches with occ sharp pain in R temporal region, with nausea with initial symptom onset 04/02/2022 per chart. Pt diagnosed with occipital neuralgia, last seen by neuro 04/22/2022 where pt was still having dizziness and headaches. Per note Manuella Ghazi pt with stable 3 mm aneurysm. Does not feel necessary to repeat imaging at this time unless pt experiences symptoms. Work-up was negative for stroke. Per referral pt with hx of severe vertigo. Pt would like to address both vertigo and chronic pain.  Pt presents to PT eval for ongoing R side head/neck/shoulder pain (diagnosed with occipital neuralgia, per chart) and for chronic recurrent vertigo of 30+ years. Pt has had "maneuvers" performed in the past, one as recently 4 weeks ago. She reports this has helped her 1x, but otherwise has not resolved her symptoms. She would like to address both her chronic pain and vertigo.  She reports when she has a vertigo attack it makes her very sick, she throws up and has diarrhea during episodes. These episodes can last for weeks, and her vertigo can last for hours. The only thing that helps is laying down. She can wake up with vertigo, can have spontaneous onset without movement. Pt reports she does not have migraines, no changes in her  hearing or vision. Denies history of concussion or falls with hitting her head. She reports no ringing in ears or sense of fullness. She has not felt lightheaded. She  now reports general unsteadiness and has difficulty walking around in the dark. She reports no falls in last six months. Turning her head quickly, looking up to place something in her cabinets, and bending over makes her unsteady. To manage symptoms she takes Tylenol for her head pain. She does not currently take Meclizine, but used to. Has other medication to manage nausea.  She request to not perform any positional testing or treatment maneuvers today because she does not want to feel "sick for the holiday." PT discusses with pt contacting her physician about medication management so positional testing can be completed in future session. Pt agreeable to plan.  PMH: sleep apnea, R meniscus tear (2021), a fib, HTN, Stroke TIA, R knee arthroscopy, parital medial meniscectomy and chondroplasty (2022), R knee arthroscopy, arthroscopic irrigation and debridement 2022, R total knee arthrolasty 2023, benign neoplasm of sigmoid colon, aneurysm of ophthalmic artery, impingement syndrome of shoulder region, atherosclerosis of aorta, OSA, pre-diabetes, vitamin d deficiency  PAIN:  Are you having pain?  R shoulder soreness. Frequently has  R sided temple/head/neck and shoulder pain  PRECAUTIONS: Fall  WEIGHT BEARING RESTRICTIONS: No  FALLS: Has patient fallen in last 6 months? No  LIVING ENVIRONMENT: Lives with: lives alone Lives in: House/apartment Stairs: No Has following equipment at home: Single point cane, Environmental consultant - 2 wheeled, Wheelchair (manual), shower chair, and Grab bars  PLOF: Independent  PATIENT GOALS: I just want to get rid of this vertigo, improve balance  OBJECTIVE:   DIAGNOSTIC FINDINGS:   04/09/22 MR ANGIO HEAD WO CONTRAST "IMPRESSION: 1. No acute intracranial process. 2. No intracranial large vessel occlusion or  significant stenosis. 3. Redemonstrated 3 mm superiorly oriented left paraophthalmic ICA aneurysm."  04/09/22 MR BRAIN WO CONTRAST "IMPRESSION: 1. No acute intracranial process. 2. No intracranial large vessel occlusion or significant stenosis. 3. Redemonstrated 3 mm superiorly oriented left paraophthalmic ICA aneurysm."  04/09/2022: "CT HEAD WO CONTRAST IMPRESSION: No evidence of acute intracranial abnormality.   Redemonstrated chronic small-vessel infarct in the left basal ganglia.   Mild chronic small vessel image changes within the cerebral white matter."    COGNITION: Overall cognitive status: Within functional limits for tasks assessed   SENSATION: Per pt reports no N/T  COORDINATION: WNL  POSTURE: increased thoracic kyphosis  Cervical ROM: Lat Flex - R 35 deg *dizziness with R lat flex, L 30 deg Cervical Rotation - R 48 deg. L 45 deg Cerv Flex 25 deg and pain-limited Cerv Ext 35 deg and pain-limited    GYI:RSWNIOE 4/5 BLE  BED MOBILITY:  Occ limitation due to vertigo  STAIRS: Level of Assistance: CGA Stair Negotiation Technique: Step to Pattern descending with Bilateral Rails Number of Stairs: 4   GAIT: Gait pattern:  increased variability in BOS, has to use step-strategy to correct for unsteadiness. Distance walked: clinic distances, 10MWT Assistive device utilized: None Level of assistance: CGA Comments: see 10MWT for full details  Oculomotor testing: Vergence- sees double 6" away Smooth pursuits- smooth, however, pt with increased blinking and feels "I have to refocus" Saccades- Testing makes pt feel off, pt closes eyes throughout Ocular ROM- WNL Spontaneous nystagmus- none on exam End-gaze nystagmus- none on exam   Slow VOR - one very prominent corrective saccade observed, another possible small corrective saccade. No dizziness. Pt reports she felt it was difficult to keep focused on the target "it's like my eyes want to move."   FUNCTIONAL  TESTS:  10 meter walk test: 0.77 m/s, increased variability in BOS, has  to use step-strategy to correct for unsteadiness. Dynamic Gait Index: 18/24  PATIENT SURVEYS:  FOTO 64 (goal per FOTO 65) NDI: 38% DHI: 56, indicates moderate perception of handicap   Positional testing: Roll testing -negative B DH - negative for nystagmus, pt did report brief <1 sec feeling of "turning" immediately with laying back into position on B sides, but this resolved immediately  Pt reports feeling back to baseline after testing completed. Instructed in post-testing precautions   TODAY'S TREATMENT:                Manual- Heat donned to R and L shoulder throughout while PT provides manual therapy (below), with pt on plinth- Pt supine on plinth/bolster supporting BLE PT provides STM and TrP release to B UT, posterior shoulder musculature, B cervical paraspinals and muscles of occipital triangle. Primary focus on R side.  Additionally PT brought pt through prolonged cervical rotation and lateral flexion stretching while providing manual therapy. Pt reports interventions feels good.  Heat removed and skin checked and is WNL. No adverse reaction to heat.  TherEx- Chin tucks in supine 2x10 with 2 sec holds. Cuing throughout for technique.  Seated scapular squeezes 2x10  UE MMT: grossly 4 to 4+/5 in BUE, greater deficits on R side  RTB ER/pull-aparts 2x10                       PATIENT EDUCATION: Education details: Pt educated throughout session about proper posture and technique with exercises. Improved exercise technique, movement at target joints, use of target muscles after min to mod verbal, visual, tactile cues. PT recommends pt f/u with neurologist and possibly seek consult with audiologist if vertigo returns   Person educated: Patient Education method: Explanation, Demonstration, and Verbal cues Education comprehension: verbalized understanding, returned demonstration, and needs further  education  HOME EXERCISE PROGRAM: Pt to continue HEP as previously given Updated to add 11/27: Access Code: [email or web address] URL: [email or web address] Date: 05/24/2022 Prepared by: Ricard Dillon  Exercises - Seated Gaze Stabilization with Head Rotation  - 1 x daily - 7 x weekly - 3 sets - 2 reps - 30 seconds hold  Access Code: [email or web address] URL: [email or web address] Date: 05/13/2022 Prepared by: Ricard Dillon  Exercises - Seated Cervical Rotation Stretch  - 1 x daily - 7 x weekly - 2 sets - 2 reps - 30 sec hold  GOALS: Goals reviewed with patient? Yes  SHORT TERM GOALS: Target date: 06/24/2022  The pt will be independent with her HEP in order to decrease pain, and to improve balance, functional mobility, and ease and safety with her ADLs. Baseline: initiated Goal status: INITIAL  LONG TERM GOALS: Target date: 08/05/2022  The pt will improve FOTO score equal to or greater than  65  to demonstrate clinically significant improvement in QOL. Baseline: 64 Goal status: INITIAL  2.  The patient will increase dynamic gait index score to >19/24 to demonstrate reduced fall risk and improved dynamic gait balance for better safety with community/home ambulation Baseline: 18 Goal status: INITIAL  3.  The patient will reduce NDI score to <10% to demonstrate minimal disability with ADLs including improved sleeping tolerance, sitting tolerance, etc for better mobility at home.   Baseline: 38% Goal status: INITIAL  4.  The patient will reduce dizziness handicap inventory score to <40 for less dizziness with ADLs and increased safety with home and community tasks/activities.  Baseline: 56 Goal status: INITIAL    ASSESSMENT:  CLINICAL IMPRESSION: Initiated manual therapy  to B shoulders and cervical region. Pt with good response to intervention. She exhibits some weakness in BUE R>L. To address these PT instructed pt in B shoulder strengthening as well as strengthening of  deep neck flexors. The pt will benefit from further skilled PT to address these impairments in order to increase QOL, decrease pain, manage dizziness symptoms, and decrease fall risk.   OBJECTIVE IMPAIRMENTS: Abnormal gait, decreased balance, decreased coordination, decreased knowledge of use of DME, decreased mobility, difficulty walking, decreased ROM, dizziness, hypomobility, impaired flexibility, impaired vision/preception, improper body mechanics, postural dysfunction, and pain.   ACTIVITY LIMITATIONS: lifting, bending, squatting, stairs, bed mobility, and locomotion level  PARTICIPATION LIMITATIONS: meal prep, cleaning, laundry, driving, shopping, community activity, occupation, and yard work  PERSONAL FACTORS: Age, Fitness, Past/current experiences, Sex, Time since onset of injury/illness/exacerbation, and 3+ comorbidities: PMH: sleep apnea, R meniscus tear (2021), a fib, HTN, Stroke TIA, R knee arthroscopy, parital medial meniscectomy and chondroplasty (2022), R knee arthroscopy, arthroscopic irrigation and debridement 2022, R total knee arthrolasty 2023, benign neoplasm of sigmoid colon, aneurysm of ophthalmic artery, impingement syndrome of shoulder region, atherosclerosis of aorta, OSA, pre-diabetes, vitamin d deficiency  are also affecting patient's functional outcome.   REHAB POTENTIAL: Fair    CLINICAL DECISION MAKING: Evolving/moderate complexity  EVALUATION COMPLEXITY: Moderate  PLAN:  PT FREQUENCY: 2x/week  PT DURATION: 12 weeks  PLANNED INTERVENTIONS: Therapeutic exercises, Therapeutic activity, Neuromuscular re-education, Balance training, Gait training, Patient/Family education, Self Care, Joint mobilization, Stair training, Vestibular training, Canalith repositioning, Visual/preceptual remediation/compensation, Orthotic/Fit training, DME instructions, Dry Needling, Electrical stimulation, Wheelchair mobility training, Spinal mobilization, Cryotherapy, Moist heat, Taping,  Traction, Ultrasound, Manual therapy, and Re-evaluation  PLAN FOR NEXT SESSION: balance, manual therapy cervical spine, VOR   Zollie Pee, PT 06/03/2022, 4:46 PM

## 2022-06-07 ENCOUNTER — Ambulatory Visit: Payer: Medicare Other

## 2022-06-08 ENCOUNTER — Ambulatory Visit: Payer: Medicare Other

## 2022-06-10 ENCOUNTER — Ambulatory Visit: Payer: Medicare Other

## 2022-06-11 ENCOUNTER — Ambulatory Visit: Payer: Medicare Other | Attending: Medical | Admitting: Medical

## 2022-06-11 ENCOUNTER — Encounter: Payer: Self-pay | Admitting: Medical

## 2022-06-11 VITALS — BP 118/80 | HR 56 | Ht 62.0 in | Wt 171.0 lb

## 2022-06-11 DIAGNOSIS — I10 Essential (primary) hypertension: Secondary | ICD-10-CM

## 2022-06-11 DIAGNOSIS — E785 Hyperlipidemia, unspecified: Secondary | ICD-10-CM | POA: Diagnosis not present

## 2022-06-11 DIAGNOSIS — R42 Dizziness and giddiness: Secondary | ICD-10-CM | POA: Diagnosis not present

## 2022-06-11 DIAGNOSIS — I480 Paroxysmal atrial fibrillation: Secondary | ICD-10-CM | POA: Diagnosis not present

## 2022-06-11 NOTE — Progress Notes (Signed)
Cardiology Office Note:    Date:  06/12/2022   ID:  [name], DOB 07-May-1941, MRN [id number]  PCP:  Steele Sizer, MD  [facility] Cardiologist:  None  [facility] Electrophysiologist:  None   Referring MD: Steele Sizer, MD   Chief Complaint: paroxysmal afib  History of Present Illness:    [name] is a 81 y.o. female with a hx of paroxysmal afib, TIA, vertigo, small left thalamic artery aneurysm, HTN, HLD, insomnia, OSA intolerant to CPAP who presents for follow-up.    She was hospitalized in July 2019 with spasms involving her left hand. MRI of the brain showed no evidence of acute stroke. There was an incidental finding of small ophthalmic artery aneurysm. She had no eye symptoms. Echo showed normal LVSF and no cardiac source of emboli. Carotid doppler showed mild nonobstructive disease.   She had a Lexiscan Myoview in July 2020 that was normal. It was done for atypical chest pain. Echo in July 2020 showed an EF 55-60%, mild biatrial enlargement and trivial MR.  Last seen 02/19/22 and was doing well from a cardiac standpoint.   Today, the patient reports knee replacement since the last visit. She also has vertigo, but reports it is better.  She went to PT and this helped. She has rare heart racing. No chest pain, shortness of breath, lower leg edema, orthopnea or pnd.   Past Medical History:  Diagnosis Date   History of stress test    a. 02/2013 Nl stress test.   Hypertension    Insomnia, unspecified    Mitral regurgitation    a. echo 01/2016: nl LV sys fxn, mild MR, w/o pulm htn, nl atrial size   Osteoporosis, unspecified    Paroxysmal atrial fibrillation ([place]) 01/2016   a. diagnosed 01/2016; b. has been on eliquis, pradaxa, and xarelto at varying times 2/2 cost of each medication-->currently on eliquis (11/2016)   Pure hypercholesterolemia    Sleep apnea    Unable to tolerate nocturnal PAP therapy   TIA (transient ischemic attack)    Unspecified  asthma(493.90)    Vertigo     Past Surgical History:  Procedure Laterality Date   ABDOMINAL HYSTERECTOMY     CATARACT EXTRACTION Bilateral 08/31/2016   COLONOSCOPY WITH PROPOFOL N/A 03/04/2015   Procedure: COLONOSCOPY WITH PROPOFOL;  Surgeon: Lucilla Lame, MD;  Location: [facility] ENDOSCOPY;  Service: Endoscopy;  Laterality: N/A;   COLONOSCOPY WITH PROPOFOL N/A 04/01/2020   Procedure: COLONOSCOPY WITH PROPOFOL;  Surgeon: Lucilla Lame, MD;  Location: [facility] ENDOSCOPY;  Service: Endoscopy;  Laterality: N/A;   DIAGNOSTIC LAPAROSCOPY     REMOVAL OF SCAR TISSUE (ADHESIONS)   KNEE ARTHROPLASTY Right 08/14/2021   Procedure: COMPUTER ASSISTED TOTAL KNEE ARTHROPLASTY - RNFA;  Surgeon: Dereck Leep, MD;  Location: [facility] ORS;  Service: Orthopedics;  Laterality: Right;   KNEE ARTHROSCOPY Right 10/08/2020   Procedure: ARTHROSCOPY KNEE;  Surgeon: Dereck Leep, MD;  Location: [facility] ORS;  Service: Orthopedics;  Laterality: Right;   KNEE ARTHROSCOPY Right 10/21/2020   Procedure: Arthroscopic irrigation and debridement right knee;  Surgeon: Dereck Leep, MD;  Location: [facility] ORS;  Service: Orthopedics;  Laterality: Right;    Current Medications: Current Meds  Medication Sig   acetaminophen (TYLENOL) 650 MG CR tablet Take 650 mg by mouth every 8 (eight) hours as needed for pain.   albuterol (VENTOLIN HFA) 108 (90 Base) MCG/ACT inhaler INHALE 2 PUFFS INTO THE LUNGS EVERY 6 HOURS AS NEEDED FOR WHEEZING OR SHORTNESS  OF BREATH   amLODipine (NORVASC) 2.5 MG tablet TAKE 2 TABLETS BY MOUTH IN THE MORNING AND 1 TABLET IN THE EVENING, ONLY IF BLOOD PRESSURE IS ABOVE 120/80   apixaban (ELIQUIS) 5 MG TABS tablet TAKE 1 TABLET(5 MG) BY MOUTH TWICE DAILY   Calcium-Magnesium-Vitamin D (CALCIUM 1200+D3 PO) Take 1 tablet by mouth 2 (two) times a week. 1200 mg calcium / 1000 units Vitamin D3   Cholecalciferol (VITAMIN D) 50 MCG (2000 UT) CAPS Take 1 capsule by mouth daily at 12 noon.   cloNIDine (CATAPRES) 0.1 MG tablet as  needed.   diltiazem (CARDIZEM) 30 MG tablet Take 1 tablet (30 mg total) by mouth every 6 (six) hours as needed (for tachycardia/recurrent Afib.).   promethazine (PHENERGAN) 12.5 MG tablet Take 0.5 tablets (6.25 mg total) by mouth every 6 (six) hours as needed for up to 5 days for nausea or vomiting.   rosuvastatin (CRESTOR) 10 MG tablet Take 1 tablet (10 mg total) by mouth at bedtime.     Allergies:   Ace inhibitors and Penicillins   Social History   Socioeconomic History   Marital status: Single    Spouse name: Not on file   Number of children: 2   Years of education: Not on file   Highest education level: Associate degree: academic program  Occupational History   Occupation: Retired  Tobacco Use   Smoking status: Never   Smokeless tobacco: Never   Tobacco comments:    smoking cessation materials not required  Vaping Use   Vaping Use: Never used  Substance and Sexual Activity   Alcohol use: No    Alcohol/week: 0.0 standard drinks of alcohol   Drug use: No   Sexual activity: Not Currently    Partners: Male  Other Topics Concern   Not on file  Social History Narrative   Independent at baseline. Lives alone.    Social Determinants of Health   Financial Resource Strain: Low Risk  (02/04/2022)   Overall Financial Resource Strain (CARDIA)    Difficulty of Paying Living Expenses: Not very hard  Food Insecurity: No Food Insecurity (02/04/2022)   Hunger Vital Sign    Worried About Running Out of Food in the Last Year: Never true    Ran Out of Food in the Last Year: Never true  Transportation Needs: No Transportation Needs (02/04/2022)   PRAPARE - Hydrologist (Medical): No    Lack of Transportation (Non-Medical): No  Physical Activity: Insufficiently Active (02/04/2022)   Exercise Vital Sign    Days of Exercise per Week: 1 day    Minutes of Exercise per Session: 20 min  Stress: No Stress Concern Present (02/04/2022)   [place]    Feeling of Stress : Only a little  Social Connections: Moderately Integrated (02/04/2022)   Social Connection and Isolation Panel [NHANES]    Frequency of Communication with Friends and Family: More than three times a week    Frequency of Social Gatherings with Friends and Family: Twice a week    Attends Religious Services: More than 4 times per year    Active Member of [employer] or Organizations: Yes    Attends Archivist Meetings: Never    Marital Status: Divorced     Family History: The patient's family history includes Aneurysm in her mother; COPD in her father; Cancer in her brother; Healthy in her brother; Hypertension in her brother and mother; Stroke in  her brother.  ROS:   Please see the history of present illness.     All other systems reviewed and are negative.  EKGs/Labs/Other Studies Reviewed:    The following studies were reviewed today:  Echo 12/2018  1. The left ventricle has normal systolic function, with an ejection  fraction of 55-60%. The cavity size was normal. There is mildly increased  left ventricular wall thickness. Left ventricular diastolic Doppler  parameters are consistent with impaired  relaxation. No evidence of left ventricular regional wall motion  abnormalities.   2. The right ventricle has normal systolic function. The cavity was  normal. There is no increase in right ventricular wall thickness. Right  ventricular systolic pressure could not be assessed.   3. Left atrial size was mildly dilated.   4. Right atrial size was mildly dilated.   5. The aortic valve is tricuspid. Mild aortic annular calcification  noted.   6. The mitral valve is grossly normal.   7. The aorta is normal in size and structure.   8. The interatrial septum was not well visualized.   Myoview Lexiscan 12/2018 Narrative & Impression  Pharmacological myocardial perfusion imaging study with no significant  ischemia Normal wall motion, EF estimated at 45% GI uptake artifact No EKG changes concerning for ischemia at peak stress or in recovery. Low risk scan    Echo 12/2017 Study Conclusions   - Left ventricle: The cavity size was normal. There was mild    concentric hypertrophy. Systolic function was normal. The    estimated ejection fraction was in the range of 60% to 65%. Wall    motion was normal; there were no regional wall motion    abnormalities. Doppler parameters are consistent with abnormal    left ventricular relaxation (grade 1 diastolic dysfunction).  - Right ventricle: Systolic function was normal.  - Atrial septum: No defect or patent foramen ovale was identified.    Echo contrast study showed no right-to-left atrial level shunt,    at baseline or with provocation.  - Pulmonary arteries: Systolic pressure was within the normal    range.     EKG:  EKG is ordered today.  This showed Sinus bradycardia, 56bpm, LAD, TWI III and AVF  Recent Labs: 11/19/2021: Magnesium 2.1 04/09/2022: ALT 19; BUN 15; Creatinine, Ser 0.89; Hemoglobin 15.2; Platelets 365; Potassium 3.6; Sodium 139  Recent Lipid Panel    Component Value Date/Time   CHOL 111 05/24/2022 1127   CHOL 98 (L) 01/09/2019 1023   TRIG 66 05/24/2022 1127   HDL 50 05/24/2022 1127   HDL 39 (L) 01/09/2019 1023   CHOLHDL 2.2 05/24/2022 1127   VLDL 7 01/22/2018 1154   LDLCALC 46 05/24/2022 1127   LDLDIRECT 46 01/09/2019 1023    Physical Exam:    VS:  BP 118/80 (BP Location: Left Arm, Patient Position: Sitting, Cuff Size: Large)   Pulse (!) 56   Ht '5\' 2"'$  (1.575 m)   Wt 171 lb (77.6 kg)   SpO2 98%   BMI 31.28 kg/m     Wt Readings from Last 3 Encounters:  06/11/22 171 lb (77.6 kg)  05/24/22 170 lb 3.2 oz (77.2 kg)  04/16/22 166 lb (75.3 kg)     GEN:  Well nourished, well developed in no acute distress HEENT: Normal NECK: No JVD; No carotid bruits LYMPHATICS: No lymphadenopathy CARDIAC: RRR, no murmurs,  rubs, gallops RESPIRATORY:  Clear to auscultation without rales, wheezing or rhonchi  ABDOMEN: Soft, non-tender, non-distended MUSCULOSKELETAL:  No edema; No deformity  SKIN: Warm and dry NEUROLOGIC:  Alert and oriented x 3 PSYCHIATRIC:  Normal affect   ASSESSMENT:    1. PAF (paroxysmal atrial fibrillation) ([place])   2. Essential hypertension   3. Hyperlipidemia LDL goal <70   4. Vertigo    PLAN:    In order of problems listed above:  Paroxysmal Afib EKG shows sinus bradycardia. She is on Eliquis '5mg'$  BID, we will give her samples as she is in the doughnut whole. She is not on rate controlling medication at baseline.   HTN BP is good today, continue Amlodipine and Clonidine.   Vertigo Vertigo has improved.   HLD LDL 46. Continue Crestor.   Disposition: Follow up in 1 year(s) with MD/APP    Signed, Starlynn Klinkner Ninfa Meeker, PA-C  06/12/2022 3:31 PM    [facility]

## 2022-06-11 NOTE — Patient Instructions (Addendum)
Medication Instructions:   Your physician recommends that you continue on your current medications as directed. Please refer to the Current Medication list given to you today.  *If you need a refill on your cardiac medications before your next appointment, please call your pharmacy*   Lab Work:  NONE  If you have labs (blood work) drawn today and your tests are completely normal, you will receive your results only by: [place] (if you have MyChart) OR A paper copy in the mail If you have any lab test that is abnormal or we need to change your treatment, we will call you to review the results.   Testing/Procedures:  NONE   Follow-Up: At [facility], you and your health needs are our priority.  As part of our continuing mission to provide you with exceptional heart care, we have created designated Provider Care Teams.  These Care Teams include your primary Cardiologist (physician) and Advanced Practice Providers (APPs -  Physician Assistants and Nurse Practitioners) who all work together to provide you with the care you need, when you need it.  We recommend signing up for the patient portal called "MyChart".  Sign up information is provided on this After Visit Summary.  MyChart is used to connect with patients for Virtual Visits (Telemedicine).  Patients are able to view lab/test results, encounter notes, upcoming appointments, etc.  Non-urgent messages can be sent to your provider as well.   To learn more about what you can do with MyChart, go to [email or web address].    Your next appointment:   12 month(s)  The format for your next appointment:   In Person  Provider:   Kathlyn Sacramento, MD     *ELIQUIS 5 mg SAMPLES   #2 LOT# [id number] EXP: 10/2023   Important Information About Sugar

## 2022-06-14 ENCOUNTER — Telehealth: Payer: Self-pay

## 2022-06-14 NOTE — Telephone Encounter (Signed)
Care Management   Visit Note  06/14/2022 Name: [name] MRN: [id number] DOB: 03/17/1941  Subjective: [name] is a 81 y.o. year old female who is a primary care patient of Alba Cory, MD. The Care Management team was consulted for assistance.      Engaged with patient spoke with patient by telephone.   Assessment:  Outpatient Encounter Medications as of 06/14/2022  Medication Sig   acetaminophen (TYLENOL) 650 MG CR tablet Take 650 mg by mouth every 8 (eight) hours as needed for pain.   Calcium-Magnesium-Vitamin D (CALCIUM 1200+D3 PO) Take 1 tablet by mouth 2 (two) times a week. 1200 mg calcium / 1000 units Vitamin D3   Cholecalciferol (VITAMIN D) 50 MCG (2000 UT) CAPS Take 1 capsule by mouth daily at 12 noon.   cloNIDine (CATAPRES) 0.1 MG tablet as needed.   diltiazem (CARDIZEM) 30 MG tablet Take 1 tablet (30 mg total) by mouth every 6 (six) hours as needed (for tachycardia/recurrent Afib.).   promethazine (PHENERGAN) 12.5 MG tablet Take 0.5 tablets (6.25 mg total) by mouth every 6 (six) hours as needed for up to 5 days for nausea or vomiting.   [DISCONTINUED] albuterol (VENTOLIN HFA) 108 (90 Base) MCG/ACT inhaler INHALE 2 PUFFS INTO THE LUNGS EVERY 6 HOURS AS NEEDED FOR WHEEZING OR SHORTNESS OF BREATH   [DISCONTINUED] amLODipine (NORVASC) 2.5 MG tablet TAKE 2 TABLETS BY MOUTH IN THE MORNING AND 1 TABLET IN THE EVENING, ONLY IF BLOOD PRESSURE IS ABOVE 120/80   [DISCONTINUED] apixaban (ELIQUIS) 5 MG TABS tablet TAKE 1 TABLET(5 MG) BY MOUTH TWICE DAILY   [DISCONTINUED] rosuvastatin (CRESTOR) 10 MG tablet Take 1 tablet (10 mg total) by mouth at bedtime.   No facility-administered encounter medications on file as of 06/14/2022.     Interventions: Reviewed plan for Ortho treatment and interventions. Currently completing outpatient Physical therapy. Reports therapy has been helpful Report completing recommended activities at home.  Reports mobility in the home has  improved. Denies recent falls Advised to keep all scheduled appointments and notify the care team if transportation assistance is required.  Reviewed plan for hypertension management. Reports taking BP a few times a week. Reports all readings have been within range.  Reviewed symptoms. Denies chest pain, palpitations, headaches or visual changes. Reports completing outreach with Cardiology/Dr. Kirke Corin last week. Will follow up in one year. Saw Dr. Kirke Corin within the last week. Will follow up in one year  PLAN Will follow up as needed  [facility]/Care Management [phone]

## 2022-06-15 ENCOUNTER — Ambulatory Visit: Payer: Medicare Other

## 2022-06-16 ENCOUNTER — Ambulatory Visit: Payer: Medicare Other

## 2022-06-17 ENCOUNTER — Ambulatory Visit: Payer: Medicare Other

## 2022-06-22 ENCOUNTER — Ambulatory Visit: Payer: Medicare Other

## 2022-06-24 ENCOUNTER — Ambulatory Visit: Payer: Medicare Other

## 2022-07-01 ENCOUNTER — Ambulatory Visit: Payer: Medicare Other

## 2022-07-05 ENCOUNTER — Ambulatory Visit: Payer: Medicare Other

## 2022-07-06 ENCOUNTER — Other Ambulatory Visit: Payer: Self-pay | Admitting: Cardiovascular Disease

## 2022-07-06 DIAGNOSIS — I480 Paroxysmal atrial fibrillation: Secondary | ICD-10-CM

## 2022-07-06 NOTE — Telephone Encounter (Signed)
Eliquis '5mg'$  refill request received. Patient is 82 years old, weight-77.6kg, Crea-0.89 on 04/09/2022, Diagnosis-Afib, and last seen by Cadence Furth on 06/11/2022. Dose is appropriate based on dosing criteria. Will send in refill to requested pharmacy.

## 2022-07-06 NOTE — Telephone Encounter (Signed)
Please review

## 2022-07-15 ENCOUNTER — Ambulatory Visit: Payer: Medicare Other

## 2022-07-20 ENCOUNTER — Ambulatory Visit: Payer: Medicare Other

## 2022-07-22 ENCOUNTER — Ambulatory Visit: Payer: Medicare Other

## 2022-07-26 ENCOUNTER — Ambulatory Visit: Payer: Medicare Other

## 2022-07-28 DIAGNOSIS — H35033 Hypertensive retinopathy, bilateral: Secondary | ICD-10-CM | POA: Diagnosis not present

## 2022-07-29 ENCOUNTER — Ambulatory Visit: Payer: Medicare Other

## 2022-07-30 ENCOUNTER — Telehealth: Payer: Self-pay | Admitting: Family Medicine

## 2022-07-30 NOTE — Telephone Encounter (Signed)
Patient notified

## 2022-07-30 NOTE — Telephone Encounter (Signed)
Copied from [place] [id number]. Topic: General - Inquiry >> Jul 30, 2022  3:39 PM Rosanne Ashing P wrote: Reason for CRM: pt called saying she has a dentist appt on the 5th at 9:00 and she is supposed to take the antibiotic because of her knee surgery and she does not have any.  She talked to [facility] last week asking about this and thought she was going to ask Dr. Ancil Boozer..  [facility] uses [facility] [place].  CB#  [phone]

## 2022-08-02 ENCOUNTER — Ambulatory Visit: Payer: Medicare Other

## 2022-08-03 DIAGNOSIS — M1712 Unilateral primary osteoarthritis, left knee: Secondary | ICD-10-CM | POA: Diagnosis not present

## 2022-08-03 DIAGNOSIS — M7651 Patellar tendinitis, right knee: Secondary | ICD-10-CM | POA: Diagnosis not present

## 2022-08-06 DIAGNOSIS — I671 Cerebral aneurysm, nonruptured: Secondary | ICD-10-CM | POA: Diagnosis not present

## 2022-08-06 DIAGNOSIS — H8110 Benign paroxysmal vertigo, unspecified ear: Secondary | ICD-10-CM | POA: Diagnosis not present

## 2022-08-06 DIAGNOSIS — M542 Cervicalgia: Secondary | ICD-10-CM | POA: Diagnosis not present

## 2022-08-06 DIAGNOSIS — M5481 Occipital neuralgia: Secondary | ICD-10-CM | POA: Diagnosis not present

## 2022-08-06 DIAGNOSIS — Z8673 Personal history of transient ischemic attack (TIA), and cerebral infarction without residual deficits: Secondary | ICD-10-CM | POA: Diagnosis not present

## 2022-08-06 DIAGNOSIS — G4733 Obstructive sleep apnea (adult) (pediatric): Secondary | ICD-10-CM | POA: Diagnosis not present

## 2022-08-09 ENCOUNTER — Ambulatory Visit: Payer: Medicare Other

## 2022-08-11 ENCOUNTER — Ambulatory Visit: Payer: Medicare Other

## 2022-08-16 ENCOUNTER — Ambulatory Visit: Payer: Medicare Other

## 2022-08-16 ENCOUNTER — Other Ambulatory Visit: Payer: Self-pay | Admitting: Family Medicine

## 2022-08-16 DIAGNOSIS — E782 Mixed hyperlipidemia: Secondary | ICD-10-CM

## 2022-08-16 DIAGNOSIS — Z8673 Personal history of transient ischemic attack (TIA), and cerebral infarction without residual deficits: Secondary | ICD-10-CM

## 2022-08-16 DIAGNOSIS — M1712 Unilateral primary osteoarthritis, left knee: Secondary | ICD-10-CM | POA: Diagnosis not present

## 2022-08-19 ENCOUNTER — Ambulatory Visit: Payer: Medicare Other

## 2022-08-23 ENCOUNTER — Ambulatory Visit: Payer: Medicare Other

## 2022-08-23 DIAGNOSIS — M1712 Unilateral primary osteoarthritis, left knee: Secondary | ICD-10-CM | POA: Diagnosis not present

## 2022-08-26 ENCOUNTER — Ambulatory Visit: Payer: Medicare Other

## 2022-08-30 ENCOUNTER — Ambulatory Visit: Payer: Medicare Other

## 2022-08-30 DIAGNOSIS — M1712 Unilateral primary osteoarthritis, left knee: Secondary | ICD-10-CM | POA: Diagnosis not present

## 2022-09-02 ENCOUNTER — Ambulatory Visit: Payer: Medicare Other

## 2022-09-06 ENCOUNTER — Ambulatory Visit: Payer: Medicare Other

## 2022-09-09 ENCOUNTER — Ambulatory Visit: Payer: Medicare Other

## 2022-09-13 ENCOUNTER — Ambulatory Visit: Payer: Medicare Other

## 2022-09-14 ENCOUNTER — Ambulatory Visit (INDEPENDENT_AMBULATORY_CARE_PROVIDER_SITE_OTHER): Payer: Medicare Other

## 2022-09-14 ENCOUNTER — Ambulatory Visit: Payer: Medicare Other | Admitting: Podiatry

## 2022-09-14 ENCOUNTER — Encounter: Payer: Self-pay | Admitting: Podiatry

## 2022-09-14 DIAGNOSIS — M722 Plantar fascial fibromatosis: Secondary | ICD-10-CM

## 2022-09-14 DIAGNOSIS — R52 Pain, unspecified: Secondary | ICD-10-CM | POA: Diagnosis not present

## 2022-09-14 MED ORDER — METHYLPREDNISOLONE 4 MG PO TBPK: ORAL_TABLET | ORAL | 0 refills | Status: DC

## 2022-09-14 MED ORDER — BETAMETHASONE SOD PHOS & ACET 6 (3-3) MG/ML IJ SUSP
3.0000 mg | Freq: Once | INTRAMUSCULAR | Status: AC
  Administered 2022-09-14: 3 mg via INTRA_ARTICULAR

## 2022-09-14 NOTE — Progress Notes (Signed)
   Chief Complaint  Patient presents with   Foot Pain    "I have this Plantar Fasciitis on my left foot." N - heel pain L - left D - 5-6 weeks O - gradually worse C - sore A - walking T - rubbed it with cream, rolled on a bottle, exercises    Subjective: 82 y.o. female presenting today for new complaint of of pain and tenderness to the left plantar heel that is been ongoing for about 5-6 weeks now.  Gradual idiopathic onset.  Denies a history of injury or change in activity or shoe gear.  She has tried stretching her foot, applying topical creams, and massage with a frozen water bottle.   Past Medical History:  Diagnosis Date   History of stress test    a. 02/2013 Nl stress test.   Hypertension    Insomnia, unspecified    Mitral regurgitation    a. echo 01/2016: nl LV sys fxn, mild MR, w/o pulm htn, nl atrial size   Osteoporosis, unspecified    Paroxysmal atrial fibrillation ([place]) 01/2016   a. diagnosed 01/2016; b. has been on eliquis, pradaxa, and xarelto at varying times 2/2 cost of each medication-->currently on eliquis (11/2016)   Pure hypercholesterolemia    Sleep apnea    Unable to tolerate nocturnal PAP therapy   TIA (transient ischemic attack)    Unspecified asthma(493.90)    Vertigo      Objective: Physical Exam General: The patient is alert and oriented x3 in no acute distress.  Dermatology: Skin is warm, dry and supple bilateral lower extremities. Negative for open lesions or macerations bilateral.   Vascular: Dorsalis Pedis and Posterior Tibial pulses palpable bilateral.  Capillary fill time is immediate to all digits.  Neurological: Grossly intact via light touch  Musculoskeletal: Tenderness to palpation to the plantar aspect of the left heel along the plantar fascia. All other joints range of motion within normal limits bilateral. Strength 5/5 in all groups bilateral.   Radiographic exam LT foot 09/14/2022: Normal osseous mineralization. Joint spaces  preserved. No fracture/dislocation/boney destruction. No other soft tissue abnormalities or radiopaque foreign bodies.   Assessment: 1. Plantar fasciitis left foot  Plan of Care:  1. Patient evaluated. Xrays reviewed.   2. Injection of 0.5cc Celestone soluspan injected into the left plantar fascia.  3. Rx for Medrol Dose Pak placed 4.  Patient is on anticoagulant medication.  No NSAIDs.  Recommend OTC Tylenol as needed 5. Plantar fascial band(s) dispensed  6. Instructed patient regarding therapies and modalities at home to alleviate symptoms.  7. Return to clinic in 4 weeks.     Edrick Kins, DPM [facility]  Dr. Edrick Kins, DPM    [address], [place] [address]                Office [phone]  Fax [phone]

## 2022-09-15 ENCOUNTER — Other Ambulatory Visit: Payer: Self-pay | Admitting: Family Medicine

## 2022-09-15 DIAGNOSIS — I10 Essential (primary) hypertension: Secondary | ICD-10-CM

## 2022-09-16 ENCOUNTER — Ambulatory Visit: Payer: Medicare Other

## 2022-09-17 ENCOUNTER — Telehealth: Payer: Self-pay

## 2022-09-17 NOTE — Telephone Encounter (Unsigned)
    RN Visit Note   @DATE @ Name: [name] MRN: [id number]      DOB: 1940/07/14  Subjective: [name] is a 82 y.o. year old female who is a primary care patient of @PCP . The patient was referred to the Chronic Care Management team for assistance with care management needs subsequent to provider initiation of CCM services and plan of care.      {CCM OUTREACH ATTEMPTS:22247}  Plan:{CM FOLLOW UP HR:[id number]  SIG***

## 2022-09-21 NOTE — Progress Notes (Signed)
Name: [name]   MRN: [id number]    DOB: [birth year]   Date:09/22/2022       Progress Note  Subjective  Chief Complaint  Follow Up  HPI  HTN: bp is at goal no chest pain , seldom has palpitation but no SOB BP better since taking norvasc to 5 mg in am and 2.5 mg in pm , doing well on current regiment She has clonidine on her active medication list but she is not sure if taking it at this time  Plantar fascitis on left side: going to [facility] and had a steroid injection last week, still has pain   Pre-diabetes: last dropped from 6.1 % to 5.7 %  Denies polyphagia, polydipsia or polyuria .Stable   Paroxysmal Afib: sees Dr. Fletcher Anon, symptoms started August 2017, she is on Eliquis daily  and prn Cardizem, she states she uses less than once a month and is doing well     History of CVA/TIA and also aneursym of left para ophthalmic artery , under the care of Dr. Manuella Ghazi - neurologist.  She is now taking Crestor and denies side effects.. She has occasional right frontal headache and soreness on right side of neck and per Dr. Trena Platt note states occipital neuralgia but is doing better now She states symptoms are mild    MR angio head without contrast from 01/23/2020 compared with previous one done  09/08/2018:   IMPRESSION: Stable appearance of 3 mm left paraophthalmic ICA aneurysm.   No new aneurysm or high-grade narrowing.   Asthma: she states symptoms are controlled a this time, she denies cough, wheezing or SOB. Only taking albuterol prn and at less than once a month  OSA: seen by Dr. Manuella Ghazi - neurologist , unable to tolerate CPAP, sleep study done 06/30/2018 , she was supposed to have an oral appliance but not covered by insurance    Vertigo: she has vertigo with head movement, motion sickness, and when she has an episode she needs to go to [facility] by EMS because of severe nausea, vomiting and diarrhea 06/2016. She had Summer of 2020 but resolved by itself . Last flare was Oct 2023. She  states episodes are now about twice a year   Bilateral knee pain: she had right knee replacement Feb 2023, completed PT, not ready to have surgery on the left knee yet . She is now seeing Ortho and had synvisc injections on left knee and seems to be helping   Low back pain with radiculitis to left side: had to go to [facility] end of 2022, saw Dr. Phyllis Ginger and had two epidural injections, pain on left resolved, she states doing well now occasionally takes Tylenol for pain and is doing well   Atherosclerosis of aorta: found on CT abdomen done 10/22, continue eliquis and statin therapy  Senile purpura: on left arm today , stable and reassurance given   Patient Active Problem List   Diagnosis Date Noted   Senile purpura ([place]) 05/24/2022   Benign paroxysmal positional vertigo 05/24/2022   Thrombocytosis 11/19/2021   Mixed hyperlipidemia 11/19/2021   Atherosclerosis of aorta ([place]) 11/19/2021   Mild protein-calorie malnutrition ([place]) 11/19/2021   OSA (obstructive sleep apnea) 11/19/2021   Pre-diabetes 11/19/2021   Lumbar stenosis with neurogenic claudication 11/19/2021   Vitamin D deficiency 11/19/2021   History of total right knee replacement 08/14/2021   Osteoarthritis of right knee 05/05/2020   History of colonic polyps    Polyp of transverse colon  Aneurysm ([place]) 02/08/2018   History of CVA (cerebrovascular accident) without residual deficits 01/24/2018   Aneurysm of ophthalmic artery 01/24/2018   Impingement syndrome of shoulder region 01/02/2018   History of cataract surgery, right 09/01/2016   Paroxysmal atrial fibrillation ([place]) 02/24/2016   Vertigo 02/24/2016   Low HDL (under 40) 02/17/2016   Hx of colonic polyps    Benign neoplasm of sigmoid colon    Mild intermittent asthma without complication [id number]   Hyperglycemia 02/21/2015   Essential hypertension 02/20/2015   Inconclusive mammogram due to dense breasts 01/02/2015   Dense breast 01/02/2015   Abnormal EKG 02/23/2013     Past Surgical History:  Procedure Laterality Date   ABDOMINAL HYSTERECTOMY     CATARACT EXTRACTION Bilateral 08/31/2016   COLONOSCOPY WITH PROPOFOL N/A 03/04/2015   Procedure: COLONOSCOPY WITH PROPOFOL;  Surgeon: Lucilla Lame, MD;  Location: [facility] ENDOSCOPY;  Service: Endoscopy;  Laterality: N/A;   COLONOSCOPY WITH PROPOFOL N/A 04/01/2020   Procedure: COLONOSCOPY WITH PROPOFOL;  Surgeon: Lucilla Lame, MD;  Location: [facility] ENDOSCOPY;  Service: Endoscopy;  Laterality: N/A;   DIAGNOSTIC LAPAROSCOPY     REMOVAL OF SCAR TISSUE (ADHESIONS)   KNEE ARTHROPLASTY Right 08/14/2021   Procedure: COMPUTER ASSISTED TOTAL KNEE ARTHROPLASTY - RNFA;  Surgeon: Dereck Leep, MD;  Location: [facility] ORS;  Service: Orthopedics;  Laterality: Right;   KNEE ARTHROSCOPY Right 10/08/2020   Procedure: ARTHROSCOPY KNEE;  Surgeon: Dereck Leep, MD;  Location: [facility] ORS;  Service: Orthopedics;  Laterality: Right;   KNEE ARTHROSCOPY Right 10/21/2020   Procedure: Arthroscopic irrigation and debridement right knee;  Surgeon: Dereck Leep, MD;  Location: [facility] ORS;  Service: Orthopedics;  Laterality: Right;    Family History  Problem Relation Age of Onset   COPD Father    Hypertension Mother    Aneurysm Mother    Hypertension Brother    Stroke Brother    Cancer Brother        pancreatic   Healthy Brother     Social History   Tobacco Use   Smoking status: Never   Smokeless tobacco: Never   Tobacco comments:    smoking cessation materials not required  Substance Use Topics   Alcohol use: No    Alcohol/week: 0.0 standard drinks of alcohol     Current Outpatient Medications:    acetaminophen (TYLENOL) 650 MG CR tablet, Take 650 mg by mouth every 8 (eight) hours as needed for pain., Disp: , Rfl:    albuterol (VENTOLIN HFA) 108 (90 Base) MCG/ACT inhaler, INHALE 2 PUFFS INTO THE LUNGS EVERY 6 HOURS AS NEEDED FOR WHEEZING OR SHORTNESS OF BREATH, Disp: 6.7 g, Rfl: 0   amLODipine (NORVASC) 2.5 MG tablet, TAKE  2 TABLETS BY MOUTH IN THE MORNING AND 1 TABLET IN THE EVENING, ONLY IF BLOOD PRESSURE IS ABOVE 120/80, Disp: 270 tablet, Rfl: 0   apixaban (ELIQUIS) 5 MG TABS tablet, TAKE 1 TABLET(5 MG) BY MOUTH TWICE DAILY, Disp: 60 tablet, Rfl: 5   Calcium-Magnesium-Vitamin D (CALCIUM 1200+D3 PO), Take 1 tablet by mouth 2 (two) times a week. 1200 mg calcium / 1000 units Vitamin D3, Disp: , Rfl:    Cholecalciferol (VITAMIN D) 50 MCG (2000 UT) CAPS, Take 1 capsule by mouth daily at 12 noon., Disp: , Rfl:    cloNIDine (CATAPRES) 0.1 MG tablet, as needed., Disp: , Rfl:    diltiazem (CARDIZEM) 30 MG tablet, Take 1 tablet (30 mg total) by mouth every 6 (six) hours as needed (for tachycardia/recurrent Afib.)., Disp:  30 tablet, Rfl: 3   promethazine (PHENERGAN) 12.5 MG tablet, Take 0.5 tablets (6.25 mg total) by mouth every 6 (six) hours as needed for up to 5 days for nausea or vomiting., Disp: 10 tablet, Rfl: 0   rosuvastatin (CRESTOR) 10 MG tablet, TAKE 1 TABLET(10 MG) BY MOUTH AT BEDTIME, Disp: 90 tablet, Rfl: 0  Allergies  Allergen Reactions   Ace Inhibitors Swelling    Patient states her tongue swell.   Penicillins Anxiety    IgE = 251 (WNL) on 07/31/2021  Has patient had a PCN reaction causing immediate rash, facial/tongue/throat swelling, SOB or lightheadedness with hypotension: No Has patient had a PCN reaction causing severe rash involving mucus membranes or skin necrosis: No Has patient had a PCN reaction that required hospitalization No Has patient had a PCN reaction occurring within the last 10 years: No If all of the above answers are "NO", then may proceed with Cephalosporin use.    I personally reviewed active problem list, medication list, allergies, family history, social history, health maintenance with the patient/caregiver today.   ROS  Ten systems reviewed and is negative except as mentioned in HPI   Objective  Vitals:   09/22/22 1430  BP: 120/72  Pulse: 88  Resp: 14  Temp: 97.9 F  (36.6 C)  TempSrc: Oral  SpO2: 96%  Weight: 172 lb 6.4 oz (78.2 kg)  Height: 5\' 3"  (1.6 m)    Body mass index is 30.54 kg/m.  Physical Exam  Constitutional: Patient appears well-developed and well-nourished. Obese  No distress.  HEENT: head atraumatic, normocephalic, pupils equal and reactive to light, neck supple,  Cardiovascular: Normal rate, regular rhythm and normal heart sounds.  No murmur heard. No BLE edema. Pulmonary/Chest: Effort normal and breath sounds normal. No respiratory distress. Abdominal: Soft.  There is no tenderness. Psychiatric: Patient has a normal mood and affect. behavior is normal. Judgment and thought content normal.    PHQ2/9:    05/24/2022   10:28 AM 04/16/2022    7:57 AM 02/04/2022    8:23 AM 11/19/2021    9:25 AM 07/22/2021   11:32 AM  Depression screen PHQ 2/9  Decreased Interest 1 0 0 0 0  Down, Depressed, Hopeless 0 0 0 0 0  PHQ - 2 Score 1 0 0 0 0  Altered sleeping 1 1 0  0  Tired, decreased energy 0 0 0  0  Change in appetite 1 1 0  0  Feeling bad or failure about yourself  0 0 0  0  Trouble concentrating 0 0 0  0  Moving slowly or fidgety/restless 0 0 0  0  Suicidal thoughts 0 0 0  0  PHQ-9 Score 3 2 0  0  Difficult doing work/chores Not difficult at all        phq 9 is negative   Fall Risk:    09/22/2022    2:32 PM 05/24/2022   10:28 AM 04/16/2022    7:57 AM 02/04/2022    8:26 AM 11/19/2021    9:25 AM  Fall Risk   Falls in the past year? 0 0 0 0 0  Number falls in past yr:   0  0  Injury with Fall?   0  0  Risk for fall due to : No Fall Risks  Impaired balance/gait No Fall Risks No Fall Risks  Follow up Falls prevention discussed;Education provided;Falls evaluation completed Falls prevention discussed;Education provided;Falls evaluation completed Falls prevention discussed Falls prevention discussed;Education provided;Falls  evaluation completed Falls prevention discussed      Assessment & Plan  1. Atherosclerosis of  aorta (HCC)  On statin and eliquis   2. PAF (paroxysmal atrial fibrillation) (HCC)  Stable, rate controlled on Eliquis  3. Senile purpura ([place])  Reassurance given   4. Mixed hyperlipidemia  - rosuvastatin (CRESTOR) 10 MG tablet; Take 1 tablet (10 mg total) by mouth daily.  Dispense: 90 tablet; Refill: 1  5. History of CVA (cerebrovascular accident) without residual deficits  - rosuvastatin (CRESTOR) 10 MG tablet; Take 1 tablet (10 mg total) by mouth daily.  Dispense: 90 tablet; Refill: 1  6. Essential hypertension  - amLODipine (NORVASC) 2.5 MG tablet; TAKE 2 TABLETS BY MOUTH IN THE MORNING AND 1 TABLET IN THE EVENING, ONLY IF BLOOD PRESSURE IS ABOVE 120/80  Dispense: 270 tablet; Refill: 1  7. Vitamin D deficiency  Continue vitamin D otc  8. Lumbar stenosis with neurogenic claudication  Doing well at this time  9. Occipital neuralgia of right side   10. OSA (obstructive sleep apnea)  Unable to tolerate CPAP  11. Mild intermittent asthma without complication

## 2022-09-22 ENCOUNTER — Ambulatory Visit (INDEPENDENT_AMBULATORY_CARE_PROVIDER_SITE_OTHER): Payer: Medicare Other | Admitting: Family Medicine

## 2022-09-22 ENCOUNTER — Encounter: Payer: Self-pay | Admitting: Family Medicine

## 2022-09-22 VITALS — BP 120/72 | HR 88 | Temp 97.9°F | Resp 14 | Ht 63.0 in | Wt 172.4 lb

## 2022-09-22 DIAGNOSIS — I480 Paroxysmal atrial fibrillation: Secondary | ICD-10-CM | POA: Diagnosis not present

## 2022-09-22 DIAGNOSIS — E559 Vitamin D deficiency, unspecified: Secondary | ICD-10-CM

## 2022-09-22 DIAGNOSIS — G4733 Obstructive sleep apnea (adult) (pediatric): Secondary | ICD-10-CM | POA: Diagnosis not present

## 2022-09-22 DIAGNOSIS — M48062 Spinal stenosis, lumbar region with neurogenic claudication: Secondary | ICD-10-CM | POA: Diagnosis not present

## 2022-09-22 DIAGNOSIS — I10 Essential (primary) hypertension: Secondary | ICD-10-CM

## 2022-09-22 DIAGNOSIS — Z8673 Personal history of transient ischemic attack (TIA), and cerebral infarction without residual deficits: Secondary | ICD-10-CM | POA: Diagnosis not present

## 2022-09-22 DIAGNOSIS — M5481 Occipital neuralgia: Secondary | ICD-10-CM

## 2022-09-22 DIAGNOSIS — J4520 Mild intermittent asthma, uncomplicated: Secondary | ICD-10-CM | POA: Diagnosis not present

## 2022-09-22 DIAGNOSIS — D692 Other nonthrombocytopenic purpura: Secondary | ICD-10-CM

## 2022-09-22 DIAGNOSIS — I700 Atherosclerosis of aorta: Secondary | ICD-10-CM | POA: Diagnosis not present

## 2022-09-22 DIAGNOSIS — E782 Mixed hyperlipidemia: Secondary | ICD-10-CM | POA: Diagnosis not present

## 2022-09-22 MED ORDER — ROSUVASTATIN CALCIUM 10 MG PO TABS: 10.0000 mg | ORAL_TABLET | Freq: Every day | ORAL | 1 refills | Status: DC

## 2022-09-22 MED ORDER — AMLODIPINE BESYLATE 2.5 MG PO TABS: ORAL_TABLET | ORAL | 1 refills | Status: DC

## 2022-09-22 NOTE — Patient Instructions (Signed)
OOFOS for the house they have clogs or sandals

## 2022-10-12 ENCOUNTER — Encounter: Payer: Self-pay | Admitting: Podiatry

## 2022-10-12 ENCOUNTER — Ambulatory Visit: Payer: Medicare Other | Admitting: Podiatry

## 2022-10-12 VITALS — BP 135/70 | HR 52

## 2022-10-12 DIAGNOSIS — M722 Plantar fascial fibromatosis: Secondary | ICD-10-CM

## 2022-10-12 MED ORDER — BETAMETHASONE SOD PHOS & ACET 6 (3-3) MG/ML IJ SUSP
3.0000 mg | Freq: Once | INTRAMUSCULAR | Status: AC
  Administered 2022-10-12: 3 mg via INTRA_ARTICULAR

## 2022-10-12 NOTE — Progress Notes (Signed)
   Chief Complaint  Patient presents with   Plantar Fasciitis    "It's gotten a little better."    Subjective: 82 y.o. female presenting today for follow-up patient with plantar fasciitis to left heel this been ongoing for about 2 months now.  Modest improvement.  She says the injection helped modestly as well as the Medrol Dosepak.  She is unable to take NSAIDs.  She continues wearing good supportive tennis shoes.  No new complaints at this time   Past Medical History:  Diagnosis Date   History of stress test    a. 02/2013 Nl stress test.   Hypertension    Insomnia, unspecified    Mitral regurgitation    a. echo 01/2016: nl LV sys fxn, mild MR, w/o pulm htn, nl atrial size   Osteoporosis, unspecified    Paroxysmal atrial fibrillation 01/2016   a. diagnosed 01/2016; b. has been on eliquis, pradaxa, and xarelto at varying times 2/2 cost of each medication-->currently on eliquis (11/2016)   Pure hypercholesterolemia    Sleep apnea    Unable to tolerate nocturnal PAP therapy   TIA (transient ischemic attack)    Unspecified asthma(493.90)    Vertigo      Objective: Physical Exam General: The patient is alert and oriented x3 in no acute distress.  Dermatology: Skin is warm, dry and supple bilateral lower extremities. Negative for open lesions or macerations bilateral.   Vascular: Dorsalis Pedis and Posterior Tibial pulses palpable bilateral.  Capillary fill time is immediate to all digits.  Neurological: Grossly intact via light touch  Musculoskeletal: There continues to be tenderness to palpation to the plantar aspect of the left heel along the plantar fascia. All other joints range of motion within normal limits bilateral. Strength 5/5 in all groups bilateral.   Radiographic exam LT foot 09/14/2022: Normal osseous mineralization. Joint spaces preserved. No fracture/dislocation/boney destruction. No other soft tissue abnormalities or radiopaque foreign bodies.   Assessment: 1.  Plantar fasciitis left foot  Plan of Care:  -Patient evaluated.    -Injection of 0.5cc Celestone soluspan injected into the left plantar fascia.  -Patient is on anticoagulant medication.  No NSAIDs.  Recommend OTC Tylenol as needed -Continue plantar fascia brace -Continue daily stretching exercises which were demonstrated today.  Patient declined physical therapy -Return to clinic 4 weeks   Felecia Shelling, DPM [facility]  Dr. Felecia Shelling, DPM    [address] [place], [place] [address]                Office [phone]  Fax [phone]

## 2022-11-12 ENCOUNTER — Ambulatory Visit: Payer: Medicare Other | Admitting: Podiatry

## 2022-12-07 DIAGNOSIS — M79641 Pain in right hand: Secondary | ICD-10-CM | POA: Diagnosis not present

## 2022-12-07 DIAGNOSIS — M79672 Pain in left foot: Secondary | ICD-10-CM | POA: Diagnosis not present

## 2022-12-07 DIAGNOSIS — M79671 Pain in right foot: Secondary | ICD-10-CM | POA: Diagnosis not present

## 2022-12-07 DIAGNOSIS — M79642 Pain in left hand: Secondary | ICD-10-CM | POA: Diagnosis not present

## 2022-12-08 DIAGNOSIS — M79672 Pain in left foot: Secondary | ICD-10-CM | POA: Diagnosis not present

## 2022-12-08 DIAGNOSIS — M19041 Primary osteoarthritis, right hand: Secondary | ICD-10-CM | POA: Diagnosis not present

## 2022-12-08 DIAGNOSIS — M7732 Calcaneal spur, left foot: Secondary | ICD-10-CM | POA: Diagnosis not present

## 2022-12-08 DIAGNOSIS — M79642 Pain in left hand: Secondary | ICD-10-CM | POA: Diagnosis not present

## 2022-12-12 ENCOUNTER — Other Ambulatory Visit: Payer: Self-pay | Admitting: Family Medicine

## 2022-12-12 DIAGNOSIS — I10 Essential (primary) hypertension: Secondary | ICD-10-CM

## 2023-01-11 ENCOUNTER — Other Ambulatory Visit: Payer: Self-pay | Admitting: Cardiovascular Disease

## 2023-01-11 DIAGNOSIS — I480 Paroxysmal atrial fibrillation: Secondary | ICD-10-CM

## 2023-01-11 NOTE — Telephone Encounter (Signed)
Refill request

## 2023-01-11 NOTE — Telephone Encounter (Signed)
Prescription refill request for Eliquis received. Indication:AFIB Last office visit:12/23 Scr:1.0  6/24 Age: 82 Weight:78.2  kg  Prescription refilled

## 2023-01-20 ENCOUNTER — Telehealth: Payer: Self-pay

## 2023-01-20 NOTE — Telephone Encounter (Signed)
Contacted patient on preferred number listed in notes for scheduled AWV. Patient stated AWV was completed in home on 01/14/23.

## 2023-01-21 NOTE — Progress Notes (Unsigned)
Name: [name]   MRN: [id number]    DOB: 1940-11-24   Date:01/24/2023       Progress Note  Subjective  Chief Complaint  Follow Up  HPI  HTN: bp is at goal no chest pain , no recent episodes of palpitation and only has SOB with moderate activity.  BP is at goal  norvasc to 5 mg in am and 2.5 mg in pm She has clonidine on file but has not used it in a long time. She has notice orthostatic changes when getting out of bed in the morning, advised to try going down to 2.5 mg in am and 2.5 mg in pm and let me know how the bp readings go  Pre-diabetes: last dropped from 6.1 % to 5.7 %  Denies polyphagia, polydipsia or polyuria .  Paroxysmal Afib: sees Dr. Kirke Corin, symptoms started August 2017, she is on Eliquis daily  and prn Cardizem, she states she uses less than once a month and is doing well , she has not used Cardizem in a long time     History of CVA/TIA and also aneursym of left para ophthalmic artery , under the care of Dr. Sherryll Burger - neurologist.  She is now taking Crestor and denies side effects.. She has occasional right frontal headache and soreness on right side of neck and per Dr. Margaretmary Eddy note states occipital neuralgia but is doing better now She states symptoms are mild Aneurysm monitored by Dr. Sherryll Burger and last MRA was done 03/2022    MPRESSION:03/2022 MRI/MRA 1. No acute intracranial process. 2. No intracranial large vessel occlusion or significant stenosis. 3. Redemonstrated 3 mm superiorly oriented left paraophthalmic ICA aneurysm.      Asthma: she states symptoms are controlled a this time, she denies cough, wheezing or SOB. Only taking albuterol prn and at less than once a month . She states a couple of weeks ago she has some wheezing but resolved quickly and did not use albuterol   OSA: seen by Dr. Sherryll Burger - neurologist , unable to tolerate CPAP, sleep study done 06/30/2018 , she was supposed to have an oral appliance but not covered by insurance She will check with her dentist  again    Vertigo: she has vertigo with head movement, motion sickness, and when she has an episode she needs to go to [facility] by EMS because of severe nausea, vomiting and diarrhea 06/2016. She had Summer of 2020 but resolved by itself . Last flare was Oct 2023. Unchanged  Bilateral knee pain: she had right knee replacement Feb 2023, completed PT, not ready to have surgery on the left knee yet . She had synvisc injections and states knee pain is under control   Low back pain with radiculitis to left side: had to go to [facility] end of 2022, saw Dr. Council Mechanic and had two epidural injections, pain on left resolved, she states doing well now occasionally takes Tylenol for pain and is doing well   Atherosclerosis of aorta: found on CT abdomen done 10/22, continue eliquis and statin therapy. Last LDL was at goal -46 .   Senile purpura: on left arm today , unchanged   Joint aches: she mentioned to neurologist in June and had a negative sed rate, CRP , ANA profile, symptoms are improving now, except for left thumb and wrist Discussed topical medication  Patient Active Problem List   Diagnosis Date Noted   Senile purpura (HCC) 05/24/2022   Benign paroxysmal positional vertigo 05/24/2022   Thrombocytosis  11/19/2021   Mixed hyperlipidemia 11/19/2021   Atherosclerosis of aorta (HCC) 11/19/2021   Mild protein-calorie malnutrition (HCC) 11/19/2021   OSA (obstructive sleep apnea) 11/19/2021   Pre-diabetes 11/19/2021   Lumbar stenosis with neurogenic claudication 11/19/2021   Vitamin D deficiency 11/19/2021   History of total right knee replacement 08/14/2021   Osteoarthritis of right knee 05/05/2020   History of colonic polyps    Polyp of transverse colon    Aneurysm (HCC) 02/08/2018   History of CVA (cerebrovascular accident) without residual deficits 01/24/2018   Aneurysm of ophthalmic artery 01/24/2018   Impingement syndrome of shoulder region 01/02/2018   History of cataract surgery, right 09/01/2016    Paroxysmal atrial fibrillation (HCC) 02/24/2016   Vertigo 02/24/2016   Low HDL (under 40) 02/17/2016   Hx of colonic polyps    Benign neoplasm of sigmoid colon    Mild intermittent asthma without complication 02/21/2015   Hyperglycemia 02/21/2015   Essential hypertension 02/20/2015   Inconclusive mammogram due to dense breasts 01/02/2015   Dense breast 01/02/2015   Abnormal EKG 02/23/2013    Past Surgical History:  Procedure Laterality Date   ABDOMINAL HYSTERECTOMY     CATARACT EXTRACTION Bilateral 08/31/2016   COLONOSCOPY WITH PROPOFOL N/A 03/04/2015   Procedure: COLONOSCOPY WITH PROPOFOL;  Surgeon: Midge Minium, MD;  Location: [facility] ENDOSCOPY;  Service: Endoscopy;  Laterality: N/A;   COLONOSCOPY WITH PROPOFOL N/A 04/01/2020   Procedure: COLONOSCOPY WITH PROPOFOL;  Surgeon: Midge Minium, MD;  Location: [facility] ENDOSCOPY;  Service: Endoscopy;  Laterality: N/A;   DIAGNOSTIC LAPAROSCOPY     REMOVAL OF SCAR TISSUE (ADHESIONS)   KNEE ARTHROPLASTY Right 08/14/2021   Procedure: COMPUTER ASSISTED TOTAL KNEE ARTHROPLASTY - RNFA;  Surgeon: Donato Heinz, MD;  Location: [facility] ORS;  Service: Orthopedics;  Laterality: Right;   KNEE ARTHROSCOPY Right 10/08/2020   Procedure: ARTHROSCOPY KNEE;  Surgeon: Donato Heinz, MD;  Location: [facility] ORS;  Service: Orthopedics;  Laterality: Right;   KNEE ARTHROSCOPY Right 10/21/2020   Procedure: Arthroscopic irrigation and debridement right knee;  Surgeon: Donato Heinz, MD;  Location: [facility] ORS;  Service: Orthopedics;  Laterality: Right;    Family History  Problem Relation Age of Onset   COPD Father    Hypertension Mother    Aneurysm Mother    Hypertension Brother    Stroke Brother    Cancer Brother        pancreatic   Healthy Brother     Social History   Tobacco Use   Smoking status: Never   Smokeless tobacco: Never   Tobacco comments:    smoking cessation materials not required  Substance Use Topics   Alcohol use: No    Alcohol/week: 0.0  standard drinks of alcohol     Current Outpatient Medications:    acetaminophen (TYLENOL) 650 MG CR tablet, Take 650 mg by mouth every 8 (eight) hours as needed for pain., Disp: , Rfl:    albuterol (VENTOLIN HFA) 108 (90 Base) MCG/ACT inhaler, INHALE 2 PUFFS INTO THE LUNGS EVERY 6 HOURS AS NEEDED FOR WHEEZING OR SHORTNESS OF BREATH, Disp: 6.7 g, Rfl: 0   amLODipine (NORVASC) 2.5 MG tablet, TAKE 2 TABLETS BY MOUTH IN THE MORNING AND 1 TABLET IN THE EVENING, ONLY IF BLOOD PRESSURE IS ABOVE 120/80, Disp: 270 tablet, Rfl: 1   Calcium-Magnesium-Vitamin D (CALCIUM 1200+D3 PO), Take 1 tablet by mouth 2 (two) times a week. 1200 mg calcium / 1000 units Vitamin D3, Disp: , Rfl:    Cholecalciferol (VITAMIN D)  50 MCG (2000 UT) CAPS, Take 1 capsule by mouth daily at 12 noon., Disp: , Rfl:    cloNIDine (CATAPRES) 0.1 MG tablet, as needed., Disp: , Rfl:    diltiazem (CARDIZEM) 30 MG tablet, Take 1 tablet (30 mg total) by mouth every 6 (six) hours as needed (for tachycardia/recurrent Afib.)., Disp: 30 tablet, Rfl: 3   ELIQUIS 5 MG TABS tablet, TAKE 1 TABLET(5 MG) BY MOUTH TWICE DAILY, Disp: 60 tablet, Rfl: 5   promethazine (PHENERGAN) 12.5 MG tablet, Take 0.5 tablets (6.25 mg total) by mouth every 6 (six) hours as needed for up to 5 days for nausea or vomiting., Disp: 10 tablet, Rfl: 0   rosuvastatin (CRESTOR) 10 MG tablet, Take 1 tablet (10 mg total) by mouth daily., Disp: 90 tablet, Rfl: 1  Allergies  Allergen Reactions   Ace Inhibitors Swelling    Patient states her tongue swell.   Penicillins Anxiety    IgE = 251 (WNL) on 07/31/2021  Has patient had a PCN reaction causing immediate rash, facial/tongue/throat swelling, SOB or lightheadedness with hypotension: No Has patient had a PCN reaction causing severe rash involving mucus membranes or skin necrosis: No Has patient had a PCN reaction that required hospitalization No Has patient had a PCN reaction occurring within the last 10 years: No If all of the  above answers are "NO", then may proceed with Cephalosporin use.    I personally reviewed active problem list, medication list, allergies, family history, social history, health maintenance with the patient/caregiver today.   ROS  Constitutional: Negative for fever or weight change.  Respiratory: Negative for cough and shortness of breath.   Cardiovascular: Negative for chest pain or palpitations.  Gastrointestinal: Negative for abdominal pain, no bowel changes.  Musculoskeletal: Negative for gait problem or joint swelling.  Skin: Negative for rash.  Neurological: Negative for dizziness or headache.  No other specific complaints in a complete review of systems (except as listed in HPI above).   Objective  Vitals:   01/24/23 0938  BP: 122/74  Pulse: 86  Resp: 16  SpO2: 98%  Weight: 175 lb (79.4 kg)  Height: 5\' 3"  (1.6 m)    Body mass index is 31 kg/m.  Physical Exam  Constitutional: Patient appears well-developed and well-nourished. Obese  No distress.  HEENT: head atraumatic, normocephalic, pupils equal and reactive to light, neck supple Cardiovascular: Normal rate, regular rhythm and normal heart sounds.  No murmur heard. No BLE edema. Pulmonary/Chest: Effort normal and breath sounds normal. No respiratory distress. Abdominal: Soft.  There is no tenderness. Psychiatric: Patient has a normal mood and affect. behavior is normal. Judgment and thought content normal.   PHQ2/9:    01/24/2023    9:38 AM 05/24/2022   10:28 AM 04/16/2022    7:57 AM 02/04/2022    8:23 AM 11/19/2021    9:25 AM  Depression screen PHQ 2/9  Decreased Interest 0 1 0 0 0  Down, Depressed, Hopeless 0 0 0 0 0  PHQ - 2 Score 0 1 0 0 0  Altered sleeping 0 1 1 0   Tired, decreased energy 0 0 0 0   Change in appetite 0 1 1 0   Feeling bad or failure about yourself  0 0 0 0   Trouble concentrating 0 0 0 0   Moving slowly or fidgety/restless 0 0 0 0   Suicidal thoughts 0 0 0 0   PHQ-9 Score 0 3 2 0    Difficult doing work/chores  Not difficult at all       phq 9 is negative   Fall Risk:    01/24/2023    9:38 AM 09/22/2022    2:32 PM 05/24/2022   10:28 AM 04/16/2022    7:57 AM 02/04/2022    8:26 AM  Fall Risk   Falls in the past year? 0 0 0 0 0  Number falls in past yr: 0   0   Injury with Fall? 0   0   Risk for fall due to : No Fall Risks No Fall Risks  Impaired balance/gait No Fall Risks  Follow up Falls prevention discussed Falls prevention discussed;Education provided;Falls evaluation completed Falls prevention discussed;Education provided;Falls evaluation completed Falls prevention discussed Falls prevention discussed;Education provided;Falls evaluation completed      Functional Status Survey: Is the patient deaf or have difficulty hearing?: No Does the patient have difficulty seeing, even when wearing glasses/contacts?: No Does the patient have difficulty concentrating, remembering, or making decisions?: No Does the patient have difficulty walking or climbing stairs?: No Does the patient have difficulty dressing or bathing?: No Does the patient have difficulty doing errands alone such as visiting a doctor's office or shopping?: No    Assessment & Plan  1. PAF (paroxysmal atrial fibrillation) (HCC)  Rate controlled , on eliquis and statin therapy, sees cardiologist   2. Atherosclerosis of aorta (HCC)  - rosuvastatin (CRESTOR) 10 MG tablet; Take 1 tablet (10 mg total) by mouth daily.  Dispense: 90 tablet; Refill: 1  3. Aneurysm (HCC)  Monitored by Dr. Sherryll Burger  - rosuvastatin (CRESTOR) 10 MG tablet; Take 1 tablet (10 mg total) by mouth daily.  Dispense: 90 tablet; Refill: 1  4. Senile purpura (HCC)  Reassurance   5. Essential hypertension  BP is at goal  - amLODipine (NORVASC) 2.5 MG tablet; TAKE 2 TABLETS BY MOUTH IN THE MORNING AND 1 TABLET IN THE EVENING, ONLY IF BLOOD PRESSURE IS ABOVE 120/80  Dispense: 270 tablet; Refill: 1  6. OSA (obstructive sleep  apnea)  Not on CPAP, could not tolerated it  7. Occipital neuralgia of right side  Under the care of Dr. Sherryll Burger   8. Vitamin D deficiency  Continue vitamin D supplementation   9. Lumbar stenosis with neurogenic claudication  stable  10. History of CVA (cerebrovascular accident) without residual deficits  - rosuvastatin (CRESTOR) 10 MG tablet; Take 1 tablet (10 mg total) by mouth daily.  Dispense: 90 tablet; Refill: 1  11. Mixed hyperlipidemia  On statin therapy   12. Mild intermittent asthma without complication  - albuterol (VENTOLIN HFA) 108 (90 Base) MCG/ACT inhaler; Inhale 2 puffs into the lungs every 6 (six) hours as needed for wheezing or shortness of breath.  Dispense: 6.7 g; Refill: 0

## 2023-01-24 ENCOUNTER — Ambulatory Visit (INDEPENDENT_AMBULATORY_CARE_PROVIDER_SITE_OTHER): Payer: Medicare Other | Admitting: Family Medicine

## 2023-01-24 ENCOUNTER — Encounter: Payer: Self-pay | Admitting: Family Medicine

## 2023-01-24 VITALS — BP 122/74 | HR 86 | Resp 16 | Ht 63.0 in | Wt 175.0 lb

## 2023-01-24 DIAGNOSIS — D692 Other nonthrombocytopenic purpura: Secondary | ICD-10-CM

## 2023-01-24 DIAGNOSIS — I700 Atherosclerosis of aorta: Secondary | ICD-10-CM

## 2023-01-24 DIAGNOSIS — G4733 Obstructive sleep apnea (adult) (pediatric): Secondary | ICD-10-CM | POA: Diagnosis not present

## 2023-01-24 DIAGNOSIS — E559 Vitamin D deficiency, unspecified: Secondary | ICD-10-CM

## 2023-01-24 DIAGNOSIS — E782 Mixed hyperlipidemia: Secondary | ICD-10-CM | POA: Diagnosis not present

## 2023-01-24 DIAGNOSIS — Z8673 Personal history of transient ischemic attack (TIA), and cerebral infarction without residual deficits: Secondary | ICD-10-CM | POA: Diagnosis not present

## 2023-01-24 DIAGNOSIS — M48062 Spinal stenosis, lumbar region with neurogenic claudication: Secondary | ICD-10-CM

## 2023-01-24 DIAGNOSIS — I729 Aneurysm of unspecified site: Secondary | ICD-10-CM

## 2023-01-24 DIAGNOSIS — I10 Essential (primary) hypertension: Secondary | ICD-10-CM

## 2023-01-24 DIAGNOSIS — I480 Paroxysmal atrial fibrillation: Secondary | ICD-10-CM | POA: Diagnosis not present

## 2023-01-24 DIAGNOSIS — J4520 Mild intermittent asthma, uncomplicated: Secondary | ICD-10-CM

## 2023-01-24 DIAGNOSIS — M5481 Occipital neuralgia: Secondary | ICD-10-CM

## 2023-01-24 MED ORDER — ROSUVASTATIN CALCIUM 10 MG PO TABS: 10.0000 mg | ORAL_TABLET | Freq: Every day | ORAL | 1 refills | Status: DC

## 2023-01-24 MED ORDER — ALBUTEROL SULFATE HFA 108 (90 BASE) MCG/ACT IN AERS: 2.0000 | INHALATION_SPRAY | Freq: Four times a day (QID) | RESPIRATORY_TRACT | 0 refills | Status: DC | PRN

## 2023-01-24 MED ORDER — AMLODIPINE BESYLATE 2.5 MG PO TABS: ORAL_TABLET | ORAL | 1 refills | Status: DC

## 2023-02-09 ENCOUNTER — Encounter: Payer: Medicare Other | Admitting: Family Medicine

## 2023-02-20 IMAGING — MR MR ABDOMEN WO/W CM
18 series · 48 of 48 positions shown · IV contrast (multihance)
Comparison: CT abdomen pelvis, 03/29/2021

CLINICAL DATA: Characterize incidental liver lesions identified by
prior CT

EXAM:
MRI ABDOMEN WITHOUT AND WITH CONTRAST
TECHNIQUE: Multiplanar multisequence MR imaging of the abdomen was performed
both before and after the administration of intravenous contrast.
CONTRAST:  7mL MULTIHANCE GADOBENATE DIMEGLUMINE 529 MG/ML IV SOLN

[Series 3: T2 · coronal · 6.0mm · 1.19mm/px · 2 of 30 slices shown (1 of 2)]
[im 1/30]
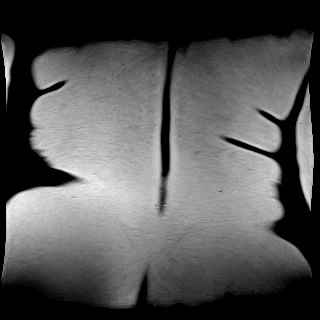
[im 30/30]
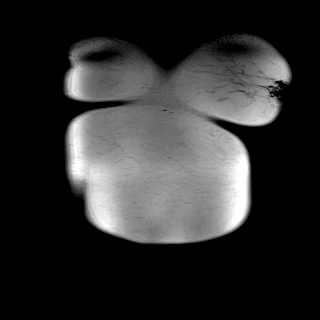

[Series 4: T2 · axial · 6.0mm · 1.19mm/px · z∈[-75,+147]mm · 2 of 32 slices shown (2 of 2)]
[im 1/32]
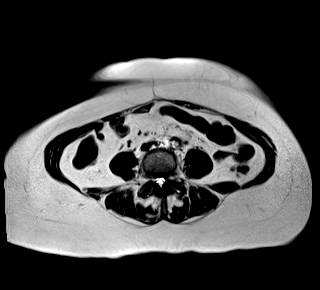
[im 32/32]
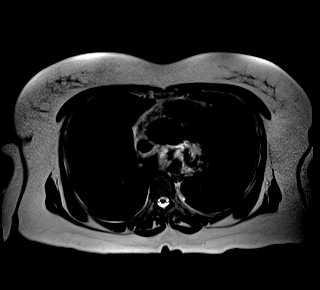

[Series 6: T2 fat-sat · axial · 6.0mm · 1.19mm/px · z∈[-82,+154]mm · 2 of 34 slices shown]
[im 1/34]
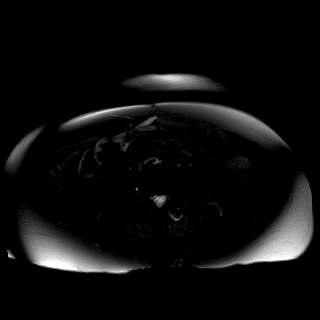
[im 34/34]
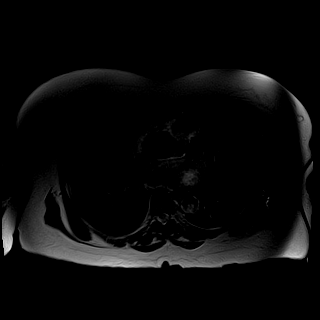

[Series 7: ax dwi_tracew · axial · 6.0mm · 1.42mm/px · z∈[-82,+154]mm · 4 of 102 slices shown]
[im 1/102]
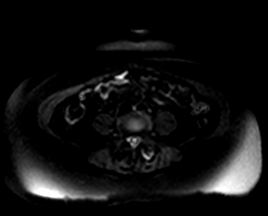
[im 34/102]
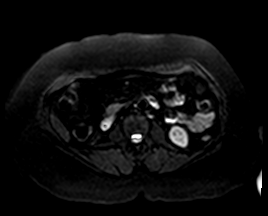
[im 68/102]
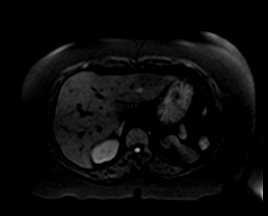
[im 102/102]
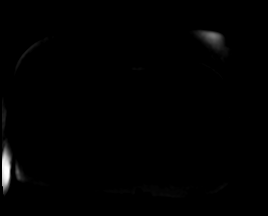

[Series 8: ax dwi_adc · axial · 6.0mm · 1.42mm/px · 1 of 34 slices shown]
[im 1/34]
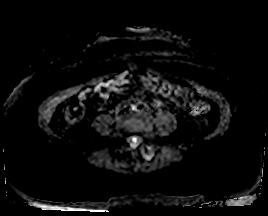

[Series 9: T1 · axial · 3.0mm · 1.19mm/px · z∈[-37,+175]mm · 3 of 72 slices shown (1 of 2)]
[im 1/72]
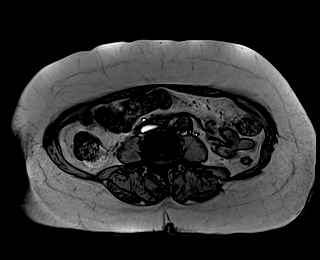
[im 36/72]
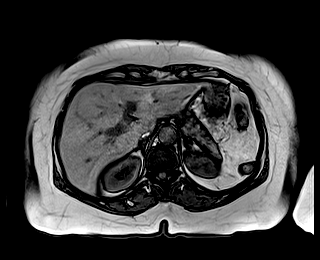
[im 72/72]
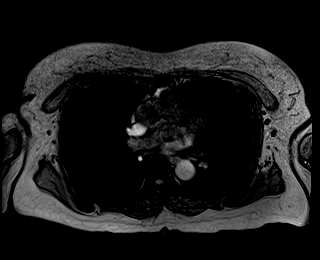

[Series 10: T1 · axial · 3.0mm · 1.19mm/px · z∈[-37,+175]mm · 3 of 72 slices shown (2 of 2)]
[im 1/72]
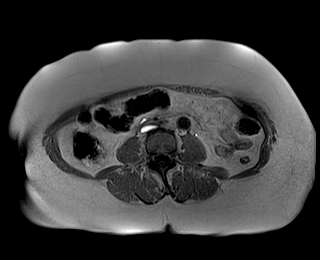
[im 36/72]
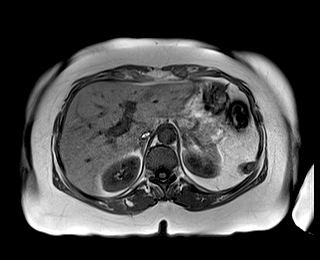
[im 72/72]
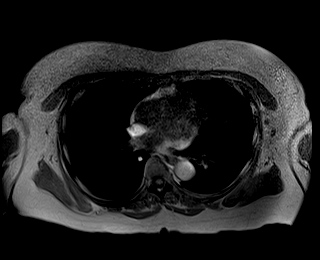

[Series 11: bSSFP · axial · 6.0mm · 0.74mm/px · 1 of 32 slices shown]
[im 1/32]
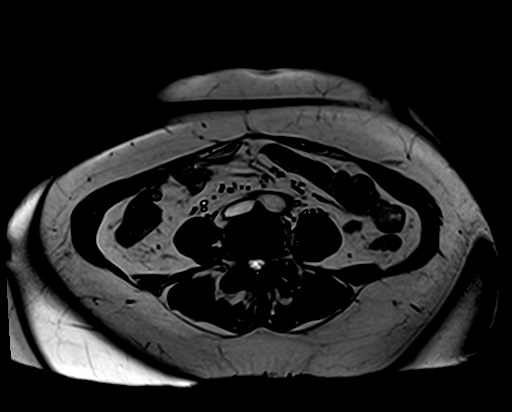

[Series 12: T1 dynamic fat-sat · axial · non-contrast · 3.0mm · 1.19mm/px · z∈[-48,+187]mm · 3 of 80 slices shown (1 of 5)]
[im 1/80]
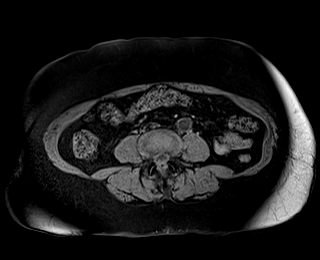
[im 40/80]
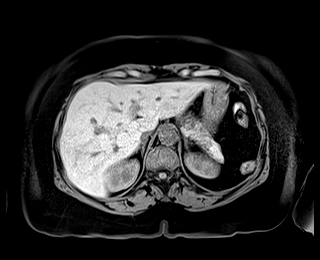
[im 80/80]
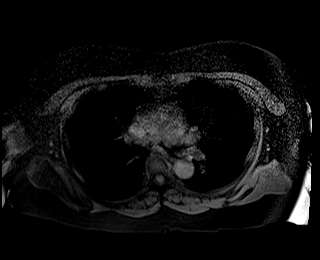

[Series 13: T1 dynamic fat-sat post-contrast · axial · 3.0mm · 1.19mm/px · z∈[-48,+187]mm · 3 of 80 slices shown (1 of 4)]
[im 1/80]
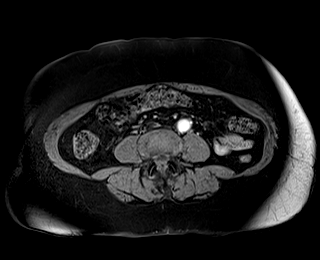
[im 40/80]
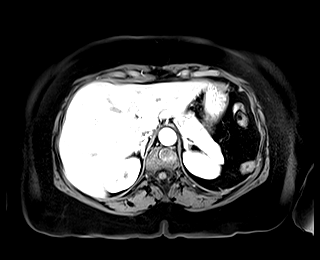
[im 80/80]
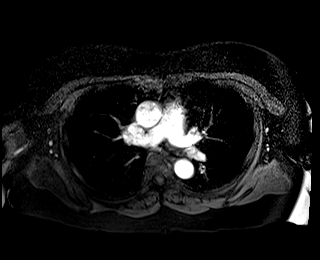

[Series 14: T1 dynamic fat-sat · axial · 3.0mm · 1.19mm/px · z∈[-48,+187]mm · 3 of 80 slices shown (2 of 5)]
[im 1/80]
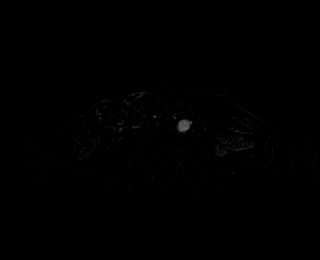
[im 40/80]
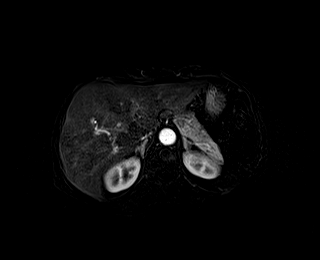
[im 80/80]
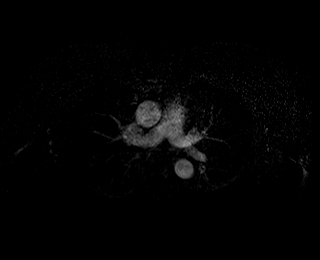

[Series 15: T1 dynamic fat-sat post-contrast · axial · 3.0mm · 1.19mm/px · z∈[-48,+187]mm · 3 of 80 slices shown (2 of 4)]
[im 1/80]
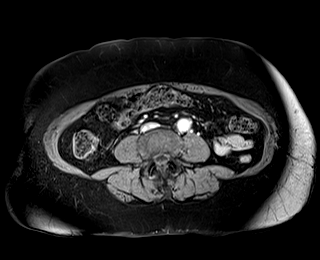
[im 40/80]
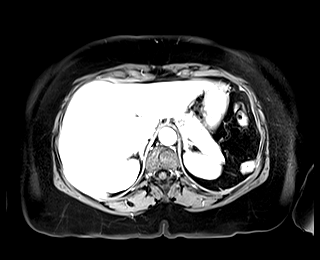
[im 80/80]
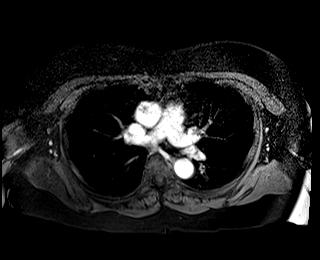

[Series 16: T1 dynamic fat-sat · axial · 3.0mm · 1.19mm/px · z∈[-48,+187]mm · 3 of 80 slices shown (3 of 5)]
[im 1/80]
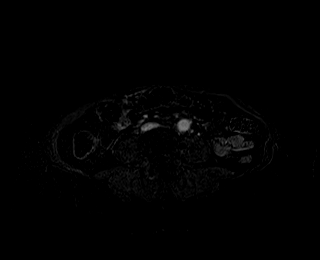
[im 40/80]
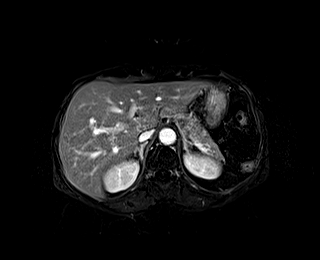
[im 80/80]
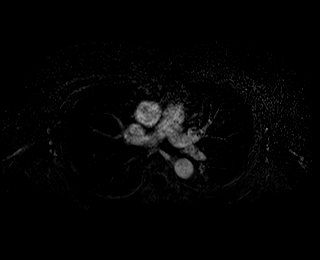

[Series 17: T1 dynamic fat-sat post-contrast · axial · 3.0mm · 1.19mm/px · z∈[-48,+187]mm · 3 of 80 slices shown (3 of 4)]
[im 1/80]
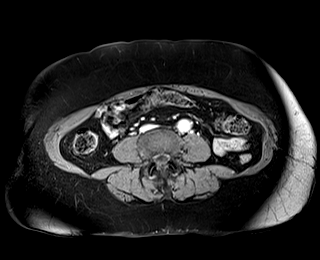
[im 40/80]
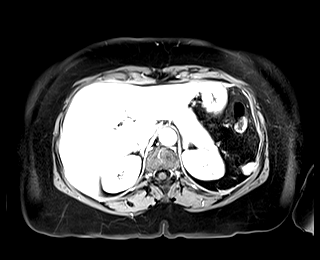
[im 80/80]
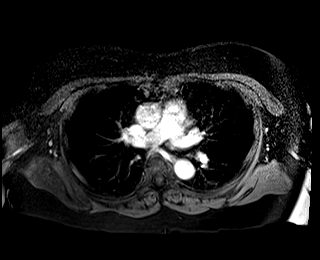

[Series 18: T1 dynamic fat-sat · axial · 3.0mm · 1.19mm/px · z∈[-48,+187]mm · 3 of 80 slices shown (4 of 5)]
[im 1/80]
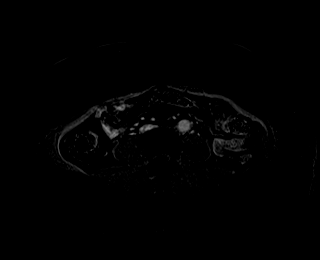
[im 40/80]
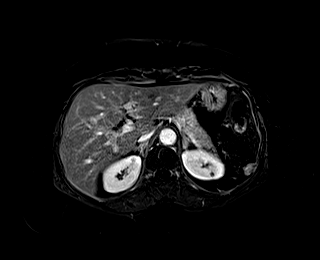
[im 80/80]
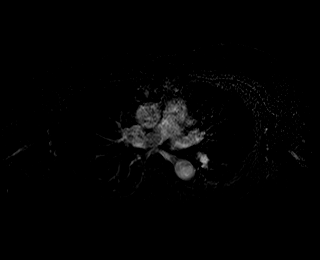

[Series 19: T1 dynamic post-contrast · coronal · 3.0mm · 1.31mm/px · 3 of 72 slices shown]
[im 1/72]
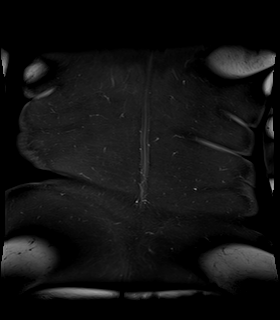
[im 36/72]
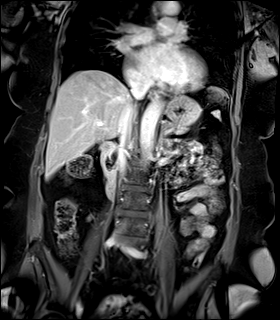
[im 72/72]
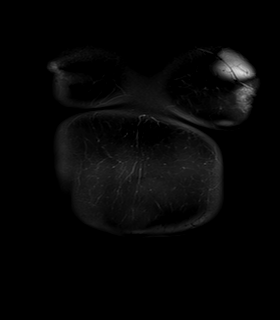

[Series 20: T1 dynamic fat-sat post-contrast · axial · 3.0mm · 1.19mm/px · z∈[-48,+187]mm · 3 of 80 slices shown (4 of 4)]
[im 1/80]
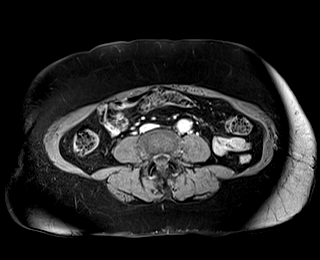
[im 40/80]
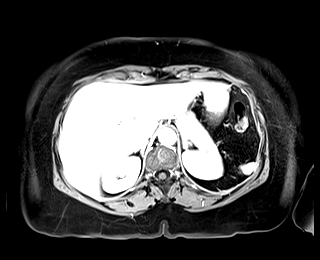
[im 80/80]
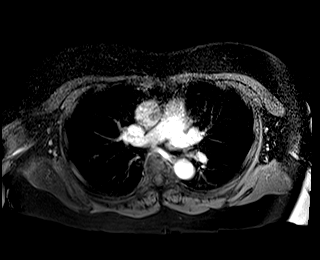

[Series 21: T1 dynamic fat-sat · axial · 3.0mm · 1.19mm/px · z∈[-48,+187]mm · 3 of 80 slices shown (5 of 5)]
[im 1/80]
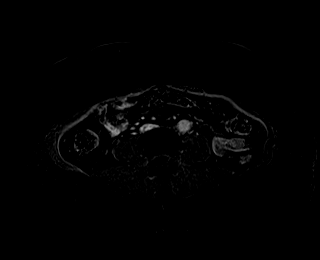
[im 40/80]
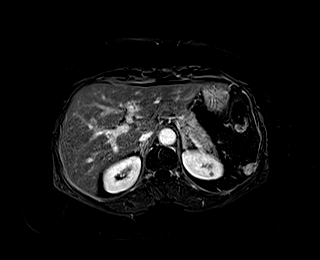
[im 80/80]
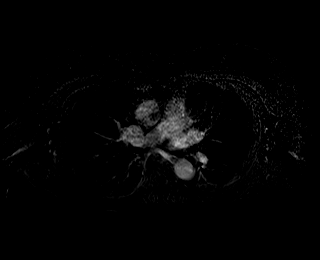

[48 of 48 positions shown; findings below may reference images not displayed]

FINDINGS: Lower chest: No acute findings.

Hepatobiliary: Two lesions of the left lobe of the liver described
by prior CT correspond to benign, nonenhancing simple cysts (series
6, image 15, 12). There are small scattered foci of transient
arterial hyperattenuation throughout the liver, consistent with tiny
flash filling hemangiomata or perfusion related transient intensity
differences. No solid mass or other parenchymal abnormality
identified. No gallstones. No biliary ductal dilatation.

Pancreas: No mass, inflammatory changes, or other parenchymal
abnormality identified. No pancreatic ductal dilatation.

Spleen:  Within normal limits in size and appearance.

Adrenals/Urinary Tract: No masses identified. No evidence of
hydronephrosis.

Stomach/Bowel: Visualized portions within the abdomen are
unremarkable.

Vascular/Lymphatic: No pathologically enlarged lymph nodes
identified. No abdominal aortic aneurysm demonstrated.

Other:  None.

Musculoskeletal: No suspicious bone lesions identified.
IMPRESSION: Two lesions of the left lobe of the liver described by prior CT
correspond to benign, nonenhancing simple cysts. No further
follow-up or characterization is required for these benign lesions.

## 2023-02-25 ENCOUNTER — Other Ambulatory Visit: Payer: Self-pay

## 2023-02-25 ENCOUNTER — Other Ambulatory Visit: Payer: Medicare Other

## 2023-02-25 DIAGNOSIS — I480 Paroxysmal atrial fibrillation: Secondary | ICD-10-CM

## 2023-02-25 MED ORDER — APIXABAN 5 MG PO TABS: ORAL_TABLET | ORAL | 5 refills | Status: DC

## 2023-03-18 ENCOUNTER — Other Ambulatory Visit: Payer: Self-pay

## 2023-03-21 ENCOUNTER — Other Ambulatory Visit: Payer: Medicare Other

## 2023-03-21 NOTE — Patient Instructions (Signed)
Thank you for allowing the Care Management team to participate in your care.  It was great speaking with you today!

## 2023-03-23 ENCOUNTER — Other Ambulatory Visit: Payer: Self-pay

## 2023-03-24 ENCOUNTER — Telehealth: Payer: Self-pay | Admitting: Family Medicine

## 2023-03-24 NOTE — Telephone Encounter (Signed)
Contacted [name] to schedule their annual wellness visit. Patient declined to schedule AWV at this time.Patient thinks it's a scam, because she get's to many calls. Please discuss with patient. She is confused between the appointment on 03/21/2023 and the [facility].   [facility] Care Guide [facility] AWV TEAM Direct Dial: [phone]

## 2023-03-30 ENCOUNTER — Other Ambulatory Visit: Payer: Self-pay | Admitting: Family Medicine

## 2023-03-30 DIAGNOSIS — Z1231 Encounter for screening mammogram for malignant neoplasm of breast: Secondary | ICD-10-CM

## 2023-04-05 ENCOUNTER — Ambulatory Visit
Admission: RE | Admit: 2023-04-05 | Discharge: 2023-04-05 | Disposition: A | Discharge status: Home / Self Care | Payer: Medicare Other | Source: Ambulatory Visit | Attending: Family Medicine | Admitting: Family Medicine

## 2023-04-05 DIAGNOSIS — Z1231 Encounter for screening mammogram for malignant neoplasm of breast: Secondary | ICD-10-CM | POA: Diagnosis present

## 2023-04-05 NOTE — Patient Outreach (Signed)
Care Management   Visit Note   Name: [name] MRN: [id number] DOB: 11-08-1940  Subjective: [name] is a 82 y.o. year old female who is a primary care patient of Alba Cory, MD. The Care Management team was consulted for assistance.      Call to Pharmacy Engaged with patient   Assessment:  Outpatient Encounter Medications as of 03/23/2023  Medication Sig   acetaminophen (TYLENOL) 650 MG CR tablet Take 650 mg by mouth every 8 (eight) hours as needed for pain.   albuterol (VENTOLIN HFA) 108 (90 Base) MCG/ACT inhaler Inhale 2 puffs into the lungs every 6 (six) hours as needed for wheezing or shortness of breath.   amLODipine (NORVASC) 2.5 MG tablet TAKE 2 TABLETS BY MOUTH IN THE MORNING AND 1 TABLET IN THE EVENING, ONLY IF BLOOD PRESSURE IS ABOVE 120/80   apixaban (ELIQUIS) 5 MG TABS tablet TAKE 1 TABLET(5 MG) BY MOUTH TWICE DAILY   Calcium-Magnesium-Vitamin D (CALCIUM 1200+D3 PO) Take 1 tablet by mouth 2 (two) times a week. 1200 mg calcium / 1000 units Vitamin D3   Cholecalciferol (VITAMIN D) 50 MCG (2000 UT) CAPS Take 1 capsule by mouth daily at 12 noon.   cloNIDine (CATAPRES) 0.1 MG tablet as needed.   diltiazem (CARDIZEM) 30 MG tablet Take 1 tablet (30 mg total) by mouth every 6 (six) hours as needed (for tachycardia/recurrent Afib.).   promethazine (PHENERGAN) 12.5 MG tablet Take 0.5 tablets (6.25 mg total) by mouth every 6 (six) hours as needed for up to 5 days for nausea or vomiting.   rosuvastatin (CRESTOR) 10 MG tablet Take 1 tablet (10 mg total) by mouth daily.   No facility-administered encounter medications on file as of 03/23/2023.

## 2023-04-05 NOTE — Patient Outreach (Signed)
Care Management   Visit Note   Name: [name] MRN: [id number] DOB: Aug 31, 1940  Subjective: [name] is a 82 y.o. year old female who is a primary care patient of Alba Cory, MD. The Care Management team was consulted for assistance.      Engaged with patient via telephone.  Assessment:  Outpatient Encounter Medications as of 03/21/2023  Medication Sig   acetaminophen (TYLENOL) 650 MG CR tablet Take 650 mg by mouth every 8 (eight) hours as needed for pain.   albuterol (VENTOLIN HFA) 108 (90 Base) MCG/ACT inhaler Inhale 2 puffs into the lungs every 6 (six) hours as needed for wheezing or shortness of breath.   amLODipine (NORVASC) 2.5 MG tablet TAKE 2 TABLETS BY MOUTH IN THE MORNING AND 1 TABLET IN THE EVENING, ONLY IF BLOOD PRESSURE IS ABOVE 120/80   apixaban (ELIQUIS) 5 MG TABS tablet TAKE 1 TABLET(5 MG) BY MOUTH TWICE DAILY   Calcium-Magnesium-Vitamin D (CALCIUM 1200+D3 PO) Take 1 tablet by mouth 2 (two) times a week. 1200 mg calcium / 1000 units Vitamin D3   Cholecalciferol (VITAMIN D) 50 MCG (2000 UT) CAPS Take 1 capsule by mouth daily at 12 noon.   cloNIDine (CATAPRES) 0.1 MG tablet as needed.   diltiazem (CARDIZEM) 30 MG tablet Take 1 tablet (30 mg total) by mouth every 6 (six) hours as needed (for tachycardia/recurrent Afib.).   promethazine (PHENERGAN) 12.5 MG tablet Take 0.5 tablets (6.25 mg total) by mouth every 6 (six) hours as needed for up to 5 days for nausea or vomiting.   rosuvastatin (CRESTOR) 10 MG tablet Take 1 tablet (10 mg total) by mouth daily.   No facility-administered encounter medications on file as of 03/21/2023.

## 2023-04-20 ENCOUNTER — Other Ambulatory Visit: Payer: Self-pay

## 2023-04-20 NOTE — Patient Outreach (Signed)
Care Management   Visit Note  04/20/2023 Name: DIEU MINION MRN: [id number] DOB: 09/09/1940  Subjective: [name] is a 82 y.o. year old female who is a primary care patient of Alba Cory, MD. The Care Management team was consulted for assistance.      Engaged with patient via telephone.  Assessment:  Outpatient Encounter Medications as of 04/20/2023  Medication Sig   acetaminophen (TYLENOL) 650 MG CR tablet Take 650 mg by mouth every 8 (eight) hours as needed for pain.   albuterol (VENTOLIN HFA) 108 (90 Base) MCG/ACT inhaler Inhale 2 puffs into the lungs every 6 (six) hours as needed for wheezing or shortness of breath.   amLODipine (NORVASC) 2.5 MG tablet TAKE 2 TABLETS BY MOUTH IN THE MORNING AND 1 TABLET IN THE EVENING, ONLY IF BLOOD PRESSURE IS ABOVE 120/80   apixaban (ELIQUIS) 5 MG TABS tablet TAKE 1 TABLET(5 MG) BY MOUTH TWICE DAILY   Calcium-Magnesium-Vitamin D (CALCIUM 1200+D3 PO) Take 1 tablet by mouth 2 (two) times a week. 1200 mg calcium / 1000 units Vitamin D3   Cholecalciferol (VITAMIN D) 50 MCG (2000 UT) CAPS Take 1 capsule by mouth daily at 12 noon.   cloNIDine (CATAPRES) 0.1 MG tablet as needed.   diltiazem (CARDIZEM) 30 MG tablet Take 1 tablet (30 mg total) by mouth every 6 (six) hours as needed (for tachycardia/recurrent Afib.).   promethazine (PHENERGAN) 12.5 MG tablet Take 0.5 tablets (6.25 mg total) by mouth every 6 (six) hours as needed for up to 5 days for nausea or vomiting.   rosuvastatin (CRESTOR) 10 MG tablet Take 1 tablet (10 mg total) by mouth daily.   No facility-administered encounter medications on file as of 04/20/2023.

## 2023-04-20 NOTE — Patient Instructions (Signed)
Thank you for allowing the Care Management team to participate in your care. It was great speaking with you today!   Reminders:

## 2023-05-03 ENCOUNTER — Other Ambulatory Visit: Payer: Self-pay | Admitting: Family Medicine

## 2023-05-03 DIAGNOSIS — J4520 Mild intermittent asthma, uncomplicated: Secondary | ICD-10-CM

## 2023-05-03 NOTE — Telephone Encounter (Signed)
Medication Refill - Medication: albuterol (VENTOLIN HFA) 108 (90 Base) MCG/ACT inhaler   Has the patient contacted their pharmacy? Yes.    Preferred Pharmacy (with phone number or street name):  [facility] - [place], [place] - [address] Phone: [phone]  Fax: [phone]     Has the patient been seen for an appointment in the last year OR does the patient have an upcoming appointment? Yes.    Agent: Please be advised that RX refills may take up to 3 business days. We ask that you follow-up with your pharmacy.

## 2023-05-04 MED ORDER — ALBUTEROL SULFATE HFA 108 (90 BASE) MCG/ACT IN AERS: 2.0000 | INHALATION_SPRAY | Freq: Four times a day (QID) | RESPIRATORY_TRACT | 0 refills | Status: AC | PRN

## 2023-05-04 NOTE — Telephone Encounter (Signed)
Requested Prescriptions  Pending Prescriptions Disp Refills   albuterol (VENTOLIN HFA) 108 (90 Base) MCG/ACT inhaler 6.7 g 0    Sig: Inhale 2 puffs into the lungs every 6 (six) hours as needed for wheezing or shortness of breath.     Pulmonology:  Beta Agonists 2 Passed - 05/03/2023  4:25 PM      Passed - Last BP in normal range    BP Readings from Last 1 Encounters:  01/24/23 122/74         Passed - Last Heart Rate in normal range    Pulse Readings from Last 1 Encounters:  01/24/23 86         Passed - Valid encounter within last 12 months    Recent Outpatient Visits           3 months ago PAF (paroxysmal atrial fibrillation) [facility])   [place] [facility] Alba Cory, MD   7 months ago Atherosclerosis of aorta [facility])   [facility] Alba Cory, MD   11 months ago Paroxysmal atrial fibrillation [facility])   [place] [facility] Alba Cory, MD   1 year ago Occipital neuralgia of right side   [facility] Alba Cory, MD   1 year ago Aneurysm [facility])   [facility] Alba Cory, MD       Future Appointments             In 3 weeks Alba Cory, MD [facility]

## 2023-05-06 ENCOUNTER — Ambulatory Visit (INDEPENDENT_AMBULATORY_CARE_PROVIDER_SITE_OTHER): Payer: Medicare Other

## 2023-05-06 VITALS — Ht 63.0 in | Wt 175.0 lb

## 2023-05-06 DIAGNOSIS — Z0000 Encounter for general adult medical examination without abnormal findings: Secondary | ICD-10-CM

## 2023-05-06 NOTE — Patient Instructions (Signed)
[name] , Thank you for taking time to come for your Medicare Wellness Visit. I appreciate your ongoing commitment to your health goals. Please review the following plan we discussed and let me know if I can assist you in the future.   Referrals/Orders/Follow-Ups/Clinician Recommendations: none  This is a list of the screening recommended for you and due dates:  Health Maintenance  Topic Date Due   Flu Shot  01/27/2023   COVID-19 Vaccine (7 - 2023-24 season) 02/27/2023   Mammogram  04/04/2024   Medicare Annual Wellness Visit  05/05/2024   Colon Cancer Screening  04/01/2025   DTaP/Tdap/Td vaccine (3 - Td or Tdap) 02/04/2031   Pneumonia Vaccine  Completed   DEXA scan (bone density measurement)  Completed   Zoster (Shingles) Vaccine  Completed   HPV Vaccine  Aged Out   Hepatitis C Screening  Discontinued    Advanced directives: (Copy Requested) Please bring a copy of your health care power of attorney and living will to the office to be added to your chart at your convenience.  Next Medicare Annual Wellness Visit scheduled for next year: Yes 05/17/24 @ 8:10am telephone

## 2023-05-06 NOTE — Progress Notes (Signed)
Subjective:   [name] is a 82 y.o. female who presents for Medicare Annual (Subsequent) preventive examination.  Visit Complete: Virtual I connected with  [name] on 05/06/23 by a audio enabled telemedicine application and verified that I am speaking with the correct person using two identifiers.  Patient Location: Home  Provider Location: Office/Clinic  I discussed the limitations of evaluation and management by telemedicine. The patient expressed understanding and agreed to proceed.  Vital Signs: Because this visit was a virtual/telehealth visit, some criteria may be missing or patient reported. Any vitals not documented were not able to be obtained and vitals that have been documented are patient reported.  Patient Medicare AWV questionnaire was completed by the patient on (not done); I have confirmed that all information answered by patient is correct and no changes since this date.  Cardiac Risk Factors include: advanced age (>75men, >61 women);dyslipidemia;hypertension;obesity (BMI >30kg/m2)     Objective:    Today's Vitals   05/06/23 0817  Weight: 175 lb (79.4 kg)  Height: 5\' 3"  (1.6 m)   Body mass index is 31 kg/m.     05/06/2023    8:31 AM 05/13/2022    9:40 AM 04/09/2022   10:54 AM 08/14/2021   10:30 AM 07/31/2021   11:36 AM 05/05/2021    2:42 PM 03/29/2021    9:19 AM  Advanced Directives  Does Patient Have a Medical Advance Directive? Yes Yes No Yes Yes No No  Type of Estate agent of [place];Living will Healthcare Power of [place];Living will  Healthcare Power of [employer] Power of Attorney    Does patient want to make changes to medical advance directive?  No - Patient declined  No - Patient declined No - Patient declined    Copy of Healthcare Power of Attorney in Chart? No - copy requested   No - copy requested No - copy requested    Would patient like information on creating a medical advance directive?   No -  Patient declined        Current Medications (verified) Outpatient Encounter Medications as of 05/06/2023  Medication Sig   acetaminophen (TYLENOL) 650 MG CR tablet Take 650 mg by mouth every 8 (eight) hours as needed for pain.   albuterol (VENTOLIN HFA) 108 (90 Base) MCG/ACT inhaler Inhale 2 puffs into the lungs every 6 (six) hours as needed for wheezing or shortness of breath.   amLODipine (NORVASC) 2.5 MG tablet TAKE 2 TABLETS BY MOUTH IN THE MORNING AND 1 TABLET IN THE EVENING, ONLY IF BLOOD PRESSURE IS ABOVE 120/80   apixaban (ELIQUIS) 5 MG TABS tablet TAKE 1 TABLET(5 MG) BY MOUTH TWICE DAILY   Calcium-Magnesium-Vitamin D (CALCIUM 1200+D3 PO) Take 1 tablet by mouth 2 (two) times a week. 1200 mg calcium / 1000 units Vitamin D3   Cholecalciferol (VITAMIN D) 50 MCG (2000 UT) CAPS Take 1 capsule by mouth daily at 12 noon.   cloNIDine (CATAPRES) 0.1 MG tablet as needed.   diltiazem (CARDIZEM) 30 MG tablet Take 1 tablet (30 mg total) by mouth every 6 (six) hours as needed (for tachycardia/recurrent Afib.).   promethazine (PHENERGAN) 12.5 MG tablet Take 0.5 tablets (6.25 mg total) by mouth every 6 (six) hours as needed for up to 5 days for nausea or vomiting.   rosuvastatin (CRESTOR) 10 MG tablet Take 1 tablet (10 mg total) by mouth daily.   No facility-administered encounter medications on file as of 05/06/2023.    Allergies (verified)  Ace inhibitors and Penicillins   History: Past Medical History:  Diagnosis Date   History of stress test    a. 02/2013 Nl stress test.   Hypertension    Insomnia, unspecified    Mitral regurgitation    a. echo 01/2016: nl LV sys fxn, mild MR, w/o pulm htn, nl atrial size   Osteoporosis, unspecified    Paroxysmal atrial fibrillation (HCC) 01/2016   a. diagnosed 01/2016; b. has been on eliquis, pradaxa, and xarelto at varying times 2/2 cost of each medication-->currently on eliquis (11/2016)   Pure hypercholesterolemia    Sleep apnea    Unable to tolerate  nocturnal PAP therapy   TIA (transient ischemic attack)    Unspecified asthma(493.90)    Vertigo    Past Surgical History:  Procedure Laterality Date   ABDOMINAL HYSTERECTOMY     CATARACT EXTRACTION Bilateral 08/31/2016   COLONOSCOPY WITH PROPOFOL N/A 03/04/2015   Procedure: COLONOSCOPY WITH PROPOFOL;  Surgeon: Midge Minium, MD;  Location: [facility] ENDOSCOPY;  Service: Endoscopy;  Laterality: N/A;   COLONOSCOPY WITH PROPOFOL N/A 04/01/2020   Procedure: COLONOSCOPY WITH PROPOFOL;  Surgeon: Midge Minium, MD;  Location: [facility] ENDOSCOPY;  Service: Endoscopy;  Laterality: N/A;   DIAGNOSTIC LAPAROSCOPY     REMOVAL OF SCAR TISSUE (ADHESIONS)   KNEE ARTHROPLASTY Right 08/14/2021   Procedure: COMPUTER ASSISTED TOTAL KNEE ARTHROPLASTY - RNFA;  Surgeon: Donato Heinz, MD;  Location: [facility] ORS;  Service: Orthopedics;  Laterality: Right;   KNEE ARTHROSCOPY Right 10/08/2020   Procedure: ARTHROSCOPY KNEE;  Surgeon: Donato Heinz, MD;  Location: [facility] ORS;  Service: Orthopedics;  Laterality: Right;   KNEE ARTHROSCOPY Right 10/21/2020   Procedure: Arthroscopic irrigation and debridement right knee;  Surgeon: Donato Heinz, MD;  Location: [facility] ORS;  Service: Orthopedics;  Laterality: Right;   Family History  Problem Relation Age of Onset   COPD Father    Hypertension Mother    Aneurysm Mother    Hypertension Brother    Stroke Brother    Cancer Brother        pancreatic   Healthy Brother    Social History   Socioeconomic History   Marital status: Single    Spouse name: Not on file   Number of children: 2   Years of education: Not on file   Highest education level: Associate degree: academic program  Occupational History   Occupation: Retired  Tobacco Use   Smoking status: Never   Smokeless tobacco: Never   Tobacco comments:    smoking cessation materials not required  Vaping Use   Vaping status: Never Used  Substance and Sexual Activity   Alcohol use: No    Alcohol/week: 0.0 standard  drinks of alcohol   Drug use: No   Sexual activity: Not Currently    Partners: Male  Other Topics Concern   Not on file  Social History Narrative   Independent at baseline. Lives alone.    Social Determinants of Health   Financial Resource Strain: Low Risk  (05/06/2023)   Overall Financial Resource Strain (CARDIA)    Difficulty of Paying Living Expenses: Not hard at all  Food Insecurity: No Food Insecurity (05/06/2023)   Hunger Vital Sign    Worried About Running Out of Food in the Last Year: Never true    Ran Out of Food in the Last Year: Never true  Transportation Needs: No Transportation Needs (05/06/2023)   PRAPARE - Administrator, Civil Service (Medical): No  Lack of Transportation (Non-Medical): No  Physical Activity: Insufficiently Active (05/06/2023)   Exercise Vital Sign    Days of Exercise per Week: 3 days    Minutes of Exercise per Session: 30 min  Stress: No Stress Concern Present (05/06/2023)   [employer] of Occupational Health - Occupational Stress Questionnaire    Feeling of Stress : Not at all  Social Connections: Moderately Integrated (05/06/2023)   Social Connection and Isolation Panel [NHANES]    Frequency of Communication with Friends and Family: More than three times a week    Frequency of Social Gatherings with Friends and Family: More than three times a week    Attends Religious Services: More than 4 times per year    Active Member of [employer] or Organizations: Yes    Attends Banker Meetings: Never    Marital Status: Divorced    Tobacco Counseling Counseling given: Not Answered Tobacco comments: smoking cessation materials not required   Clinical Intake:  Pre-visit preparation completed: No  Pain : No/denies pain     BMI - recorded: 31 Nutritional Status: BMI > 30  Obese Nutritional Risks: None Diabetes: No  How often do you need to have someone help you when you read instructions, pamphlets, or other written  materials from your doctor or pharmacy?: 1 - Never  Interpreter Needed?: No  Comments: lives alone Information entered by :: B.Cristobal Advani,LPN   Activities of Daily Living    05/06/2023    8:31 AM 01/24/2023    9:38 AM  In your present state of health, do you have any difficulty performing the following activities:  Hearing? 0 0  Vision? 0 0  Difficulty concentrating or making decisions? 0 0  Walking or climbing stairs? 0 0  Dressing or bathing? 0 0  Doing errands, shopping? 0 0  Preparing Food and eating ? N   Using the Toilet? N   In the past six months, have you accidently leaked urine? N   Do you have problems with loss of bowel control? N   Managing your Medications? N   Managing your Finances? N   Housekeeping or managing your Housekeeping? N     Patient Care Team: Alba Cory, MD as PCP - General (Family Medicine) Iran Ouch, MD as Consulting Physician (Cardiology) Lonell Face, MD as Consulting Physician (Neurology) Midge Minium, MD as Consulting Physician (Gastroenterology) Juanell Fairly, RN as Case Manager  Indicate any recent Medical Services you may have received from other than [facility] providers in the past year (date may be approximate).     Assessment:   This is a routine wellness examination for Malvika.  Hearing/Vision screen Hearing Screening - Comments:: Pt says her hearing is ok Vision Screening - Comments:: Pt says her vision is ok-[facility]   Goals Addressed             This Visit's Progress    DIET - INCREASE WATER INTAKE   Not on track    Recommend to drink at least 6-8 8oz glasses of water per day.     COMPLETED: Track and Manage My Blood Pressure-Hypertension   On track    Timeframe:  Long-Range Goal Priority:  High Start Date: 04/29/2021                             Expected End Date: 04/29/2022  Follow Up within 90 days   - check blood pressure weekly    Why is this important?   You won't feel  high blood pressure, but it can still hurt your blood vessels.  High blood pressure can cause heart or kidney problems. It can also cause a stroke.  Making lifestyle changes like losing a little weight or eating less salt will help.  Checking your blood pressure at home and at different times of the day can help to control blood pressure.  If the doctor prescribes medicine remember to take it the way the doctor ordered.  Call the office if you cannot afford the medicine or if there are questions about it.     Notes:        Depression Screen    05/06/2023    8:28 AM 04/20/2023    4:27 PM 03/21/2023    1:45 PM 01/24/2023    9:38 AM 05/24/2022   10:28 AM 04/16/2022    7:57 AM 02/04/2022    8:23 AM  PHQ 2/9 Scores  PHQ - 2 Score 0 0 0 0 1 0 0  PHQ- 9 Score    0 3 2 0    Fall Risk    05/06/2023    8:21 AM 03/21/2023    1:57 PM 01/24/2023    9:38 AM 09/22/2022    2:32 PM 05/24/2022   10:28 AM  Fall Risk   Falls in the past year? 0 0 0 0 0  Number falls in past yr: 0 0 0    Injury with Fall? 0  0    Risk for fall due to : No Fall Risks Impaired balance/gait;Medication side effect No Fall Risks No Fall Risks   Follow up Education provided;Falls prevention discussed Falls prevention discussed Falls prevention discussed Falls prevention discussed;Education provided;Falls evaluation completed Falls prevention discussed;Education provided;Falls evaluation completed    MEDICARE RISK AT HOME: Medicare Risk at Home Any stairs in or around the home?: No If so, are there any without handrails?: No Home free of loose throw rugs in walkways, pet beds, electrical cords, etc?: Yes Adequate lighting in your home to reduce risk of falls?: Yes Life alert?: No Use of a cane, walker or w/c?: No Grab bars in the bathroom?: Yes Shower chair or bench in shower?: Yes Elevated toilet seat or a handicapped toilet?: No  TIMED UP AND GO:  Was the test performed?  No    Cognitive Function:         05/06/2023    8:32 AM 02/04/2022    8:26 AM 01/30/2019    9:18 AM 01/24/2018    9:36 AM  6CIT Screen  What Year? 0 points 0 points 0 points 0 points  What month? 0 points 0 points 0 points 0 points  What time? 0 points 0 points 0 points 0 points  Count back from 20 0 points 0 points 0 points 0 points  Months in reverse 0 points 0 points 0 points 0 points  Repeat phrase 0 points 0 points 0 points 2 points  Total Score 0 points 0 points 0 points 2 points    Immunizations Immunization History  Administered Date(s) Administered   Fluad Quad(high Dose 65+) 04/16/2019, 04/21/2020, 05/24/2022   Influenza, High Dose Seasonal PF 02/20/2015, 05/11/2017, 05/04/2018, 04/01/2021   Influenza,inj,quad, With Preservative 03/28/2020   Influenza-Unspecified 02/20/2016   Moderna Covid-19 Fall Seasonal Vaccine 11yrs & older 07/05/2022   PFIZER(Purple Top)SARS-COV-2 Vaccination 07/05/2019, 07/26/2019, 04/23/2020, 12/22/2020, 07/07/2021  Pneumococcal Conjugate-13 10/17/2017   Pneumococcal Polysaccharide-23 05/31/2019   Respiratory Syncytial Virus Vaccine,Recomb Aduvanted(Arexvy) 07/05/2022   Tdap 02/22/2012, 02/03/2021   Zoster Recombinant(Shingrix) 12/04/2019, 02/08/2020    TDAP status: Up to date  Flu Vaccine status: Due, Education has been provided regarding the importance of this vaccine. Advised may receive this vaccine at local pharmacy or Health Dept. Aware to provide a copy of the vaccination record if obtained from local pharmacy or Health Dept. Verbalized acceptance and understanding.  Pneumococcal vaccine status: Up to date  Covid-19 vaccine status: Completed vaccines  Qualifies for Shingles Vaccine? Yes   Zostavax completed Yes   Shingrix Completed?: Yes  Screening Tests Health Maintenance  Topic Date Due   INFLUENZA VACCINE  01/27/2023   COVID-19 Vaccine (7 - 2023-24 season) 02/27/2023   MAMMOGRAM  04/04/2024   Medicare Annual Wellness (AWV)  05/05/2024   Colonoscopy  04/01/2025    DTaP/Tdap/Td (3 - Td or Tdap) 02/04/2031   Pneumonia Vaccine 13+ Years old  Completed   DEXA SCAN  Completed   Zoster Vaccines- Shingrix  Completed   HPV VACCINES  Aged Out   Hepatitis C Screening  Discontinued    Health Maintenance  Health Maintenance Due  Topic Date Due   INFLUENZA VACCINE  01/27/2023   COVID-19 Vaccine (7 - 2023-24 season) 02/27/2023    Colorectal cancer screening: No longer required.   Mammogram status: No longer required due to age.  Bone Density status: Completed 03/19/2021. Results reflect: Bone density results: NORMAL. Repeat every 5 years.  Lung Cancer Screening: (Low Dose CT Chest recommended if Age 4-80 years, 20 pack-year currently smoking OR have quit w/in 15years.) does not qualify.   Lung Cancer Screening Referral: no  Additional Screening:  Hepatitis C Screening: does not qualify; Completed 02/22/20  Vision Screening: Recommended annual ophthalmology exams for early detection of glaucoma and other disorders of the eye. Is the patient up to date with their annual eye exam?  Yes  Who is the provider or what is the name of the office in which the patient attends annual eye exams? Dr Santiago Bumpers If pt is not established with a provider, would they like to be referred to a provider to establish care? No .   Dental Screening: Recommended annual dental exams for proper oral hygiene  Diabetic Foot Exam: n/a  Community Resource Referral / Chronic Care Management: CRR required this visit?  No   CCM required this visit?  No    Plan:     I have personally reviewed and noted the following in the patient's chart:   Medical and social history Use of alcohol, tobacco or illicit drugs  Current medications and supplements including opioid prescriptions. Patient is not currently taking opioid prescriptions. Functional ability and status Nutritional status Physical activity Advanced directives List of other physicians Hospitalizations, surgeries,  and ER visits in previous 12 months Vitals Screenings to include cognitive, depression, and falls Referrals and appointments  In addition, I have reviewed and discussed with patient certain preventive protocols, quality metrics, and best practice recommendations. A written personalized care plan for preventive services as well as general preventive health recommendations were provided to patient.    Sue Lush, LPN   28/09/1322   After Visit Summary: (MyChart) Due to this being a telephonic visit, the after visit summary with patients personalized plan was offered to patient via MyChart   Nurse Notes: The patient states she is doing well and has no concerns or questions at this time.

## 2023-05-18 ENCOUNTER — Other Ambulatory Visit: Payer: Self-pay

## 2023-05-18 NOTE — Patient Outreach (Signed)
  Care Management   Visit Note  05/18/2023 Name: [name] MRN: [id number] DOB: [birth year]  Subjective: [name] is a 82 y.o. year old female who is a primary care patient of Alba Cory, MD. The Care Management team was consulted for assistance.      Engaged with Ms. Kopec via telephone.  Assessment:  Outpatient Encounter Medications as of 05/18/2023  Medication Sig Note   acetaminophen (TYLENOL) 650 MG CR tablet Take 650 mg by mouth every 8 (eight) hours as needed for pain.    albuterol (VENTOLIN HFA) 108 (90 Base) MCG/ACT inhaler Inhale 2 puffs into the lungs every 6 (six) hours as needed for wheezing or shortness of breath.    amLODipine (NORVASC) 2.5 MG tablet TAKE 2 TABLETS BY MOUTH IN THE MORNING AND 1 TABLET IN THE EVENING, ONLY IF BLOOD PRESSURE IS ABOVE 120/80    apixaban (ELIQUIS) 5 MG TABS tablet TAKE 1 TABLET(5 MG) BY MOUTH TWICE DAILY    Calcium-Magnesium-Vitamin D (CALCIUM 1200+D3 PO) Take 1 tablet by mouth 2 (two) times a week. 1200 mg calcium / 1000 units Vitamin D3    Cholecalciferol (VITAMIN D) 50 MCG (2000 UT) CAPS Take 1 capsule by mouth daily at 12 noon.    cloNIDine (CATAPRES) 0.1 MG tablet as needed.    rosuvastatin (CRESTOR) 10 MG tablet Take 1 tablet (10 mg total) by mouth daily.    diltiazem (CARDIZEM) 30 MG tablet Take 1 tablet (30 mg total) by mouth every 6 (six) hours as needed (for tachycardia/recurrent Afib.).    promethazine (PHENERGAN) 12.5 MG tablet Take 0.5 tablets (6.25 mg total) by mouth every 6 (six) hours as needed for up to 5 days for nausea or vomiting. 05/06/2023: Prn for vertigo   No facility-administered encounter medications on file as of 05/18/2023.

## 2023-05-18 NOTE — Patient Instructions (Signed)
Thank you for allowing the Care Management team to participate in your care. It was great speaking with you today!   Reminders:

## 2023-05-25 NOTE — Progress Notes (Signed)
Name: [name]   MRN: [id number]    DOB: 1940/11/02   Date:05/30/2023       Progress Note  Subjective  Chief Complaint  Chief Complaint  Patient presents with   Medical Management of Chronic Issues    HPI  Discussed the use of AI scribe software for clinical note transcription with the patient, who gave verbal consent to proceed.  History of Present Illness   The patient, with a history of hypertension, paroxysmal atrial fibrillation, and possible chronic kidney disease, presented for a routine follow-up. She reported no new health issues since the last visit. The patient's blood pressure was noted to be on the lower end of normal, and she reported occasionally skipping her evening dose of amlodipine due to this. She also reported occasional episodes of vertigo, particularly upon changing positions, but these episodes were not frequent and were managed with promethazine as needed.  The patient has a history of a transient ischemic attack (TIA) in 2019, but reported no recent symptoms suggestive of a recurrence. She is on Eliquis for atrial fibrillation and has been prescribed diltiazem to take as needed for palpitations, which she reported have not been frequent.  The patient also has a history of osteoarthritis and has had a right knee replacement. She reported occasional pain in the left knee, but it was not severe and did not limit her activities. She also reported a history of low back pain with radiculitis, but this is currently under control.  The patient has a history of prediabetes, but her most recent HbA1c was within the normal range. She also has a history of asthma, but reported no recent symptoms.   She has been diagnosed with an aneurysm of left ICA, which is being monitored, and reported no symptoms related to this. She takes statin therapy and Eliquis and is monitored by Dr. Sherryll Burger - neurologist   The patient has a history of obstructive sleep apnea but was unable to  tolerate CPAP. She reported trying to manage this by raising the head of the bed and avoiding sleeping on her back. She also reported easy bruising, which was attributed to senile purpura.  The patient's medications include amlodipine, Eliquis, rosuvastatin, and promethazine. She reported taking her medications as prescribed, with the exception of occasionally skipping the evening dose of amlodipine due to low blood pressure readings. She reported no side effects from her medications.  The patient has been maintaining her weight and reported consuming a protein supplement daily. She reported no changes in her appetite or dietary habits. She also reported receiving her annual flu vaccine and was up to date on her other immunizations.         Patient Active Problem List   Diagnosis Date Noted   Senile purpura (HCC) 05/24/2022   Benign paroxysmal positional vertigo 05/24/2022   Thrombocytosis 11/19/2021   Mixed hyperlipidemia 11/19/2021   Atherosclerosis of aorta (HCC) 11/19/2021   Mild protein-calorie malnutrition (HCC) 11/19/2021   OSA (obstructive sleep apnea) 11/19/2021   Pre-diabetes 11/19/2021   Lumbar stenosis with neurogenic claudication 11/19/2021   Vitamin D deficiency 11/19/2021   History of total right knee replacement 08/14/2021   Osteoarthritis of right knee 05/05/2020   History of colonic polyps    Polyp of transverse colon    Aneurysm (HCC) 02/08/2018   History of CVA (cerebrovascular accident) without residual deficits 01/24/2018   Aneurysm of ophthalmic artery 01/24/2018   Impingement syndrome of shoulder region 01/02/2018   History of cataract surgery,  right 09/01/2016   Paroxysmal atrial fibrillation (HCC) 02/24/2016   Vertigo 02/24/2016   Low HDL (under 40) 02/17/2016   Hx of colonic polyps    Benign neoplasm of sigmoid colon    Mild intermittent asthma without complication 02/21/2015   Hyperglycemia 02/21/2015   Essential hypertension 02/20/2015   Inconclusive  mammogram due to dense breasts 01/02/2015   Dense breast 01/02/2015   Abnormal EKG 02/23/2013    Past Surgical History:  Procedure Laterality Date   ABDOMINAL HYSTERECTOMY     CATARACT EXTRACTION Bilateral 08/31/2016   COLONOSCOPY WITH PROPOFOL N/A 03/04/2015   Procedure: COLONOSCOPY WITH PROPOFOL;  Surgeon: Midge Minium, MD;  Location: [facility] ENDOSCOPY;  Service: Endoscopy;  Laterality: N/A;   COLONOSCOPY WITH PROPOFOL N/A 04/01/2020   Procedure: COLONOSCOPY WITH PROPOFOL;  Surgeon: Midge Minium, MD;  Location: [facility] ENDOSCOPY;  Service: Endoscopy;  Laterality: N/A;   DIAGNOSTIC LAPAROSCOPY     REMOVAL OF SCAR TISSUE (ADHESIONS)   KNEE ARTHROPLASTY Right 08/14/2021   Procedure: COMPUTER ASSISTED TOTAL KNEE ARTHROPLASTY - RNFA;  Surgeon: Donato Heinz, MD;  Location: [facility] ORS;  Service: Orthopedics;  Laterality: Right;   KNEE ARTHROSCOPY Right 10/08/2020   Procedure: ARTHROSCOPY KNEE;  Surgeon: Donato Heinz, MD;  Location: [facility] ORS;  Service: Orthopedics;  Laterality: Right;   KNEE ARTHROSCOPY Right 10/21/2020   Procedure: Arthroscopic irrigation and debridement right knee;  Surgeon: Donato Heinz, MD;  Location: [facility] ORS;  Service: Orthopedics;  Laterality: Right;    Family History  Problem Relation Age of Onset   COPD Father    Hypertension Mother    Aneurysm Mother    Hypertension Brother    Stroke Brother    Cancer Brother        pancreatic   Healthy Brother     Social History   Tobacco Use   Smoking status: Never   Smokeless tobacco: Never   Tobacco comments:    smoking cessation materials not required  Substance Use Topics   Alcohol use: No    Alcohol/week: 0.0 standard drinks of alcohol     Current Outpatient Medications:    acetaminophen (TYLENOL) 650 MG CR tablet, Take 650 mg by mouth every 8 (eight) hours as needed for pain., Disp: , Rfl:    albuterol (VENTOLIN HFA) 108 (90 Base) MCG/ACT inhaler, Inhale 2 puffs into the lungs every 6 (six) hours as needed  for wheezing or shortness of breath., Disp: 6.7 g, Rfl: 0   amLODipine (NORVASC) 2.5 MG tablet, TAKE 2 TABLETS BY MOUTH IN THE MORNING AND 1 TABLET IN THE EVENING, ONLY IF BLOOD PRESSURE IS ABOVE 120/80, Disp: 270 tablet, Rfl: 1   apixaban (ELIQUIS) 5 MG TABS tablet, TAKE 1 TABLET(5 MG) BY MOUTH TWICE DAILY, Disp: 60 tablet, Rfl: 5   Calcium-Magnesium-Vitamin D (CALCIUM 1200+D3 PO), Take 1 tablet by mouth 2 (two) times a week. 1200 mg calcium / 1000 units Vitamin D3, Disp: , Rfl:    Cholecalciferol (VITAMIN D) 50 MCG (2000 UT) CAPS, Take 1 capsule by mouth daily at 12 noon., Disp: , Rfl:    cloNIDine (CATAPRES) 0.1 MG tablet, as needed., Disp: , Rfl:    diltiazem (CARDIZEM) 30 MG tablet, Take 1 tablet (30 mg total) by mouth every 6 (six) hours as needed (for tachycardia/recurrent Afib.)., Disp: 30 tablet, Rfl: 3   promethazine (PHENERGAN) 12.5 MG tablet, Take 0.5 tablets (6.25 mg total) by mouth every 6 (six) hours as needed for up to 5 days for nausea or vomiting.,  Disp: 10 tablet, Rfl: 0   rosuvastatin (CRESTOR) 10 MG tablet, Take 1 tablet (10 mg total) by mouth daily., Disp: 90 tablet, Rfl: 1  Allergies  Allergen Reactions   Ace Inhibitors Swelling    Patient states her tongue swell.   Penicillins Anxiety    IgE = 251 (WNL) on 07/31/2021  Has patient had a PCN reaction causing immediate rash, facial/tongue/throat swelling, SOB or lightheadedness with hypotension: No Has patient had a PCN reaction causing severe rash involving mucus membranes or skin necrosis: No Has patient had a PCN reaction that required hospitalization No Has patient had a PCN reaction occurring within the last 10 years: No If all of the above answers are "NO", then may proceed with Cephalosporin use.    I personally reviewed active problem list, medication list, allergies, family history, social history with the patient/caregiver today.   ROS  Ten systems reviewed and is negative except as mentioned in HPI     Objective  Vitals:   05/30/23 0914  BP: 114/78  Pulse: 92  Resp: 16  Temp: 97.7 F (36.5 C)  TempSrc: Oral  SpO2: 95%  Weight: 172 lb 12.8 oz (78.4 kg)  Height: 5\' 3"  (1.6 m)    Body mass index is 30.61 kg/m.  Physical Exam  Constitutional: Patient appears well-developed and well-nourished. Obese  No distress.  HEENT: head atraumatic, normocephalic, pupils equal and reactive to light, neck supple Cardiovascular: Normal rate, regular rhythm and normal heart sounds.  No murmur heard. No BLE edema. Pulmonary/Chest: Effort normal and breath sounds normal. No respiratory distress. Abdominal: Soft.  There is no tenderness. Psychiatric: Patient has a normal mood and affect. behavior is normal. Judgment and thought content normal.    PHQ2/9:    05/30/2023    9:18 AM 05/18/2023    4:55 PM 05/06/2023    8:28 AM 04/20/2023    4:27 PM 03/21/2023    1:45 PM  Depression screen PHQ 2/9  Decreased Interest 0 0 0 0 0  Down, Depressed, Hopeless 0 0 0 0 0  PHQ - 2 Score 0 0 0 0 0  Altered sleeping 0      Tired, decreased energy 0      Change in appetite 0      Feeling bad or failure about yourself  0      Trouble concentrating 0      Moving slowly or fidgety/restless 0      Suicidal thoughts 0      PHQ-9 Score 0      Difficult doing work/chores Not difficult at all        phq 9 is negative   Fall Risk:    05/30/2023    9:13 AM 05/06/2023    8:21 AM 03/21/2023    1:57 PM 01/24/2023    9:38 AM 09/22/2022    2:32 PM  Fall Risk   Falls in the past year? 0 0 0 0 0  Number falls in past yr: 0 0 0 0   Injury with Fall? 0 0  0   Risk for fall due to : No Fall Risks No Fall Risks Impaired balance/gait;Medication side effect No Fall Risks No Fall Risks  Follow up Falls prevention discussed;Education provided;Falls evaluation completed Education provided;Falls prevention discussed Falls prevention discussed Falls prevention discussed Falls prevention discussed;Education provided;Falls  evaluation completed     Functional Status Survey: Is the patient deaf or have difficulty hearing?: No Does the patient have difficulty seeing, even when  wearing glasses/contacts?: No Does the patient have difficulty concentrating, remembering, or making decisions?: No Does the patient have difficulty walking or climbing stairs?: No Does the patient have difficulty dressing or bathing?: No Does the patient have difficulty doing errands alone such as visiting a doctor's office or shopping?: No    Assessment & Plan  Assessment and Plan    Hypertension Blood pressure well controlled, possibly towards the low end of normal. Patient currently on Amlodipine 5mg  twice daily, but occasionally skips the evening dose. -Continue Amlodipine 5mg  in the morning. -Consider taking the second dose only if blood pressure spikes at night. -If blood pressure continues to be low, consider changing the blood pressure medication. -Encourage patient to monitor blood pressure at home and report readings.  Decrease in GFR  Previous GFR was slightly low at 57. -Repeat comprehensive panel today to check kidney function.  Hyperlipidemia Last cholesterol level check was in November of last year. -Repeat cholesterol level today.  Paroxysmal Atrial Fibrillation Patient is on Eliquis 5mg  twice daily and Diltiazem 30mg  as needed. No recent palpitations reported. -Continue current medication regimen.  Cerebral Aneurysm Stable 3mm left atomic ICA segment aneurysm. -Continue monitoring. -Advise patient to report any changes in vision.  Osteoarthritis of the Knee Right knee replacement in 2023. Patient reports occasional pain and locking in the left knee. -Consider another gel shot for the left knee if pain worsens.  Low Back Pain with Radiculitis Pain is currently controlled. Patient has previously received epidural injections from a physiatrist. -Advise patient to return to physiatrist for another  epidural if pain returns.  Prediabetes Last A1C was 5.7. -Encourage patient to continue current lifestyle modifications.  Asthma Intermittent symptoms, less than once a month. -Continue current management.  Senile Purpura Patient bruises easily, especially with bumps. -No specific intervention needed at this time.  General Health Maintenance -Received flu vaccine today. -Advise patient to receive the latest COVID-19 booster vaccine. -Plan to see patient back in six months.

## 2023-05-30 ENCOUNTER — Ambulatory Visit (INDEPENDENT_AMBULATORY_CARE_PROVIDER_SITE_OTHER): Payer: Medicare Other | Admitting: Family Medicine

## 2023-05-30 ENCOUNTER — Encounter: Payer: Self-pay | Admitting: Family Medicine

## 2023-05-30 VITALS — BP 114/78 | HR 92 | Temp 97.7°F | Resp 16 | Ht 63.0 in | Wt 172.8 lb

## 2023-05-30 DIAGNOSIS — G4733 Obstructive sleep apnea (adult) (pediatric): Secondary | ICD-10-CM | POA: Diagnosis not present

## 2023-05-30 DIAGNOSIS — I700 Atherosclerosis of aorta: Secondary | ICD-10-CM | POA: Diagnosis not present

## 2023-05-30 DIAGNOSIS — Z8673 Personal history of transient ischemic attack (TIA), and cerebral infarction without residual deficits: Secondary | ICD-10-CM | POA: Diagnosis not present

## 2023-05-30 DIAGNOSIS — I729 Aneurysm of unspecified site: Secondary | ICD-10-CM | POA: Diagnosis not present

## 2023-05-30 DIAGNOSIS — M5481 Occipital neuralgia: Secondary | ICD-10-CM | POA: Diagnosis not present

## 2023-05-30 DIAGNOSIS — D692 Other nonthrombocytopenic purpura: Secondary | ICD-10-CM | POA: Diagnosis not present

## 2023-05-30 DIAGNOSIS — M48062 Spinal stenosis, lumbar region with neurogenic claudication: Secondary | ICD-10-CM

## 2023-05-30 DIAGNOSIS — Z23 Encounter for immunization: Secondary | ICD-10-CM

## 2023-05-30 DIAGNOSIS — I10 Essential (primary) hypertension: Secondary | ICD-10-CM

## 2023-05-30 DIAGNOSIS — R944 Abnormal results of kidney function studies: Secondary | ICD-10-CM

## 2023-05-30 DIAGNOSIS — I480 Paroxysmal atrial fibrillation: Secondary | ICD-10-CM | POA: Diagnosis not present

## 2023-05-30 MED ORDER — ROSUVASTATIN CALCIUM 10 MG PO TABS: 10.0000 mg | ORAL_TABLET | Freq: Every day | ORAL | 1 refills | Status: DC

## 2023-05-30 MED ORDER — PROMETHAZINE HCL 12.5 MG PO TABS: 6.2500 mg | ORAL_TABLET | Freq: Four times a day (QID) | ORAL | 0 refills | Status: DC | PRN

## 2023-05-30 MED ORDER — AMLODIPINE BESYLATE 2.5 MG PO TABS: 2.5000 mg | ORAL_TABLET | Freq: Two times a day (BID) | ORAL | 1 refills | Status: DC

## 2023-05-31 ENCOUNTER — Ambulatory Visit: Payer: Self-pay | Admitting: *Deleted

## 2023-05-31 ENCOUNTER — Other Ambulatory Visit: Payer: Self-pay | Admitting: Family Medicine

## 2023-05-31 DIAGNOSIS — I10 Essential (primary) hypertension: Secondary | ICD-10-CM

## 2023-05-31 LAB — COMPLETE METABOLIC PANEL WITHOUT GFR
AG Ratio: 1.3 (calc) (ref 1.0–2.5)
ALT: 20 U/L (ref 6–29)
AST: 18 U/L (ref 10–35)
Albumin: 4.3 g/dL (ref 3.6–5.1)
Alkaline phosphatase (APISO): 66 U/L (ref 37–153)
BUN: 18 mg/dL (ref 7–25)
CO2: 27 mmol/L (ref 20–32)
Calcium: 10.2 mg/dL (ref 8.6–10.4)
Chloride: 106 mmol/L (ref 98–110)
Creat: 0.91 mg/dL (ref 0.60–0.95)
Globulin: 3.4 g/dL (ref 1.9–3.7)
Glucose, Bld: 103 mg/dL — ABNORMAL HIGH (ref 65–99)
Potassium: 4.7 mmol/L (ref 3.5–5.3)
Sodium: 141 mmol/L (ref 135–146)
Total Bilirubin: 0.4 mg/dL (ref 0.2–1.2)
Total Protein: 7.7 g/dL (ref 6.1–8.1)
eGFR: 64 mL/min/{1.73_m2}

## 2023-05-31 LAB — LIPID PANEL
Cholesterol: 116 mg/dL
HDL: 47 mg/dL — ABNORMAL LOW
LDL Cholesterol (Calc): 54 mg/dL
Non-HDL Cholesterol (Calc): 69 mg/dL
Total CHOL/HDL Ratio: 2.5 (calc)
Triglycerides: 74 mg/dL

## 2023-05-31 MED ORDER — AMLODIPINE BESYLATE 5 MG PO TABS: 5.0000 mg | ORAL_TABLET | Freq: Every day | ORAL | 0 refills | Status: DC

## 2023-05-31 NOTE — Telephone Encounter (Signed)
Per Note: Hypertension Blood pressure well controlled, possibly towards the low end of normal. Patient currently on Amlodipine 5mg  twice daily, but occasionally skips the evening dose. -Continue Amlodipine 5mg  in the morning. -Consider taking the second dose only if blood pressure spikes at night. -If blood pressure continues to be low, consider changing the blood pressure medication. -Encourage patient to monitor blood pressure at home and report readings  Rx sent yesterday 2.5 dose, is it suppose to be 5mg ?

## 2023-05-31 NOTE — Telephone Encounter (Signed)
  Chief Complaint: Dr. Carlynn Purl instructed pt to only take one amlodipine in the morning and not at night unless her BP spiked.   [facility] telling pt she is to take the medication both morning and night.    Her BP was low so Dr. Carlynn Purl told pt she was going to decrease the amlodipine to one pill a day in the morning.   Her notes indicate 5 mg every morning but the pills are 2.5 mg each so if she takes one pill she is not getting the correct dose.    Needing clarification with [facility] what dose Dr. Carlynn Purl wants pt to take. Symptoms: Amlodipine decreased due to BP being too low per chart and pt. Frequency: Seen yesterday by Dr. Carlynn Purl. Pertinent Negatives: Patient denies N/A Disposition: [] ED /[] Urgent Care (no appt availability in office) / [] Appointment(In office/virtual)/ []  [facility]/ [] Home Care/ [] Refused Recommended Disposition /[] [facility]/ [x]  Follow-up with PCP Additional Notes: Message sent to Dr. Carlynn Purl for clarification on the Amlodipine dose.   Also should it be 2.5 mg tablets or 5 mg tablets.   Send new rx to [facility] if needed.

## 2023-05-31 NOTE — Telephone Encounter (Signed)
Called pharmacy and spoke to Sam advised new rx was sent. Sam stated pt picked up the 2.5mg  dose and was told to only take 2 pills in the am and one 2.5 in the evening.  Called pt and advised per PCP note she should only be taking 5mg  in the morning and only in the evening if her bp spiked up. Pt was a little confused due to having 2.5 mg dose at home and not the 5 mg. I advised pt will be taking two of 2.5mg  dose(5mg ) in the morning and if bp spiked up in the evening/night it would be another two of 2.5mg  dose (5mg ) ONLY, otherwise would skip the evening dose if bp is good. Pt verbalized understanding, I advised she can come in if needs clarification.

## 2023-05-31 NOTE — Telephone Encounter (Signed)
Reason for Disposition  [1] Caller has URGENT medicine question about med that PCP or specialist prescribed AND [2] triager unable to answer question    Amlodipine clarification with [facility] needed  Answer Assessment - Initial Assessment Questions 1. NAME of MEDICINE: "What medicine(s) are you calling about?"     Yesterday my BP was 113 (114/76 per chart) not know if top or bottom number. I was taking my BP pill with Eliquis in the morning and at night.   My BP was low.   Dr. Carlynn Purl said to stop one of the BP medicines because my BP was low. [facility] told me it's increased to one in morning and 2 at night.   That's not what Dr. Carlynn Purl told me.   She said to take one in the morning only.  2. QUESTION: "What is your question?" (e.g., double dose of medicine, side effect)     I need clarification on amlodipine dose.  On med list it's listed as 2.5 mg per tablet.  Dr. Carlynn Purl' notes indicate she is to take 5 mg only in the morning and only consider taking another pill if her BP spikes.  Message being sent to Dr. Carlynn Purl for clarification.  3. PRESCRIBER: "Who prescribed the medicine?" Reason: if prescribed by specialist, call should be referred to that group.     [place] 4. SYMPTOMS: "Do you have any symptoms?" If Yes, ask: "What symptoms are you having?"  "How bad are the symptoms (e.g., mild, moderate, severe)     N/A 5. PREGNANCY:  "Is there any chance that you are pregnant?" "When was your last menstrual period?"     N/A  Protocols used: Medication Question Call-A-AH

## 2023-06-09 ENCOUNTER — Ambulatory Visit: Payer: Medicare Other

## 2023-06-09 DIAGNOSIS — I10 Essential (primary) hypertension: Secondary | ICD-10-CM

## 2023-06-09 NOTE — Progress Notes (Signed)
Patient is in office today for a nurse visit for Blood Pressure Check. Patient blood pressure was 132/80, Patient No chest pain, No shortness of breath, No dyspnea on exertion, No orthopnea, No paroxysmal nocturnal dyspnea, No edema, No palpitations, No syncope.  Patient came in today for herself states when checking at home blood pressure high, she bought her automatic cuff with her today.  Cuff reading was 150/76.  She also had book of readings most within normal ranges few were reading high in 150-160's/70-80's.  Told patient to keep checking I would notify provider for any changes, but if it consistently stays high to notify us.

## 2023-06-30 ENCOUNTER — Other Ambulatory Visit: Payer: Self-pay

## 2023-07-01 ENCOUNTER — Other Ambulatory Visit: Payer: Self-pay

## 2023-07-04 NOTE — Patient Outreach (Signed)
  Care Management   Visit Note  07/04/2023 Name: [name] MRN: [id number] DOB: [birth year]  Subjective: [name] is a 83 y.o. year old female who is a primary care patient of Sowles, Krichna, MD. The Care Management team was consulted for assistance.      Engaged with [name] via telephone  Assessment:  Review of patient past medical history, allergies, medications, health status, including review of consultants reports, laboratory and other test data, was performed as part of  evaluation and provision of care management services.    Outpatient Encounter Medications as of 06/30/2023  Medication Sig   acetaminophen  (TYLENOL ) 650 MG CR tablet Take 650 mg by mouth every 8 (eight) hours as needed for pain.   albuterol  (VENTOLIN  HFA) 108 (90 Base) MCG/ACT inhaler Inhale 2 puffs into the lungs every 6 (six) hours as needed for wheezing or shortness of breath.   amLODipine  (NORVASC ) 5 MG tablet Take 1 tablet (5 mg total) by mouth daily.   apixaban  (ELIQUIS ) 5 MG TABS tablet TAKE 1 TABLET(5 MG) BY MOUTH TWICE DAILY   Calcium -Magnesium -Vitamin D  (CALCIUM  1200+D3 PO) Take 1 tablet by mouth 2 (two) times a week. 1200 mg calcium  / 1000 units Vitamin D3   Cholecalciferol (VITAMIN D ) 50 MCG (2000 UT) CAPS Take 1 capsule by mouth daily at 12 noon.   diltiazem  (CARDIZEM ) 30 MG tablet Take 1 tablet (30 mg total) by mouth every 6 (six) hours as needed (for tachycardia/recurrent Afib.).   promethazine  (PHENERGAN ) 12.5 MG tablet Take 0.5 tablets (6.25 mg total) by mouth every 6 (six) hours as needed for up to 5 days for nausea or vomiting.   rosuvastatin  (CRESTOR ) 10 MG tablet Take 1 tablet (10 mg total) by mouth daily.   No facility-administered encounter medications on file as of 06/30/2023.

## 2023-07-12 DIAGNOSIS — M1712 Unilateral primary osteoarthritis, left knee: Secondary | ICD-10-CM | POA: Diagnosis not present

## 2023-07-18 NOTE — Patient Outreach (Signed)
  Care Management   Visit Note  07/18/2023 Name: [name] MRN: [id number] DOB: 05-Jul-1940  Subjective: [name] is a 83 y.o. year old female who is a primary care patient of Alba Cory, MD. The Care Management team was consulted for assistance.      Engaged with [name] via telephone.  Assessment:  Review of patient past medical history, allergies, medications, health status, including review of consultants reports, laboratory and other test data, was performed as part of  evaluation and provision of care management services.    Outpatient Encounter Medications as of 07/01/2023  Medication Sig   acetaminophen (TYLENOL) 650 MG CR tablet Take 650 mg by mouth every 8 (eight) hours as needed for pain.   albuterol (VENTOLIN HFA) 108 (90 Base) MCG/ACT inhaler Inhale 2 puffs into the lungs every 6 (six) hours as needed for wheezing or shortness of breath.   amLODipine (NORVASC) 5 MG tablet Take 1 tablet (5 mg total) by mouth daily.   apixaban (ELIQUIS) 5 MG TABS tablet TAKE 1 TABLET(5 MG) BY MOUTH TWICE DAILY   Calcium-Magnesium-Vitamin D (CALCIUM 1200+D3 PO) Take 1 tablet by mouth 2 (two) times a week. 1200 mg calcium / 1000 units Vitamin D3   Cholecalciferol (VITAMIN D) 50 MCG (2000 UT) CAPS Take 1 capsule by mouth daily at 12 noon.   diltiazem (CARDIZEM) 30 MG tablet Take 1 tablet (30 mg total) by mouth every 6 (six) hours as needed (for tachycardia/recurrent Afib.).   rosuvastatin (CRESTOR) 10 MG tablet Take 1 tablet (10 mg total) by mouth daily.   No facility-administered encounter medications on file as of 07/01/2023.

## 2023-07-19 DIAGNOSIS — M1712 Unilateral primary osteoarthritis, left knee: Secondary | ICD-10-CM | POA: Diagnosis not present

## 2023-07-26 DIAGNOSIS — M1712 Unilateral primary osteoarthritis, left knee: Secondary | ICD-10-CM | POA: Diagnosis not present

## 2023-08-02 DIAGNOSIS — M1712 Unilateral primary osteoarthritis, left knee: Secondary | ICD-10-CM | POA: Diagnosis not present

## 2023-08-25 ENCOUNTER — Encounter: Payer: Self-pay | Admitting: Cardiovascular Disease

## 2023-08-25 ENCOUNTER — Ambulatory Visit: Payer: Medicare Other | Attending: Cardiovascular Disease | Admitting: Cardiovascular Disease

## 2023-08-25 VITALS — BP 122/70 | Ht 63.0 in | Wt 179.0 lb

## 2023-08-25 DIAGNOSIS — I10 Essential (primary) hypertension: Secondary | ICD-10-CM | POA: Diagnosis not present

## 2023-08-25 DIAGNOSIS — I480 Paroxysmal atrial fibrillation: Secondary | ICD-10-CM | POA: Diagnosis not present

## 2023-08-25 DIAGNOSIS — E785 Hyperlipidemia, unspecified: Secondary | ICD-10-CM | POA: Diagnosis not present

## 2023-08-25 MED ORDER — APIXABAN 5 MG PO TABS: ORAL_TABLET | ORAL | 0 refills | Status: DC

## 2023-08-25 NOTE — Patient Instructions (Signed)
 Medication Instructions:  No changes *If you need a refill on your cardiac medications before your next appointment, please call your pharmacy*   Lab Work: None ordered If you have labs (blood work) drawn today and your tests are completely normal, you will receive your results only by: MyChart Message (if you have MyChart) OR A paper copy in the mail If you have any lab test that is abnormal or we need to change your treatment, we will call you to review the results.   Testing/Procedures: None ordered   Follow-Up: At [facility], you and your health needs are our priority.  As part of our continuing mission to provide you with exceptional heart care, we have created designated Provider Care Teams.  These Care Teams include your primary Cardiologist (physician) and Advanced Practice Providers (APPs -  Physician Assistants and Nurse Practitioners) who all work together to provide you with the care you need, when you need it.  We recommend signing up for the patient portal called "MyChart".  Sign up information is provided on this After Visit Summary.  MyChart is used to connect with patients for Virtual Visits (Telemedicine).  Patients are able to view lab/test results, encounter notes, upcoming appointments, etc.  Non-urgent messages can be sent to your provider as well.   To learn more about what you can do with MyChart, go to [email or web address].    Your next appointment:   12 month(s)  Provider:   You may see Dr. Kirke Corin or one of the following Advanced Practice Providers on your designated Care Team:   Nicolasa Ducking, NP Eula Listen, PA-C Cadence Fransico Michael, PA-C Charlsie Quest, NP Carlos Levering, NP

## 2023-08-25 NOTE — Progress Notes (Signed)
 Cardiology Office Note   Date:  08/25/2023   ID:  Laurenashley, Viar 1941/06/13, MRN [id number]  PCP:  Alba Cory, MD  Cardiologist:   Lorine Bears, MD   Chief Complaint  Patient presents with   Follow-up    12 month follow up visit. Meds reviewed.       History of Present Illness: [name] is a 83 y.o. female who presents for a follow-up visit regarding paroxysmal atrial fibrillation.  Atrial fibrillation happened in the past in the setting of severe vertigo and it was felt to be vagally mediated.  Other past medical history includes TIA, vertigo, small left thalamic artery aneurysm, essential hypertension, hyperlipidemia and insomnia. She was hospitalized in July of 2019 with spasms involving her left hand.  MRI of the brain showed no evidence of acute stroke.  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  She had no eye symptoms.  She had an echocardiogram done that showed normal LV systolic function with no cardiac source of emboli.  Carotid Doppler showed mild nonobstructive disease.   She had a Lexiscan Myoview in July 2020 that was normal.  I  She has been doing well with no chest pain, shortness of breath or dizziness.  She describes rare palpitation that usually does not require diltiazem.  No issues with anticoagulation.   Past Medical History:  Diagnosis Date   History of stress test    a. 02/2013 Nl stress test.   Hypertension    Insomnia, unspecified    Mitral regurgitation    a. echo 01/2016: nl LV sys fxn, mild MR, w/o pulm htn, nl atrial size   Osteoporosis, unspecified    Paroxysmal atrial fibrillation (HCC) 01/2016   a. diagnosed 01/2016; b. has been on eliquis, pradaxa, and xarelto at varying times 2/2 cost of each medication-->currently on eliquis (11/2016)   Pure hypercholesterolemia    Sleep apnea    Unable to tolerate nocturnal PAP therapy   TIA (transient ischemic attack)    Unspecified asthma(493.90)    Vertigo      Past Surgical History:  Procedure Laterality Date   ABDOMINAL HYSTERECTOMY     CATARACT EXTRACTION Bilateral 08/31/2016   COLONOSCOPY WITH PROPOFOL N/A 03/04/2015   Procedure: COLONOSCOPY WITH PROPOFOL;  Surgeon: Midge Minium, MD;  Location: [facility] ENDOSCOPY;  Service: Endoscopy;  Laterality: N/A;   COLONOSCOPY WITH PROPOFOL N/A 04/01/2020   Procedure: COLONOSCOPY WITH PROPOFOL;  Surgeon: Midge Minium, MD;  Location: [facility] ENDOSCOPY;  Service: Endoscopy;  Laterality: N/A;   DIAGNOSTIC LAPAROSCOPY     REMOVAL OF SCAR TISSUE (ADHESIONS)   KNEE ARTHROPLASTY Right 08/14/2021   Procedure: COMPUTER ASSISTED TOTAL KNEE ARTHROPLASTY - RNFA;  Surgeon: Donato Heinz, MD;  Location: [facility] ORS;  Service: Orthopedics;  Laterality: Right;   KNEE ARTHROSCOPY Right 10/08/2020   Procedure: ARTHROSCOPY KNEE;  Surgeon: Donato Heinz, MD;  Location: [facility] ORS;  Service: Orthopedics;  Laterality: Right;   KNEE ARTHROSCOPY Right 10/21/2020   Procedure: Arthroscopic irrigation and debridement right knee;  Surgeon: Donato Heinz, MD;  Location: [facility] ORS;  Service: Orthopedics;  Laterality: Right;     Current Outpatient Medications  Medication Sig Dispense Refill   acetaminophen (TYLENOL) 650 MG CR tablet Take 650 mg by mouth every 8 (eight) hours as needed for pain.     albuterol (VENTOLIN HFA) 108 (90 Base) MCG/ACT inhaler Inhale 2 puffs into the lungs every 6 (six) hours as needed for wheezing or shortness of  breath. 6.7 g 0   amLODipine (NORVASC) 5 MG tablet Take 1 tablet (5 mg total) by mouth daily. 90 tablet 0   apixaban (ELIQUIS) 5 MG TABS tablet TAKE 1 TABLET(5 MG) BY MOUTH TWICE DAILY 60 tablet 5   Calcium-Magnesium-Vitamin D (CALCIUM 1200+D3 PO) Take 1 tablet by mouth 2 (two) times a week. 1200 mg calcium / 1000 units Vitamin D3     Cholecalciferol (VITAMIN D) 50 MCG (2000 UT) CAPS Take 1 capsule by mouth daily at 12 noon.     diltiazem (CARDIZEM) 30 MG tablet Take 1 tablet (30 mg total) by mouth  every 6 (six) hours as needed (for tachycardia/recurrent Afib.). 30 tablet 3   rosuvastatin (CRESTOR) 10 MG tablet Take 1 tablet (10 mg total) by mouth daily. 90 tablet 1   promethazine (PHENERGAN) 12.5 MG tablet Take 0.5 tablets (6.25 mg total) by mouth every 6 (six) hours as needed for up to 5 days for nausea or vomiting. 10 tablet 0   No current facility-administered medications for this visit.    Allergies:   Ace inhibitors and Penicillins    Social History:  The patient  reports that she has never smoked. She has never used smokeless tobacco. She reports that she does not drink alcohol and does not use drugs.   Family History:  The patient's family history includes Aneurysm in her mother; COPD in her father; Cancer in her brother; Healthy in her brother; Hypertension in her brother and mother; Stroke in her brother.    ROS:  Please see the history of present illness.   Otherwise, review of systems are positive for none.   All other systems are reviewed and negative.    PHYSICAL EXAM: VS:  BP 122/70   Ht 5\' 3"  (1.6 m)   Wt 179 lb (81.2 kg)   SpO2 94%   BMI 31.71 kg/m  , BMI Body mass index is 31.71 kg/m. GEN: Well nourished, well developed, in no acute distress  HEENT: normal  Neck: no JVD, carotid bruits, or masses Cardiac: RRR; no murmurs, rubs, or gallops, mild swelling of the right leg. Respiratory:  clear to auscultation bilaterally, normal work of breathing GI: soft, nontender, nondistended, + BS MS: no deformity or atrophy  Skin: warm and dry, no rash Neuro:  Strength and sensation are intact Psych: euthymic mood, full affect   EKG:  EKG is ordered today. EKG showed : Normal sinus rhythm Septal infarct , age undetermined When compared with ECG of 09-Apr-2022 10:59, Septal infarct is now Present T wave inversion no longer evident in Lateral leads   Recent Labs: 05/30/2023: ALT 20; BUN 18; Creat 0.91; Potassium 4.7; Sodium 141    Lipid Panel    Component  Value Date/Time   CHOL 116 05/30/2023 1010   CHOL 98 (L) 01/09/2019 1023   TRIG 74 05/30/2023 1010   HDL 47 (L) 05/30/2023 1010   HDL 39 (L) 01/09/2019 1023   CHOLHDL 2.5 05/30/2023 1010   VLDL 7 01/22/2018 1154   LDLCALC 54 05/30/2023 1010   LDLDIRECT 46 01/09/2019 1023      Wt Readings from Last 3 Encounters:  08/25/23 179 lb (81.2 kg)  05/30/23 172 lb 12.8 oz (78.4 kg)  05/06/23 175 lb (79.4 kg)           02/23/2016   12:59 PM  PAD Screen  Previous PAD dx? No  Previous surgical procedure? No  Pain with walking? No  Feet/toe relief with dangling? No  Painful,  non-healing ulcers? No  Extremities discolored? No      ASSESSMENT AND PLAN:  1.   Paroxysmal atrial fibrillation: She is maintaining in sinus rhythm with no symptomatic episodes over the last year.  She is tolerating anticoagulation with Eliquis with no side effects.  Most recent labs showed normal renal function and no evidence of anemia.   2. Essential hypertension: Blood pressure is controlled on current medications.  3. Possible benign positional vertigo.  Seems to be overall controlled.  4.  Hyperlipidemia: Currently on rosuvastatin.  Most recent lipid profile showed an LDL of 54.    Disposition:   FU with me in 12 months  Signed,  Lorine Bears, MD  08/25/2023 4:31 PM    [facility]

## 2023-09-02 ENCOUNTER — Other Ambulatory Visit: Payer: Self-pay

## 2023-09-02 NOTE — Patient Instructions (Addendum)
 Thank you for allowing the Care Management team to participate in your care. It was great speaking with you!   We will follow up on Nov 04, 2023 at 2 pm. Please do not hesitate to contact me if you require assistance prior to our next outreach.    [name] [facility] RN Care Manager Direct Dial: [phone]  Fax: [phone] Website: [email or web address]

## 2023-09-02 NOTE — Patient Outreach (Signed)
  Care Management   Visit Note  09/02/2023 Name: [name] MRN: [id number] DOB: 02-24-1941  Subjective: [name] is a 83 y.o. year old female who is a primary care patient of Alba Cory, MD.  Engaged with [name] via telephone today.  Assessment:  Review of patient past medical history, allergies, medications, health status, including review of consultants reports, laboratory and other test data, was performed as part of  evaluation and provision of care management services.   Outpatient Encounter Medications as of 09/02/2023  Medication Sig   acetaminophen (TYLENOL) 650 MG CR tablet Take 650 mg by mouth every 8 (eight) hours as needed for pain.   albuterol (VENTOLIN HFA) 108 (90 Base) MCG/ACT inhaler Inhale 2 puffs into the lungs every 6 (six) hours as needed for wheezing or shortness of breath.   amLODipine (NORVASC) 5 MG tablet Take 1 tablet (5 mg total) by mouth daily.   apixaban (ELIQUIS) 5 MG TABS tablet TAKE 1 TABLET(5 MG) BY MOUTH TWICE DAILY   Calcium-Magnesium-Vitamin D (CALCIUM 1200+D3 PO) Take 1 tablet by mouth 2 (two) times a week. 1200 mg calcium / 1000 units Vitamin D3   Cholecalciferol (VITAMIN D) 50 MCG (2000 UT) CAPS Take 1 capsule by mouth daily at 12 noon.   diltiazem (CARDIZEM) 30 MG tablet Take 1 tablet (30 mg total) by mouth every 6 (six) hours as needed (for tachycardia/recurrent Afib.).   promethazine (PHENERGAN) 12.5 MG tablet Take 0.5 tablets (6.25 mg total) by mouth every 6 (six) hours as needed for up to 5 days for nausea or vomiting.   rosuvastatin (CRESTOR) 10 MG tablet Take 1 tablet (10 mg total) by mouth daily.   No facility-administered encounter medications on file as of 09/02/2023.    Interventions:  Interventions Today    Flowsheet Row Most Recent Value  Chronic Disease   Chronic disease during today's visit Atrial Fibrillation (AFib), Hypertension (HTN)  General Interventions   General Interventions Discussed/Reviewed General  Interventions Reviewed, Labs, Doctor Visits  Doctor Visits Discussed/Reviewed Doctor Visits Reviewed, Specialist  PCP/Specialist Visits Compliance with follow-up visit  [Completed outreach with Dr. Arida/Cardiology as scheduled on 08/25/23.]  Exercise Interventions   Exercise Discussed/Reviewed Physical Activity  Physical Activity Discussed/Reviewed Physical Activity Reviewed  Education Interventions   Education Provided Provided Education  Provided Verbal Education On Nutrition, Labs, Medication, When to see the doctor  Labs Reviewed Lipid Profile  Nutrition Interventions   Nutrition Discussed/Reviewed Nutrition Reviewed, Adding fruits and vegetables, Decreasing sugar intake, Increasing proteins, Decreasing fats, Decreasing salt  Pharmacy Interventions   Pharmacy Dicussed/Reviewed Medications and their functions  Safety Interventions   Safety Discussed/Reviewed Safety Reviewed        PLAN [name] agreed to follow up in two months    Katina [facility] RN Care Manager Direct Dial: [phone]  Fax: [phone] Website: [email or web address]

## 2023-09-29 ENCOUNTER — Other Ambulatory Visit: Payer: Self-pay

## 2023-09-29 NOTE — Patient Instructions (Signed)
Thank you for allowing the Care Management team to participate in your care.

## 2023-09-29 NOTE — Patient Outreach (Unsigned)
  Care Management   Visit Note  09/29/2023 Name: [name] MRN: [id number] DOB: Jul 02, 1940  Subjective: [name] is a 83 y.o. year old female who is a primary care patient of Alba Cory, MD.   Engaged with Ms. Stooksbury via telephone today regarding order for antibiotics.  Assessment:  Review of patient past medical history, allergies, medications, health status, including review of consultants reports, laboratory and other test data, was performed as part of  evaluation and provision of care management services.   Outpatient Encounter Medications as of 09/29/2023  Medication Sig   acetaminophen (TYLENOL) 650 MG CR tablet Take 650 mg by mouth every 8 (eight) hours as needed for pain.   albuterol (VENTOLIN HFA) 108 (90 Base) MCG/ACT inhaler Inhale 2 puffs into the lungs every 6 (six) hours as needed for wheezing or shortness of breath.   amLODipine (NORVASC) 5 MG tablet Take 1 tablet (5 mg total) by mouth daily.   apixaban (ELIQUIS) 5 MG TABS tablet TAKE 1 TABLET(5 MG) BY MOUTH TWICE DAILY   Calcium-Magnesium-Vitamin D (CALCIUM 1200+D3 PO) Take 1 tablet by mouth 2 (two) times a week. 1200 mg calcium / 1000 units Vitamin D3   Cholecalciferol (VITAMIN D) 50 MCG (2000 UT) CAPS Take 1 capsule by mouth daily at 12 noon.   diltiazem (CARDIZEM) 30 MG tablet Take 1 tablet (30 mg total) by mouth every 6 (six) hours as needed (for tachycardia/recurrent Afib.).   promethazine (PHENERGAN) 12.5 MG tablet Take 0.5 tablets (6.25 mg total) by mouth every 6 (six) hours as needed for up to 5 days for nausea or vomiting.   rosuvastatin (CRESTOR) 10 MG tablet Take 1 tablet (10 mg total) by mouth daily.   No facility-administered encounter medications on file as of 09/29/2023.     Interventions: Interventions Today    Flowsheet Row Most Recent Value  Chronic Disease   Chronic disease during today's visit Other  [Medication Request]  General Interventions   General Interventions  Discussed/Reviewed General Interventions Reviewed, Communication with  Education Interventions   Education Provided Provided Education  Provided Verbal Education On Medication, When to see the doctor  Pharmacy Interventions   Pharmacy Dicussed/Reviewed Pharmacy Topics Reviewed, Medications and their functions  [Requires prophylactic antibiotic prior to dental procedure]

## 2023-09-29 NOTE — Patient Outreach (Unsigned)
  Care Management   Visit Note  09/29/2023 Name: [name] MRN: [id number] DOB: [birth year]  Subjective: [name] is a 83 y.o. year old female who is a primary care patient of Alba Cory, MD.    Message received from Ms. Defelice requesting Primary Care Provider order antibiotics prior to a pending procedure.  Per chart review, previous request was declined due to changes in guidelines for prophylactic antibiotics. She was previously advised to notify the provider performing the procedure for antibiotic orders.   PLAN Will follow up with Ms. Miyoshi to confirm provider performing the procedure to discuss request for antibiotics.    Juanell Fairly [facility] Population [facility] RN Care Manager Direct Dial: [phone]  Fax: [phone] Website: [email or web address]

## 2023-10-05 ENCOUNTER — Encounter: Payer: Self-pay | Admitting: Family Medicine

## 2023-10-05 ENCOUNTER — Ambulatory Visit (INDEPENDENT_AMBULATORY_CARE_PROVIDER_SITE_OTHER): Admitting: Family Medicine

## 2023-10-05 VITALS — BP 134/74 | HR 59 | Resp 16 | Ht 63.0 in | Wt 175.2 lb

## 2023-10-05 DIAGNOSIS — G3184 Mild cognitive impairment, so stated: Secondary | ICD-10-CM

## 2023-10-05 DIAGNOSIS — I10 Essential (primary) hypertension: Secondary | ICD-10-CM

## 2023-10-05 DIAGNOSIS — Z8673 Personal history of transient ischemic attack (TIA), and cerebral infarction without residual deficits: Secondary | ICD-10-CM | POA: Diagnosis not present

## 2023-10-05 MED ORDER — ATORVASTATIN CALCIUM 10 MG PO TABS: 10.0000 mg | ORAL_TABLET | Freq: Every day | ORAL | 0 refills | Status: DC

## 2023-10-05 NOTE — Progress Notes (Signed)
 Name: [name]   MRN: [id number]    DOB: 27-Dec-1940   Date:10/05/2023       Progress Note  Subjective  Chief Complaint  Chief Complaint  Patient presents with   Memory Loss    Pt states believes Rosuvastatin is causing her memory issues and its been going for a while    Discussed the use of AI scribe software for clinical note transcription with the patient, who gave verbal consent to proceed.  History of Present Illness [name] is an 83 year old female with a history of stroke who presents with memory loss.  She experiences progressive memory issues, initially forgetting the purpose of entering a room and having difficulty recalling names of familiar people. This has escalated to forgetting faces and not recognizing familiar individuals, even in familiar settings such as church. At a family gathering, she failed to recognize a cousin she has known for years. She describes forgetting to pay her cable bill, resulting in a doubled bill the following month. She repeats stories and questions to her daughter, who reminds her of previous conversations. She retains memories from the distant past better than recent events.  Her past medical history includes a stroke and a transient ischemic attack (TIA). She has not been able to tolerate a CPAP machine for her sleep apnea. She is currently taking rosuvastatin, which she suspects may be contributing to her memory issues, as she noticed these symptoms after starting the medication post-2023 hospitalization for a cerebrovascular accident (CVA).  No family history of dementia or Alzheimer's disease. She denies needing assistance with daily activities such as paying bills, although she did forget to pay a bill once recently.     Patient Active Problem List   Diagnosis Date Noted   Senile purpura (HCC) 05/24/2022   Benign paroxysmal positional vertigo 05/24/2022   Thrombocytosis 11/19/2021   Mixed hyperlipidemia 11/19/2021    Atherosclerosis of aorta (HCC) 11/19/2021   Mild protein-calorie malnutrition (HCC) 11/19/2021   OSA (obstructive sleep apnea) 11/19/2021   Pre-diabetes 11/19/2021   Lumbar stenosis with neurogenic claudication 11/19/2021   Vitamin D deficiency 11/19/2021   History of total right knee replacement 08/14/2021   Osteoarthritis of right knee 05/05/2020   History of colonic polyps    Polyp of transverse colon    Aneurysm (HCC) 02/08/2018   History of CVA (cerebrovascular accident) without residual deficits 01/24/2018   Aneurysm of ophthalmic artery 01/24/2018   Impingement syndrome of shoulder region 01/02/2018   History of cataract surgery, right 09/01/2016   Paroxysmal atrial fibrillation (HCC) 02/24/2016   Vertigo 02/24/2016   Low HDL (under 40) 02/17/2016   Hx of colonic polyps    Benign neoplasm of sigmoid colon    Mild intermittent asthma without complication 02/21/2015   Hyperglycemia 02/21/2015   Essential hypertension 02/20/2015   Inconclusive mammogram due to dense breasts 01/02/2015   Dense breast 01/02/2015   Abnormal EKG 02/23/2013    Social History   Tobacco Use   Smoking status: Never   Smokeless tobacco: Never   Tobacco comments:    smoking cessation materials not required  Substance Use Topics   Alcohol use: No    Alcohol/week: 0.0 standard drinks of alcohol     Current Outpatient Medications:    acetaminophen (TYLENOL) 650 MG CR tablet, Take 650 mg by mouth every 8 (eight) hours as needed for pain., Disp: , Rfl:    albuterol (VENTOLIN HFA) 108 (90 Base) MCG/ACT inhaler, Inhale 2 puffs into  the lungs every 6 (six) hours as needed for wheezing or shortness of breath., Disp: 6.7 g, Rfl: 0   amLODipine (NORVASC) 2.5 MG tablet, Take 2.5 mg by mouth daily as needed (ONLY if its high)., Disp: , Rfl:    amLODipine (NORVASC) 5 MG tablet, Take 1 tablet (5 mg total) by mouth daily., Disp: 90 tablet, Rfl: 0   apixaban (ELIQUIS) 5 MG TABS tablet, TAKE 1 TABLET(5 MG) BY  MOUTH TWICE DAILY, Disp: 42 tablet, Rfl: 0   atorvastatin (LIPITOR) 10 MG tablet, Take 1 tablet (10 mg total) by mouth daily., Disp: 90 tablet, Rfl: 0   Calcium-Magnesium-Vitamin D (CALCIUM 1200+D3 PO), Take 1 tablet by mouth 2 (two) times a week. 1200 mg calcium / 1000 units Vitamin D3, Disp: , Rfl:    Cholecalciferol (VITAMIN D) 50 MCG (2000 UT) CAPS, Take 1 capsule by mouth daily at 12 noon., Disp: , Rfl:    diltiazem (CARDIZEM) 30 MG tablet, Take 1 tablet (30 mg total) by mouth every 6 (six) hours as needed (for tachycardia/recurrent Afib.)., Disp: 30 tablet, Rfl: 3   promethazine (PHENERGAN) 12.5 MG tablet, Take 0.5 tablets (6.25 mg total) by mouth every 6 (six) hours as needed for up to 5 days for nausea or vomiting., Disp: 10 tablet, Rfl: 0  Allergies  Allergen Reactions   Ace Inhibitors Swelling    Patient states her tongue swell.   Penicillins Anxiety    IgE = 251 (WNL) on 07/31/2021  Has patient had a PCN reaction causing immediate rash, facial/tongue/throat swelling, SOB or lightheadedness with hypotension: No Has patient had a PCN reaction causing severe rash involving mucus membranes or skin necrosis: No Has patient had a PCN reaction that required hospitalization No Has patient had a PCN reaction occurring within the last 10 years: No If all of the above answers are "NO", then may proceed with Cephalosporin use.    ROS  Ten systems reviewed and is negative except as mentioned in HPI    Objective  Vitals:   10/05/23 1447 10/05/23 1524  BP: (!) 154/86 134/74  Pulse: (!) 59   Resp: 16   SpO2: 95%   Weight: 175 lb 3.2 oz (79.5 kg)   Height: 5\' 3"  (1.6 m)     Body mass index is 31.04 kg/m.  Physical Exam CONSTITUTIONAL: Patient appears well-developed and well-nourished. No distress. HEENT: Head atraumatic, normocephalic, neck supple. CARDIOVASCULAR: Normal rate, regular rhythm and normal heart sounds. No murmur heard. No BLE edema. PULMONARY: Effort normal and  breath sounds normal. No respiratory distress. ABDOMINAL: There is no tenderness or distention. MUSCULOSKELETAL: Normal gait. Without gross motor or sensory deficit. PSYCHIATRIC: Patient has a normal mood and affect. Behavior is normal. Judgment and thought content normal. NEUROLOGICAL: Memory test normal.     10/05/2023    3:00 PM 05/06/2023    8:32 AM 02/04/2022    8:26 AM 01/30/2019    9:18 AM 01/24/2018    9:36 AM  6CIT Screen  What Year? 0 points 0 points 0 points 0 points 0 points  What month? 0 points 0 points 0 points 0 points 0 points  What time? 0 points 0 points 0 points 0 points 0 points  Count back from 20 0 points 0 points 0 points 0 points 0 points  Months in reverse 0 points 0 points 0 points 0 points 0 points  Repeat phrase 2 points 0 points 0 points 0 points 2 points  Total Score 2 points 0 points 0  points 0 points 2 points     Assessment & Plan Mild Cognitive Impairment Memory issues noted, normal memory test today. Mild cognitive dysfunction suspected, not dementia. Discussed Alzheimer's possibility, further evaluation needed. Risks of stopping rosuvastatin include increased stroke risk. Switching to atorvastatin to assess cognitive symptom improvement. - Order CBC, CMP, TSH, vitamin B12, folate, RPR  - Refer to neurologist Dr. Sherryll Burger for further evaluation. - Switch rosuvastatin to atorvastatin. - Encourage regular physical activity, sunlight exposure, brain exercises. - Advise on healthy diet, avoid ultra-processed foods. - Educate on stress management, strategies for memory lapses.  Transient Ischemic Attack (TIA) Continued statin therapy crucial for stroke prevention despite cognitive concerns. - Continue statin therapy with atorvastatin. - Monitor cognitive function changes post-switch.  Hypertension Elevated blood pressure possibly stress-related, recheck bp normalized - Ensure adherence to blood pressure medication. - Monitor blood pressure regularly.

## 2023-10-06 LAB — COMPREHENSIVE METABOLIC PANEL WITH GFR
AG Ratio: 1.2 (calc) (ref 1.0–2.5)
ALT: 20 U/L (ref 6–29)
AST: 20 U/L (ref 10–35)
Albumin: 4.1 g/dL (ref 3.6–5.1)
Alkaline phosphatase (APISO): 64 U/L (ref 37–153)
BUN: 19 mg/dL (ref 7–25)
CO2: 27 mmol/L (ref 20–32)
Calcium: 9.9 mg/dL (ref 8.6–10.4)
Chloride: 106 mmol/L (ref 98–110)
Creat: 0.83 mg/dL (ref 0.60–0.95)
Globulin: 3.4 g/dL (ref 1.9–3.7)
Glucose, Bld: 128 mg/dL — ABNORMAL HIGH (ref 65–99)
Potassium: 4.2 mmol/L (ref 3.5–5.3)
Sodium: 139 mmol/L (ref 135–146)
Total Bilirubin: 0.4 mg/dL (ref 0.2–1.2)
Total Protein: 7.5 g/dL (ref 6.1–8.1)
eGFR: 71 mL/min/{1.73_m2}

## 2023-10-06 LAB — CBC WITH DIFFERENTIAL/PLATELET
Absolute Lymphocytes: 1675 {cells}/uL (ref 850–3900)
Absolute Monocytes: 596 {cells}/uL (ref 200–950)
Basophils Absolute: 20 {cells}/uL (ref 0–200)
Basophils Relative: 0.3 %
Eosinophils Absolute: 121 {cells}/uL (ref 15–500)
Eosinophils Relative: 1.8 %
HCT: 44.8 % (ref 35.0–45.0)
Hemoglobin: 15.3 g/dL (ref 11.7–15.5)
MCH: 30.8 pg (ref 27.0–33.0)
MCHC: 34.2 g/dL (ref 32.0–36.0)
MCV: 90.3 fL (ref 80.0–100.0)
MPV: 13.6 fL — ABNORMAL HIGH (ref 7.5–12.5)
Monocytes Relative: 8.9 %
Neutro Abs: 4288 {cells}/uL (ref 1500–7800)
Neutrophils Relative %: 64 %
Platelets: 324 10*3/uL (ref 140–400)
RBC: 4.96 Million/uL (ref 3.80–5.10)
RDW: 12.5 % (ref 11.0–15.0)
Total Lymphocyte: 25 %
WBC: 6.7 10*3/uL (ref 3.8–10.8)

## 2023-10-06 LAB — B12 AND FOLATE PANEL
Folate: 17 ng/mL
Vitamin B-12: 752 pg/mL (ref 200–1100)

## 2023-10-06 LAB — SYPHILIS: RPR W/REFLEX TO RPR TITER AND TREPONEMAL ANTIBODIES, TRADITIONAL SCREENING AND DIAGNOSIS ALGORITHM: RPR Ser Ql: NONREACTIVE

## 2023-10-06 LAB — TSH: TSH: 2.49 m[IU]/L (ref 0.40–4.50)

## 2023-10-07 ENCOUNTER — Encounter: Payer: Self-pay | Admitting: Family Medicine

## 2023-10-10 ENCOUNTER — Telehealth: Payer: Self-pay

## 2023-10-10 NOTE — Telephone Encounter (Signed)
 FYI

## 2023-10-10 NOTE — Telephone Encounter (Signed)
 Pt states she wants to stay on Rosuvastatin. She does not want to change this time. Please advise

## 2023-10-16 ENCOUNTER — Other Ambulatory Visit: Payer: Self-pay | Admitting: Family Medicine

## 2023-10-16 MED ORDER — ROSUVASTATIN CALCIUM 10 MG PO TABS: 10.0000 mg | ORAL_TABLET | Freq: Every day | ORAL | 3 refills | Status: AC

## 2023-10-31 ENCOUNTER — Other Ambulatory Visit: Payer: Self-pay

## 2023-10-31 NOTE — Patient Outreach (Signed)
 Complex Care Management   Visit Note  10/31/2023  Name:  [name] MRN: [id number] DOB: [birth year]  Situation: Referral received for Complex Care Management related to Atrial Fibrillation, Hypertension, Mild Asthma. I obtained verbal consent from Patient.  Visit completed with [name] via telephone.  Background:   Past Medical History:  Diagnosis Date   History of stress test    a. 02/2013 Nl stress test.   Hypertension    Insomnia, unspecified    Mitral regurgitation    a. echo 01/2016: nl LV sys fxn, mild MR, w/o pulm htn, nl atrial size   Osteoporosis, unspecified    Paroxysmal atrial fibrillation (HCC) 01/2016   a. diagnosed 01/2016; b. has been on eliquis , pradaxa , and xarelto  at varying times 2/2 cost of each medication-->currently on eliquis  (11/2016)   Pure hypercholesterolemia    Sleep apnea    Unable to tolerate nocturnal PAP therapy   TIA (transient ischemic attack)    Unspecified asthma(493.90)    Vertigo     Assessment: Patient Reported Symptoms: Cognitive Cognitive Status: Alert and oriented to person, place, and time, Normal speech and language skills Cognitive/Intellectual Conditions Management [RPT]:  (Reports episodes of memory loss last month. Reports initially thinking it was due to statin therapy. After discussion with PCP, she opted to continue statin therapy and follow up with Neurology. She will be evaluated by Neurology on Nov 01, 2023.)   Health Maintenance Behaviors: Annual physical exam, Social activities Healing Pattern: Average Health Facilitated by: Pain control, Rest  Neurological Neurological Review of Symptoms: No symptoms reported Neurological Conditions:  (History of Transient Ischemic Attack) Neurological Management Strategies: Medical device, Medication therapy, Routine screening Neurological Self-Management Outcome: 4 (good)  HEENT HEENT Symptoms Reported: No symptoms reported HEENT Conditions:  (History of Vestibular  Vertigo.  Currently well controlled) HEENT Management Strategies: Medication therapy, Routine screening HEENT Self-Management Outcome: 4 (good)  (History of Vestibular  Vertigo. Currently well controlled)  Cardiovascular Cardiovascular Symptoms Reported: No symptoms reported Does patient have uncontrolled Hypertension?: No Cardiovascular Conditions: Dysrhythmia, High blood cholesterol, Hypertension Cardiovascular Management Strategies: Diet modification, Medication therapy, Medical device, Routine screening Cardiovascular Self-Management Outcome: 4 (good)  Respiratory Respiratory Symptoms Reported: No symptoms reported Respiratory Conditions: Asthma, Sleep disordered breathing Respiratory Self-Management Outcome: 4 (good)  Endocrine Patient reports the following symptoms related to hypoglycemia or hyperglycemia : No symptoms reported Is patient diabetic?: No (Pre-Diabetic) Endocrine Conditions: Vitamin D  deficiency Endocrine Management Strategies: Routine screening, Medication therapy Endocrine Self-Management Outcome: 4 (good)  Gastrointestinal Gastrointestinal Symptoms Reported: No symptoms reported Gastrointestinal Conditions: Nausea (Episodes of nausea which is currently well controlled. History of colon polyps) Gastrointestinal Management Strategies: Medication therapy Gastrointestinal Self-Management Outcome: 4 (good) Nutrition Risk Screen (CP): No indicators present  Genitourinary Genitourinary Symptoms Reported: No symptoms reported Genitourinary Comment: No Symptoms  Reported  Integumentary Integumentary Symptoms Reported: No symptoms reported Skin Management Strategies: Routine screening Skin Comment: No Symptoms Reported  Musculoskeletal Musculoskelatal Symptoms Reviewed: No symptoms reported Musculoskeletal Conditions: Osteoarthritis, Joint pain (History of right knee replacement. Currently receives injections for pain in left knee. She is being followed by [facility]) Musculoskeletal Management Strategies: Routine screening, Coping strategies, Medication therapy Musculoskeletal Self-Management Outcome: 4 (good) Falls in the past year?: No Number of falls in past year: 1 or less Was there an injury with Fall?: No Fall Risk Category Calculator: 0 Patient Fall Risk Level: Low Fall Risk Patient at Risk for Falls Due to: Medication side effect, Orthopedic patient Fall risk Follow up: Falls prevention discussed  Psychosocial Psychosocial Symptoms Reported: No symptoms reported   Major Change/Loss/Stressor/Fears (CP): Denies        10/31/2023    2:12 PM  Depression screen PHQ 2/9  Decreased Interest 0  Down, Depressed, Hopeless 0  PHQ - 2 Score 0    There were no vitals filed for this visit.  Medications Reviewed Today     Reviewed by Roxie Cord, RN (Registered Nurse) on 10/31/23 at 1403  Med List Status: <None>   Medication Order Taking? Sig Documenting Provider Last Dose Status Informant  acetaminophen  (TYLENOL ) 650 MG CR tablet [id number]  Take 650 mg by mouth every 8 (eight) hours as needed for pain. [provider]  Active Self  albuterol  (VENTOLIN  HFA) 108 (90 Base) MCG/ACT inhaler [id number]  Inhale 2 puffs into the lungs every 6 (six) hours as needed for wheezing or shortness of breath. Sowles, Krichna, MD  Active   amLODipine  (NORVASC ) 2.5 MG tablet [id number]  Take 2.5 mg by mouth daily as needed (ONLY if its high). [provider]  Active   amLODipine  (NORVASC ) 5 MG tablet [id number]  Take 1 tablet (5 mg total) by mouth daily. Sowles, Krichna, MD  Active   apixaban  (ELIQUIS ) 5 MG TABS tablet [id number]  TAKE 1 TABLET(5 MG) BY MOUTH TWICE DAILY Wenona Hamilton, MD  Active   Calcium -Magnesium -Vitamin D  (CALCIUM  1200+D3 PO) [id number]  Take 1 tablet by mouth 2 (two) times a week. 1200 mg calcium  / 1000 units Vitamin D3 [provider]  Active Self  Cholecalciferol (VITAMIN D ) 50 MCG (2000 UT) CAPS  [id number]  Take 1 capsule by mouth daily at 12 noon. [provider]  Active   diltiazem  (CARDIZEM ) 30 MG tablet [id number]  Take 1 tablet (30 mg total) by mouth every 6 (six) hours as needed (for tachycardia/recurrent Afib.). Florette Hurry, NP  Active Self           Med Note Elissa Guise   Thu Nov 19, 2021  9:25 AM)    promethazine  (PHENERGAN ) 12.5 MG tablet [id number]  Take 0.5 tablets (6.25 mg total) by mouth every 6 (six) hours as needed for up to 5 days for nausea or vomiting. Sowles, Krichna, MD  Expired 06/04/23 2359   rosuvastatin  (CRESTOR ) 10 MG tablet [id number] Yes Take 1 tablet (10 mg total) by mouth daily. Sowles, Krichna, MD Taking Active             Recommendation:   Complete outreach with the Neurology team on Nov 01, 2023 Follow up with PCP as scheduled on November 28, 2023.  Follow Up Plan:   Telephone follow up with Nurse Case Manager on December 14, 2023   Roxie Cord [facility] RN Care Manager Direct Dial: [phone]  Fax: [phone] Website: [email or web address]

## 2023-10-31 NOTE — Patient Instructions (Signed)
 Thank you for allowing the Complex Care Management team to participate in your care. It was great speaking with you today!  Reminders: Please be sure to complete your appointment with the Neurology team as scheduled on Nov 01, 2023. Please be sure to complete your PCP appointment as scheduled on December 02, 2023.  We will follow up on December 14, 2023 at 1pm. Please do not hesitate to contact me if you require assistance prior to our next outreach.    Roxie Cord [facility] Population [facility] RN Care Manager Direct Dial: [phone]  Fax: [phone] Website: [email or web address]

## 2023-11-02 ENCOUNTER — Other Ambulatory Visit: Payer: Self-pay | Admitting: Family Medicine

## 2023-11-02 DIAGNOSIS — I10 Essential (primary) hypertension: Secondary | ICD-10-CM

## 2023-11-04 ENCOUNTER — Other Ambulatory Visit: Payer: Self-pay

## 2023-11-28 ENCOUNTER — Ambulatory Visit: Payer: Self-pay | Admitting: Family Medicine

## 2023-12-14 ENCOUNTER — Telehealth: Payer: Self-pay

## 2023-12-16 ENCOUNTER — Encounter: Payer: Self-pay | Admitting: Family Medicine

## 2023-12-16 ENCOUNTER — Ambulatory Visit (INDEPENDENT_AMBULATORY_CARE_PROVIDER_SITE_OTHER): Admitting: Family Medicine

## 2023-12-16 VITALS — BP 128/76 | HR 67 | Resp 16 | Ht 63.0 in | Wt 178.0 lb

## 2023-12-16 DIAGNOSIS — R21 Rash and other nonspecific skin eruption: Secondary | ICD-10-CM

## 2023-12-16 MED ORDER — HYDROCORTISONE 1 % EX CREA: 1.0000 | TOPICAL_CREAM | Freq: Two times a day (BID) | CUTANEOUS | Status: DC

## 2023-12-16 MED ORDER — LORATADINE 10 MG PO TABS: 10.0000 mg | ORAL_TABLET | Freq: Every day | ORAL | 1 refills | Status: DC

## 2023-12-16 MED ORDER — PREDNISONE 10 MG (21) PO TBPK: ORAL_TABLET | ORAL | 0 refills | Status: DC

## 2023-12-16 NOTE — Patient Instructions (Signed)
 I think this is a local reaction to something that probably bit or stung you. Apply ice to the area, start the 24 hour antihistamine and you can apply the hydrocortisone  to the area to help with swelling and itching.  If it starts to spread to other parts of your body I want you to continue the antihistamine and then start the steroid taper in the morning with food on your stomach. Also take all pills in the am with food Follow up if there is any worsening  You can also try topical benadryl  cream or calamine lotion

## 2023-12-16 NOTE — Progress Notes (Signed)
 [name], female    DOB: 09/28/1940, 83 y.o.   MRN: [id number]  PCP: Arleen Lacer, MD  Chief Complaint  Patient presents with   Rash    Upper L arm started yesterday, itching bad. OTC Benadryl  not helping.    Subjective:   [name] is a 83 y.o. female, presents to clinic with CC of the following:  Rash    Pt has some red itchy areas on left upper arm onset yesterday She doesn't know what happened She was visiting somewhere and saw bugs so left right away She had exterminators come to her home and they didn't find anything, she stripped her bed and checked all edges of mattress.   She has no new soaps, lotions, detergents. Rash only to left upper outer arm No one else lives with her or has similar  Not a rash but 3-4 areas of itching swelling and redness, tried a benadryl  yesterday but it just made her tired No change to area affected in the past 24 hours - no larger or smaller  Patient Active Problem List   Diagnosis Date Noted   Senile purpura (HCC) 05/24/2022   Benign paroxysmal positional vertigo 05/24/2022   Thrombocytosis 11/19/2021   Mixed hyperlipidemia 11/19/2021   Atherosclerosis of aorta (HCC) 11/19/2021   Mild protein-calorie malnutrition (HCC) 11/19/2021   OSA (obstructive sleep apnea) 11/19/2021   Pre-diabetes 11/19/2021   Lumbar stenosis with neurogenic claudication 11/19/2021   Vitamin D  deficiency 11/19/2021   History of total right knee replacement 08/14/2021   Osteoarthritis of right knee 05/05/2020   History of colonic polyps    Polyp of transverse colon    Aneurysm (HCC) 02/08/2018   History of CVA (cerebrovascular accident) without residual deficits 01/24/2018   Aneurysm of ophthalmic artery 01/24/2018   Impingement syndrome of shoulder region 01/02/2018   History of cataract surgery, right 09/01/2016   Paroxysmal atrial fibrillation (HCC) 02/24/2016   Vertigo 02/24/2016   Low HDL (under 40) 02/17/2016   Hx  of colonic polyps    Benign neoplasm of sigmoid colon    Mild intermittent asthma without complication 02/21/2015   Hyperglycemia 02/21/2015   Essential hypertension 02/20/2015   Inconclusive mammogram due to dense breasts 01/02/2015   Dense breast 01/02/2015   Abnormal EKG 02/23/2013      Current Outpatient Medications:    acetaminophen  (TYLENOL ) 650 MG CR tablet, Take 650 mg by mouth every 8 (eight) hours as needed for pain., Disp: , Rfl:    albuterol  (VENTOLIN  HFA) 108 (90 Base) MCG/ACT inhaler, Inhale 2 puffs into the lungs every 6 (six) hours as needed for wheezing or shortness of breath., Disp: 6.7 g, Rfl: 0   amLODipine  (NORVASC ) 2.5 MG tablet, Take 2.5 mg by mouth daily as needed (ONLY if its high)., Disp: , Rfl:    amLODipine  (NORVASC ) 5 MG tablet, Take 1 tablet (5 mg total) by mouth daily., Disp: 90 tablet, Rfl: 0   apixaban  (ELIQUIS ) 5 MG TABS tablet, TAKE 1 TABLET(5 MG) BY MOUTH TWICE DAILY, Disp: 42 tablet, Rfl: 0   Calcium -Magnesium -Vitamin D  (CALCIUM  1200+D3 PO), Take 1 tablet by mouth 2 (two) times a week. 1200 mg calcium  / 1000 units Vitamin D3, Disp: , Rfl:    Cholecalciferol (VITAMIN D ) 50 MCG (2000 UT) CAPS, Take 1 capsule by mouth daily at 12 noon., Disp: , Rfl:    diltiazem  (CARDIZEM ) 30 MG tablet, Take 1 tablet (30 mg total) by mouth every 6 (six) hours  as needed (for tachycardia/recurrent Afib.)., Disp: 30 tablet, Rfl: 3   hydrocortisone  cream 1 %, Apply 1 Application topically 2 (two) times daily. To affected area, Disp: , Rfl:    loratadine (CLARITIN) 10 MG tablet, Take 1 tablet (10 mg total) by mouth at bedtime., Disp: 30 tablet, Rfl: 1   predniSONE  (STERAPRED UNI-PAK 21 TAB) 10 MG (21) TBPK tablet, Take as directed on package.  (60 mg po on day 1, 50 mg po on day 2...), Disp: 21 tablet, Rfl: 0   promethazine  (PHENERGAN ) 12.5 MG tablet, Take 0.5 tablets (6.25 mg total) by mouth every 6 (six) hours as needed for up to 5 days for nausea or vomiting., Disp: 10 tablet,  Rfl: 0   rosuvastatin  (CRESTOR ) 10 MG tablet, Take 1 tablet (10 mg total) by mouth daily., Disp: 90 tablet, Rfl: 3   Allergies  Allergen Reactions   Ace Inhibitors Swelling    Patient states her tongue swell.   Penicillins Anxiety    IgE = 251 (WNL) on 07/31/2021  Has patient had a PCN reaction causing immediate rash, facial/tongue/throat swelling, SOB or lightheadedness with hypotension: No Has patient had a PCN reaction causing severe rash involving mucus membranes or skin necrosis: No Has patient had a PCN reaction that required hospitalization No Has patient had a PCN reaction occurring within the last 10 years: No If all of the above answers are NO, then may proceed with Cephalosporin use.     Social History   Tobacco Use   Smoking status: Never   Smokeless tobacco: Never   Tobacco comments:    smoking cessation materials not required  Vaping Use   Vaping status: Never Used  Substance Use Topics   Alcohol use: No    Alcohol/week: 0.0 standard drinks of alcohol   Drug use: No      Chart Review Today: I personally reviewed active problem list, medication list, allergies, family history, social history, health maintenance, notes from last encounter, lab results, imaging with the patient/caregiver today.   Review of Systems  Skin:  Positive for rash.       Objective:   Vitals:   12/16/23 1520  BP: 128/76  Pulse: 67  Resp: 16  SpO2: 95%  Weight: 178 lb (80.7 kg)  Height: 5' 3 (1.6 m)    Body mass index is 31.53 kg/m.  Physical Exam Vitals and nursing note reviewed.  Constitutional:      General: She is not in acute distress.    Appearance: Normal appearance. She is well-developed. She is obese. She is not ill-appearing, toxic-appearing or diaphoretic.  HENT:     Head: Normocephalic and atraumatic.     Right Ear: External ear normal.     Left Ear: External ear normal.     Nose: Nose normal.   Eyes:     General: No scleral icterus.       Right  eye: No discharge.        Left eye: No discharge.     Conjunctiva/sclera: Conjunctivae normal.   Neck:     Trachea: No tracheal deviation.   Cardiovascular:     Rate and Rhythm: Normal rate.  Pulmonary:     Effort: Pulmonary effort is normal. No respiratory distress.     Breath sounds: No stridor.   Skin:    General: Skin is warm and dry.     Findings: No rash.     Comments: Left upper outer arm 3-4 areas of edema with erythema in  circular patters 2 cm diameter to about 4 cm, without induration, fluctuance, tenderness Some excoriations and pt continued to scratch and itch arm throughout visit   Neurological:     Mental Status: She is alert.     Motor: No abnormal muscle tone.     Coordination: Coordination normal.     Gait: Gait normal.   Psychiatric:        Mood and Affect: Mood normal.        Behavior: Behavior normal.      Results for orders placed or performed in visit on 10/05/23  CBC with Differential/Platelet   Collection Time: 10/05/23  3:29 PM  Result Value Ref Range   WBC 6.7 3.8 - 10.8 Thousand/uL   RBC 4.96 3.80 - 5.10 Million/uL   Hemoglobin 15.3 11.7 - 15.5 g/dL   HCT 16.1 09.6 - 04.5 %   MCV 90.3 80.0 - 100.0 fL   MCH 30.8 27.0 - 33.0 pg   MCHC 34.2 32.0 - 36.0 g/dL   RDW 40.9 81.1 - 91.4 %   Platelets 324 140 - 400 Thousand/uL   MPV 13.6 (H) 7.5 - 12.5 fL   Neutro Abs 4,288 1,500 - 7,800 cells/uL   Absolute Lymphocytes 1,675 850 - 3,900 cells/uL   Absolute Monocytes 596 200 - 950 cells/uL   Eosinophils Absolute 121 15 - 500 cells/uL   Basophils Absolute 20 0 - 200 cells/uL   Neutrophils Relative % 64 %   Total Lymphocyte 25.0 %   Monocytes Relative 8.9 %   Eosinophils Relative 1.8 %   Basophils Relative 0.3 %  Comprehensive metabolic panel with GFR   Collection Time: 10/05/23  3:29 PM  Result Value Ref Range   Glucose, Bld 128 (H) 65 - 99 mg/dL   BUN 19 7 - 25 mg/dL   Creat 7.82 9.56 - 2.13 mg/dL   eGFR 71 > OR = 60 YQ/MVH/8.46N6    BUN/Creatinine Ratio SEE NOTE: 6 - 22 (calc)   Sodium 139 135 - 146 mmol/L   Potassium 4.2 3.5 - 5.3 mmol/L   Chloride 106 98 - 110 mmol/L   CO2 27 20 - 32 mmol/L   Calcium  9.9 8.6 - 10.4 mg/dL   Total Protein 7.5 6.1 - 8.1 g/dL   Albumin 4.1 3.6 - 5.1 g/dL   Globulin 3.4 1.9 - 3.7 g/dL (calc)   AG Ratio 1.2 1.0 - 2.5 (calc)   Total Bilirubin 0.4 0.2 - 1.2 mg/dL   Alkaline phosphatase (APISO) 64 37 - 153 U/L   AST 20 10 - 35 U/L   ALT 20 6 - 29 U/L  TSH   Collection Time: 10/05/23  3:29 PM  Result Value Ref Range   TSH 2.49 0.40 - 4.50 mIU/L  B12 and Folate Panel   Collection Time: 10/05/23  3:29 PM  Result Value Ref Range   Vitamin B-12 752 200 - 1,100 pg/mL   Folate 17.0 ng/mL  RPR   Collection Time: 10/05/23  3:29 PM  Result Value Ref Range   RPR Ser Ql NON-REACTIVE NON-REACTIVE       Assessment & Plan:     ICD-10-CM   1. Rash and nonspecific skin eruption  R21 loratadine (CLARITIN) 10 MG tablet    hydrocortisone  cream 1 %     Suspect she got some kind on insect bite or sting and has a localized allergic reaction with itching and minimal swelling, the area is not spreading and there are no signs of infection or  cellulitis Conservative management was recommended with taking a daily 24-hour antihistamine and she can try topical hydrocortisone  cream and ice and she was encouraged to try and stop scratching and itching it which will continue to prolong the histamine reaction. Onset of this skin reaction was about 24 hours ago and there has been no spreading or worsening say anticipate that it will improve in about 24 to 48 hours. However since it is Friday afternoon I did send in a prednisone  taper pack in case she has any severe spreading of the redness and itching.  Instructions given to pt today on AVS going over plan:    I think this is a local reaction to something that probably bit or stung you. Apply ice to the area, start the 24 hour antihistamine and you can apply  the hydrocortisone  to the area to help with swelling and itching.  If it starts to spread to other parts of your body I want you to continue the antihistamine and then start the steroid taper in the morning with food on your stomach. Also take all pills in the am with food Follow up if there is any worsening  You can also try topical benadryl  cream or calamine lotion   Return for As needed if not improving.      Adeline Hone, PA-C 12/16/23 5:36 PM

## 2023-12-21 ENCOUNTER — Other Ambulatory Visit: Payer: Self-pay

## 2023-12-23 NOTE — Patient Outreach (Signed)
 Complex Care Management   Visit Note    Name:  [name] MRN: [id number] DOB: 1940/12/03  Situation: Referral received for Complex Care Management related to Atrial Fibrillation and Hypertension. I obtained verbal consent from Patient.  Visit completed with [name] via telephone.  Background:   Past Medical History:  Diagnosis Date   History of stress test    a. 02/2013 Nl stress test.   Hypertension    Insomnia, unspecified    Mitral regurgitation    a. echo 01/2016: nl LV sys fxn, mild MR, w/o pulm htn, nl atrial size   Osteoporosis, unspecified    Paroxysmal atrial fibrillation (HCC) 01/2016   a. diagnosed 01/2016; b. has been on eliquis , pradaxa , and xarelto  at varying times 2/2 cost of each medication-->currently on eliquis  (11/2016)   Pure hypercholesterolemia    Sleep apnea    Unable to tolerate nocturnal PAP therapy   TIA (transient ischemic attack)    Unspecified asthma(493.90)    Vertigo     Assessment: Patient Reported Symptoms: Cognitive Cognitive Status: Alert and oriented to person, place, and time, Normal speech and language skills  Neurological Neurological Review of Symptoms: No symptoms reported  HEENT HEENT Symptoms Reported: No symptoms reported  Cardiovascular Cardiovascular Symptoms Reported: No symptoms reported Does patient have uncontrolled Hypertension?: No  Respiratory Respiratory Symptoms Reported: No symptoms reported  Endocrine Patient reports the following symptoms related to hypoglycemia or hyperglycemia : No symptoms reported Is patient diabetic?: No  Gastrointestinal Gastrointestinal Symptoms Reported: No symptoms reported  Genitourinary Genitourinary Symptoms Reported: No symptoms reported  Integumentary Integumentary Symptoms Reported: No symptoms reported  Musculoskeletal Musculoskelatal Symptoms Reviewed: No symptoms reported  Psychosocial Psychosocial Symptoms Reported: No symptoms reported Quality of Family Relationships:  supportive Do you feel physically threatened by others?: No      12/21/2023    1:40 PM  Depression screen PHQ 2/9  Decreased Interest 0  Down, Depressed, Hopeless 0  PHQ - 2 Score 0    There were no vitals filed for this visit.  Outpatient Encounter Medications as of 12/21/2023  Medication Sig   acetaminophen  (TYLENOL ) 650 MG CR tablet Take 650 mg by mouth every 8 (eight) hours as needed for pain.   albuterol  (VENTOLIN  HFA) 108 (90 Base) MCG/ACT inhaler Inhale 2 puffs into the lungs every 6 (six) hours as needed for wheezing or shortness of breath.   amLODipine  (NORVASC ) 2.5 MG tablet Take 2.5 mg by mouth daily as needed (ONLY if its high).   amLODipine  (NORVASC ) 5 MG tablet Take 1 tablet (5 mg total) by mouth daily.   apixaban  (ELIQUIS ) 5 MG TABS tablet TAKE 1 TABLET(5 MG) BY MOUTH TWICE DAILY   Calcium -Magnesium -Vitamin D  (CALCIUM  1200+D3 PO) Take 1 tablet by mouth 2 (two) times a week. 1200 mg calcium  / 1000 units Vitamin D3   Cholecalciferol (VITAMIN D ) 50 MCG (2000 UT) CAPS Take 1 capsule by mouth daily at 12 noon.   diltiazem  (CARDIZEM ) 30 MG tablet Take 1 tablet (30 mg total) by mouth every 6 (six) hours as needed (for tachycardia/recurrent Afib.).   hydrocortisone  cream 1 % Apply 1 Application topically 2 (two) times daily. To affected area   loratadine  (CLARITIN ) 10 MG tablet Take 1 tablet (10 mg total) by mouth at bedtime.   predniSONE  (STERAPRED UNI-PAK 21 TAB) 10 MG (21) TBPK tablet Take as directed on package.  (60 mg po on day 1, 50 mg po on day 2...)   promethazine  (PHENERGAN ) 12.5 MG tablet Take  0.5 tablets (6.25 mg total) by mouth every 6 (six) hours as needed for up to 5 days for nausea or vomiting.   rosuvastatin  (CRESTOR ) 10 MG tablet Take 1 tablet (10 mg total) by mouth daily.   No facility-administered encounter medications on file as of 12/21/2023.      Recommendation:   PCP follow up as scheduled on January 11, 2024 Continue Current Plan of Care  Follow Up  Plan:   Telephone follow up appointment with Nurse Case Manager on January 17, 2024.   Jackson Acron [facility] Population Health RN Care Manager Direct Dial: [phone]  Fax: [phone] Website: [email or web address]

## 2023-12-23 NOTE — Patient Instructions (Addendum)
 Thank you for allowing the Complex Care Management team to participate in your care. It was great speaking with you!   Reminders: Please attend your clinic appointment with Dr. Glenard as scheduled on January 11, 2024.   We will follow up on January 17, 2024 at 1230. Please do not hesitate to contact me if you require assistance prior to our next outreach.    Jackson Acron [facility] Population Health RN Care Manager Direct Dial: [phone]  Fax: [phone] Website: [email or web address]

## 2024-01-11 ENCOUNTER — Other Ambulatory Visit: Payer: Self-pay | Admitting: Family Medicine

## 2024-01-11 ENCOUNTER — Ambulatory Visit (INDEPENDENT_AMBULATORY_CARE_PROVIDER_SITE_OTHER): Admitting: Family Medicine

## 2024-01-11 ENCOUNTER — Encounter: Payer: Self-pay | Admitting: Family Medicine

## 2024-01-11 ENCOUNTER — Telehealth: Payer: Self-pay

## 2024-01-11 VITALS — BP 118/70 | HR 86 | Resp 16 | Ht 63.0 in | Wt 174.3 lb

## 2024-01-11 DIAGNOSIS — G3184 Mild cognitive impairment, so stated: Secondary | ICD-10-CM

## 2024-01-11 DIAGNOSIS — J4520 Mild intermittent asthma, uncomplicated: Secondary | ICD-10-CM

## 2024-01-11 DIAGNOSIS — I10 Essential (primary) hypertension: Secondary | ICD-10-CM

## 2024-01-11 DIAGNOSIS — R42 Dizziness and giddiness: Secondary | ICD-10-CM | POA: Diagnosis not present

## 2024-01-11 DIAGNOSIS — Z8673 Personal history of transient ischemic attack (TIA), and cerebral infarction without residual deficits: Secondary | ICD-10-CM

## 2024-01-11 DIAGNOSIS — E559 Vitamin D deficiency, unspecified: Secondary | ICD-10-CM | POA: Diagnosis not present

## 2024-01-11 DIAGNOSIS — I729 Aneurysm of unspecified site: Secondary | ICD-10-CM | POA: Diagnosis not present

## 2024-01-11 DIAGNOSIS — I700 Atherosclerosis of aorta: Secondary | ICD-10-CM

## 2024-01-11 DIAGNOSIS — G4733 Obstructive sleep apnea (adult) (pediatric): Secondary | ICD-10-CM | POA: Diagnosis not present

## 2024-01-11 DIAGNOSIS — I480 Paroxysmal atrial fibrillation: Secondary | ICD-10-CM

## 2024-01-11 MED ORDER — PROMETHAZINE HCL 12.5 MG PO TABS: 6.2500 mg | ORAL_TABLET | Freq: Four times a day (QID) | ORAL | 0 refills | Status: AC | PRN

## 2024-01-11 MED ORDER — AMLODIPINE BESYLATE 2.5 MG PO TABS: 2.5000 mg | ORAL_TABLET | Freq: Two times a day (BID) | ORAL | 1 refills | Status: DC

## 2024-01-11 MED ORDER — BUDESONIDE-FORMOTEROL FUMARATE 160-4.5 MCG/ACT IN AERO: 2.0000 | INHALATION_SPRAY | Freq: Two times a day (BID) | RESPIRATORY_TRACT | 0 refills | Status: DC

## 2024-01-11 NOTE — Telephone Encounter (Signed)
 Copied from CRM [id number]. Topic: Clinical - Prescription Issue >> Jan 11, 2024 10:52 AM Marylynn H wrote: Reason for CRM: Spoke with Doretta from [facility] who is calling to inform that the quantity for budesonide -formoterol  (SYMBICORT ) 160-4.5 MCG/ACT inhaler is wrong, need updated script with quantity of 10.2 sent over. Ty    [facility] [place], [employer] - [address]

## 2024-01-11 NOTE — Patient Instructions (Signed)
 Please call Dr. Maree to find out about MRA order

## 2024-01-11 NOTE — Progress Notes (Signed)
 Name: [name]   MRN: [id number]    DOB: [birth year]   Date:01/11/2024       Progress Note  Subjective  Chief Complaint  Chief Complaint  Patient presents with   Medical Management of Chronic Issues   Discussed the use of AI scribe software for clinical note transcription with the patient, who gave verbal consent to proceed.  History of Present Illness [name] is an 83 year old female with a history of TIA and ICA aneurysm who presents for a six-month follow-up regarding cognitive dysfunction and occipital neuralgia.  She has ongoing concerns about cognitive dysfunction, feeling increasingly forgetful. She previously consulted a neurologist who indicated that her condition remained stable. Despite this, she remains concerned about her cognitive health. She has a history of a mini stroke and an internal carotid artery aneurysm, confirmed by MRI. She is particularly worried due to a family history of aneurysm rupture. She is currently on Eliquis  due to her history of TIA. An MRA of the head was ordered but not yet scheduled.  She experiences occipital neuralgia with right-sided pain and severe headaches, although she reports improvement in these symptoms. She occasionally experiences slight headaches that resolve on their own. No current headaches, dizziness, or nausea.  Her medical history includes paroxysmal atrial fibrillation, for which she takes Eliquis . She does not experience symptoms like heart palpitations or shortness of breath. She was previously prescribed diltiazem  30 mg to use prn  for rhythm control but has not needed to use it.  She has atherosclerosis of the aorta and takes rosuvastatin  without any side effects. Her blood pressure is managed with amlodipine , taking two 2.5 mg tablets in the morning and one at night, maintaining her blood pressure around 120 mmHg at home.  She occasionally experiences vertigo and has a prescription for promethazine  12.5 mg, which  she uses as needed. She has obstructive sleep apnea but has not been able to tolerate CPAP therapy.  Her asthma is mild and intermittent, with occasional wheezing. She uses Symbicort  160 as needed during flare-ups. She is not consistent with her vitamin D  supplementation.    Patient Active Problem List   Diagnosis Date Noted   Senile purpura (HCC) 05/24/2022   Benign paroxysmal positional vertigo 05/24/2022   Thrombocytosis 11/19/2021   Mixed hyperlipidemia 11/19/2021   Atherosclerosis of aorta (HCC) 11/19/2021   Mild protein-calorie malnutrition (HCC) 11/19/2021   OSA (obstructive sleep apnea) 11/19/2021   Pre-diabetes 11/19/2021   Lumbar stenosis with neurogenic claudication 11/19/2021   Vitamin D  deficiency 11/19/2021   History of total right knee replacement 08/14/2021   Osteoarthritis of right knee 05/05/2020   History of colonic polyps    Polyp of transverse colon    Aneurysm (HCC) 02/08/2018   History of CVA (cerebrovascular accident) without residual deficits 01/24/2018   Aneurysm of ophthalmic artery 01/24/2018   Impingement syndrome of shoulder region 01/02/2018   History of cataract surgery, right 09/01/2016   Paroxysmal atrial fibrillation (HCC) 02/24/2016   Vertigo 02/24/2016   Low HDL (under 40) 02/17/2016   Hx of colonic polyps    Benign neoplasm of sigmoid colon    Mild intermittent asthma without complication 02/21/2015   Hyperglycemia 02/21/2015   Essential hypertension 02/20/2015   Inconclusive mammogram due to dense breasts 01/02/2015   Dense breast 01/02/2015   Abnormal EKG 02/23/2013    Past Surgical History:  Procedure Laterality Date   ABDOMINAL HYSTERECTOMY     CATARACT EXTRACTION Bilateral 08/31/2016   COLONOSCOPY  WITH PROPOFOL  N/A 03/04/2015   Procedure: COLONOSCOPY WITH PROPOFOL ;  Surgeon: Rogelia Copping, MD;  Location: [facility];  Service: Endoscopy;  Laterality: N/A;   COLONOSCOPY WITH PROPOFOL  N/A 04/01/2020   Procedure: COLONOSCOPY WITH  PROPOFOL ;  Surgeon: Copping Rogelia, MD;  Location: [facility];  Service: Endoscopy;  Laterality: N/A;   DIAGNOSTIC LAPAROSCOPY     REMOVAL OF SCAR TISSUE (ADHESIONS)   KNEE ARTHROPLASTY Right 08/14/2021   Procedure: COMPUTER ASSISTED TOTAL KNEE ARTHROPLASTY - RNFA;  Surgeon: Mardee Lynwood SQUIBB, MD;  Location: [facility];  Service: Orthopedics;  Laterality: Right;   KNEE ARTHROSCOPY Right 10/08/2020   Procedure: ARTHROSCOPY KNEE;  Surgeon: Mardee Lynwood SQUIBB, MD;  Location: [facility];  Service: Orthopedics;  Laterality: Right;   KNEE ARTHROSCOPY Right 10/21/2020   Procedure: Arthroscopic irrigation and debridement right knee;  Surgeon: Mardee Lynwood SQUIBB, MD;  Location: [facility];  Service: Orthopedics;  Laterality: Right;    Family History  Problem Relation Age of Onset   COPD Father    Hypertension Mother    Aneurysm Mother    Hypertension Brother    Stroke Brother    Cancer Brother        pancreatic   Healthy Brother     Social History   Tobacco Use   Smoking status: Never   Smokeless tobacco: Never   Tobacco comments:    smoking cessation materials not required  Substance Use Topics   Alcohol use: No    Alcohol/week: 0.0 standard drinks of alcohol     Current Outpatient Medications:    acetaminophen  (TYLENOL ) 650 MG CR tablet, Take 650 mg by mouth every 8 (eight) hours as needed for pain., Disp: , Rfl:    albuterol  (VENTOLIN  HFA) 108 (90 Base) MCG/ACT inhaler, Inhale 2 puffs into the lungs every 6 (six) hours as needed for wheezing or shortness of breath., Disp: 6.7 g, Rfl: 0   amLODipine  (NORVASC ) 2.5 MG tablet, Take 2.5 mg by mouth daily as needed (ONLY if its high)., Disp: , Rfl:    amLODipine  (NORVASC ) 5 MG tablet, Take 1 tablet (5 mg total) by mouth daily., Disp: 90 tablet, Rfl: 0   apixaban  (ELIQUIS ) 5 MG TABS tablet, TAKE 1 TABLET(5 MG) BY MOUTH TWICE DAILY, Disp: 42 tablet, Rfl: 0   Cholecalciferol (VITAMIN D ) 50 MCG (2000 UT) CAPS, Take 1 capsule by mouth daily at 12 noon.,  Disp: , Rfl:    diltiazem  (CARDIZEM ) 30 MG tablet, Take 1 tablet (30 mg total) by mouth every 6 (six) hours as needed (for tachycardia/recurrent Afib.)., Disp: 30 tablet, Rfl: 3   loratadine  (CLARITIN ) 10 MG tablet, Take 1 tablet (10 mg total) by mouth at bedtime., Disp: 30 tablet, Rfl: 1   promethazine  (PHENERGAN ) 12.5 MG tablet, Take 0.5 tablets (6.25 mg total) by mouth every 6 (six) hours as needed for up to 5 days for nausea or vomiting., Disp: 10 tablet, Rfl: 0   rosuvastatin  (CRESTOR ) 10 MG tablet, Take 1 tablet (10 mg total) by mouth daily., Disp: 90 tablet, Rfl: 3   Calcium -Magnesium -Vitamin D  (CALCIUM  1200+D3 PO), Take 1 tablet by mouth 2 (two) times a week. 1200 mg calcium  / 1000 units Vitamin D3 (Patient not taking: Reported on 01/11/2024), Disp: , Rfl:    hydrocortisone  cream 1 %, Apply 1 Application topically 2 (two) times daily. To affected area (Patient not taking: Reported on 01/11/2024), Disp: , Rfl:    predniSONE  (STERAPRED UNI-PAK 21 TAB) 10 MG (21) TBPK tablet, Take as directed on package.  (  60 mg po on day 1, 50 mg po on day 2...) (Patient not taking: Reported on 01/11/2024), Disp: 21 tablet, Rfl: 0  Allergies  Allergen Reactions   Ace Inhibitors Swelling    Patient states her tongue swell.   Penicillins Anxiety    IgE = 251 (WNL) on 07/31/2021  Has patient had a PCN reaction causing immediate rash, facial/tongue/throat swelling, SOB or lightheadedness with hypotension: No Has patient had a PCN reaction causing severe rash involving mucus membranes or skin necrosis: No Has patient had a PCN reaction that required hospitalization No Has patient had a PCN reaction occurring within the last 10 years: No If all of the above answers are NO, then may proceed with Cephalosporin use.    I personally reviewed active problem list, medication list, allergies, family history with the patient/caregiver today.   ROS  Ten systems reviewed and is negative except as mentioned in HPI     Objective Physical Exam  CONSTITUTIONAL: Patient appears well-developed and well-nourished. No distress. HEENT: Head atraumatic, normocephalic, neck supple. CARDIOVASCULAR: Normal rate, regular rhythm and normal heart sounds. No murmur heard. No BLE edema. PULMONARY: Effort normal and breath sounds normal. No wheeze. No respiratory distress. ABDOMINAL: There is no tenderness or distention. MUSCULOSKELETAL: Normal gait. Without gross motor or sensory deficit. PSYCHIATRIC: Patient has a normal mood and affect. Behavior is normal. Judgment and thought content normal.  Vitals:   01/11/24 0815  BP: 118/70  Pulse: 86  Resp: 16  SpO2: 97%  Weight: 174 lb 4.8 oz (79.1 kg)  Height: 5' 3 (1.6 m)    Body mass index is 30.88 kg/m.   PHQ2/9:    01/11/2024    8:11 AM 12/21/2023    1:40 PM 10/31/2023    2:12 PM 10/05/2023    2:40 PM 09/02/2023    2:17 PM  Depression screen PHQ 2/9  Decreased Interest 0 0 0 0 0  Down, Depressed, Hopeless 0 0 0 0 0  PHQ - 2 Score 0 0 0 0 0  Altered sleeping    0   Tired, decreased energy    0   Change in appetite    0   Feeling bad or failure about yourself     0   Trouble concentrating    0   Moving slowly or fidgety/restless    0   Suicidal thoughts    0   PHQ-9 Score    0   Difficult doing work/chores    Not difficult at all     phq 9 is negative  Fall Risk:    01/11/2024    8:09 AM 10/31/2023    3:35 PM 10/05/2023    2:40 PM 05/30/2023    9:13 AM 05/06/2023    8:21 AM  Fall Risk   Falls in the past year? 0 0 0 0 0  Number falls in past yr: 0 0 0 0 0  Injury with Fall? 0 0 0 0 0  Risk for fall due to : No Fall Risks Medication side effect;Orthopedic patient No Fall Risks No Fall Risks No Fall Risks  Follow up Falls evaluation completed Falls prevention discussed Falls prevention discussed;Education provided;Falls evaluation completed Falls prevention discussed;Education provided;Falls evaluation completed Education provided;Falls prevention  discussed      Assessment & Plan Left  internal carotid artery aneurysm with history of transient ischemic attack Left paraclinoid ICA aneurysm confirmed by MRA, no current complications. Family history of aneurysm rupture. - Call Dr. Jamal office  to ensure MRA of the head is scheduled.  Paroxysmal atrial fibrillation Paroxysmal atrial fibrillation with controlled heart rate. On Eliquis  for anticoagulation. Diltiazem  30 mg prescribed for rhythm control as needed. - Continue Eliquis . - Use diltiazem  30 mg as needed for rhythm control.  Hypertension Hypertension managed with amlodipine . Blood pressure well-controlled at home. - Continue amlodipine  5 mg in the morning and 2.5 mg at night. - Monitor blood pressure at home.  Atherosclerosis of aorta Atherosclerosis of the aorta managed with rosuvastatin . No side effects reported. - Continue rosuvastatin .  Cognitive dysfunction Cognitive dysfunction with forgetfulness. Neurologist evaluation indicated no significant changes. Awareness of memory lapses is positive against dementia. - Encourage memory exercises and staying mentally active.  Obstructive sleep apnea Obstructive sleep apnea with intolerance to CPAP machine.  Mild intermittent asthma Mild intermittent asthma with occasional wheezing. Symbicort  available for flare-ups. - Use Symbicort  160 mcg twice daily for 3-4 days during asthma flare-ups.  Benign paroxysmal positional vertigo Benign paroxysmal positional vertigo with occasional episodes. Promethazine  prescribed for nausea. - Refill promethazine  12.5 mg for nausea as needed.  General Health Maintenance Not regularly taking vitamin D  supplements. - Resume regular vitamin D  supplementation.

## 2024-01-12 ENCOUNTER — Other Ambulatory Visit: Payer: Self-pay | Admitting: Neurology

## 2024-01-12 DIAGNOSIS — H8110 Benign paroxysmal vertigo, unspecified ear: Secondary | ICD-10-CM

## 2024-01-16 ENCOUNTER — Ambulatory Visit
Admission: RE | Admit: 2024-01-16 | Discharge: 2024-01-16 | Disposition: A | Discharge status: Home / Self Care | Source: Ambulatory Visit | Attending: Neurology | Admitting: Neurology

## 2024-01-16 DIAGNOSIS — I671 Cerebral aneurysm, nonruptured: Secondary | ICD-10-CM | POA: Diagnosis not present

## 2024-01-16 DIAGNOSIS — H8110 Benign paroxysmal vertigo, unspecified ear: Secondary | ICD-10-CM | POA: Diagnosis present

## 2024-01-17 ENCOUNTER — Other Ambulatory Visit: Payer: Self-pay

## 2024-01-17 NOTE — Patient Outreach (Signed)
 Complex Care Management   Visit Note  01/17/2024  Name:  [name] MRN: [id number] DOB: 12/20/1940  Situation: Referral received for Complex Care Management related to {Criteria:32550} I obtained verbal consent from {CHL AMB Patient/Caregiver:28184}.  Visit completed with ***  {VISIT LOCATION:32553}  Background:   Past Medical History:  Diagnosis Date   History of stress test    a. 02/2013 Nl stress test.   Hypertension    Insomnia, unspecified    Mitral regurgitation    a. echo 01/2016: nl LV sys fxn, mild MR, w/o pulm htn, nl atrial size   Osteoporosis, unspecified    Paroxysmal atrial fibrillation (HCC) 01/2016   a. diagnosed 01/2016; b. has been on eliquis , pradaxa , and xarelto  at varying times 2/2 cost of each medication-->currently on eliquis  (11/2016)   Pure hypercholesterolemia    Sleep apnea    Unable to tolerate nocturnal PAP therapy   TIA (transient ischemic attack)    Unspecified asthma(493.90)    Vertigo     Assessment: Patient Reported Symptoms:  Cognitive        Neurological      HEENT        Cardiovascular      Respiratory      Endocrine      Gastrointestinal        Genitourinary      Integumentary      Musculoskeletal          Psychosocial              01/11/2024    8:11 AM  Depression screen PHQ 2/9  Decreased Interest 0  Down, Depressed, Hopeless 0  PHQ - 2 Score 0    There were no vitals filed for this visit.  Medications Reviewed Today     Reviewed by Karoline Lima, RN (Registered Nurse) on 01/17/24 at 1237  Med List Status: <None>   Medication Order Taking? Sig Documenting Provider Last Dose Status Informant  acetaminophen  (TYLENOL ) 650 MG CR tablet [id number]  Take 650 mg by mouth every 8 (eight) hours as needed for pain. [provider]  Active Self  albuterol  (VENTOLIN  HFA) 108 (90 Base) MCG/ACT inhaler [id number]  Inhale 2 puffs into the lungs every 6 (six) hours as needed for wheezing or shortness of  breath. Sowles, Krichna, MD  Active   amLODipine  (NORVASC ) 2.5 MG tablet [id number]  Take 1-2 tablets (2.5-5 mg total) by mouth in the morning and at bedtime. 2 tablets in am and one in pm Sowles, Krichna, MD  Active   apixaban  (ELIQUIS ) 5 MG TABS tablet [id number]  TAKE 1 TABLET(5 MG) BY MOUTH TWICE DAILY Darron Deatrice LABOR, MD  Active   budesonide -formoterol  (SYMBICORT ) 160-4.5 MCG/ACT inhaler [id number]  Inhale 2 puffs into the lungs 2 (two) times daily. Sowles, Krichna, MD  Active   Cholecalciferol (VITAMIN D ) 50 MCG (2000 UT) CAPS [id number]  Take 1 capsule by mouth daily at 12 noon. [provider]  Active   diltiazem  (CARDIZEM ) 30 MG tablet [id number]  Take 1 tablet (30 mg total) by mouth every 6 (six) hours as needed (for tachycardia/recurrent Afib.). Vivienne Lonni Ingle, NP  Active Self           Med Note NORVEL KIRSCH   Thu Nov 19, 2021  9:25 AM)    loratadine  (CLARITIN ) 10 MG tablet [id number]  Take 1 tablet (10 mg total) by mouth at bedtime. Tapia, Leisa, PA-C  Active   promethazine  (PHENERGAN ) 12.5 MG tablet [id number]  Take  0.5 tablets (6.25 mg total) by mouth every 6 (six) hours as needed for up to 5 days for nausea or vomiting. Sowles, Krichna, MD  Expired 01/16/24 2359   rosuvastatin  (CRESTOR ) 10 MG tablet [id number]  Take 1 tablet (10 mg total) by mouth daily. Sowles, Krichna, MD  Active             Recommendation:   {RECOMMENDATONS:32554}  Follow Up Plan:   {FOLLOWUP:32559}  SIG ***

## 2024-01-17 NOTE — Patient Instructions (Signed)
 Thank you for allowing the Complex Care Management team to participate in your care. It was great speaking with you!  We will follow up on February 14, 2024 at 1230. Please do not hesitate to contact me if you require assistance prior to our next outreach.    Jackson Acron [facility] Population Health RN Care Manager Direct Dial: [phone]  Fax: [phone] Website: [email or web address]

## 2024-02-06 ENCOUNTER — Other Ambulatory Visit: Payer: Self-pay

## 2024-02-06 DIAGNOSIS — I480 Paroxysmal atrial fibrillation: Secondary | ICD-10-CM

## 2024-02-06 MED ORDER — APIXABAN 5 MG PO TABS: ORAL_TABLET | ORAL | 5 refills | Status: AC

## 2024-02-06 NOTE — Telephone Encounter (Signed)
 Prescription refill request for Eliquis  received. Indication: Afib  Last office visit: 08/25/23 Marsa)  Scr: 0.83 (10/05/23)  Age: 83 Weight: 79.1kg  Appropriate dose. Refill sent.

## 2024-02-14 ENCOUNTER — Other Ambulatory Visit

## 2024-02-14 ENCOUNTER — Telehealth

## 2024-02-14 NOTE — Patient Outreach (Signed)
 Complex Care Management   Visit Note  02/14/2024  Name:  [name] MRN: [id number] DOB: 12/04/1940  Situation: Referral received for Complex Care Management related to Diabetes and Atrial Fibrillation I obtained verbal consent from Patient.  Visit completed with [name] via telephone.  Background:   Past Medical History:  Diagnosis Date   History of stress test    a. 02/2013 Nl stress test.   Hypertension    Insomnia, unspecified    Mitral regurgitation    a. echo 01/2016: nl LV sys fxn, mild MR, w/o pulm htn, nl atrial size   Osteoporosis, unspecified    Paroxysmal atrial fibrillation (HCC) 01/2016   a. diagnosed 01/2016; b. has been on eliquis , pradaxa , and xarelto  at varying times 2/2 cost of each medication-->currently on eliquis  (11/2016)   Pure hypercholesterolemia    Sleep apnea    Unable to tolerate nocturnal PAP therapy   TIA (transient ischemic attack)    Unspecified asthma(493.90)    Vertigo        Assessment: Patient Reported Symptoms: Cognitive Cognitive Status: Alert and oriented to person, place, and time, Normal speech and language skills Cognitive/Intellectual Conditions Management [RPT]: None reported or documented in medical history or problem list Health Maintenance Behaviors: Annual physical exam, Social activities Healing Pattern: Average Health Facilitated by: Rest, Healthy diet  Neurological Neurological Review of Symptoms: No symptoms reported Neurological Management Strategies: Medical device, Medication therapy, Routine screening Neurological Self-Management Outcome: 4 (good)  HEENT HEENT Symptoms Reported: No symptoms reported HEENT Management Strategies: Medication therapy, Routine screening HEENT Self-Management Outcome: 4 (good)  Cardiovascular Cardiovascular Symptoms Reported: No symptoms reported Does patient have uncontrolled Hypertension?: No Cardiovascular Self-Management Outcome: 5 (very good)  Respiratory Respiratory  Symptoms Reported: No symptoms reported Respiratory Self-Management Outcome: 5 (very good)  Endocrine Endocrine Symptoms Reported: No symptoms reported Is patient diabetic?: No Endocrine Self-Management Outcome: 4 (good)  Gastrointestinal Gastrointestinal Symptoms Reported: No symptoms reported Gastrointestinal Management Strategies: Medication therapy Gastrointestinal Self-Management Outcome: 4 (good)  Genitourinary Genitourinary Symptoms Reported: No symptoms reported Genitourinary Self-Management Outcome: 4 (good)  Integumentary Integumentary Symptoms Reported: No symptoms reported Skin Management Strategies: Routine screening Skin Self-Management Outcome: 4 (good)  Musculoskeletal Musculoskelatal Symptoms Reviewed: No symptoms reported Musculoskeletal Management Strategies: Routine screening, Coping strategies, Medication therapy Musculoskeletal Self-Management Outcome: 4 (good)  Psychosocial Psychosocial Symptoms Reported: No symptoms reported Behavioral Health Self-Management Outcome: 5 (very good) Major Change/Loss/Stressor/Fears (CP): Denies Quality of Family Relationships: supportive, helpful Do you feel physically threatened by others?: No      There were no vitals filed for this visit.  Medications Reviewed Today     Reviewed by [name] (Registered Nurse) on 02/14/24 at 1244  Med List Status: <None>   Medication Order Taking? Sig Documenting Provider Last Dose Status Informant  acetaminophen  (TYLENOL ) 650 MG CR tablet [id number]  Take 650 mg by mouth every 8 (eight) hours as needed for pain. [provider]  Active Self  albuterol  (VENTOLIN  HFA) 108 (90 Base) MCG/ACT inhaler [id number]  Inhale 2 puffs into the lungs every 6 (six) hours as needed for wheezing or shortness of breath. Sowles, Krichna, MD  Active   amLODipine  (NORVASC ) 2.5 MG tablet [id number]  Take 1-2 tablets (2.5-5 mg total) by mouth in the morning and at bedtime. 2 tablets in am and one  in pm Sowles, Krichna, MD  Active   apixaban  (ELIQUIS ) 5 MG TABS tablet [id number]  TAKE 1 TABLET(5 MG) BY MOUTH TWICE DAILY Kristin Deatrice LABOR, MD  Active  budesonide -formoterol  (SYMBICORT ) 160-4.5 MCG/ACT inhaler [id number]  Inhale 2 puffs into the lungs 2 (two) times daily. Sowles, Krichna, MD  Active   Cholecalciferol (VITAMIN D ) 50 MCG (2000 UT) CAPS [id number]  Take 1 capsule by mouth daily at 12 noon. [provider]  Active   diltiazem  (CARDIZEM ) 30 MG tablet [id number]  Take 1 tablet (30 mg total) by mouth every 6 (six) hours as needed (for tachycardia/recurrent Afib.). [name]  Active Self           Med Note NORVEL KIRSCH   Thu Nov 19, 2021  9:25 AM)    loratadine  (CLARITIN ) 10 MG tablet [id number]  Take 1 tablet (10 mg total) by mouth at bedtime. Tapia, [name]  Active   promethazine  (PHENERGAN ) 12.5 MG tablet [id number]  Take 0.5 tablets (6.25 mg total) by mouth every 6 (six) hours as needed for up to 5 days for nausea or vomiting. Sowles, Krichna, MD  Expired 01/16/24 2359   rosuvastatin  (CRESTOR ) 10 MG tablet [id number]  Take 1 tablet (10 mg total) by mouth daily. Sowles, Krichna, MD  Active             Recommendation:   Continue Current Plan of Care  Follow Up Plan:   Telephone follow up appointment with Nurse Case Manager on March 19, 2024   [facility] Care Manager Direct Dial: [phone]  Fax: [phone] Website: [email or web address]

## 2024-02-17 NOTE — Patient Instructions (Signed)
 Thank you for allowing the Complex Care Management team to participate in your care. It was great speaking with you.  Keep up the great work managing your care!  We will follow up on March 19, 2024 at 3 pm. Please do not hesitate to contact me if you require assistance.   Jackson Acron [facility] Population Health RN Care Manager Direct Dial: [phone]  Fax: [phone] Website: [email or web address]

## 2024-02-24 ENCOUNTER — Ambulatory Visit: Payer: Self-pay

## 2024-02-24 ENCOUNTER — Ambulatory Visit (INDEPENDENT_AMBULATORY_CARE_PROVIDER_SITE_OTHER)

## 2024-02-24 VITALS — BP 126/90 | HR 53 | Ht 63.0 in | Wt 173.2 lb

## 2024-02-24 DIAGNOSIS — K625 Hemorrhage of anus and rectum: Secondary | ICD-10-CM | POA: Diagnosis not present

## 2024-02-24 NOTE — Progress Notes (Signed)
     Acute Patient Visit  Physician: Billi Bright A Zeniyah Peaster, MD  Patient: [name] MRN: [id number] DOB: 1941/05/28 PCP: Sowles, Krichna, MD     Subjective:  No chief complaint on file.    HPI: The patient is a 83 y.o. female who presents today for:   Discussed the use of AI scribe software for clinical note transcription with the patient, who gave verbal consent to proceed.  History of Present Illness                ROS:   As noted in the HPI    ASSESMENT/PLAN:  No diagnosis found.  No orders of the defined types were placed in this encounter.   Assessment and Plan                 OBJECTIVE: There were no vitals filed for this visit.  There is no height or weight on file to calculate BMI.   Physical Exam Vitals reviewed.  Constitutional:      Appearance: Normal appearance. Well-developed with normal weight.  Cardiovascular:     Rate and Rhythm: Normal rate and regular rhythm. Normal heart sounds. Normal peripheral pulses Pulmonary:     Normal breath sounds with normal effort Skin:    General: Skin is warm and dry without noticeable rash. Neurological:     General: No focal deficit present.  Psychiatric:        Mood and Affect: Mood, behavior and cognition normal       Allergies Patient is allergic to ace inhibitors and penicillins.  Past Medical History Patient  has a past medical history of History of stress test, Hypertension, Insomnia, unspecified, Mitral regurgitation, Osteoporosis, unspecified, Paroxysmal atrial fibrillation (HCC) (01/2016), Pure hypercholesterolemia, Sleep apnea, TIA (transient ischemic attack), Unspecified asthma(493.90), and Vertigo.  Surgical History Patient  has a past surgical history that includes Abdominal hysterectomy; Colonoscopy with propofol  (N/A, 03/04/2015); Cataract extraction (Bilateral, 08/31/2016); Colonoscopy with propofol  (N/A, 04/01/2020); Diagnostic laparoscopy; Knee arthroscopy (Right,  10/08/2020); Knee arthroscopy (Right, 10/21/2020); and Knee Arthroplasty (Right, 08/14/2021).  Family History Pateint's family history includes Aneurysm in her mother; COPD in her father; Cancer in her brother; Healthy in her brother; Hypertension in her brother and mother; Stroke in her brother.  Social History Patient  reports that she has never smoked. She has never used smokeless tobacco. She reports that she does not drink alcohol and does not use drugs.    02/24/2024

## 2024-02-24 NOTE — Progress Notes (Signed)
 Acute Patient Visit  Physician: Tayt Moyers A Horacio Werth, MD  Patient: [name] MRN: [id number] DOB: [birth year] PCP: Sowles, Krichna, MD     Subjective:   Chief Complaint  Patient presents with   Acute Visit    Concerned about blood in her stool. Patient stated  she noticed  it yesterday.     HPI: The patient is a 83 y.o. female who presents today for:   Discussed the use of AI scribe software for clinical note transcription with the patient, who gave verbal consent to proceed.  History of Present Illness   [name] is an 82 year old female with atrial fibrillation who presents with possible blood in her stool. She is accompanied by a family member who used to work at the hospital.  Hematochezia - Blood in stool first noticed yesterday - Maroon in color, located along the side of the stool - Blood confirmed by wiping it on the side of the commode - No associated dizziness, lightheadedness, shortness of breath, or abdominal pain. VSS - No discomfort with bowel movements except for a brief episode that resolved - No changes in bowel habits - No discomfort while eating  Anticoagulation therapy - Currently taking Eliquis  for atrial fibrillation - history of stroke/TIA  Gastrointestinal history - History of intestinal polyps - History of benign intestinal tumor - Uncertain of last colonoscopy date but believes she is up to date - seems like 2011  Recent laboratory evaluation - Last laboratory work performed in April - normal Hg     ROS:   As noted in the HPI    ASSESMENT/PLAN:  Encounter Diagnoses  Name Primary?   Rectal bleeding Yes   Rectal bleed     Orders Placed This Encounter  Procedures   CBC with Differential/Platelet   Comprehensive metabolic panel with GFR   Fecal Globin By Immunochemistry    Assessment and Plan    Rectal bleeding  Differential diagnosis includes bleeding from polyps, hemorrhoids, or diverticulitis.  Hemodynamically stable with no active bleeding or significant symptoms. Current bleeding is not severe enough to warrant immediate cessation of Eliquis . - Order complete blood count to assess hemoglobin levels and check for anemia. Stat report requested for today - Provide fecal occult blood test kit for home use and return for analysis today if possible - Instruct to monitor for increased bleeding, bright red stools, or new stomach pain. If these occur, stop Eliquis  and seek emergency care. - Advise to watch for symptoms such as dizziness, lightheadedness, shortness of breath, or chest pain. - Follow up with primary care physician next week to discuss further management, including potential colonoscopy.  Atrial fibrillation on anticoagulation Atrial fibrillation managed with Eliquis . Discussed risk of stopping Eliquis  due to potential stroke risk versus risk of continued bleeding.        OBJECTIVE: Vitals:   02/24/24 0820  BP: (!) 126/90  Pulse: (!) 53  SpO2: 97%  Weight: 173 lb 4 oz (78.6 kg)  Height: 5' 3 (1.6 m)    Body mass index is 30.69 kg/m.   Physical Exam Vitals reviewed.  Constitutional:      Appearance: Normal appearance. Well-developed with normal weight.  Cardiovascular:     Rate and Rhythm: Normal rate and regular rhythm. Normal heart sounds. Normal peripheral pulses Pulmonary:     Normal breath sounds with normal effort Skin:    General: Skin is warm and dry without noticeable rash. Neurological:     General: No  focal deficit present.  Psychiatric:        Mood and Affect: Mood, behavior and cognition normal  . Abdomen:  Soft non-tender, ND      Allergies Patient is allergic to ace inhibitors and penicillins.  Past Medical History Patient  has a past medical history of History of stress test, Hypertension, Insomnia, unspecified, Mitral regurgitation, Osteoporosis, unspecified, Paroxysmal atrial fibrillation (HCC) (01/2016), Pure hypercholesterolemia,  Sleep apnea, TIA (transient ischemic attack), Unspecified asthma(493.90), and Vertigo.  Surgical History Patient  has a past surgical history that includes Abdominal hysterectomy; Colonoscopy with propofol  (N/A, 03/04/2015); Cataract extraction (Bilateral, 08/31/2016); Colonoscopy with propofol  (N/A, 04/01/2020); Diagnostic laparoscopy; Knee arthroscopy (Right, 10/08/2020); Knee arthroscopy (Right, 10/21/2020); and Knee Arthroplasty (Right, 08/14/2021).  Family History Pateint's family history includes Aneurysm in her mother; COPD in her father; Cancer in her brother; Healthy in her brother; Hypertension in her brother and mother; Stroke in her brother.  Social History Patient  reports that she has never smoked. She has never used smokeless tobacco. She reports that she does not drink alcohol and does not use drugs.    02/24/2024

## 2024-02-26 LAB — CBC WITH DIFFERENTIAL/PLATELET
Absolute Lymphocytes: 1257 {cells}/uL (ref 850–3900)
Absolute Monocytes: 592 {cells}/uL (ref 200–950)
Basophils Absolute: 31 {cells}/uL (ref 0–200)
Basophils Relative: 0.5 %
Eosinophils Absolute: 92 {cells}/uL (ref 15–500)
Eosinophils Relative: 1.5 %
HCT: 44 % (ref 35.0–45.0)
Hemoglobin: 14.8 g/dL (ref 11.7–15.5)
MCH: 31 pg (ref 27.0–33.0)
MCHC: 33.6 g/dL (ref 32.0–36.0)
MCV: 92.1 fL (ref 80.0–100.0)
MPV: 11.6 fL (ref 7.5–12.5)
Monocytes Relative: 9.7 %
Neutro Abs: 4130 {cells}/uL (ref 1500–7800)
Neutrophils Relative %: 67.7 %
Platelets: 320 10*3/uL (ref 140–400)
RBC: 4.78 Million/uL (ref 3.80–5.10)
RDW: 12.9 % (ref 11.0–15.0)
Total Lymphocyte: 20.6 %
WBC: 6.1 10*3/uL (ref 3.8–10.8)

## 2024-02-26 LAB — COMPREHENSIVE METABOLIC PANEL WITH GFR
AG Ratio: 1.3 (calc) (ref 1.0–2.5)
ALT: 20 U/L (ref 6–29)
AST: 19 U/L (ref 10–35)
Albumin: 4.4 g/dL (ref 3.6–5.1)
Alkaline phosphatase (APISO): 65 U/L (ref 37–153)
BUN: 19 mg/dL (ref 7–25)
CO2: 28 mmol/L (ref 20–32)
Calcium: 10.1 mg/dL (ref 8.6–10.4)
Chloride: 104 mmol/L (ref 98–110)
Creat: 0.95 mg/dL (ref 0.60–0.95)
Globulin: 3.5 g/dL (ref 1.9–3.7)
Glucose, Bld: 109 mg/dL — ABNORMAL HIGH (ref 65–99)
Potassium: 5 mmol/L (ref 3.5–5.3)
Sodium: 138 mmol/L (ref 135–146)
Total Bilirubin: 0.5 mg/dL (ref 0.2–1.2)
Total Protein: 7.9 g/dL (ref 6.1–8.1)
eGFR: 60 mL/min/{1.73_m2}

## 2024-02-27 DIAGNOSIS — K625 Hemorrhage of anus and rectum: Secondary | ICD-10-CM | POA: Diagnosis not present

## 2024-02-28 DIAGNOSIS — M1712 Unilateral primary osteoarthritis, left knee: Secondary | ICD-10-CM | POA: Diagnosis not present

## 2024-02-28 NOTE — Progress Notes (Signed)
 " Chief Complaint No chief complaint on file.    Reason for Visit  [name] is a 83 y.o. who presents today for their first Gelsyn-3 injection to the left knee. Has seen some improvement in their condition. Had no complications or reaction to the other viscosupplimentation injection. Still reports some pain to the medial aspect of the knee.  She is hopeful that these injections will provide some relief for her underlying discomfort and condition.  Medications Current Outpatient Medications  Medication Sig Dispense Refill   acetaminophen  (TYLENOL ) 325 MG tablet Take 650 mg by mouth at bedtime as needed for Pain     amLODIPine  (NORVASC ) 5 MG tablet Take 5 mg by mouth once daily     cloNIDine  HCl (CATAPRES ) 0.1 MG tablet 0.1 mg 2 (two) times daily as needed        ELIQUIS  5 mg tablet Take 5 mg by mouth 2 (two) times daily    3   meclizine  (ANTIVERT ) 25 mg tablet Take 25 mg by mouth 3 (three) times daily as needed for Dizziness     promethazine  (PHENERGAN ) 12.5 MG tablet Take 12.5 mg by mouth every 6 (six) hours as needed for Nausea     rosuvastatin  (CRESTOR ) 10 MG tablet Take 10 mg by mouth at bedtime     No current facility-administered medications for this visit.    Allergies Ace inhibitors and Penicillins  Histories Past Medical History: Past Medical History:  Diagnosis Date   Atrial fibrillation (CMS/HHS-HCC)    Hyperlipidemia    Hypertension    Stroke (CMS/HHS-HCC)    TIA    Past Surgical History: Past Surgical History:  Procedure Laterality Date   Right knee arthroscopy, partial medial meniscectomy, and chondroplasty  10/08/2020   Dr Mardee   Right knee arthroscopy, arthroscopic irrigation and debridement of the right knee  10/21/2020   Dr Mardee   Right total knee arthroplasty using computer-assisted navigation  08/14/2021   Dr Mardee   CARPAL TUNNEL RELEASE     HYSTERECTOMY      Social History: Social History   Socioeconomic History    Marital status: Divorced   Number of children: 2   Years of education: 14   Highest education level: Associate degree: academic program  Occupational History   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  Tobacco Use   Smoking status: Never   Smokeless tobacco: Never  Vaping Use   Vaping status: Never Used  Substance and Sexual Activity   Alcohol use: Never   Drug use: Never   Sexual activity: Defer    Partners: Male  Social History Narrative   Education:14 years Associate Degree   Occupation: retired   Marital Status: divorced   Social Drivers of Corporate Investment Banker Strain: Low Risk  (10/31/2023)   Received from [facility]   Overall Financial Resource Strain (CARDIA)    Difficulty of Paying Living Expenses: Not very hard  Food Insecurity: No Food Insecurity (10/31/2023)   Received from [facility]   Hunger Vital Sign    Within the past 12 months, you worried that your food would run out before you got the money to buy more.: Never true    Within the past 12 months, the food you bought just didn't last and you didn't have money to get more.: Never true  Transportation Needs: No Transportation Needs (10/31/2023)   Received from [facility] - Transportation    Lack of Transportation (Medical): No  Lack of Transportation (Non-Medical): No  Physical Activity: Insufficiently Active (05/06/2023)   Received from [facility]   Exercise Vital Sign    On average, how many days per week do you engage in moderate to strenuous exercise (like a brisk walk)?: 3 days    On average, how many minutes do you engage in exercise at this level?: 30 min  Stress: No Stress Concern Present (10/31/2023)   Received from [facility] - Occupational Stress Questionnaire    Feeling of Stress : Not at all  Social Connections: Moderately Integrated (10/31/2023)   Received from [facility]   Social Connection and  Isolation Panel    In a typical week, how many times do you talk on the phone with family, friends, or neighbors?: More than three times a week    How often do you get together with friends or relatives?: More than three times a week    How often do you attend church or religious services?: More than 4 times per year    Do you belong to any clubs or organizations such as church groups, unions, fraternal or athletic groups, or school groups?: Yes    How often do you attend meetings of the clubs or organizations you belong to?: Never    Are you married, widowed, divorced, separated, never married, or living with a partner?: Divorced  Housing Stability: Unknown (09/02/2023)   Received from [facility]   Housing Stability Vital Sign    Unable to Pay for Housing in the Last Year: No    Homeless in the Last Year: No    Family History: Family History  Problem Relation Name Age of Onset   Aneurysm Mother     Cancer Brother     High blood pressure (Hypertension) Daughter     Migraines Daughter      Review of Systems A comprehensive 14 point ROS was performed, reviewed, and the pertinent orthopaedic findings are documented in the HPI.  Exam  There were no vitals taken for this visit.  Left lower Extremity:  Examination of the left knee revealed no effusion or erythema.  There was no skin warmth.  There is no bony abnormality.  The patient has made joint line pain.  The range of motion is unchanged.  There is no instability of the joint.    Impression Degenerative arthrosis of left knee  Plan  1. I discussed the patient's physical exam findings with the patient. 2.  The first injection of Gelsyn was performed into the patient's left knee.  See procedural note for details 3.  Encouraged the patient to take it easy over the next 24 hours, and counseled her on the postinjection course 4.  Patient will plan to follow-up in 1 week's time for her second round of Gelsyn to be  performed into her left knee.   Left Knee Gelsyn Injection Procedure:  The risks, benefits and alternatives of the proposed injection were discussed with patient.  The patient states that she understands these and she verbally consented to proceed.  The patient's left  Anterolateral region was cleaned with alcohol solution and then ethylchloride anesthetic spray was utilized. The patient was injected with 3 cc's of 1% lidocaine  for anesthetic effect, using a 25 gauge needle. Using an gauge needle,  2 cc's of Gelsyn was injected into the patient's knee.   Patient tolerated this well without any complications.  She was counseled as to expected post injection  course, including the possibility of temporary worsening of symptoms.  She was instructed as to concerning symptoms or signs, and was  instructed to contact our office if these should appear.   Fonda CHARLENA Gravel, PA-C [facility] "

## 2024-02-29 LAB — FECAL GLOBIN BY IMMUNOCHEMISTRY
FECAL GLOBIN RESULT:: DETECTED — AB
MICRO NUMBER:: 16910180
SPECIMEN QUALITY:: ADEQUATE

## 2024-03-06 ENCOUNTER — Ambulatory Visit (INDEPENDENT_AMBULATORY_CARE_PROVIDER_SITE_OTHER): Admitting: Family Medicine

## 2024-03-06 ENCOUNTER — Encounter: Payer: Self-pay | Admitting: Family Medicine

## 2024-03-06 VITALS — BP 124/76 | HR 70 | Resp 16 | Ht 63.0 in | Wt 174.5 lb

## 2024-03-06 DIAGNOSIS — K625 Hemorrhage of anus and rectum: Secondary | ICD-10-CM

## 2024-03-06 DIAGNOSIS — M1712 Unilateral primary osteoarthritis, left knee: Secondary | ICD-10-CM | POA: Diagnosis not present

## 2024-03-06 NOTE — Progress Notes (Signed)
 Name: [name]   MRN: [id number]    DOB: 07-05-1940   Date:03/06/2024       Progress Note  Subjective  Chief Complaint  Chief Complaint  Patient presents with   Medical Management of Chronic Issues   Discussed the use of AI scribe software for clinical note transcription with the patient, who gave verbal consent to proceed.  History of Present Illness [name] is an 83 year old female with diverticulosis who presents with rectal bleeding.  She has experienced darker streaks in her stool, resembling the color of wine, sinceend of August and seen by another doctor on February 24, 2024. A fecal immunochemical test (FIT) was positive for occult blood. There is no bright red blood in her stool.  Her last colonoscopy in 2021 revealed a polyp, but no hemorrhoids. She has a history of diverticulosis. She is currently on blood thinners.  No abdominal pain, changes in bowel movements, or lack of appetite. She occasionally strains during bowel movements without significant difficulty.  No family history of colon cancer, but her brother had pancreatic cancer.    Patient Active Problem List   Diagnosis Date Noted   Rectal bleed 02/24/2024   Senile purpura (HCC) 05/24/2022   Benign paroxysmal positional vertigo 05/24/2022   Thrombocytosis 11/19/2021   Mixed hyperlipidemia 11/19/2021   Atherosclerosis of aorta (HCC) 11/19/2021   OSA (obstructive sleep apnea) 11/19/2021   Pre-diabetes 11/19/2021   Lumbar stenosis with neurogenic claudication 11/19/2021   Vitamin D  deficiency 11/19/2021   History of total right knee replacement 08/14/2021   Osteoarthritis of right knee 05/05/2020   History of colonic polyps    Polyp of transverse colon    Aneurysm (HCC) 02/08/2018   History of CVA (cerebrovascular accident) without residual deficits 01/24/2018   Aneurysm of ophthalmic artery 01/24/2018   Impingement syndrome of shoulder region 01/02/2018   History of cataract surgery, right  09/01/2016   Paroxysmal atrial fibrillation (HCC) 02/24/2016   Vertigo 02/24/2016   Low HDL (under 40) 02/17/2016   Hx of colonic polyps    Benign neoplasm of sigmoid colon    Mild intermittent asthma without complication 02/21/2015   Hyperglycemia 02/21/2015   Essential hypertension 02/20/2015   Inconclusive mammogram due to dense breasts 01/02/2015   Dense breast 01/02/2015   Abnormal EKG 02/23/2013    Past Surgical History:  Procedure Laterality Date   ABDOMINAL HYSTERECTOMY     CATARACT EXTRACTION Bilateral 08/31/2016   COLONOSCOPY WITH PROPOFOL  N/A 03/04/2015   Procedure: COLONOSCOPY WITH PROPOFOL ;  Surgeon: Rogelia Copping, MD;  Location: [facility];  Service: Endoscopy;  Laterality: N/A;   COLONOSCOPY WITH PROPOFOL  N/A 04/01/2020   Procedure: COLONOSCOPY WITH PROPOFOL ;  Surgeon: Copping Rogelia, MD;  Location: [facility] END[facility]COPY;  Service: Endoscopy;  Laterality: N/A;   DIAGNOSTIC LAPAROSCOPY     REMOVAL OF SCAR TISSUE (ADHESIONS)   KNEE ARTHROPLASTY Right 08/14/2021   Procedure: COMPUTER ASSISTED TOTAL KNEE ARTHROPLASTY - RNFA;  Surgeon: Mardee Lynwood SHAUNNA, MD;  Location: [facility] ORS;  Service: Orthopedics;  Laterality: Right;   KNEE ARTHROSCOPY Right 10/08/2020   Procedure: ARTHROSCOPY KNEE;  Surgeon: Mardee Lynwood SHAUNNA, MD;  Location: [facility] OR[facility];  Service: Orthopedics;  Laterality: Right;   KNEE ARTHROSCOPY Right 10/21/2020   Procedure: Arthroscopic irrigation and debridement right knee;  Surgeon: Mardee Lynwood SHAUNNA, MD;  Location: [facility] OR[facility];  Service: Orthopedics;  Laterality: Right;    Family History  Problem Relation Age of Onset   COPD Father    Hypertension Mother  Aneurysm Mother    Hypertension Brother    Stroke Brother    Cancer Brother        pancreatic   Healthy Brother     Social History   Tobacco Use   Smoking status: Never   Smokeless tobacco: Never   Tobacco comments:    smoking cessation materials not required  Substance Use Topics   Alcohol use: No     Alcohol/week: 0.0 standard drinks of alcohol     Current Outpatient Medications:    acetaminophen  (TYLENOL ) 650 MG CR tablet, Take 650 mg by mouth every 8 (eight) hours as needed for pain., Disp: , Rfl:    albuterol  (VENTOLIN  HFA) 108 (90 Base) MCG/ACT inhaler, Inhale 2 puffs into the lungs every 6 (six) hours as needed for wheezing or shortness of breath., Disp: 6.7 g, Rfl: 0   amLODipine  (NORVASC ) 2.5 MG tablet, Take 1-2 tablets (2.5-5 mg total) by mouth in the morning and at bedtime. 2 tablets in am and one in pm, Disp: 270 tablet, Rfl: 1   apixaban  (ELIQUIS ) 5 MG TABS tablet, TAKE 1 TABLET(5 MG) BY MOUTH TWICE DAILY, Disp: 60 tablet, Rfl: 5   budesonide -formoterol  (SYMBICORT ) 160-4.5 MCG/ACT inhaler, Inhale 2 puffs into the lungs 2 (two) times daily., Disp: 10.2 g, Rfl: 0   Cholecalciferol (VITAMIN D ) 50 MCG (2000 UT) CAPS, Take 1 capsule by mouth daily at 12 noon., Disp: , Rfl:    diltiazem  (CARDIZEM ) 30 MG tablet, Take 1 tablet (30 mg total) by mouth every 6 (six) hours as needed (for tachycardia/recurrent Afib.)., Disp: 30 tablet, Rfl: 3   loratadine  (CLARITIN ) 10 MG tablet, Take 1 tablet (10 mg total) by mouth at bedtime., Disp: 30 tablet, Rfl: 1   promethazine  (PHENERGAN ) 12.5 MG tablet, Take 0.5 tablets (6.25 mg total) by mouth every 6 (six) hours as needed for up to 5 days for nausea or vomiting., Disp: 10 tablet, Rfl: 0   rosuvastatin  (CRESTOR ) 10 MG tablet, Take 1 tablet (10 mg total) by mouth daily., Disp: 90 tablet, Rfl: 3  Allergies  Allergen Reactions   Ace Inhibitors Swelling    Patient states her tongue swell.   Penicillins Anxiety    IgE = 251 (WNL) on 07/31/2021  Has patient had a PCN reaction causing immediate rash, facial/tongue/throat swelling, SOB or lightheadedness with hypotension: No Has patient had a PCN reaction causing severe rash involving mucus membranes or skin necrosis: No Has patient had a PCN reaction that required hospitalization No Has patient had a PCN  reaction occurring within the last 10 years: No If all of the above answers are NO, then may proceed with Cephalosporin use.    I personally reviewed active problem list, medication list, allergies, family history with the patient/caregiver today.   ROS  Ten systems reviewed and is negative except as mentioned in HPI    Objective Physical Exam CONSTITUTIONAL: Patient appears well-developed and well-nourished.  No distress. HEENT: Head atraumatic, normocephalic, neck supple. CARDIOVASCULAR: Normal rate, regular rhythm and normal heart sounds.  No murmur heard. No BLE edema. PULMONARY: Effort normal and breath sounds normal. No respiratory distress. ABDOMINAL: There is no tenderness or distention. MUSCULOSKELETAL: Normal gait. Without gross motor or sensory deficit. PSYCHIATRIC: Patient has a normal mood and affect. behavior is normal. Judgment and thought content normal.  Vitals:   03/06/24 1027  BP: 124/76  Pulse: 70  Resp: 16  SpO2: 94%  Weight: 174 lb 8 oz (79.2 kg)  Height: 5' 3 (1.6 m)  Body mass index is 30.91 kg/m.  Recent Results (from the past 2160 hours)  CBC with Differential/Platelet     Status: None   Collection Time: 02/24/24  9:28 AM  Result Value Ref Range   WBC 6.1 3.8 - 10.8 Thousand/uL   RBC 4.78 3.80 - 5.10 Million/uL   Hemoglobin 14.8 11.7 - 15.5 g/dL   HCT 55.9 64.9 - 54.9 %   MCV 92.1 80.0 - 100.0 fL   MCH 31.0 27.0 - 33.0 pg   MCHC 33.6 32.0 - 36.0 g/dL    Comment: For adults, a slight decrease in the calculated MCHC value (in the range of 30 to 32 g/dL) is most likely not clinically significant; however, it should be interpreted with caution in correlation with other red cell parameters and the patient's clinical condition.    RDW 12.9 11.0 - 15.0 %   Platelets 320 140 - 400 Thousand/uL   MPV 11.6 7.5 - 12.5 fL   Neutro Abs 4,130 1,500 - 7,800 cells/uL   Absolute Lymphocytes 1,257 850 - 3,900 cells/uL   Absolute Monocytes 592 200 -  950 cells/uL   Eosinophils Absolute 92 15 - 500 cells/uL   Basophils Absolute 31 0 - 200 cells/uL   Neutrophils Relative % 67.7 %   Total Lymphocyte 20.6 %   Monocytes Relative 9.7 %   Eosinophils Relative 1.5 %   Basophils Relative 0.5 %  Comprehensive metabolic panel with GFR     Status: Abnormal   Collection Time: 02/24/24  9:28 AM  Result Value Ref Range   Glucose, Bld 109 (H) 65 - 99 mg/dL    Comment: .            Fasting reference interval . For someone without known diabetes, a glucose value between 100 and 125 mg/dL is consistent with prediabetes and should be confirmed with a follow-up test. .    BUN 19 7 - 25 mg/dL   Creat 9.04 9.39 - 9.04 mg/dL   eGFR 60 > OR = 60 fO/fpw/8.26f7   BUN/Creatinine Ratio SEE NOTE: 6 - 22 (calc)    Comment:    Not Reported: BUN and Creatinine are within    reference range. .    Sodium 138 135 - 146 mmol/L   Potassium 5.0 3.5 - 5.3 mmol/L   Chloride 104 98 - 110 mmol/L   CO2 28 20 - 32 mmol/L   Calcium  10.1 8.6 - 10.4 mg/dL   Total Protein 7.9 6.1 - 8.1 g/dL   Albumin 4.4 3.6 - 5.1 g/dL   Globulin 3.5 1.9 - 3.7 g/dL (calc)   AG Ratio 1.3 1.0 - 2.5 (calc)   Total Bilirubin 0.5 0.2 - 1.2 mg/dL   Alkaline phosphatase (APISO) 65 37 - 153 U/L   AST 19 10 - 35 U/L   ALT 20 6 - 29 U/L  Fecal Globin By Immunochemistry     Status: Abnormal   Collection Time: 02/27/24 12:00 AM  Result Value Ref Range   MICRO NUMBER: [id number]    SPECIMEN QUALITY: Adequate    Source: INSURE (TM) FOBT TEST CARD    STATUS: FINAL    FECAL GLOBIN RESULT: Detected (A)     Comment: Reference Range:Not Detected   COMMENT:      NOTE: Approved collection includes sample of toilet water adjacent to stool. Other methods of collection such as stool transferred from diaper, bedpan, or commode to toilet water may lead to inaccurate results.    Diabetic Foot Exam:  PHQ2/9:    03/06/2024   10:23 AM 02/14/2024   12:36 PM 01/17/2024   12:35 PM 01/11/2024    8:11  AM 12/21/2023    1:40 PM  Depression screen PHQ 2/9  Decreased Interest 0 0 0 0 0  Down, Depressed, Hopeless 0 0 0 0 0  PHQ - 2 Score 0 0 0 0 0    phq 9 is negative  Fall Risk:    03/06/2024   10:23 AM 01/11/2024    8:09 AM 10/31/2023    3:35 PM 10/05/2023    2:40 PM 05/30/2023    9:13 AM  Fall Risk   Falls in the past year? 0 0 0 0 0  Number falls in past yr: 0 0 0 0 0  Injury with Fall? 0 0 0 0 0  Risk for fall due to : No Fall Risks No Fall Risks Medication side effect;Orthopedic patient No Fall Risks No Fall Risks  Follow up Falls evaluation completed Falls evaluation completed Falls prevention discussed Falls prevention discussed;Education provided;Falls evaluation completed Falls prevention discussed;Education provided;Falls evaluation completed      Assessment & Plan Rectal bleeding Intermittent rectal bleeding with positive fecal occult blood test. Differential includes diverticular bleeding,  polyps, or hemorrhoids, especially considering blood thinner use. - Refer to Dr. Jinny  for further evaluation and management. - Ensure follow-up with Dr. Jinny  within two weeks. If no contact is made, initiate follow-up.

## 2024-03-12 ENCOUNTER — Other Ambulatory Visit: Payer: Self-pay | Admitting: Family Medicine

## 2024-03-12 DIAGNOSIS — Z1231 Encounter for screening mammogram for malignant neoplasm of breast: Secondary | ICD-10-CM

## 2024-03-13 DIAGNOSIS — M1712 Unilateral primary osteoarthritis, left knee: Secondary | ICD-10-CM | POA: Diagnosis not present

## 2024-03-13 NOTE — Progress Notes (Signed)
 " Chief Complaint Chief Complaint  Patient presents with   Knee Pain    GELSYN-3 #3 LEFT KNEE     Reason for Visit  [name] is a 83 y.o. who presents today for their third Gelsyn-3 injection to the left knee. Has seen some improvement in their condition. Had no complications or reaction to the other viscosupplimentation injection. Still reports some pain to the medial aspect of the knee.  She is hopeful that these injections will provide some relief for her underlying discomfort and condition.  Medications Current Outpatient Medications  Medication Sig Dispense Refill   acetaminophen  (TYLENOL ) 325 MG tablet Take 650 mg by mouth at bedtime as needed for Pain     amLODIPine  (NORVASC ) 2.5 MG tablet Take 2.5-5 mg by mouth 2 (two) times daily  2 tablets in am and one in pm     budesonide -formoteroL  (SYMBICORT ) 160-4.5 mcg/actuation inhaler Inhale 2 inhalations into the lungs 2 (two) times daily     cloNIDine  HCl (CATAPRES ) 0.1 MG tablet 0.1 mg 2 (two) times daily as needed        ELIQUIS  5 mg tablet Take 5 mg by mouth 2 (two) times daily    3   loratadine  (CLARITIN ) 10 mg tablet Take 10 mg by mouth at bedtime     meclizine  (ANTIVERT ) 25 mg tablet Take 25 mg by mouth 3 (three) times daily as needed for Dizziness     promethazine  (PHENERGAN ) 12.5 MG tablet Take 12.5 mg by mouth every 6 (six) hours as needed for Nausea     rosuvastatin  (CRESTOR ) 10 MG tablet Take 10 mg by mouth at bedtime     amLODIPine  (NORVASC ) 5 MG tablet Take 5 mg by mouth once daily (Patient not taking: Reported on 03/13/2024)     No current facility-administered medications for this visit.    Allergies Ace inhibitors and Penicillins  Histories Past Medical History: Past Medical History:  Diagnosis Date   Atrial fibrillation (CMS/HHS-HCC)    Hyperlipidemia    Hypertension    Stroke (CMS/HHS-HCC)    TIA    Past Surgical History: Past Surgical History:  Procedure Laterality Date   Right  knee arthroscopy, partial medial meniscectomy, and chondroplasty  10/08/2020   Dr Mardee   Right knee arthroscopy, arthroscopic irrigation and debridement of the right knee  10/21/2020   Dr Mardee   Right total knee arthroplasty using computer-assisted navigation  08/14/2021   Dr Mardee   CARPAL TUNNEL RELEASE     HYSTERECTOMY      Social History: Social History   Socioeconomic History   Marital status: Divorced   Number of children: 2   Years of education: 14   Highest education level: Associate degree: academic program  Occupational History   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  Tobacco Use   Smoking status: Never   Smokeless tobacco: Never  Vaping Use   Vaping status: Never Used  Substance and Sexual Activity   Alcohol use: Never   Drug use: Never   Sexual activity: Defer    Partners: Male  Social History Narrative   Education:14 years Associate Degree   Occupation: retired   Marital Status: divorced   Social Drivers of Corporate Investment Banker Strain: Low Risk  (10/31/2023)   Received from [facility]   Overall Financial Resource Strain (CARDIA)    Difficulty of Paying Living Expenses: Not very hard  Food Insecurity: No Food Insecurity (10/31/2023)   Received from [facility]  Hunger Vital Sign    Within the past 12 months, you worried that your food would run out before you got the money to buy more.: Never true    Within the past 12 months, the food you bought just didn't last and you didn't have money to get more.: Never true  Transportation Needs: No Transportation Needs (10/31/2023)   Received from [facility] - Transportation    Lack of Transportation (Medical): No    Lack of Transportation (Non-Medical): No  Physical Activity: Insufficiently Active (05/06/2023)   Received from [facility]   Exercise Vital Sign    On average, how many days per week do you engage in moderate to strenuous exercise (like a  brisk walk)?: 3 days    On average, how many minutes do you engage in exercise at this level?: 30 min  Stress: No Stress Concern Present (10/31/2023)   Received from [facility] - Occupational Stress Questionnaire    Feeling of Stress : Not at all  Social Connections: Moderately Integrated (10/31/2023)   Received from [facility]   Social Connection and Isolation Panel    In a typical week, how many times do you talk on the phone with family, friends, or neighbors?: More than three times a week    How often do you get together with friends or relatives?: More than three times a week    How often do you attend church or religious services?: More than 4 times per year    Do you belong to any clubs or organizations such as church groups, unions, fraternal or athletic groups, or school groups?: Yes    How often do you attend meetings of the clubs or organizations you belong to?: Never    Are you married, widowed, divorced, separated, never married, or living with a partner?: Divorced  Housing Stability: Unknown (09/02/2023)   Received from [facility]   Housing Stability Vital Sign    Unable to Pay for Housing in the Last Year: No    Homeless in the Last Year: No    Family History: Family History  Problem Relation Name Age of Onset   Aneurysm Mother     Cancer Brother     High blood pressure (Hypertension) Daughter     Migraines Daughter      Review of Systems A comprehensive 14 point ROS was performed, reviewed, and the pertinent orthopaedic findings are documented in the HPI.  Exam  BP 120/82 (BP Location: Left upper arm, Patient Position: Sitting, BP Cuff Size: Adult)   Ht 160 cm (5' 3)   Wt 78.5 kg (173 lb)   BMI 30.65 kg/m   Left lower Extremity:  Examination of the left knee revealed no effusion or erythema.  There was no skin warmth.  There is no bony abnormality.  The patient has made joint line pain.  The range of  motion is unchanged.  There is no instability of the joint.    Impression Degenerative arthrosis of left knee  Plan  1. I discussed the patient's physical exam findings with the patient. 2.  The third injection of Gelsyn was performed into the patient's left knee.  See procedural note for details 3.  Encouraged the patient to take it easy over the next 24 hours, and counseled her on the postinjection course 4.  Patient will plan to follow-up as needed   Joshua E. Pugilese, PA-C [facility] "

## 2024-03-13 NOTE — Progress Notes (Signed)
Review of Systems   Constitutional: Negative.    HENT: Negative.    Eyes: Negative.    Respiratory: Negative.    Cardiovascular: Negative.    Gastrointestinal: Negative.    Endocrine: Negative.    Genitourinary: Negative.    Musculoskeletal: Positive for myalgias.   Skin: Negative.    Allergic/Immunologic: Positive for environmental allergies.   Neurological: Negative.    Hematological: Negative.    Psychiatric/Behavioral: Negative.

## 2024-03-13 NOTE — Progress Notes (Signed)
 Large Joint Injection: L knee  Date/Time: 03/13/2024 9:30 AM  Performed by: Drake Fonda Loving, PA Authorized by: Drake Fonda Loving, PA   Location:  Knee Site:  L knee Medications:  3 mL lidocaine  1 %; 16.8 mg sodium hyaluronate (viscosup) 16.8 mg/2 mL   Left Knee Gelsyn Injection Procedure:   The risks, benefits and alternatives of the proposed injection were discussed with patient.  The patient states that she understands these and she verbally consented to proceed.  The patient's left  Anterolateral region was cleaned with alcohol solution and then ethylchloride anesthetic spray was utilized. The patient was injected with 3 cc's of 1% lidocaine  for anesthetic effect, using a 25 gauge needle. Using an 18 gauge needle,  2 cc's of Gelsyn was injected into the patient's knee.    Patient tolerated this well without any complications.  She was counseled as to expected post injection course, including the possibility of temporary worsening of symptoms.  She was instructed as to concerning symptoms or signs, and was  instructed to contact our office if these should appear.

## 2024-03-19 ENCOUNTER — Telehealth: Payer: Self-pay

## 2024-03-22 ENCOUNTER — Other Ambulatory Visit: Payer: Self-pay

## 2024-03-26 NOTE — Patient Outreach (Signed)
 Complex Care Management   Visit Note    Name:  [name] MRN: [id number] DOB: 05/21/1941  Situation: Referral received for Complex Care Management related to Atrial Fibrillation and Hypertension. I obtained verbal consent from Patient.  Visit completed with [name] via telephone.  Background:   Past Medical History:  Diagnosis Date   History of stress test    a. 02/2013 Nl stress test.   Hypertension    Insomnia, unspecified    Mitral regurgitation    a. echo 01/2016: nl LV sys fxn, mild MR, w/o pulm htn, nl atrial size   Osteoporosis, unspecified    Paroxysmal atrial fibrillation (HCC) 01/2016   a. diagnosed 01/2016; b. has been on eliquis , pradaxa , and xarelto  at varying times 2/2 cost of each medication-->currently on eliquis  (11/2016)   Pure hypercholesterolemia    Sleep apnea    Unable to tolerate nocturnal PAP therapy   TIA (transient ischemic attack)    Unspecified asthma(493.90)    Vertigo     Assessment: Patient Reported Symptoms: Cognitive Cognitive Status: Alert and oriented to person, place, and time, Normal speech and language skills Cognitive/Intellectual Conditions Management [RPT]: None reported or documented in medical history or problem list Health Maintenance Behaviors: Annual physical exam, Social activities Healing Pattern: Average Health Facilitated by: Rest, Healthy diet  Neurological Neurological Review of Symptoms: No symptoms reported  HEENT HEENT Symptoms Reported: No symptoms reported  Cardiovascular Cardiovascular Symptoms Reported: No symptoms reported Does patient have uncontrolled Hypertension?: No  Respiratory Respiratory Symptoms Reported: No symptoms reported Respiratory Self-Management Outcome: 5 (very good)  Endocrine Endocrine Symptoms Reported: No symptoms reported Is patient diabetic?: No  Gastrointestinal Gastrointestinal Symptoms Reported: Other Other Gastrointestinal Symptoms: Pending evaluation by the Gastroenterology  team for positive fecal immunochemical test. Thorough review of symptoms and indications for seeking immediate  medical attention  Genitourinary Genitourinary Symptoms Reported: No symptoms reported Genitourinary Self-Management Outcome: 4 (good)  Integumentary Integumentary Symptoms Reported: No symptoms reported Skin Management Strategies: Routine screening Skin Self-Management Outcome: 4 (good)  Musculoskeletal Musculoskelatal Symptoms Reviewed: No symptoms reported Musculoskeletal Self-Management Outcome: 4 (good)  Psychosocial Psychosocial Symptoms Reported: No symptoms reported Quality of Family Relationships: supportive, helpful Do you feel physically threatened by others?: No     There were no vitals filed for this visit.  Medications Reviewed Today     Reviewed by Karoline Lima, RN (Registered Nurse) on 03/26/24 at 1125  Med List Status: <None>   Medication Order Taking? Sig Documenting Provider Last Dose Status Informant  acetaminophen  (TYLENOL ) 650 MG CR tablet [id number]  Take 650 mg by mouth every 8 (eight) hours as needed for pain. [provider]  Active Self  albuterol  (VENTOLIN  HFA) 108 (90 Base) MCG/ACT inhaler [id number]  Inhale 2 puffs into the lungs every 6 (six) hours as needed for wheezing or shortness of breath. Sowles, Krichna, MD  Active   amLODipine  (NORVASC ) 2.5 MG tablet [id number]  Take 1-2 tablets (2.5-5 mg total) by mouth in the morning and at bedtime. 2 tablets in am and one in pm Sowles, Krichna, MD  Active   apixaban  (ELIQUIS ) 5 MG TABS tablet [id number]  TAKE 1 TABLET(5 MG) BY MOUTH TWICE DAILY Darron Deatrice LABOR, MD  Active   budesonide -formoterol  (SYMBICORT ) 160-4.5 MCG/ACT inhaler [id number]  Inhale 2 puffs into the lungs 2 (two) times daily. Sowles, Krichna, MD  Active   Cholecalciferol (VITAMIN D ) 50 MCG (2000 UT) CAPS [id number]  Take 1 capsule by mouth daily at 12 noon. [provider]  Active   diltiazem  (CARDIZEM ) 30 MG tablet  [id number]  Take 1 tablet (30 mg total) by mouth every 6 (six) hours as needed (for tachycardia/recurrent Afib.). Vivienne Lonni Ingle, NP  Active Self           Med Note NORVEL KIRSCH   Thu Nov 19, 2021  9:25 AM)    loratadine  (CLARITIN ) 10 MG tablet [id number]  Take 1 tablet (10 mg total) by mouth at bedtime. Tapia, Leisa, PA-C  Active   promethazine  (PHENERGAN ) 12.5 MG tablet [id number]  Take 0.5 tablets (6.25 mg total) by mouth every 6 (six) hours as needed for up to 5 days for nausea or vomiting. Sowles, Krichna, MD  Expired 03/06/24 2359   rosuvastatin  (CRESTOR ) 10 MG tablet [id number]  Take 1 tablet (10 mg total) by mouth daily. Sowles, Krichna, MD  Active             Recommendation:   Continue Current Plan of Care  Follow Up Plan:   Telephone follow up appointment with Nurse Case Manager on April 23, 2024   Jackson Acron [facility] RN Care Manager Direct Dial: [phone]  Fax: [phone] Website: [email or web address]

## 2024-03-26 NOTE — Patient Instructions (Signed)
 Thank you for allowing the Complex Care Management team to participate in your care. It was great speaking with you!  Reminder: -Please be sure to check our voice messages for any missed calls from the Gastroenterology team. -Please be sure to seek immediate medical attention if you experience any of the worsening signs/symptoms we discussed.  We will follow up on April 13, 2024 at 1 pm. Please do not hesitate to contact me if you require assistance prior to our next outreach.    Jackson Acron [facility] Population Health RN Care Manager Direct Dial: [phone]  Fax: [phone] Website: [email or web address]

## 2024-04-05 ENCOUNTER — Ambulatory Visit (INDEPENDENT_AMBULATORY_CARE_PROVIDER_SITE_OTHER)

## 2024-04-05 DIAGNOSIS — Z23 Encounter for immunization: Secondary | ICD-10-CM | POA: Diagnosis not present

## 2024-04-06 ENCOUNTER — Encounter

## 2024-04-13 ENCOUNTER — Telehealth: Payer: Self-pay

## 2024-04-15 NOTE — Progress Notes (Signed)
 04/16/2024 [name] [id number] [birth year]  Gastroenterology Office Note    Referring Provider: Glenard Mire, MD Primary Care Physician:  Sowles, Krichna, MD  Primary GI Provider: Jinny Carmine, MD    Chief Complaint   Chief Complaint  Patient presents with   New Patient (Initial Visit)    Rectal bleeding 2-3 weeks ago-mixed with stool- old blood -brownish in color-normal BM's     History of Present Illness   [name] is a 83 y.o. female with PMHX of A-fib on Eliquis  presenting today at the request of Sowles, Krichna, MD due to rectal bleeding.   Patient was seen by PCP on 02/24/2024 and again on 03/06/2024 with concerns of darker red blood in her stool.  FIT positive for occult blood.  Patient reports reports this occurred 2-3 times.  States the blood appeared more wine colored.  She has a bowel movement daily, soft formed stool.  Denies having to strain or having abdominal pain.  She denies rectal pain, burning, or itching.  Denies abdominal pain. Denies NSAID or alcohol use.   04/01/2020 Colonoscopy - One 3 mm polyp in the transverse colon, removed with a cold biopsy forceps. Resected and retrieved. TUBULAR ADENOMA.  NEGATIVE FOR HIGH-GRADE DYSPLASIA AND MALIGNANCY.  - Diverticulosis in the sigmoid colon.  - Non-bleeding internal hemorrhoids.    Past Medical History:  Diagnosis Date   History of stress test    a. 02/2013 Nl stress test.   Hypertension    Insomnia, unspecified    Mitral regurgitation    a. echo 01/2016: nl LV sys fxn, mild MR, w/o pulm htn, nl atrial size   Osteoporosis, unspecified    Paroxysmal atrial fibrillation (HCC) 01/2016   a. diagnosed 01/2016; b. has been on eliquis , pradaxa , and xarelto  at varying times 2/2 cost of each medication-->currently on eliquis  (11/2016)   Pure hypercholesterolemia    Sleep apnea    Unable to tolerate nocturnal PAP therapy   TIA (transient ischemic attack)    Unspecified asthma(493.90)     Vertigo     Past Surgical History:  Procedure Laterality Date   ABDOMINAL HYSTERECTOMY     CATARACT EXTRACTION Bilateral 08/31/2016   COLONOSCOPY WITH PROPOFOL  N/A 03/04/2015   Procedure: COLONOSCOPY WITH PROPOFOL ;  Surgeon: Carmine Jinny, MD;  Location: [facility];  Service: Endoscopy;  Laterality: N/A;   COLONOSCOPY WITH PROPOFOL  N/A 04/01/2020   Procedure: COLONOSCOPY WITH PROPOFOL ;  Surgeon: Jinny Carmine, MD;  Location: [facility];  Service: Endoscopy;  Laterality: N/A;   DIAGNOSTIC LAPAROSCOPY     REMOVAL OF SCAR TISSUE (ADHESIONS)   KNEE ARTHROPLASTY Right 08/14/2021   Procedure: COMPUTER ASSISTED TOTAL KNEE ARTHROPLASTY - RNFA;  Surgeon: Mardee Lynwood SHAUNNA, MD;  Location: [facility];  Service: Orthopedics;  Laterality: Right;   KNEE ARTHROSCOPY Right 10/08/2020   Procedure: ARTHROSCOPY KNEE;  Surgeon: Mardee Lynwood SHAUNNA, MD;  Location: [facility];  Service: Orthopedics;  Laterality: Right;   KNEE ARTHROSCOPY Right 10/21/2020   Procedure: Arthroscopic irrigation and debridement right knee;  Surgeon: Mardee Lynwood SHAUNNA, MD;  Location: [facility];  Service: Orthopedics;  Laterality: Right;    Current Outpatient Medications  Medication Sig Dispense Refill   acetaminophen  (TYLENOL ) 650 MG CR tablet Take 650 mg by mouth every 8 (eight) hours as needed for pain.     albuterol  (VENTOLIN  HFA) 108 (90 Base) MCG/ACT inhaler Inhale 2 puffs into the lungs every 6 (six) hours as needed for wheezing or shortness of breath. 6.7 g 0  amLODipine  (NORVASC ) 2.5 MG tablet Take 1-2 tablets (2.5-5 mg total) by mouth in the morning and at bedtime. 2 tablets in am and one in pm 270 tablet 1   apixaban  (ELIQUIS ) 5 MG TABS tablet TAKE 1 TABLET(5 MG) BY MOUTH TWICE DAILY 60 tablet 5   budesonide -formoterol  (SYMBICORT ) 160-4.5 MCG/ACT inhaler Inhale 2 puffs into the lungs 2 (two) times daily. 10.2 g 0   Cholecalciferol (VITAMIN D ) 50 MCG (2000 UT) CAPS Take 1 capsule by mouth daily at 12 noon.     diltiazem  (CARDIZEM )  30 MG tablet Take 1 tablet (30 mg total) by mouth every 6 (six) hours as needed (for tachycardia/recurrent Afib.). 30 tablet 3   promethazine  (PHENERGAN ) 12.5 MG tablet Take 0.5 tablets (6.25 mg total) by mouth every 6 (six) hours as needed for up to 5 days for nausea or vomiting. 10 tablet 0   rosuvastatin  (CRESTOR ) 10 MG tablet Take 1 tablet (10 mg total) by mouth daily. 90 tablet 3   No current facility-administered medications for this visit.    Allergies as of 04/16/2024 - Review Complete 04/16/2024  Allergen Reaction Noted   Ace inhibitors Swelling 02/15/2013   Penicillins Anxiety 02/15/2013    Family History  Problem Relation Age of Onset   COPD Father    Hypertension Mother    Aneurysm Mother    Hypertension Brother    Stroke Brother    Cancer Brother        pancreatic   Healthy Brother     Social History   Socioeconomic History   Marital status: Single    Spouse name: Not on file   Number of children: 2   Years of education: Not on file   Highest education level: Associate degree: academic program  Occupational History   Occupation: Retired  Tobacco Use   Smoking status: Never   Smokeless tobacco: Never   Tobacco comments:    smoking cessation materials not required  Vaping Use   Vaping status: Never Used  Substance and Sexual Activity   Alcohol use: No    Alcohol/week: 0.0 standard drinks of alcohol   Drug use: No   Sexual activity: Not Currently    Partners: Male  Other Topics Concern   Not on file  Social History Narrative   Independent at baseline. Lives alone.    Social Drivers of Corporate investment banker Strain: Low Risk  (10/31/2023)   Overall Financial Resource Strain (CARDIA)    Difficulty of Paying Living Expenses: Not very hard  Food Insecurity: No Food Insecurity (10/31/2023)   Hunger Vital Sign    Worried About Running Out of Food in the Last Year: Never true    Ran Out of Food in the Last Year: Never true  Transportation Needs: No  Transportation Needs (10/31/2023)   PRAPARE - Administrator, Civil Service (Medical): No    Lack of Transportation (Non-Medical): No  Physical Activity: Insufficiently Active (05/06/2023)   Exercise Vital Sign    Days of Exercise per Week: 3 days    Minutes of Exercise per Session: 30 min  Stress: No Stress Concern Present (10/31/2023)   [employer] of Occupational Health - Occupational Stress Questionnaire    Feeling of Stress : Not at all  Social Connections: Moderately Integrated (10/31/2023)   Social Connection and Isolation Panel    Frequency of Communication with Friends and Family: More than three times a week    Frequency of Social Gatherings with Friends and  Family: More than three times a week    Attends Religious Services: More than 4 times per year    Active Member of Clubs or Organizations: Yes    Attends Club or Organization Meetings: Never    Marital Status: Divorced  Intimate Partner Violence: Not At Risk (10/31/2023)   Humiliation, Afraid, Rape, and Kick questionnaire    Fear of Current or Ex-Partner: No    Emotionally Abused: No    Physically Abused: No    Sexually Abused: No     RELEVANT GI HISTORY, IMAGING AND LABS: CBC    Component Value Date/Time   WBC 6.1 02/24/2024 0928   RBC 4.78 02/24/2024 0928   HGB 14.8 02/24/2024 0928   HGB 14.7 07/31/2019 0927   HCT 44.0 02/24/2024 0928   HCT 42.9 07/31/2019 0927   PLT 320 02/24/2024 0928   PLT 376 07/31/2019 0927   MCV 92.1 02/24/2024 0928   MCV 89 07/31/2019 0927   MCV 89 12/25/2012 0950   MCH 31.0 02/24/2024 0928   MCHC 33.6 02/24/2024 0928   RDW 12.9 02/24/2024 0928   RDW 12.8 07/31/2019 0927   RDW 13.3 12/25/2012 0950   LYMPHSABS 1.3 04/09/2022 1055   MONOABS 0.5 04/09/2022 1055   EOSABS 92 02/24/2024 0928   BASOSABS 31 02/24/2024 0928   Recent Labs    10/05/23 1529 02/24/24 0928  HGB 15.3 14.8    CMP     Component Value Date/Time   NA 138 02/24/2024 0928   NA 141 07/31/2019  0927   NA 137 12/25/2012 0950   K 5.0 02/24/2024 0928   K 3.7 12/25/2012 0950   CL 104 02/24/2024 0928   CL 104 12/25/2012 0950   CO2 28 02/24/2024 0928   CO2 27 12/25/2012 0950   GLUCOSE 109 (H) 02/24/2024 0928   GLUCOSE 98 12/25/2012 0950   BUN 19 02/24/2024 0928   BUN 12 07/31/2019 0927   BUN 30 (H) 12/25/2012 0950   CREATININE 0.95 02/24/2024 0928   CALCIUM  10.1 02/24/2024 0928   CALCIUM  9.8 12/25/2012 0950   PROT 7.9 02/24/2024 0928   PROT 7.4 01/09/2019 1023   ALBUMIN 4.1 04/09/2022 1055   ALBUMIN 4.3 01/09/2019 1023   AST 19 02/24/2024 0928   ALT 20 02/24/2024 0928   ALKPHOS 61 04/09/2022 1055   BILITOT 0.5 02/24/2024 0928   BILITOT 0.3 01/09/2019 1023   GFRNONAA >60 04/09/2022 1055   GFRNONAA 54 (L) 02/22/2020 0823   GFRAA 63 02/22/2020 0823      Latest Ref Rng & Units 02/24/2024    9:28 AM 10/05/2023    3:29 PM 05/30/2023   10:10 AM  Hepatic Function  Total Protein 6.1 - 8.1 g/dL 7.9  7.5  7.7   AST 10 - 35 U/L 19  20  18    ALT 6 - 29 U/L 20  20  20    Total Bilirubin 0.2 - 1.2 mg/dL 0.5  0.4  0.4       Review of Systems   All systems reviewed and negative except where noted in HPI.    Physical Exam  BP 103/65   Pulse (!) 57   Temp 97.7 F (36.5 C)   Ht 5' 3 (1.6 m)   Wt 173 lb 3.2 oz (78.6 kg)   SpO2 97%   BMI 30.68 kg/m  No LMP recorded. Patient has had a hysterectomy. General:   Alert and oriented.  Very pleasant and cooperative. Well-nourished and well-developed.  In no acute  distress Head:  Normocephalic and atraumatic. Eyes:  Without icterus Ears:  Normal auditory acuity. Neck:  Supple; no masses or thyromegaly. Lungs:  Respirations even and unlabored.  Clear throughout to auscultation.   No wheezes, crackles, or rhonchi. No acute distress. Heart:  Regular rate and rhythm; no murmurs, clicks, rubs, or gallops. Abdomen:  Normal bowel sounds.  No bruits.  Soft, non-tender and non-distended without masses, hepatosplenomegaly or hernias noted.   No guarding or rebound tenderness.   Rectal:  Deferred. Msk:  Symmetrical without gross deformities. Normal posture. Extremities:  Without edema. Neurologic:  Alert and  oriented x4;  grossly normal neurologically. Skin:  Intact without significant lesions or rashes. Psych:  Alert and cooperative. Normal mood and affect.   Assessment & Plan   [name] is a 83 y.o. female presenting today with several episodes of rectal bleeding.   Rectal bleeding. 2-3 episodes of dark red rectal bleeding on the stool. Positive FIT. - increase fiber and water intake - take miralax or stool softener as needed: currently without straining. - Proceed with colonoscopy to assess for internal hemorrhoids or occult malignancy. The risks, benefits, and alternatives have been discussed with the patient in detail, which include, but are not limited to: bleeding, infection, perforation & drug reaction. The patient states understanding and desires to proceed. - obtain cardiac clearance to hold Eliquis . - Further follow-up pending colonoscopy results    The patient was advised to call back or seek an in-person evaluation if the symptoms worsen or if the condition fails to improve as anticipated.  Grayce Bohr, DNP, AGNP-C [facility]

## 2024-04-16 ENCOUNTER — Telehealth: Payer: Self-pay | Admitting: Cardiovascular Disease

## 2024-04-16 ENCOUNTER — Ambulatory Visit: Admitting: Family Medicine

## 2024-04-16 ENCOUNTER — Encounter: Payer: Self-pay | Admitting: Family Medicine

## 2024-04-16 VITALS — BP 103/65 | HR 57 | Temp 97.7°F | Ht 63.0 in | Wt 173.2 lb

## 2024-04-16 DIAGNOSIS — K625 Hemorrhage of anus and rectum: Secondary | ICD-10-CM | POA: Diagnosis not present

## 2024-04-16 NOTE — Patient Instructions (Signed)
 I will call you when we can schedule the Colonoscopy. I will send Clearance to Dr.Arida. if you have not seen him lately please call his office to see if you need to schedule an appointment with him.

## 2024-04-16 NOTE — Telephone Encounter (Signed)
   Pre-operative Risk Assessment    Patient Name: [name]  DOB: Dec 11, 1940 MRN: [id number]   Date of last office visit: 08/25/2023 Date of next office visit: n/a   Request for Surgical Clearance    Procedure:  TBD  Date of Surgery:  Clearance TBD                                Surgeon:  not indicated Surgeon's Group or Practice Name:  [facility] Phone number:  [phone] Fax number:  [phone]   Type of Clearance Requested:   - Pharmacy:  Hold Apixaban  (Eliquis )     Type of Anesthesia:  Not Indicated   Additional requests/questions:    SignedTinnie NOVAK Schools   04/16/2024, 12:26 PM

## 2024-04-17 NOTE — Telephone Encounter (Signed)
Pt is having a colonoscopy.

## 2024-04-18 ENCOUNTER — Other Ambulatory Visit: Payer: Self-pay

## 2024-04-18 ENCOUNTER — Ambulatory Visit
Admission: RE | Admit: 2024-04-18 | Discharge: 2024-04-18 | Disposition: A | Discharge status: Home / Self Care | Source: Ambulatory Visit | Attending: Family Medicine | Admitting: Family Medicine

## 2024-04-18 ENCOUNTER — Telehealth: Payer: Self-pay

## 2024-04-18 DIAGNOSIS — K625 Hemorrhage of anus and rectum: Secondary | ICD-10-CM

## 2024-04-18 DIAGNOSIS — Z1231 Encounter for screening mammogram for malignant neoplasm of breast: Secondary | ICD-10-CM | POA: Insufficient documentation

## 2024-04-18 MED ORDER — NA SULFATE-K SULFATE-MG SULF 17.5-3.13-1.6 GM/177ML PO SOLN: 1.0000 | Freq: Once | ORAL | 0 refills | Status: AC

## 2024-04-18 NOTE — Telephone Encounter (Signed)
 Error

## 2024-04-18 NOTE — Telephone Encounter (Signed)
 Scheduled colonoscopy 06/18/24 .

## 2024-04-18 NOTE — Telephone Encounter (Signed)
  error

## 2024-04-20 NOTE — Telephone Encounter (Signed)
   Patient Name: [name]  DOB: 02/18/1941 MRN: [id number]  Primary Cardiologist: None  Clinical pharmacists have reviewed the patient's past medical history, labs, and current medications as part of preoperative protocol coverage. The following recommendations have been made:  Patient with diagnosis of atrial fibrillation on Eliquis  for anticoagulation.     Procedure: Colonoscopy  Date of procedure: TBD     CHA2DS2-VASc Score = 7   This indicates a 11.2% annual risk of stroke. The patient's score is based upon: CHF History: 0 HTN History: 1 Diabetes History: 0 Stroke History: 2 Vascular Disease History: 1 Age Score: 2 Gender Score: 1     CrCl 46 ml/min Platelet count 320 K   Patient has not had an Afib/aflutter ablation within the last 3 months or DCCV within the last 30 days   Per office protocol, patient can hold Eliquis  for 2 days prior to procedure.     Patient will not need bridging with Lovenox (enoxaparin) around procedure.   I will route this recommendation to the requesting party via Epic fax function and remove from pre-op pool.  Please call with questions.  Lamarr Satterfield, NP 04/20/2024, 3:36 PM

## 2024-04-20 NOTE — Telephone Encounter (Signed)
 Patient with diagnosis of atrial fibrillation on Eliquis  for anticoagulation.    Procedure: Colonoscopy  Date of procedure: TBD   CHA2DS2-VASc Score = 7   This indicates a 11.2% annual risk of stroke. The patient's score is based upon: CHF History: 0 HTN History: 1 Diabetes History: 0 Stroke History: 2 Vascular Disease History: 1 Age Score: 2 Gender Score: 1    CrCl 46 ml/min Platelet count 320 K  Patient has not had an Afib/aflutter ablation within the last 3 months or DCCV within the last 30 days  Per office protocol, patient can hold Eliquis  for 2 days prior to procedure.    Patient will not need bridging with Lovenox (enoxaparin) around procedure.  **This guidance is not considered finalized until pre-operative APP has relayed final recommendations.**

## 2024-04-24 ENCOUNTER — Telehealth: Payer: Self-pay

## 2024-04-24 NOTE — Telephone Encounter (Signed)
 LVM stating she can HOLD Eliquis  2 days prior to procedure- will not need bridging with lovenox around procedure.

## 2024-04-24 NOTE — Telephone Encounter (Signed)
 I spoke to pt and went over the instructions and she is aware to hold the eliquis  x 2 days prior to procedure

## 2024-04-25 ENCOUNTER — Other Ambulatory Visit: Payer: Self-pay

## 2024-04-25 NOTE — Patient Instructions (Addendum)
 Thank you for allowing the Complex Care Management team to participate in your care. It was great speaking with you today!  We will follow up on May 15, 2024 at 2 pm. Please do not hesitate to contact me if you require assistance prior to our next outreach.   Jackson Acron [facility] Population Health RN Care Manager Direct Dial: [phone]  Fax: [phone] Website: [email or web address]

## 2024-04-25 NOTE — Patient Outreach (Signed)
 Complex Care Management   Visit Note  04/25/2024  Name:  [name] MRN: [id number] DOB: May 10, 1941  Situation: Referral received for Complex Care Management related to Atrial Fibrillation I obtained verbal consent from Patient.  Visit completed with Ms. Trapani via telephone.  Background:   Past Medical History:  Diagnosis Date   History of stress test    a. 02/2013 Nl stress test.   Hypertension    Insomnia, unspecified    Mitral regurgitation    a. echo 01/2016: nl LV sys fxn, mild MR, w/o pulm htn, nl atrial size   Osteoporosis, unspecified    Paroxysmal atrial fibrillation (HCC) 01/2016   a. diagnosed 01/2016; b. has been on eliquis , pradaxa , and xarelto  at varying times 2/2 cost of each medication-->currently on eliquis  (11/2016)   Pure hypercholesterolemia    Sleep apnea    Unable to tolerate nocturnal PAP therapy   TIA (transient ischemic attack)    Unspecified asthma(493.90)    Vertigo      Assessment: Patient Reported Symptoms: Cognitive Cognitive Status: Alert and oriented to person, place, and time, Normal speech and language skills Cognitive/Intellectual Conditions Management [RPT]: None reported or documented in medical history or problem list Health Maintenance Behaviors: Annual physical exam, Social activities Healing Pattern: Average Health Facilitated by: Rest, Healthy diet  Neurological Neurological Review of Symptoms: No symptoms reported Neurological Management Strategies: Medical device, Medication therapy, Routine screening Neurological Self-Management Outcome: 4 (good)  HEENT HEENT Symptoms Reported: No symptoms reported HEENT Management Strategies: Medication therapy, Routine screening HEENT Self-Management Outcome: 4 (good)  Cardiovascular Cardiovascular Symptoms Reported: No symptoms reported Does patient have uncontrolled Hypertension?: No Cardiovascular Management Strategies: Diet modification, Medication therapy, Routine screening, Medical  device Weight: 171 lb (77.6 kg) Cardiovascular Self-Management Outcome: 5 (very good)  Respiratory Respiratory Symptoms Reported: No symptoms reported Respiratory Management Strategies: Asthma action plan Respiratory Self-Management Outcome: 5 (very good)  Endocrine Endocrine Symptoms Reported: No symptoms reported Is patient diabetic?: No  Gastrointestinal Gastrointestinal Symptoms Reported: No symptoms reported Other Gastrointestinal Symptoms: Denies symptoms. Pending colonoscopy on June 18, 2024 Gastrointestinal Self-Management Outcome: 4 (good)  Genitourinary Genitourinary Symptoms Reported: No symptoms reported Genitourinary Self-Management Outcome: 4 (good)  Integumentary Integumentary Symptoms Reported: No symptoms reported Skin Management Strategies: Routine screening Skin Self-Management Outcome: 4 (good)  Musculoskeletal Musculoskelatal Symptoms Reviewed: No symptoms reported Musculoskeletal Management Strategies: Routine screening, Coping strategies, Medication therapy Musculoskeletal Self-Management Outcome: 4 (good)  Psychosocial Psychosocial Symptoms Reported: No symptoms reported Quality of Family Relationships: supportive, helpful Do you feel physically threatened by others?: No    04/25/2024    PHQ2-9 Depression Screening   Little interest or pleasure in doing things Not at all  Feeling down, depressed, or hopeless Not at all  PHQ-2 - Total Score 0    There were no vitals filed for this visit.  Medications Reviewed Today     Reviewed by Karoline Lima, RN (Registered Nurse) on 04/25/24 at 1313  Med List Status: <None>   Medication Order Taking? Sig Documenting Provider Last Dose Status Informant  acetaminophen  (TYLENOL ) 650 MG CR tablet [id number]  Take 650 mg by mouth every 8 (eight) hours as needed for pain. [provider]  Active Self  albuterol  (VENTOLIN  HFA) 108 (90 Base) MCG/ACT inhaler [id number]  Inhale 2 puffs into the lungs every 6 (six)  hours as needed for wheezing or shortness of breath. Sowles, Krichna, MD  Active   amLODipine  (NORVASC ) 2.5 MG tablet [id number]  Take 1-2 tablets (2.5-5 mg total) by mouth  in the morning and at bedtime. 2 tablets in am and one in pm Sowles, Krichna, MD  Active   apixaban  (ELIQUIS ) 5 MG TABS tablet [id number]  TAKE 1 TABLET(5 MG) BY MOUTH TWICE DAILY Darron Deatrice LABOR, MD  Active   budesonide -formoterol  (SYMBICORT ) 160-4.5 MCG/ACT inhaler [id number]  Inhale 2 puffs into the lungs 2 (two) times daily. Sowles, Krichna, MD  Active   Cholecalciferol (VITAMIN D ) 50 MCG (2000 UT) CAPS [id number]  Take 1 capsule by mouth daily at 12 noon. [provider]  Active   diltiazem  (CARDIZEM ) 30 MG tablet [id number]  Take 1 tablet (30 mg total) by mouth every 6 (six) hours as needed (for tachycardia/recurrent Afib.). Vivienne Lonni Ingle, NP  Active Self           Med Note NORVEL KIRSCH   Thu Nov 19, 2021  9:25 AM)    promethazine  (PHENERGAN ) 12.5 MG tablet [id number]  Take 0.5 tablets (6.25 mg total) by mouth every 6 (six) hours as needed for up to 5 days for nausea or vomiting. Sowles, Krichna, MD  Expired 04/16/24 2359   rosuvastatin  (CRESTOR ) 10 MG tablet [id number]  Take 1 tablet (10 mg total) by mouth daily. Sowles, Krichna, MD  Active             Recommendation:   Continue Current Plan of Care  Follow Up Plan:   Telephone follow up appointment on May 15, 2024    Jackson Acron [facility] RN Care Manager Direct Dial: [phone]  Fax: [phone] Website: [email or web address]

## 2024-04-30 ENCOUNTER — Telehealth: Payer: Self-pay

## 2024-04-30 NOTE — Telephone Encounter (Signed)
 Patient wanted to let us  know that she passed a small streak of blood in her stool- she is scheduled for colonoscopy 06/18/24-I let her know if she passes a large amount of blood to go to ED and to continue with Colonoscopy.

## 2024-05-04 ENCOUNTER — Encounter: Payer: Self-pay | Admitting: Family Medicine

## 2024-05-04 ENCOUNTER — Ambulatory Visit: Admitting: Family Medicine

## 2024-05-04 VITALS — BP 130/78 | HR 62 | Resp 18 | Ht 63.0 in | Wt 174.2 lb

## 2024-05-04 DIAGNOSIS — Z139 Encounter for screening, unspecified: Secondary | ICD-10-CM

## 2024-05-04 DIAGNOSIS — K625 Hemorrhage of anus and rectum: Secondary | ICD-10-CM | POA: Diagnosis not present

## 2024-05-04 DIAGNOSIS — G4733 Obstructive sleep apnea (adult) (pediatric): Secondary | ICD-10-CM

## 2024-05-04 DIAGNOSIS — J4520 Mild intermittent asthma, uncomplicated: Secondary | ICD-10-CM | POA: Diagnosis not present

## 2024-05-04 DIAGNOSIS — Z8673 Personal history of transient ischemic attack (TIA), and cerebral infarction without residual deficits: Secondary | ICD-10-CM | POA: Diagnosis not present

## 2024-05-04 DIAGNOSIS — M48062 Spinal stenosis, lumbar region with neurogenic claudication: Secondary | ICD-10-CM | POA: Diagnosis not present

## 2024-05-04 DIAGNOSIS — I700 Atherosclerosis of aorta: Secondary | ICD-10-CM

## 2024-05-04 DIAGNOSIS — Z111 Encounter for screening for respiratory tuberculosis: Secondary | ICD-10-CM

## 2024-05-04 DIAGNOSIS — I10 Essential (primary) hypertension: Secondary | ICD-10-CM

## 2024-05-04 DIAGNOSIS — I729 Aneurysm of unspecified site: Secondary | ICD-10-CM | POA: Diagnosis not present

## 2024-05-04 DIAGNOSIS — I480 Paroxysmal atrial fibrillation: Secondary | ICD-10-CM | POA: Diagnosis not present

## 2024-05-04 DIAGNOSIS — G3184 Mild cognitive impairment, so stated: Secondary | ICD-10-CM

## 2024-05-04 MED ORDER — BUDESONIDE-FORMOTEROL FUMARATE 160-4.5 MCG/ACT IN AERO: 2.0000 | INHALATION_SPRAY | Freq: Two times a day (BID) | RESPIRATORY_TRACT | 0 refills | Status: AC

## 2024-05-04 MED ORDER — AMLODIPINE BESYLATE 2.5 MG PO TABS: 2.5000 mg | ORAL_TABLET | Freq: Two times a day (BID) | ORAL | 1 refills | Status: AC

## 2024-05-04 NOTE — Progress Notes (Signed)
 Name: CABELA PACIFICO   MRN: [id number]    DOB: 1940-11-07   Date:05/04/2024       Progress Note  Subjective  Chief Complaint  Chief Complaint  Patient presents with   Form Completion   Discussed the use of AI scribe software for clinical note transcription with the patient, who gave verbal consent to proceed.  History of Present Illness [name] is an 83 year old female who presents for a follow-up visit for rectal bleeding.  She has a history of rectal bleeding, previously evaluated with a positive fecal immunochemical test for occult blood. She is scheduled for a colonoscopy following a previous one in 2021 that revealed one polyp but no hemorrhoids. There are no current changes in bowel movements or visible blood in stools, although she noticed coagulated blood once after the last visit. She occasionally experiences mild left upper quadrant pain, described as a 'little pain over here'.  She has paroxysmal atrial fibrillation and is currently taking Eliquis  5 mg twice daily. She uses diltiazem  as needed for rhythm control, taken very seldom, and reports resting and feeling better after taking it.  She has a history of a mini-stroke (TIA) and an internal carotid artery (ICA) aneurysm, monitored with periodic MRAs. The aneurysm has been stable, with no recent headaches or occipital neuralgia pain. She occasionally uses Tylenol  for pain relief.  She has lumbar spine stenosis and experiences leg pain when walking certain distances. She had a right knee replacement and receives gel injections in her left knee, which she finds helpful.  She has mild intermittent asthma and uses Symbicort  as needed, ensuring not to use expired inhalers. She reports occasional wheezing but no recent episodes.  She has obstructive sleep apnea but does not currently use a CPAP machine due to intolerance of the mask. She has not had a sleep study in over three years.  She takes rosuvastatin  for  atherosclerosis and high cholesterol, and amlodipine  for high blood pressure, which is well controlled. She reports generalized soreness but no specific muscle aches related to medication.  She has a history of vertigo and uses promethazine  as needed for nausea, though she has not experienced recent episodes.  She lives alone, and her daughter resides in [place] . She is planning to return to substitute teaching.    Patient Active Problem List   Diagnosis Date Noted   Rectal bleed 02/24/2024   Senile purpura 05/24/2022   Benign paroxysmal positional vertigo 05/24/2022   Thrombocytosis 11/19/2021   Mixed hyperlipidemia 11/19/2021   Atherosclerosis of aorta 11/19/2021   OSA (obstructive sleep apnea) 11/19/2021   Pre-diabetes 11/19/2021   Lumbar stenosis with neurogenic claudication 11/19/2021   Vitamin D  deficiency 11/19/2021   History of total right knee replacement 08/14/2021   Osteoarthritis of right knee 05/05/2020   History of colonic polyps    Polyp of transverse colon    Aneurysm 02/08/2018   History of CVA (cerebrovascular accident) without residual deficits 01/24/2018   Aneurysm of ophthalmic artery 01/24/2018   Impingement syndrome of shoulder region 01/02/2018   History of cataract surgery, right 09/01/2016   Paroxysmal atrial fibrillation (HCC) 02/24/2016   Vertigo 02/24/2016   Low HDL (under 40) 02/17/2016   Hx of colonic polyps    Benign neoplasm of sigmoid colon    Mild intermittent asthma without complication 02/21/2015   Hyperglycemia 02/21/2015   Essential hypertension 02/20/2015   Inconclusive mammogram due to dense breasts 01/02/2015   Dense breast 01/02/2015   Abnormal EKG  02/23/2013    Past Surgical History:  Procedure Laterality Date   ABDOMINAL HYSTERECTOMY     CATARACT EXTRACTION Bilateral 08/31/2016   COLONOSCOPY WITH PROPOFOL  N/A 03/04/2015   Procedure: COLONOSCOPY WITH PROPOFOL ;  Surgeon: Rogelia Copping, MD;  Location: [facility];  Service:  Endoscopy;  Laterality: N/A;   COLONOSCOPY WITH PROPOFOL  N/A 04/01/2020   Procedure: COLONOSCOPY WITH PROPOFOL ;  Surgeon: Copping Rogelia, MD;  Location: [facility];  Service: Endoscopy;  Laterality: N/A;   DIAGNOSTIC LAPAROSCOPY     REMOVAL OF SCAR TISSUE (ADHESIONS)   KNEE ARTHROPLASTY Right 08/14/2021   Procedure: COMPUTER ASSISTED TOTAL KNEE ARTHROPLASTY - RNFA;  Surgeon: Mardee Lynwood SQUIBB, MD;  Location: [facility];  Service: Orthopedics;  Laterality: Right;   KNEE ARTHROSCOPY Right 10/08/2020   Procedure: ARTHROSCOPY KNEE;  Surgeon: Mardee Lynwood SQUIBB, MD;  Location: [facility];  Service: Orthopedics;  Laterality: Right;   KNEE ARTHROSCOPY Right 10/21/2020   Procedure: Arthroscopic irrigation and debridement right knee;  Surgeon: Mardee Lynwood SQUIBB, MD;  Location: [facility];  Service: Orthopedics;  Laterality: Right;    Family History  Problem Relation Age of Onset   COPD Father    Hypertension Mother    Aneurysm Mother    Hypertension Brother    Stroke Brother    Cancer Brother        pancreatic   Healthy Brother     Social History   Tobacco Use   Smoking status: Never   Smokeless tobacco: Never   Tobacco comments:    smoking cessation materials not required  Substance Use Topics   Alcohol use: No    Alcohol/week: 0.0 standard drinks of alcohol     Current Outpatient Medications:    acetaminophen  (TYLENOL ) 650 MG CR tablet, Take 650 mg by mouth every 8 (eight) hours as needed for pain., Disp: , Rfl:    albuterol  (VENTOLIN  HFA) 108 (90 Base) MCG/ACT inhaler, Inhale 2 puffs into the lungs every 6 (six) hours as needed for wheezing or shortness of breath., Disp: 6.7 g, Rfl: 0   apixaban  (ELIQUIS ) 5 MG TABS tablet, TAKE 1 TABLET(5 MG) BY MOUTH TWICE DAILY, Disp: 60 tablet, Rfl: 5   Cholecalciferol (VITAMIN D ) 50 MCG (2000 UT) CAPS, Take 1 capsule by mouth daily at 12 noon., Disp: , Rfl:    diltiazem  (CARDIZEM ) 30 MG tablet, Take 1 tablet (30 mg total) by mouth every 6 (six) hours as  needed (for tachycardia/recurrent Afib.)., Disp: 30 tablet, Rfl: 3   rosuvastatin  (CRESTOR ) 10 MG tablet, Take 1 tablet (10 mg total) by mouth daily., Disp: 90 tablet, Rfl: 3   amLODipine  (NORVASC ) 2.5 MG tablet, Take 1-2 tablets (2.5-5 mg total) by mouth in the morning and at bedtime. 2 tablets in am and one in pm, Disp: 270 tablet, Rfl: 1   budesonide -formoterol  (SYMBICORT ) 160-4.5 MCG/ACT inhaler, Inhale 2 puffs into the lungs 2 (two) times daily., Disp: 10.2 g, Rfl: 0   promethazine  (PHENERGAN ) 12.5 MG tablet, Take 0.5 tablets (6.25 mg total) by mouth every 6 (six) hours as needed for up to 5 days for nausea or vomiting., Disp: 10 tablet, Rfl: 0  Allergies  Allergen Reactions   Ace Inhibitors Swelling    Patient states her tongue swell.   Penicillins Anxiety    IgE = 251 (WNL) on 07/31/2021  Has patient had a PCN reaction causing immediate rash, facial/tongue/throat swelling, SOB or lightheadedness with hypotension: No Has patient had a PCN reaction causing severe rash involving mucus membranes or skin  necrosis: No Has patient had a PCN reaction that required hospitalization No Has patient had a PCN reaction occurring within the last 10 years: No If all of the above answers are NO, then may proceed with Cephalosporin use.    I personally reviewed active problem list, medication list, allergies with the patient/caregiver today.   ROS  Ten systems reviewed and is negative except as mentioned in HPI    Objective Physical Exam CONSTITUTIONAL: Patient appears well-developed and well-nourished.  No distress. HEENT: Head atraumatic, normocephalic, neck supple. CARDIOVASCULAR: Normal rate, regular rhythm and normal heart sounds.  No murmur heard. No BLE edema. PULMONARY: Effort normal and breath sounds normal. No respiratory distress. ABDOMINAL: There is no tenderness or distention. MUSCULOSKELETAL: Normal gait. Without gross motor or sensory deficit. PSYCHIATRIC: Patient has a normal  mood and affect. behavior is normal. Judgment and thought content normal.  Vitals:   05/04/24 1356  BP: 130/78  Pulse: 62  Resp: 18  SpO2: 96%  Weight: 174 lb 3.2 oz (79 kg)  Height: 5' 3 (1.6 m)    Body mass index is 30.86 kg/m.  Recent Results (from the past 2160 hours)  CBC with Differential/Platelet     Status: None   Collection Time: 02/24/24  9:28 AM  Result Value Ref Range   WBC 6.1 3.8 - 10.8 Thousand/uL   RBC 4.78 3.80 - 5.10 Million/uL   Hemoglobin 14.8 11.7 - 15.5 g/dL   HCT 55.9 64.9 - 54.9 %   MCV 92.1 80.0 - 100.0 fL   MCH 31.0 27.0 - 33.0 pg   MCHC 33.6 32.0 - 36.0 g/dL    Comment: For adults, a slight decrease in the calculated MCHC value (in the range of 30 to 32 g/dL) is most likely not clinically significant; however, it should be interpreted with caution in correlation with other red cell parameters and the patient's clinical condition.    RDW 12.9 11.0 - 15.0 %   Platelets 320 140 - 400 Thousand/uL   MPV 11.6 7.5 - 12.5 fL   Neutro Abs 4,130 1,500 - 7,800 cells/uL   Absolute Lymphocytes 1,257 850 - 3,900 cells/uL   Absolute Monocytes 592 200 - 950 cells/uL   Eosinophils Absolute 92 15 - 500 cells/uL   Basophils Absolute 31 0 - 200 cells/uL   Neutrophils Relative % 67.7 %   Total Lymphocyte 20.6 %   Monocytes Relative 9.7 %   Eosinophils Relative 1.5 %   Basophils Relative 0.5 %  Comprehensive metabolic panel with GFR     Status: Abnormal   Collection Time: 02/24/24  9:28 AM  Result Value Ref Range   Glucose, Bld 109 (H) 65 - 99 mg/dL    Comment: .            Fasting reference interval . For someone without known diabetes, a glucose value between 100 and 125 mg/dL is consistent with prediabetes and should be confirmed with a follow-up test. .    BUN 19 7 - 25 mg/dL   Creat 9.04 9.39 - 9.04 mg/dL   eGFR 60 > OR = 60 fO/fpw/8.26f7   BUN/Creatinine Ratio SEE NOTE: 6 - 22 (calc)    Comment:    Not Reported: BUN and Creatinine are  within    reference range. .    Sodium 138 135 - 146 mmol/L   Potassium 5.0 3.5 - 5.3 mmol/L   Chloride 104 98 - 110 mmol/L   CO2 28 20 - 32 mmol/L   Calcium   10.1 8.6 - 10.4 mg/dL   Total Protein 7.9 6.1 - 8.1 g/dL   Albumin 4.4 3.6 - 5.1 g/dL   Globulin 3.5 1.9 - 3.7 g/dL (calc)   AG Ratio 1.3 1.0 - 2.5 (calc)   Total Bilirubin 0.5 0.2 - 1.2 mg/dL   Alkaline phosphatase (APISO) 65 37 - 153 U/L   AST 19 10 - 35 U/L   ALT 20 6 - 29 U/L  Fecal Globin By Immunochemistry     Status: Abnormal   Collection Time: 02/27/24 12:00 AM  Result Value Ref Range   MICRO NUMBER: [id number]    SPECIMEN QUALITY: Adequate    Source: INSURE (TM) FOBT TEST CARD    STATUS: FINAL    FECAL GLOBIN RESULT: Detected (A)     Comment: Reference Range:Not Detected   COMMENT:      NOTE: Approved collection includes sample of toilet water adjacent to stool. Other methods of collection such as stool transferred from diaper, bedpan, or commode to toilet water may lead to inaccurate results.    Diabetic Foot Exam:     PHQ2/9:    05/04/2024    1:51 PM 04/25/2024    1:35 PM 03/06/2024   10:23 AM 02/14/2024   12:36 PM 01/17/2024   12:35 PM  Depression screen PHQ 2/9  Decreased Interest 0 0 0 0 0  Down, Depressed, Hopeless 0 0 0 0 0  PHQ - 2 Score 0 0 0 0 0    phq 9 is negative  Fall Risk:    05/04/2024    1:51 PM 03/06/2024   10:23 AM 01/11/2024    8:09 AM 10/31/2023    3:35 PM 10/05/2023    2:40 PM  Fall Risk   Falls in the past year? 0 0 0 0 0  Number falls in past yr: 0 0 0 0 0  Injury with Fall? 0 0 0 0 0  Risk for fall due to : No Fall Risks No Fall Risks No Fall Risks Medication side effect;Orthopedic patient No Fall Risks  Follow up Falls evaluation completed Falls evaluation completed Falls evaluation completed Falls prevention discussed Falls prevention discussed;Education provided;Falls evaluation completed      Assessment & Plan Encounter for screening for respiratory tuberculosis/MMR and  hepatitis B for substitute teaching  No recent travel or TB exposure. No symptoms of cough or shortness of breath. - Ordered QuantiFERON Gold blood test for TB screening.  Hemorrhage of anus and rectum, pending colonoscopy Positive FIT test. Colonoscopy scheduled. Occasional coagulated blood, no significant bowel changes. Occasional left upper quadrant pain. - Proceed with scheduled colonoscopy. - Inform GI doctor about occasional left upper quadrant pain.  Paroxysmal atrial fibrillation, on anticoagulation and rate control Managed with Eliquis  and diltiazem . Rare diltiazem  use, effective. - Continue Eliquis  5 mg twice daily. - Use diltiazem  as needed for rate control. - Requested 90-day supply of Eliquis  from pharmacy.  Essential hypertension, well controlled Controlled with amlodipine . - Continue amlodipine  2.5 mg twice daily and 2.5 mg at night. - Sent prescription for amlodipine  to pharmacy.  Atherosclerosis of aorta and hyperlipidemia, on statin therapy Managed with rosuvastatin . Generalized body aches, likely unrelated to statin. - Continue rosuvastatin  therapy. - Ordered lipid panel for next visit.  Mild cognitive impairment Occasional forgetfulness. Lives independently. No significant daily impact. - Continue to monitor cognitive function and ensure safety in daily activities.  Obstructive sleep apnea, not using CPAP Intolerant to CPAP. Last sleep study over three years ago. Discussed new CPAP  options, cost concerns. - Consider re-evaluation for CPAP if symptoms worsen or new options become available.  Lumbar spinal stenosis with neurogenic claudication and bilateral knee osteoarthritis, post right knee replacement, left knee intra-articular injections Right knee replaced. Left knee managed with injections, providing temporary relief. - Continue with left knee intra-articular injections as needed.  Mild intermittent asthma Managed with Symbicort . - Continue Symbicort  as  needed. - Ensure Symbicort  is not expired before use.  Stable aneurysm of left  3 mm aneurysm periophthalmic , stable on MRA. Continue monitoring with periodic imaging as per vascular specialist recommendations.  General Health Maintenance Vaccinations up to date except for COVID. - Obtain updated COVID vaccine from pharmacy.

## 2024-05-10 ENCOUNTER — Telehealth: Payer: Self-pay

## 2024-05-10 LAB — QUANTIFERON-TB GOLD PLUS
Mitogen-NIL: 3.27 [IU]/mL
NIL: 0.01 [IU]/mL
QuantiFERON-TB Gold Plus: NEGATIVE
TB1-NIL: 0 [IU]/mL
TB2-NIL: 0 [IU]/mL

## 2024-05-10 LAB — MEASLES/MUMPS/RUBELLA IMMUNITY
Mumps IgG: 31.5 [AU]/ml
Rubella: <0.9 {index} — ABNORMAL LOW
Rubeola IgG: >300 [AU]/ml

## 2024-05-10 LAB — LIPID PANEL
Cholesterol: 104 mg/dL
HDL: 44 mg/dL — ABNORMAL LOW
LDL Cholesterol (Calc): 42 mg/dL
Non-HDL Cholesterol (Calc): 60 mg/dL
Total CHOL/HDL Ratio: 2.4 (calc)
Triglycerides: 101 mg/dL

## 2024-05-10 LAB — HEPATITIS B SURFACE ANTIBODY,QUALITATIVE: Hep B S Ab: NONREACTIVE

## 2024-05-10 NOTE — Telephone Encounter (Signed)
 Pt is expecting her 1new grandchild the week of Dec 19th lives in [place] and has requested to reschedule colonoscopy to sooner date.  Colonoscopy has been rescheduled to 05/22/24 at [facility] still with Dr. Jinny.  Instructions updated. Referral updated.

## 2024-05-13 ENCOUNTER — Ambulatory Visit: Payer: Self-pay | Admitting: Family Medicine

## 2024-05-14 ENCOUNTER — Telehealth: Payer: Self-pay | Admitting: Cardiovascular Disease

## 2024-05-14 NOTE — Telephone Encounter (Signed)
   Pre-operative Risk Assessment    Patient Name: [name]  DOB: January 25, 1941 MRN: [id number]   Date of last office visit: 08/25/23 Date of next office visit: PRN   Request for Surgical Clearance    Procedure:  Colonoscopy  Date of Surgery:  Clearance 05/22/24                                Surgeon:  Dr. Rogelia Copping Surgeon's Group or Practice Name:  [facility] Phone number:  [phone] Fax number:  [phone]   Type of Clearance Requested:   - Pharmacy:  Hold Apixaban  (Eliquis ) Hold 05/20/24   Type of Anesthesia:  Not Indicated   Additional requests/questions:    Bonney Audrene LOISE Agapito   05/14/2024, 9:28 AM

## 2024-05-15 ENCOUNTER — Telehealth: Payer: Self-pay

## 2024-05-17 ENCOUNTER — Ambulatory Visit: Payer: Self-pay

## 2024-05-17 DIAGNOSIS — Z0000 Encounter for general adult medical examination without abnormal findings: Secondary | ICD-10-CM | POA: Diagnosis not present

## 2024-05-17 NOTE — Patient Instructions (Addendum)
 [name],  Thank you for taking the time for your Medicare Wellness Visit. I appreciate your continued commitment to your health goals. Please review the care plan we discussed, and feel free to reach out if I can assist you further.  Please note that Annual Wellness Visits do not include a physical exam. Some assessments may be limited, especially if the visit was conducted virtually. If needed, we may recommend an in-person follow-up with your provider.  Ongoing Care Seeing your primary care provider every 3 to 6 months helps us  monitor your health and provide consistent, personalized care.   Referrals If a referral was made during today's visit and you haven't received any updates within two weeks, please contact the referred provider directly to check on the status.  Recommended Screenings:  Health Maintenance  Topic Date Due   COVID-19 Vaccine (8 - 2025-26 season) 02/27/2024   Colon Cancer Screening  04/01/2025   Breast Cancer Screening  04/18/2025   Medicare Annual Wellness Visit  05/17/2025   Osteoporosis screening with Bone Density Scan  03/19/2026   DTaP/Tdap/Td vaccine (3 - Td or Tdap) 02/04/2031   Pneumococcal Vaccine for age over 90  Completed   Flu Shot  Completed   Zoster (Shingles) Vaccine  Completed   Meningitis B Vaccine  Aged Out   Hepatitis C Screening  Discontinued     Vision: Annual vision screenings are recommended for early detection of glaucoma, cataracts, and diabetic retinopathy. These exams can also reveal signs of chronic conditions such as diabetes and high blood pressure.  Dental: Annual dental screenings help detect early signs of oral cancer, gum disease, and other conditions linked to overall health, including heart disease and diabetes.  Please see the attached documents for additional preventive care recommendations.   NEXT AWV 05/30/25 @ 8:10 AM IN PERSON

## 2024-05-17 NOTE — Progress Notes (Signed)
 No chief complaint on file.    Subjective:   [name] is a 83 y.o. female who presents for a Medicare Annual Wellness Visit.  Allergies (verified) Ace inhibitors and Penicillins   History: Past Medical History:  Diagnosis Date   History of stress test    a. 02/2013 Nl stress test.   Hypertension    Insomnia, unspecified    Mitral regurgitation    a. echo 01/2016: nl LV sys fxn, mild MR, w/o pulm htn, nl atrial size   Osteoporosis, unspecified    Paroxysmal atrial fibrillation (HCC) 01/2016   a. diagnosed 01/2016; b. has been on eliquis , pradaxa , and xarelto  at varying times 2/2 cost of each medication-->currently on eliquis  (11/2016)   Pure hypercholesterolemia    Sleep apnea    Unable to tolerate nocturnal PAP therapy   TIA (transient ischemic attack)    Unspecified asthma(493.90)    Vertigo    Past Surgical History:  Procedure Laterality Date   ABDOMINAL HYSTERECTOMY     CATARACT EXTRACTION Bilateral 08/31/2016   COLONOSCOPY WITH PROPOFOL  N/A 03/04/2015   Procedure: COLONOSCOPY WITH PROPOFOL ;  Surgeon: Rogelia Copping, MD;  Location: [facility];  Service: Endoscopy;  Laterality: N/A;   COLONOSCOPY WITH PROPOFOL  N/A 04/01/2020   Procedure: COLONOSCOPY WITH PROPOFOL ;  Surgeon: Copping Rogelia, MD;  Location: [facility];  Service: Endoscopy;  Laterality: N/A;   DIAGNOSTIC LAPAROSCOPY     REMOVAL OF SCAR TISSUE (ADHESIONS)   KNEE ARTHROPLASTY Right 08/14/2021   Procedure: COMPUTER ASSISTED TOTAL KNEE ARTHROPLASTY - RNFA;  Surgeon: Mardee Lynwood SHAUNNA, MD;  Location: [facility];  Service: Orthopedics;  Laterality: Right;   KNEE ARTHROSCOPY Right 10/08/2020   Procedure: ARTHROSCOPY KNEE;  Surgeon: Mardee Lynwood SHAUNNA, MD;  Location: [facility];  Service: Orthopedics;  Laterality: Right;   KNEE ARTHROSCOPY Right 10/21/2020   Procedure: Arthroscopic irrigation and debridement right knee;  Surgeon: Mardee Lynwood SHAUNNA, MD;  Location: [facility];  Service: Orthopedics;  Laterality: Right;    Family History  Problem Relation Age of Onset   COPD Father    Hypertension Mother    Aneurysm Mother    Hypertension Brother    Stroke Brother    Cancer Brother        pancreatic   Healthy Brother    Social History   Occupational History   Occupation: Retired  Tobacco Use   Smoking status: Never   Smokeless tobacco: Never   Tobacco comments:    smoking cessation materials not required  Vaping Use   Vaping status: Never Used  Substance and Sexual Activity   Alcohol use: No    Alcohol/week: 0.0 standard drinks of alcohol   Drug use: No   Sexual activity: Not Currently    Partners: Male   Tobacco Counseling Counseling given: Not Answered Tobacco comments: smoking cessation materials not required  SDOH Screenings   Food Insecurity: No Food Insecurity (10/31/2023)  Housing: Unknown (10/31/2023)  Transportation Needs: No Transportation Needs (10/31/2023)  Utilities: Not At Risk (10/31/2023)  Alcohol Screen: Low Risk  (05/06/2023)  Depression (PHQ2-9): Low Risk  (05/04/2024)  Financial Resource Strain: Low Risk  (10/31/2023)  Physical Activity: Insufficiently Active (05/06/2023)  Social Connections: Moderately Integrated (10/31/2023)  Stress: No Stress Concern Present (10/31/2023)  Tobacco Use: Low Risk  (05/04/2024)  Health Literacy: Adequate Health Literacy (10/31/2023)   See flowsheets for full screening details  Depression Screen PHQ 2 & 9 Depression Scale- Over the past 2 weeks, how often have you been bothered by  any of the following problems? Little interest or pleasure in doing things: 0 Feeling down, depressed, or hopeless (PHQ Adolescent also includes...irritable): 0 PHQ-2 Total Score: 0 Trouble falling or staying asleep, or sleeping too much: 0 Feeling tired or having little energy: 0 Poor appetite or overeating (PHQ Adolescent also includes...weight loss): 0 Feeling bad about yourself - or that you are a failure or have let yourself or your family down: 0 Trouble  concentrating on things, such as reading the newspaper or watching television (PHQ Adolescent also includes...like school work): 0 Moving or speaking so slowly that other people could have noticed. Or the opposite - being so fidgety or restless that you have been moving around a lot more than usual: 0 Thoughts that you would be better off dead, or of hurting yourself in some way: 0 PHQ-9 Total Score: 0 If you checked off any problems, how difficult have these problems made it for you to do your work, take care of things at home, or get along with other people?: Not difficult at all     Goals Addressed   None    Fall Screening Falls in the past year?: 0 Number of falls in past year: 0 Was there an injury with Fall?: 0 Fall Risk Category Calculator: 0 Patient Fall Risk Level: Low Fall Risk  Fall Risk Patient at Risk for Falls Due to: No Fall Risks Fall risk Follow up: Falls evaluation completed  Cognitive Assessment What year is it?: 0 points What month is it?: 0 points Give patient an address phrase to remember (5 components): [address], [place], [place]  [address] About what time is it?: 0 points Count backwards from 20 to 1: 0 points Say the months of the year in reverse: 0 points Repeat the address phrase from earlier: 2 points (missed zip code by one number) 6 CIT Score: 2 points  Advance Directives (For Healthcare) Does Patient Have a Medical Advance Directive?: No Would patient like information on creating a medical advance directive?: No - Patient declined        Objective:    There were no vitals filed for this visit. There is no height or weight on file to calculate BMI.  Current Medications (verified) Outpatient Encounter Medications as of 05/17/2024  Medication Sig   acetaminophen  (TYLENOL ) 650 MG CR tablet Take 650 mg by mouth every 8 (eight) hours as needed for pain.   albuterol  (VENTOLIN  HFA) 108 (90 Base) MCG/ACT inhaler Inhale 2 puffs into the lungs every 6  (six) hours as needed for wheezing or shortness of breath.   amLODipine  (NORVASC ) 2.5 MG tablet Take 1-2 tablets (2.5-5 mg total) by mouth in the morning and at bedtime. 2 tablets in am and one in pm   apixaban  (ELIQUIS ) 5 MG TABS tablet TAKE 1 TABLET(5 MG) BY MOUTH TWICE DAILY   budesonide -formoterol  (SYMBICORT ) 160-4.5 MCG/ACT inhaler Inhale 2 puffs into the lungs 2 (two) times daily.   Cholecalciferol (VITAMIN D ) 50 MCG (2000 UT) CAPS Take 1 capsule by mouth daily at 12 noon.   diltiazem  (CARDIZEM ) 30 MG tablet Take 1 tablet (30 mg total) by mouth every 6 (six) hours as needed (for tachycardia/recurrent Afib.).   promethazine  (PHENERGAN ) 12.5 MG tablet Take 0.5 tablets (6.25 mg total) by mouth every 6 (six) hours as needed for up to 5 days for nausea or vomiting.   rosuvastatin  (CRESTOR ) 10 MG tablet Take 1 tablet (10 mg total) by mouth daily.   No facility-administered encounter medications on  file as of 05/17/2024.   Hearing/Vision screen No results found. Immunizations and Health Maintenance Health Maintenance  Topic Date Due   COVID-19 Vaccine (8 - 2025-26 season) 02/27/2024   Medicare Annual Wellness (AWV)  05/05/2024   Colonoscopy  04/01/2025   Mammogram  04/18/2025   DTaP/Tdap/Td (3 - Td or Tdap) 02/04/2031   Pneumococcal Vaccine: 50+ Years  Completed   Influenza Vaccine  Completed   Bone Density Scan  Completed   Zoster Vaccines- Shingrix  Completed   Meningococcal B Vaccine  Aged Out   Hepatitis C Screening  Discontinued        Assessment/Plan:  This is a routine wellness examination for Kathleena.  Patient Care Team: Sowles, Krichna, MD as PCP - General (Family Medicine) Darron Deatrice LABOR, MD as Consulting Physician (Cardiology) Maree Jannett POUR, MD as Consulting Physician (Neurology) Jinny Carmine, MD as Consulting Physician (Gastroenterology) Karoline Lima, RN as Case Manager Elma Zachary RAMAN [facility])  I have personally reviewed and noted the following in the  patient's chart:   Medical and social history Use of alcohol, tobacco or illicit drugs  Current medications and supplements including opioid prescriptions. Functional ability and status Nutritional status Physical activity Advanced directives List of other physicians Hospitalizations, surgeries, and ER visits in previous 12 months Vitals Screenings to include cognitive, depression, and falls Referrals and appointments  No orders of the defined types were placed in this encounter.  In addition, I have reviewed and discussed with patient certain preventive protocols, quality metrics, and best practice recommendations. A written personalized care plan for preventive services as well as general preventive health recommendations were provided to patient.   Jhonnie RAMAN Das, LPN   88/79/7974   No follow-ups on file.  After Visit Summary: (MyChart) Due to this being a telephonic visit, the after visit summary with patients personalized plan was offered to patient via MyChart   Nurse Notes: UTD ON SHOTS; MAMMOGRAM & BDS UTD; HAS COLONOSCOPY SCHEDULED NEXT WEEK

## 2024-05-18 NOTE — Telephone Encounter (Signed)
 Patient with diagnosis of atrial fibrillation on Eliquis  for anticoagulation.    Procedure:  Colonoscopy   Date of Surgery:  Clearance 05/22/24      CHA2DS2-VASc Score = 7   This indicates a 11.2% annual risk of stroke. The patient's score is based upon: CHF History: 0 HTN History: 1 Diabetes History: 0 Stroke History: 2 Vascular Disease History: 1 Age Score: 2 Gender Score: 1   Chart indicates stroke occurred in 2019  CrCl 58 Platelet count 320  Patient has not had an Afib/aflutter ablation in the last 3 months, DCCV within the last 4 weeks or a watchman implanted in the last 45 days   Per office protocol, patient can hold Eliquis  for 2 days prior to procedure.   Patient will not need bridging with Lovenox (enoxaparin) around procedure.  **This guidance is not considered finalized until pre-operative APP has relayed final recommendations.**

## 2024-05-18 NOTE — Telephone Encounter (Signed)
   Patient Name: [name]  DOB: 11-03-1940 MRN: [id number]  Primary Cardiologist: None  Clinical pharmacists have reviewed the patient's past medical history, labs, and current medications as part of preoperative protocol coverage. The following recommendations have been made:  Patient with diagnosis of atrial fibrillation on Eliquis  for anticoagulation.     Procedure:  Colonoscopy   Date of Surgery:  Clearance 05/22/24        CHA2DS2-VASc Score = 7   This indicates a 11.2% annual risk of stroke. The patient's score is based upon: CHF History: 0 HTN History: 1 Diabetes History: 0 Stroke History: 2 Vascular Disease History: 1 Age Score: 2 Gender Score: 1   Chart indicates stroke occurred in 2019   CrCl 58 Platelet count 320   Patient has not had an Afib/aflutter ablation in the last 3 months, DCCV within the last 4 weeks or a watchman implanted in the last 45 days    Per office protocol, patient can hold Eliquis  for 2 days prior to procedure.   Patient will not need bridging with Lovenox (enoxaparin) around procedure.       I will route this recommendation to the requesting party via Epic fax function and remove from pre-op pool.  Please call with questions.  Lamarr Satterfield, NP 05/18/2024, 10:09 AM

## 2024-05-22 ENCOUNTER — Encounter: Payer: Self-pay | Admitting: Gastroenterology

## 2024-05-22 ENCOUNTER — Encounter
Admission: RE | Disposition: A | Discharge status: Home / Self Care | Payer: Self-pay | Source: Home / Self Care | Attending: Gastroenterology

## 2024-05-22 ENCOUNTER — Telehealth: Payer: Self-pay

## 2024-05-22 ENCOUNTER — Ambulatory Visit: Admitting: Anesthesiology

## 2024-05-22 ENCOUNTER — Ambulatory Visit
Admission: RE | Admit: 2024-05-22 | Discharge: 2024-05-22 | Disposition: A | Discharge status: Home / Self Care | Attending: Gastroenterology | Admitting: Gastroenterology

## 2024-05-22 ENCOUNTER — Other Ambulatory Visit: Payer: Self-pay

## 2024-05-22 DIAGNOSIS — K5730 Diverticulosis of large intestine without perforation or abscess without bleeding: Secondary | ICD-10-CM | POA: Diagnosis not present

## 2024-05-22 DIAGNOSIS — I4891 Unspecified atrial fibrillation: Secondary | ICD-10-CM | POA: Diagnosis not present

## 2024-05-22 DIAGNOSIS — G4730 Sleep apnea, unspecified: Secondary | ICD-10-CM | POA: Diagnosis not present

## 2024-05-22 DIAGNOSIS — Z7901 Long term (current) use of anticoagulants: Secondary | ICD-10-CM | POA: Insufficient documentation

## 2024-05-22 DIAGNOSIS — D124 Benign neoplasm of descending colon: Secondary | ICD-10-CM | POA: Insufficient documentation

## 2024-05-22 DIAGNOSIS — K641 Second degree hemorrhoids: Secondary | ICD-10-CM | POA: Insufficient documentation

## 2024-05-22 DIAGNOSIS — I10 Essential (primary) hypertension: Secondary | ICD-10-CM | POA: Diagnosis not present

## 2024-05-22 DIAGNOSIS — D122 Benign neoplasm of ascending colon: Secondary | ICD-10-CM | POA: Insufficient documentation

## 2024-05-22 DIAGNOSIS — K635 Polyp of colon: Secondary | ICD-10-CM

## 2024-05-22 DIAGNOSIS — K921 Melena: Secondary | ICD-10-CM | POA: Diagnosis present

## 2024-05-22 DIAGNOSIS — K625 Hemorrhage of anus and rectum: Secondary | ICD-10-CM

## 2024-05-22 DIAGNOSIS — Z8673 Personal history of transient ischemic attack (TIA), and cerebral infarction without residual deficits: Secondary | ICD-10-CM | POA: Insufficient documentation

## 2024-05-22 HISTORY — PX: POLYPECTOMY: SHX149

## 2024-05-22 HISTORY — PX: COLONOSCOPY: SHX5424

## 2024-05-22 MED ORDER — PROPOFOL 10 MG/ML IV BOLUS
INTRAVENOUS | Status: DC | PRN
  Administered 2024-05-22 (×2): 50 mg via INTRAVENOUS

## 2024-05-22 MED ORDER — LIDOCAINE HCL (CARDIAC) PF 100 MG/5ML IV SOSY
PREFILLED_SYRINGE | INTRAVENOUS | Status: DC | PRN
  Administered 2024-05-22: 60 mg via INTRAVENOUS

## 2024-05-22 MED ORDER — SODIUM CHLORIDE 0.9 % IV SOLN: INTRAVENOUS | Status: DC

## 2024-05-22 MED ORDER — PROPOFOL 500 MG/50ML IV EMUL
INTRAVENOUS | Status: DC | PRN
  Administered 2024-05-22: 75 ug/kg/min via INTRAVENOUS

## 2024-05-22 MED ORDER — DEXMEDETOMIDINE HCL IN NACL 80 MCG/20ML IV SOLN
INTRAVENOUS | Status: DC | PRN
  Administered 2024-05-22: 8 ug via INTRAVENOUS
  Administered 2024-05-22: 12 ug via INTRAVENOUS

## 2024-05-22 NOTE — H&P (Signed)
 Rogelia Copping, MD [facility] [address], [address] [place], [place] [address] Phone:[phone] Fax : [phone]  Primary Care Physician:  Sowles, Krichna, MD Primary Gastroenterologist:  Dr. Copping  Pre-Procedure History & Physical: HPI:  [name] is a 83 y.o. female is here for an colonoscopy.   Past Medical History:  Diagnosis Date   History of stress test    a. 02/2013 Nl stress test.   Hypertension    Insomnia, unspecified    Mitral regurgitation    a. echo 01/2016: nl LV sys fxn, mild MR, w/o pulm htn, nl atrial size   Osteoporosis, unspecified    Paroxysmal atrial fibrillation (HCC) 01/2016   a. diagnosed 01/2016; b. has been on eliquis , pradaxa , and xarelto  at varying times 2/2 cost of each medication-->currently on eliquis  (11/2016)   Pure hypercholesterolemia    Sleep apnea    Unable to tolerate nocturnal PAP therapy   TIA (transient ischemic attack)    Unspecified asthma(493.90)    Vertigo     Past Surgical History:  Procedure Laterality Date   ABDOMINAL HYSTERECTOMY     CATARACT EXTRACTION Bilateral 08/31/2016   COLONOSCOPY WITH PROPOFOL  N/A 03/04/2015   Procedure: COLONOSCOPY WITH PROPOFOL ;  Surgeon: Rogelia Copping, MD;  Location: [facility];  Service: Endoscopy;  Laterality: N/A;   COLONOSCOPY WITH PROPOFOL  N/A 04/01/2020   Procedure: COLONOSCOPY WITH PROPOFOL ;  Surgeon: Copping Rogelia, MD;  Location: [facility];  Service: Endoscopy;  Laterality: N/A;   DIAGNOSTIC LAPAROSCOPY     REMOVAL OF SCAR TISSUE (ADHESIONS)   KNEE ARTHROPLASTY Right 08/14/2021   Procedure: COMPUTER ASSISTED TOTAL KNEE ARTHROPLASTY - RNFA;  Surgeon: Mardee Lynwood SHAUNNA, MD;  Location: [facility];  Service: Orthopedics;  Laterality: Right;   KNEE ARTHROSCOPY Right 10/08/2020   Procedure: ARTHROSCOPY KNEE;  Surgeon: Mardee Lynwood SHAUNNA, MD;  Location: [facility];  Service: Orthopedics;  Laterality: Right;   KNEE ARTHROSCOPY Right 10/21/2020   Procedure: Arthroscopic irrigation and debridement  right knee;  Surgeon: Mardee Lynwood SHAUNNA, MD;  Location: [facility];  Service: Orthopedics;  Laterality: Right;    Prior to Admission medications   Medication Sig Start Date End Date Taking? Authorizing Provider  amLODipine  (NORVASC ) 2.5 MG tablet Take 1-2 tablets (2.5-5 mg total) by mouth in the morning and at bedtime. 2 tablets in am and one in pm 05/04/24  Yes Sowles, Krichna, MD  budesonide -formoterol  (SYMBICORT ) 160-4.5 MCG/ACT inhaler Inhale 2 puffs into the lungs 2 (two) times daily. Patient taking differently: Inhale 2 puffs into the lungs 2 (two) times daily. TAKES PRN 05/04/24  Yes Sowles, Krichna, MD  Cholecalciferol (VITAMIN D ) 50 MCG (2000 UT) CAPS Take 1 capsule by mouth daily at 12 noon.   Yes [provider]  diltiazem  (CARDIZEM ) 30 MG tablet Take 1 tablet (30 mg total) by mouth every 6 (six) hours as needed (for tachycardia/recurrent Afib.). 02/16/16  Yes Vivienne Lonni Ingle, NP  acetaminophen  (TYLENOL ) 650 MG CR tablet Take 650 mg by mouth every 8 (eight) hours as needed for pain.    [provider]  albuterol  (VENTOLIN  HFA) 108 (90 Base) MCG/ACT inhaler Inhale 2 puffs into the lungs every 6 (six) hours as needed for wheezing or shortness of breath. 05/04/23   Sowles, Krichna, MD  apixaban  (ELIQUIS ) 5 MG TABS tablet TAKE 1 TABLET(5 MG) BY MOUTH TWICE DAILY 02/06/24   Darron Deatrice LABOR, MD  promethazine  (PHENERGAN ) 12.5 MG tablet Take 0.5 tablets (6.25 mg total) by mouth every 6 (six) hours as needed for  up to 5 days for nausea or vomiting. 01/11/24 05/17/24  Sowles, Krichna, MD  rosuvastatin  (CRESTOR ) 10 MG tablet Take 1 tablet (10 mg total) by mouth daily. 10/16/23   Sowles, Krichna, MD    Allergies as of 04/18/2024 - Review Complete 04/16/2024  Allergen Reaction Noted   Ace inhibitors Swelling 02/15/2013   Penicillins Anxiety 02/15/2013    Family History  Problem Relation Age of Onset   COPD Father    Hypertension Mother    Aneurysm Mother    Hypertension  Brother    Stroke Brother    Cancer Brother        pancreatic   Healthy Brother     Social History   Socioeconomic History   Marital status: Single    Spouse name: Not on file   Number of children: 2   Years of education: Not on file   Highest education level: Associate degree: academic program  Occupational History   Occupation: Retired  Tobacco Use   Smoking status: Never   Smokeless tobacco: Never   Tobacco comments:    smoking cessation materials not required  Vaping Use   Vaping status: Never Used  Substance and Sexual Activity   Alcohol use: No    Alcohol/week: 0.0 standard drinks of alcohol   Drug use: No   Sexual activity: Not Currently    Partners: Male  Other Topics Concern   Not on file  Social History Narrative   Independent at baseline. Lives alone.    Social Drivers of Corporate Investment Banker Strain: Low Risk  (10/31/2023)   Overall Financial Resource Strain (CARDIA)    Difficulty of Paying Living Expenses: Not very hard  Food Insecurity: No Food Insecurity (05/17/2024)   Hunger Vital Sign    Worried About Running Out of Food in the Last Year: Never true    Ran Out of Food in the Last Year: Never true  Transportation Needs: No Transportation Needs (05/17/2024)   PRAPARE - Administrator, Civil Service (Medical): No    Lack of Transportation (Non-Medical): No  Physical Activity: Insufficiently Active (05/17/2024)   Exercise Vital Sign    Days of Exercise per Week: 3 days    Minutes of Exercise per Session: 30 min  Stress: No Stress Concern Present (05/17/2024)   [employer] of Occupational Health - Occupational Stress Questionnaire    Feeling of Stress: Not at all  Social Connections: Moderately Integrated (05/17/2024)   Social Connection and Isolation Panel    Frequency of Communication with Friends and Family: More than three times a week    Frequency of Social Gatherings with Friends and Family: Twice a week    Attends  Religious Services: More than 4 times per year    Active Member of [employer] or Organizations: Yes    Attends Engineer, Structural: More than 4 times per year    Marital Status: Divorced  Intimate Partner Violence: Not At Risk (05/17/2024)   Humiliation, Afraid, Rape, and Kick questionnaire    Fear of Current or Ex-Partner: No    Emotionally Abused: No    Physically Abused: No    Sexually Abused: No    Review of Systems: See HPI, otherwise negative ROS  Physical Exam: BP (!) 144/75   Pulse (!) 50   Temp (!) 96.8 F (36 C) (Temporal)   Resp 16   Ht 5' 3 (1.6 m)   Wt 78 kg   SpO2 100%   BMI 30.47  kg/m  General:   Alert,  pleasant and cooperative in NAD Head:  Normocephalic and atraumatic. Neck:  Supple; no masses or thyromegaly. Lungs:  Clear throughout to auscultation.    Heart:  Regular rate and rhythm. Abdomen:  Soft, nontender and nondistended. Normal bowel sounds, without guarding, and without rebound.   Neurologic:  Alert and  oriented x4;  grossly normal neurologically.  Impression/Plan: Naomie SHAUNNA Fess is here for an colonoscopy to be performed for rectal bleeding  Risks, benefits, limitations, and alternatives regarding  colonoscopy have been reviewed with the patient.  Questions have been answered.  All parties agreeable.   Rogelia Copping, MD  05/22/2024, 8:29 AM

## 2024-05-22 NOTE — Anesthesia Preprocedure Evaluation (Signed)
 Anesthesia Evaluation  Patient identified by MRN, date of birth, ID band Patient awake    Reviewed: Allergy & Precautions, H&P , NPO status , Patient's Chart, lab work & pertinent test results  Airway Mallampati: III  TM Distance: >3 FB Neck ROM: full    Dental no notable dental hx.    Pulmonary sleep apnea    Pulmonary exam normal        Cardiovascular Exercise Tolerance: Good hypertension, Normal cardiovascular exam+ dysrhythmias Atrial Fibrillation      Neuro/Psych TIA negative psych ROS   GI/Hepatic negative GI ROS, Neg liver ROS,,,  Endo/Other  negative endocrine ROS    Renal/GU negative Renal ROS  negative genitourinary   Musculoskeletal   Abdominal  (+) + obese  Peds  Hematology negative hematology ROS (+)   Anesthesia Other Findings Past Medical History: No date: History of stress test     Comment:  a. 02/2013 Nl stress test. No date: Hypertension No date: Insomnia, unspecified No date: Mitral regurgitation     Comment:  a. echo 01/2016: nl LV sys fxn, mild MR, w/o pulm htn, nl              atrial size No date: Osteoporosis, unspecified 01/2016: Paroxysmal atrial fibrillation (HCC)     Comment:  a. diagnosed 01/2016; b. has been on eliquis , pradaxa ,               and xarelto  at varying times 2/2 cost of each               medication-->currently on eliquis  (11/2016) No date: Pure hypercholesterolemia No date: Sleep apnea     Comment:  Unable to tolerate nocturnal PAP therapy No date: TIA (transient ischemic attack) No date: Unspecified asthma(493.90) No date: Vertigo  Past Surgical History: No date: ABDOMINAL HYSTERECTOMY 08/31/2016: CATARACT EXTRACTION; Bilateral 03/04/2015: COLONOSCOPY WITH PROPOFOL ; N/A     Comment:  Procedure: COLONOSCOPY WITH PROPOFOL ;  Surgeon: Rogelia Copping, MD;  Location: [facility];  Service: Endoscopy;              Laterality: N/A; 04/01/2020: COLONOSCOPY  WITH PROPOFOL ; N/A     Comment:  Procedure: COLONOSCOPY WITH PROPOFOL ;  Surgeon: Copping Rogelia, MD;  Location: [facility];  Service:               Endoscopy;  Laterality: N/A; No date: DIAGNOSTIC LAPAROSCOPY     Comment:  REMOVAL OF SCAR TISSUE (ADHESIONS) 08/14/2021: KNEE ARTHROPLASTY; Right     Comment:  Procedure: COMPUTER ASSISTED TOTAL KNEE ARTHROPLASTY -               RNFA;  Surgeon: Mardee Lynwood SQUIBB, MD;  Location: [facility];              Service: Orthopedics;  Laterality: Right; 10/08/2020: KNEE ARTHROSCOPY; Right     Comment:  Procedure: ARTHROSCOPY KNEE;  Surgeon: Mardee Lynwood SQUIBB,               MD;  Location: [facility];  Service: Orthopedics;                Laterality: Right; 10/21/2020: KNEE ARTHROSCOPY; Right     Comment:  Procedure: Arthroscopic irrigation and debridement right              knee;  Surgeon: Mardee Lynwood SQUIBB, MD;  Location: [facility];              Service: Orthopedics;  Laterality: Right;  BMI    Body Mass Index: 30.47 kg/m      Reproductive/Obstetrics negative OB ROS                              Anesthesia Physical Anesthesia Plan  ASA: 3  Anesthesia Plan: General   Post-op Pain Management: Minimal or no pain anticipated   Induction: Intravenous  PONV Risk Score and Plan: Propofol  infusion and TIVA  Airway Management Planned: Natural Airway  Additional Equipment:   Intra-op Plan:   Post-operative Plan:   Informed Consent: I have reviewed the patients History and Physical, chart, labs and discussed the procedure including the risks, benefits and alternatives for the proposed anesthesia with the patient or authorized representative who has indicated his/her understanding and acceptance.     Dental Advisory Given  Plan Discussed with: CRNA and Surgeon  Anesthesia Plan Comments:          Anesthesia Quick Evaluation

## 2024-05-22 NOTE — Transfer of Care (Signed)
 Immediate Anesthesia Transfer of Care Note  Patient: [name]  Procedure(s) Performed: COLONOSCOPY POLYPECTOMY, INTESTINE  Patient Location: PACU  Anesthesia Type:General  Level of Consciousness: sedated  Airway & Oxygen Therapy: Patient Spontanous Breathing  Post-op Assessment: Report given to RN and Post -op Vital signs reviewed and stable  Post vital signs: Reviewed and stable  Last Vitals:  Vitals Value Taken Time  BP 100/81 05/22/24 09:51  Temp 35.6 C 05/22/24 09:51  Pulse 62 05/22/24 09:54  Resp 17 05/22/24 09:54  SpO2 96 % 05/22/24 09:54  Vitals shown include unfiled device data.  Last Pain:  Vitals:   05/22/24 0951  TempSrc: Temporal  PainSc:          Complications: No notable events documented.

## 2024-05-22 NOTE — Op Note (Signed)
 [facility] Patient Name: [name] Procedure Date: 05/22/2024 9:14 AM MRN: [id number] Account #: [id number] Date of Birth: 02-17-1941 Admit Type: Outpatient Age: 83 Room: [facility] ENDO ROOM 4 Gender: Female Note Status: Finalized Instrument Name: Colon Scope [id number] Procedure:             Colonoscopy Indications:           Hematochezia Providers:             Rogelia Copping MD, MD Referring MD:          Dorette FALCON. Sowles, MD (Referring MD) Medicines:             Propofol  per Anesthesia Complications:         No immediate complications. Procedure:             Pre-Anesthesia Assessment:                        - Prior to the procedure, a History and Physical was                         performed, and patient medications and allergies were                         reviewed. The patient's tolerance of previous                         anesthesia was also reviewed. The risks and benefits                         of the procedure and the sedation options and risks                         were discussed with the patient. All questions were                         answered, and informed consent was obtained. Prior                         Anticoagulants: The patient has taken no anticoagulant                         or antiplatelet agents. ASA Grade Assessment: II - A                         patient with mild systemic disease. After reviewing                         the risks and benefits, the patient was deemed in                         satisfactory condition to undergo the procedure.                        After obtaining informed consent, the colonoscope was                         passed under direct vision. Throughout the procedure,  the patient's blood pressure, pulse, and oxygen                         saturations were monitored continuously. The                         Colonoscope was introduced through the anus and                          advanced to the the cecum, identified by appendiceal                         orifice and ileocecal valve. The colonoscopy was                         performed without difficulty. The patient tolerated                         the procedure well. The quality of the bowel                         preparation was excellent. Findings:      The perianal and digital rectal examinations were normal.      A 5 mm polyp was found in the ascending colon. The polyp was sessile.       The polyp was removed with a cold snare. Resection and retrieval were       complete.      A 5 mm polyp was found in the descending colon. The polyp was sessile.       The polyp was removed with a cold snare. Resection and retrieval were       complete.      A few small-mouthed diverticula were found in the sigmoid colon.      Non-bleeding internal hemorrhoids were found during retroflexion. The       hemorrhoids were Grade II (internal hemorrhoids that prolapse but reduce       spontaneously). Impression:            - One 5 mm polyp in the ascending colon, removed with                         a cold snare. Resected and retrieved.                        - One 5 mm polyp in the descending colon, removed with                         a cold snare. Resected and retrieved.                        - Diverticulosis in the sigmoid colon.                        - Non-bleeding internal hemorrhoids. Recommendation:        - Discharge patient to home.                        - Continue present medications.                        -  Await pathology results.                        - High fiber diet. Procedure Code(s):     --- Professional ---                        [phone], Colonoscopy, flexible; with removal of                         tumor(s), polyp(s), or other lesion(s) by snare                         technique Diagnosis Code(s):     --- Professional ---                        K92.1, Melena (includes Hematochezia)                         D12.4, Benign neoplasm of descending colon CPT copyright 2022 [employer]. All rights reserved. The codes documented in this report are preliminary and upon coder review may  be revised to meet current compliance requirements. Rogelia Copping MD, MD 05/22/2024 9:48:35 AM This report has been signed electronically. Number of Addenda: 0 Note Initiated On: 05/22/2024 9:14 AM Scope Withdrawal Time: 0 hours 9 minutes 27 seconds  Total Procedure Duration: 0 hours 15 minutes 52 seconds  Estimated Blood Loss:  Estimated blood loss: none.      [facility]

## 2024-05-23 ENCOUNTER — Ambulatory Visit: Payer: Self-pay | Admitting: Gastroenterology

## 2024-05-23 ENCOUNTER — Encounter: Payer: Self-pay | Admitting: Gastroenterology

## 2024-05-23 LAB — SURGICAL PATHOLOGY

## 2024-05-23 NOTE — Anesthesia Postprocedure Evaluation (Signed)
 Anesthesia Post Note  Patient: [name]  Procedure(s) Performed: COLONOSCOPY POLYPECTOMY, INTESTINE  Patient location during evaluation: Endoscopy Anesthesia Type: General Level of consciousness: awake and alert Pain management: pain level controlled Vital Signs Assessment: post-procedure vital signs reviewed and stable Respiratory status: spontaneous breathing, nonlabored ventilation and respiratory function stable Cardiovascular status: blood pressure returned to baseline and stable Postop Assessment: no apparent nausea or vomiting Anesthetic complications: no   No notable events documented.   Last Vitals:  Vitals:   05/22/24 0951 05/22/24 1000  BP: 100/81 (!) 91/54  Pulse: (!) 51 (!) 43  Resp: 17 20  Temp: (!) 35.6 C   SpO2: 97% 96%    Last Pain:  Vitals:   05/23/24 0736  TempSrc:   PainSc: 0-No pain                 Camellia Merilee Louder

## 2024-06-06 ENCOUNTER — Encounter: Payer: Self-pay | Admitting: Gastroenterology

## 2024-07-13 ENCOUNTER — Ambulatory Visit: Admitting: Family Medicine

## 2024-08-24 ENCOUNTER — Ambulatory Visit: Admitting: Physician Assistant

## 2024-11-01 ENCOUNTER — Ambulatory Visit: Admitting: Family Medicine

## 2025-05-30 ENCOUNTER — Ambulatory Visit
